# Patient Record
Sex: Female | Born: 1960 | Race: White | Hispanic: No | Marital: Married | Smoking: Former smoker
Health system: Southern US, Community
[De-identification: ages and names within clinical notes are randomized; demographics above are authoritative.]

## PROBLEM LIST (undated history)

## (undated) DIAGNOSIS — I10 Essential (primary) hypertension: Secondary | ICD-10-CM

## (undated) DIAGNOSIS — K5790 Diverticulosis of intestine, part unspecified, without perforation or abscess without bleeding: Secondary | ICD-10-CM

## (undated) DIAGNOSIS — K219 Gastro-esophageal reflux disease without esophagitis: Secondary | ICD-10-CM

## (undated) DIAGNOSIS — C439 Malignant melanoma of skin, unspecified: Secondary | ICD-10-CM

## (undated) DIAGNOSIS — Z9089 Acquired absence of other organs: Secondary | ICD-10-CM

## (undated) DIAGNOSIS — E785 Hyperlipidemia, unspecified: Secondary | ICD-10-CM

## (undated) DIAGNOSIS — E079 Disorder of thyroid, unspecified: Secondary | ICD-10-CM

## (undated) DIAGNOSIS — D649 Anemia, unspecified: Secondary | ICD-10-CM

## (undated) HISTORY — PX: KNEE ARTHROSCOPY W/ MENISCAL REPAIR: SHX1877

## (undated) HISTORY — DX: Essential (primary) hypertension: I10

## (undated) HISTORY — DX: Disorder of thyroid, unspecified: E07.9

## (undated) HISTORY — DX: Hemochromatosis, unspecified: E83.119

## (undated) HISTORY — DX: Anemia, unspecified: D64.9

## (undated) HISTORY — DX: Diverticulosis of intestine, part unspecified, without perforation or abscess without bleeding: K57.90

## (undated) HISTORY — DX: Acquired absence of other organs: Z90.89

## (undated) HISTORY — PX: TONSILLECTOMY AND ADENOIDECTOMY: SHX28

## (undated) HISTORY — DX: Gastro-esophageal reflux disease without esophagitis: K21.9

## (undated) HISTORY — DX: Malignant melanoma of skin, unspecified: C43.9

## (undated) HISTORY — DX: Hyperlipidemia, unspecified: E78.5

---

## 2002-04-01 ENCOUNTER — Encounter: Payer: Self-pay | Admitting: Gastroenterology

## 2002-04-01 ENCOUNTER — Ambulatory Visit (HOSPITAL_COMMUNITY): Admission: RE | Admit: 2002-04-01 | Discharge: 2002-04-01 | Payer: Self-pay | Admitting: Gastroenterology

## 2002-07-31 HISTORY — PX: BREAST BIOPSY: SHX20

## 2003-04-24 ENCOUNTER — Encounter (INDEPENDENT_AMBULATORY_CARE_PROVIDER_SITE_OTHER): Payer: Self-pay | Admitting: *Deleted

## 2003-04-24 ENCOUNTER — Ambulatory Visit (HOSPITAL_BASED_OUTPATIENT_CLINIC_OR_DEPARTMENT_OTHER): Admission: RE | Admit: 2003-04-24 | Discharge: 2003-04-24 | Payer: Self-pay | Admitting: Surgery

## 2003-04-24 ENCOUNTER — Ambulatory Visit (HOSPITAL_COMMUNITY): Admission: RE | Admit: 2003-04-24 | Discharge: 2003-04-24 | Payer: Self-pay | Admitting: Surgery

## 2004-08-12 ENCOUNTER — Encounter: Admission: RE | Admit: 2004-08-12 | Discharge: 2004-08-12 | Payer: Self-pay | Admitting: Family Medicine

## 2006-04-13 ENCOUNTER — Ambulatory Visit: Payer: Self-pay | Admitting: Hematology & Oncology

## 2006-04-23 LAB — CBC WITH DIFFERENTIAL/PLATELET
BASO%: 1.4 % (ref 0.0–2.0)
Eosinophils Absolute: 0.3 10*3/uL (ref 0.0–0.5)
LYMPH%: 28.7 % (ref 14.0–48.0)
MCHC: 33.6 g/dL (ref 32.0–36.0)
MCV: 79.7 fL — ABNORMAL LOW (ref 81.0–101.0)
MONO%: 8.6 % (ref 0.0–13.0)
NEUT%: 56.9 % (ref 39.6–76.8)
Platelets: 258 10*3/uL (ref 145–400)
RBC: 4.54 10*6/uL (ref 3.70–5.32)

## 2006-04-23 LAB — FERRITIN: Ferritin: 14 ng/mL (ref 10–291)

## 2006-04-23 LAB — IRON AND TIBC: %SAT: 13 % — ABNORMAL LOW (ref 20–55)

## 2006-07-17 ENCOUNTER — Ambulatory Visit: Payer: Self-pay | Admitting: Hematology & Oncology

## 2006-07-20 LAB — CBC WITH DIFFERENTIAL/PLATELET
Basophils Absolute: 0 10*3/uL (ref 0.0–0.1)
EOS%: 3.9 % (ref 0.0–7.0)
HCT: 36.5 % (ref 34.8–46.6)
HGB: 12.3 g/dL (ref 11.6–15.9)
MCH: 26 pg (ref 26.0–34.0)
MCV: 77.4 fL — ABNORMAL LOW (ref 81.0–101.0)
MONO%: 5.2 % (ref 0.0–13.0)
NEUT%: 63.6 % (ref 39.6–76.8)
Platelets: 216 10*3/uL (ref 145–400)

## 2006-10-23 ENCOUNTER — Ambulatory Visit: Payer: Self-pay | Admitting: Hematology & Oncology

## 2006-10-25 LAB — CBC WITH DIFFERENTIAL/PLATELET
BASO%: 0.4 % (ref 0.0–2.0)
Eosinophils Absolute: 0.5 10*3/uL (ref 0.0–0.5)
HCT: 36.1 % (ref 34.8–46.6)
LYMPH%: 30.3 % (ref 14.0–48.0)
MCHC: 34.1 g/dL (ref 32.0–36.0)
MONO#: 0.4 10*3/uL (ref 0.1–0.9)
NEUT#: 3.5 10*3/uL (ref 1.5–6.5)
NEUT%: 55.1 % (ref 39.6–76.8)
Platelets: 193 10*3/uL (ref 145–400)
RBC: 4.65 10*6/uL (ref 3.70–5.32)
WBC: 6.3 10*3/uL (ref 3.9–10.0)
lymph#: 1.9 10*3/uL (ref 0.9–3.3)

## 2006-10-25 LAB — FERRITIN: Ferritin: 12 ng/mL (ref 10–291)

## 2007-01-15 ENCOUNTER — Ambulatory Visit: Payer: Self-pay | Admitting: Hematology & Oncology

## 2007-01-18 LAB — CBC WITH DIFFERENTIAL/PLATELET
Basophils Absolute: 0 10*3/uL (ref 0.0–0.1)
Eosinophils Absolute: 0.5 10*3/uL (ref 0.0–0.5)
HCT: 34.3 % — ABNORMAL LOW (ref 34.8–46.6)
HGB: 11.7 g/dL (ref 11.6–15.9)
MCH: 26.3 pg (ref 26.0–34.0)
MONO#: 0.4 10*3/uL (ref 0.1–0.9)
NEUT#: 4.5 10*3/uL (ref 1.5–6.5)
NEUT%: 59 % (ref 39.6–76.8)
RDW: 15.3 % — ABNORMAL HIGH (ref 11.3–14.5)
lymph#: 2.2 10*3/uL (ref 0.9–3.3)

## 2007-04-23 ENCOUNTER — Ambulatory Visit: Payer: Self-pay | Admitting: Hematology & Oncology

## 2007-04-25 LAB — CBC WITH DIFFERENTIAL/PLATELET
BASO%: 0.5 % (ref 0.0–2.0)
HCT: 36.2 % (ref 34.8–46.6)
MCHC: 34.3 g/dL (ref 32.0–36.0)
MONO#: 0.3 10*3/uL (ref 0.1–0.9)
NEUT%: 59.4 % (ref 39.6–76.8)
RDW: 15.9 % — ABNORMAL HIGH (ref 11.3–14.5)
WBC: 7.9 10*3/uL (ref 3.9–10.0)
lymph#: 2.3 10*3/uL (ref 0.9–3.3)

## 2007-04-25 LAB — FERRITIN: Ferritin: 7 ng/mL — ABNORMAL LOW (ref 10–291)

## 2007-06-03 ENCOUNTER — Encounter: Payer: Self-pay | Admitting: Endocrinology

## 2007-08-20 ENCOUNTER — Ambulatory Visit: Payer: Self-pay | Admitting: Hematology & Oncology

## 2007-08-22 LAB — CBC WITH DIFFERENTIAL/PLATELET
Basophils Absolute: 0 10*3/uL (ref 0.0–0.1)
Eosinophils Absolute: 0.6 10*3/uL — ABNORMAL HIGH (ref 0.0–0.5)
HGB: 11.8 g/dL (ref 11.6–15.9)
MCV: 74.1 fL — ABNORMAL LOW (ref 81.0–101.0)
MONO#: 0.4 10*3/uL (ref 0.1–0.9)
NEUT#: 4.5 10*3/uL (ref 1.5–6.5)
RDW: 16.1 % — ABNORMAL HIGH (ref 11.3–14.5)
WBC: 8 10*3/uL (ref 3.9–10.0)
lymph#: 2.4 10*3/uL (ref 0.9–3.3)

## 2007-08-22 LAB — IRON AND TIBC
%SAT: 10 % — ABNORMAL LOW (ref 20–55)
Iron: 41 ug/dL — ABNORMAL LOW (ref 42–145)
TIBC: 406 ug/dL (ref 250–470)
UIBC: 365 ug/dL

## 2007-08-22 LAB — FERRITIN: Ferritin: 6 ng/mL — ABNORMAL LOW (ref 10–291)

## 2007-09-26 ENCOUNTER — Encounter: Payer: Self-pay | Admitting: Endocrinology

## 2007-10-03 ENCOUNTER — Encounter: Payer: Self-pay | Admitting: Endocrinology

## 2007-10-11 ENCOUNTER — Ambulatory Visit: Payer: Self-pay | Admitting: Endocrinology

## 2007-10-11 DIAGNOSIS — K219 Gastro-esophageal reflux disease without esophagitis: Secondary | ICD-10-CM | POA: Insufficient documentation

## 2007-10-11 DIAGNOSIS — I10 Essential (primary) hypertension: Secondary | ICD-10-CM | POA: Insufficient documentation

## 2007-10-11 DIAGNOSIS — J42 Unspecified chronic bronchitis: Secondary | ICD-10-CM | POA: Insufficient documentation

## 2007-10-11 DIAGNOSIS — Z9089 Acquired absence of other organs: Secondary | ICD-10-CM | POA: Insufficient documentation

## 2007-10-11 DIAGNOSIS — N390 Urinary tract infection, site not specified: Secondary | ICD-10-CM | POA: Insufficient documentation

## 2007-10-11 DIAGNOSIS — J309 Allergic rhinitis, unspecified: Secondary | ICD-10-CM | POA: Insufficient documentation

## 2007-10-11 DIAGNOSIS — D649 Anemia, unspecified: Secondary | ICD-10-CM | POA: Insufficient documentation

## 2007-10-11 DIAGNOSIS — E785 Hyperlipidemia, unspecified: Secondary | ICD-10-CM | POA: Insufficient documentation

## 2007-10-22 ENCOUNTER — Encounter: Admission: RE | Admit: 2007-10-22 | Discharge: 2007-10-22 | Payer: Self-pay | Admitting: Endocrinology

## 2007-10-25 ENCOUNTER — Telehealth (INDEPENDENT_AMBULATORY_CARE_PROVIDER_SITE_OTHER): Payer: Self-pay | Admitting: *Deleted

## 2007-10-28 ENCOUNTER — Telehealth (INDEPENDENT_AMBULATORY_CARE_PROVIDER_SITE_OTHER): Payer: Self-pay | Admitting: *Deleted

## 2007-10-30 ENCOUNTER — Ambulatory Visit: Payer: Self-pay | Admitting: Endocrinology

## 2007-11-06 ENCOUNTER — Encounter: Admission: RE | Admit: 2007-11-06 | Discharge: 2007-11-06 | Payer: Self-pay | Admitting: Endocrinology

## 2007-11-09 ENCOUNTER — Emergency Department (HOSPITAL_COMMUNITY): Admission: EM | Admit: 2007-11-09 | Discharge: 2007-11-09 | Payer: Self-pay | Admitting: Emergency Medicine

## 2007-11-19 ENCOUNTER — Telehealth (INDEPENDENT_AMBULATORY_CARE_PROVIDER_SITE_OTHER): Payer: Self-pay | Admitting: *Deleted

## 2007-12-19 ENCOUNTER — Ambulatory Visit: Payer: Self-pay | Admitting: Endocrinology

## 2007-12-19 LAB — CONVERTED CEMR LAB: TSH: 0.04 microintl units/mL — ABNORMAL LOW (ref 0.35–5.50)

## 2008-01-17 ENCOUNTER — Ambulatory Visit: Payer: Self-pay | Admitting: Endocrinology

## 2008-01-21 LAB — CONVERTED CEMR LAB
Free T4: 0.4 ng/dL — ABNORMAL LOW (ref 0.6–1.6)
TSH: 17.99 microintl units/mL — ABNORMAL HIGH (ref 0.35–5.50)

## 2008-01-22 ENCOUNTER — Encounter: Payer: Self-pay | Admitting: Endocrinology

## 2008-01-23 ENCOUNTER — Ambulatory Visit: Payer: Self-pay | Admitting: Hematology & Oncology

## 2008-01-27 LAB — CBC WITH DIFFERENTIAL/PLATELET
Eosinophils Absolute: 0.5 10*3/uL (ref 0.0–0.5)
HCT: 32.8 % — ABNORMAL LOW (ref 34.8–46.6)
LYMPH%: 27.9 % (ref 14.0–48.0)
MCHC: 33.1 g/dL (ref 32.0–36.0)
MONO#: 0.3 10*3/uL (ref 0.1–0.9)
NEUT#: 3.5 10*3/uL (ref 1.5–6.5)
NEUT%: 57.6 % (ref 39.6–76.8)
Platelets: 255 10*3/uL (ref 145–400)
WBC: 6.1 10*3/uL (ref 3.9–10.0)

## 2008-02-17 ENCOUNTER — Ambulatory Visit: Payer: Self-pay | Admitting: Endocrinology

## 2008-02-17 DIAGNOSIS — E039 Hypothyroidism, unspecified: Secondary | ICD-10-CM | POA: Insufficient documentation

## 2008-02-17 LAB — CONVERTED CEMR LAB: TSH: 2.43 microintl units/mL (ref 0.35–5.50)

## 2008-04-02 ENCOUNTER — Ambulatory Visit: Payer: Self-pay | Admitting: Endocrinology

## 2008-04-02 LAB — CONVERTED CEMR LAB: TSH: 0.38 microintl units/mL (ref 0.35–5.50)

## 2008-06-02 ENCOUNTER — Encounter: Payer: Self-pay | Admitting: Endocrinology

## 2008-07-03 ENCOUNTER — Ambulatory Visit: Payer: Self-pay | Admitting: Hematology & Oncology

## 2008-07-06 LAB — CBC WITH DIFFERENTIAL (CANCER CENTER ONLY)
BASO#: 0 10*3/uL (ref 0.0–0.2)
EOS%: 6.1 % (ref 0.0–7.0)
HCT: 32.3 % — ABNORMAL LOW (ref 34.8–46.6)
HGB: 10.6 g/dL — ABNORMAL LOW (ref 11.6–15.9)
LYMPH#: 2 10*3/uL (ref 0.9–3.3)
MCH: 23.6 pg — ABNORMAL LOW (ref 26.0–34.0)
MCHC: 32.9 g/dL (ref 32.0–36.0)
MCV: 72 fL — ABNORMAL LOW (ref 81–101)
NEUT%: 54.5 % (ref 39.6–80.0)

## 2008-07-06 LAB — FERRITIN: Ferritin: 4 ng/mL — ABNORMAL LOW (ref 10–291)

## 2008-07-27 ENCOUNTER — Encounter: Payer: Self-pay | Admitting: Endocrinology

## 2008-10-05 ENCOUNTER — Encounter: Payer: Self-pay | Admitting: Endocrinology

## 2008-11-09 ENCOUNTER — Ambulatory Visit (HOSPITAL_BASED_OUTPATIENT_CLINIC_OR_DEPARTMENT_OTHER): Admission: RE | Admit: 2008-11-09 | Discharge: 2008-11-09 | Payer: Self-pay | Admitting: Family Medicine

## 2008-11-14 ENCOUNTER — Ambulatory Visit: Payer: Self-pay | Admitting: Internal Medicine

## 2009-01-01 ENCOUNTER — Ambulatory Visit: Payer: Self-pay | Admitting: Hematology & Oncology

## 2009-01-04 LAB — RETICULOCYTES (CHCC)
ABS Retic: 42.5 10*3/uL (ref 19.0–186.0)
Retic Ct Pct: 1 % (ref 0.4–3.1)

## 2009-01-04 LAB — CBC WITH DIFFERENTIAL (CANCER CENTER ONLY)
BASO#: 0 10*3/uL (ref 0.0–0.2)
Eosinophils Absolute: 0.3 10*3/uL (ref 0.0–0.5)
HCT: 29.9 % — ABNORMAL LOW (ref 34.8–46.6)
HGB: 9.5 g/dL — ABNORMAL LOW (ref 11.6–15.9)
LYMPH#: 1.6 10*3/uL (ref 0.9–3.3)
MONO#: 0.2 10*3/uL (ref 0.1–0.9)
NEUT%: 56.7 % (ref 39.6–80.0)
WBC: 5.1 10*3/uL (ref 3.9–10.0)

## 2009-01-04 LAB — CHCC SATELLITE - SMEAR

## 2009-01-04 LAB — FERRITIN: Ferritin: 2 ng/mL — ABNORMAL LOW (ref 10–291)

## 2009-01-17 IMAGING — CR DG NECK SOFT TISSUE
2 series · 2 of 2 positions shown · non-contrast
Comparison: None.

CLINICAL DATA: Sore throat.

NECK SOFT TISSUES - 3+ VIEW

[w soft tissue neck]
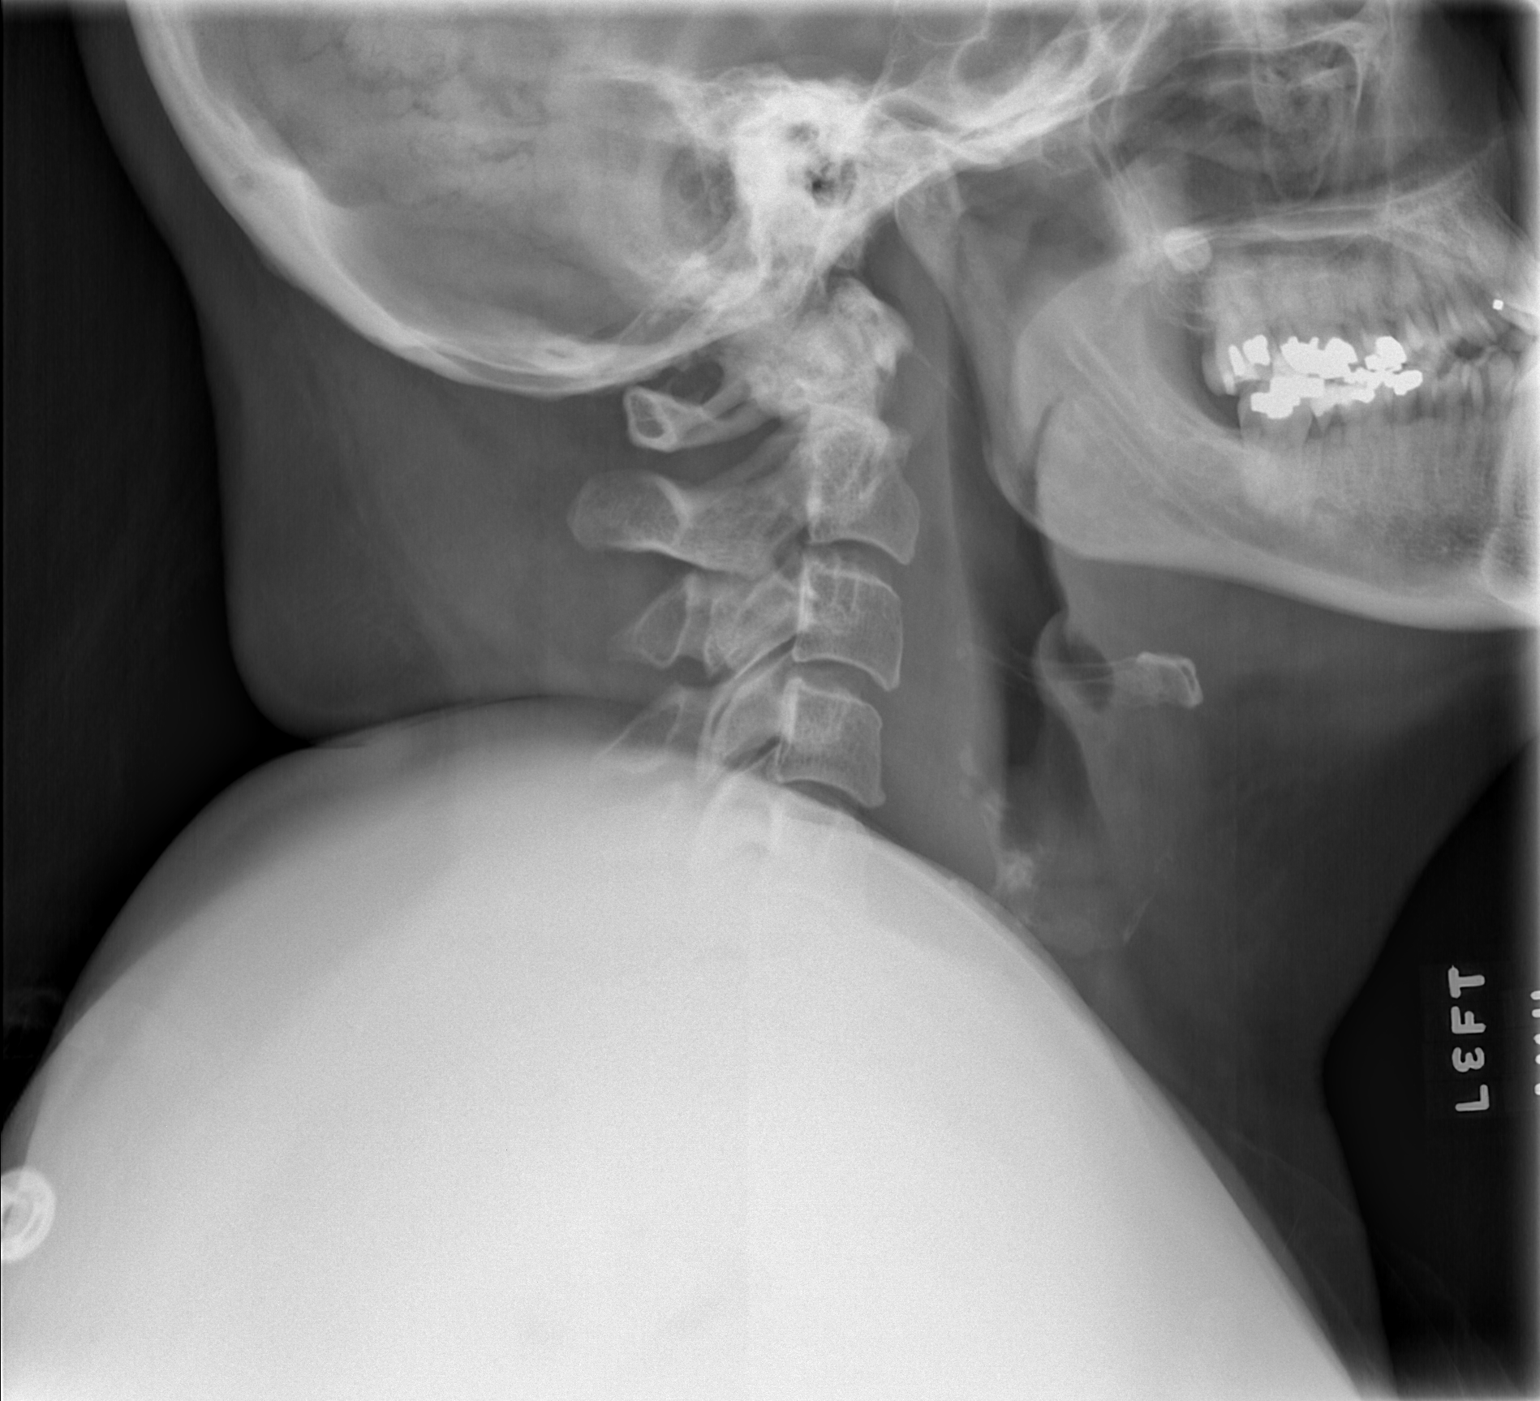

[w soft tissue neck ap]
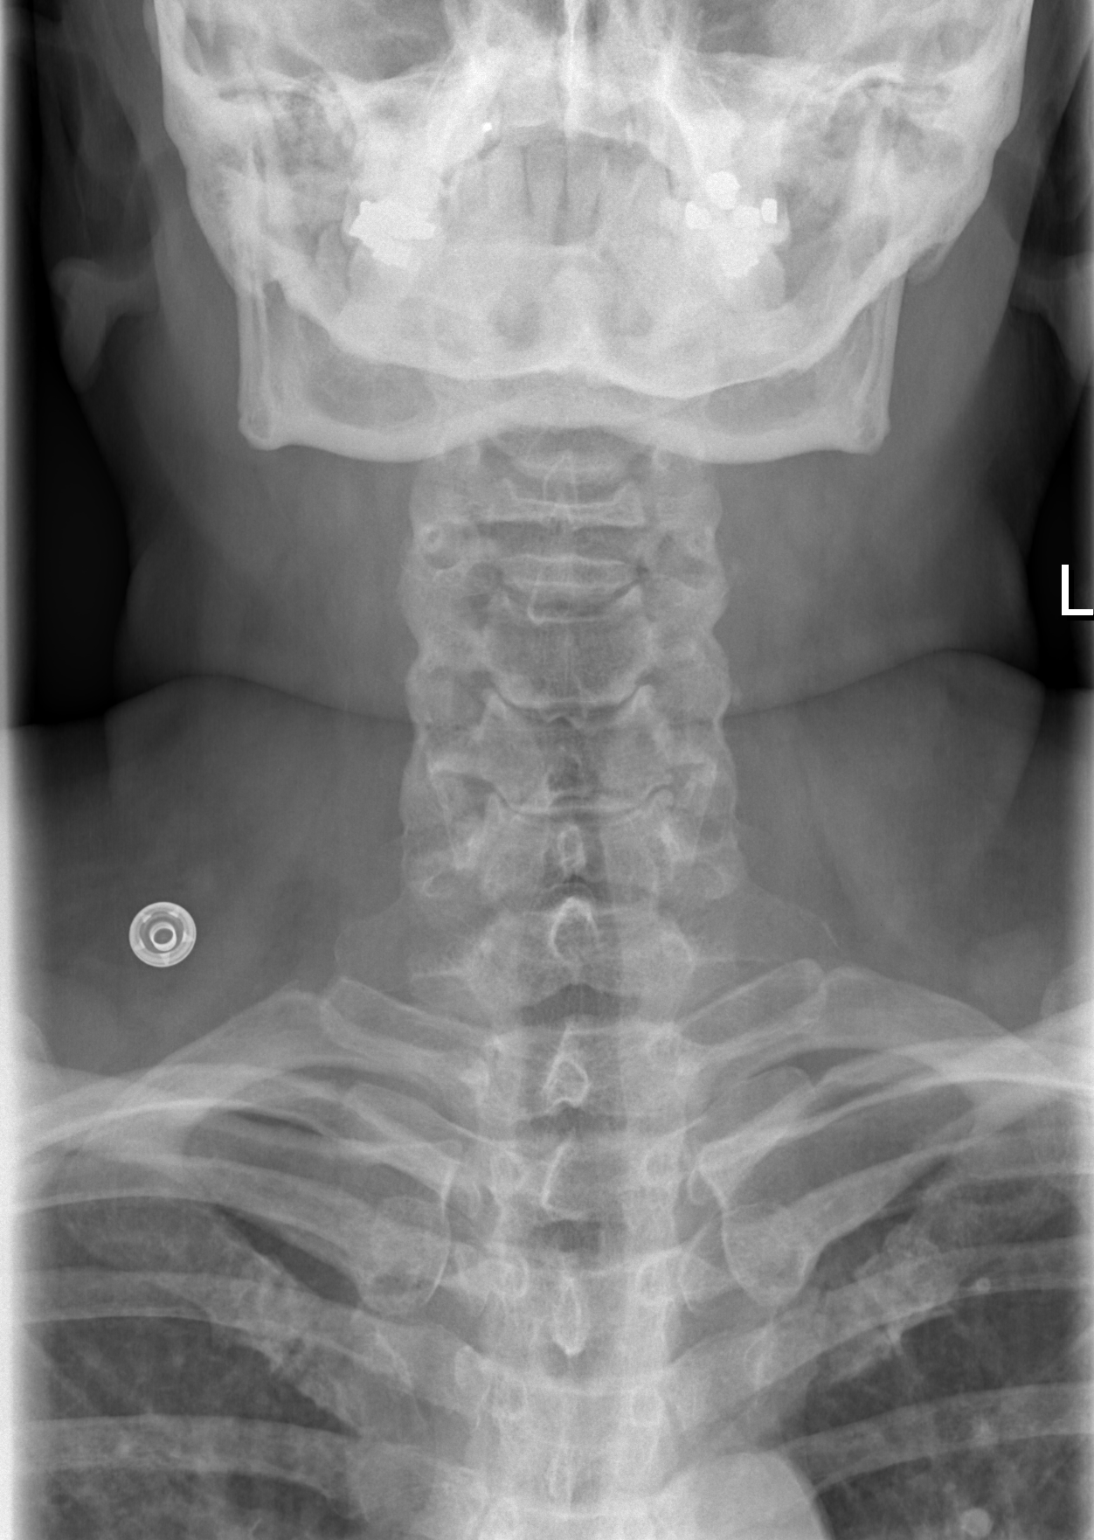

[2 of 2 positions shown; findings below may reference images not displayed]

FINDINGS: Two view exam of the neck using soft tissue technique
shows apparent prevertebral soft tissue swelling.  Although the
patient's large body habitus and shoulders obscure the proximal
trachea, and there does appear to be loss of the normal step off
usually seen at the level of the proximal esophagus.  The
epiglottis and aryepiglottic folds are unremarkable.
IMPRESSION: Apparent prevertebral soft tissue swelling at about the C5-6 level.
Correlate clinically consider CT scan with contrast to further
evaluate.

## 2009-03-04 ENCOUNTER — Ambulatory Visit: Payer: Self-pay | Admitting: Hematology & Oncology

## 2009-03-08 LAB — RETICULOCYTES (CHCC)
ABS Retic: 66.9 10*3/uL (ref 19.0–186.0)
RBC.: 4.78 MIL/uL (ref 3.87–5.11)
Retic Ct Pct: 1.4 % (ref 0.4–3.1)

## 2009-03-08 LAB — CBC WITH DIFFERENTIAL (CANCER CENTER ONLY)
BASO%: 0.4 % (ref 0.0–2.0)
Eosinophils Absolute: 0.3 10*3/uL (ref 0.0–0.5)
HCT: 34.5 % — ABNORMAL LOW (ref 34.8–46.6)
LYMPH#: 1.7 10*3/uL (ref 0.9–3.3)
MONO#: 0.3 10*3/uL (ref 0.1–0.9)
Platelets: 218 10*3/uL (ref 145–400)
RBC: 4.73 10*6/uL (ref 3.70–5.32)
RDW: 19.4 % — ABNORMAL HIGH (ref 10.5–14.6)
WBC: 7.3 10*3/uL (ref 3.9–10.0)

## 2009-03-08 LAB — CHCC SATELLITE - SMEAR

## 2009-07-06 ENCOUNTER — Ambulatory Visit: Payer: Self-pay | Admitting: Hematology & Oncology

## 2009-07-07 LAB — CBC WITH DIFFERENTIAL (CANCER CENTER ONLY)
BASO%: 0.7 % (ref 0.0–2.0)
EOS%: 4.2 % (ref 0.0–7.0)
LYMPH#: 2 10*3/uL (ref 0.9–3.3)
MCHC: 34.2 g/dL (ref 32.0–36.0)
MONO#: 0.3 10*3/uL (ref 0.1–0.9)
Platelets: 193 10*3/uL (ref 145–400)
RDW: 16.3 % — ABNORMAL HIGH (ref 10.5–14.6)
WBC: 7.2 10*3/uL (ref 3.9–10.0)

## 2009-07-07 LAB — FERRITIN: Ferritin: 7 ng/mL — ABNORMAL LOW (ref 10–291)

## 2010-01-03 ENCOUNTER — Ambulatory Visit: Payer: Self-pay | Admitting: Hematology & Oncology

## 2010-01-05 LAB — CBC WITH DIFFERENTIAL (CANCER CENTER ONLY)
BASO%: 0.7 % (ref 0.0–2.0)
EOS%: 6.5 % (ref 0.0–7.0)
LYMPH#: 2.2 10*3/uL (ref 0.9–3.3)
MCHC: 33.9 g/dL (ref 32.0–36.0)
MONO#: 0.3 10*3/uL (ref 0.1–0.9)
NEUT#: 4.4 10*3/uL (ref 1.5–6.5)
NEUT%: 59.5 % (ref 39.6–80.0)
Platelets: 222 10*3/uL (ref 145–400)
RDW: 10.7 % (ref 10.5–14.6)
WBC: 7.4 10*3/uL (ref 3.9–10.0)

## 2010-01-05 LAB — HEPATIC FUNCTION PANEL
Bilirubin, Direct: 0.1 mg/dL (ref 0.0–0.3)
Indirect Bilirubin: 0.4 mg/dL (ref 0.0–0.9)
Total Bilirubin: 0.5 mg/dL (ref 0.3–1.2)
Total Protein: 7.9 g/dL (ref 6.0–8.3)

## 2010-07-12 ENCOUNTER — Ambulatory Visit: Payer: Self-pay | Admitting: Hematology & Oncology

## 2010-07-13 LAB — CBC WITH DIFFERENTIAL (CANCER CENTER ONLY)
BASO%: 0.4 % (ref 0.0–2.0)
Eosinophils Absolute: 0.5 10*3/uL (ref 0.0–0.5)
HCT: 40.8 % (ref 34.8–46.6)
LYMPH#: 2 10*3/uL (ref 0.9–3.3)
MONO#: 0.4 10*3/uL (ref 0.1–0.9)
NEUT%: 61.6 % (ref 39.6–80.0)
RBC: 4.77 10*6/uL (ref 3.70–5.32)
RDW: 13.5 % (ref 10.5–14.6)
WBC: 7.6 10*3/uL (ref 3.9–10.0)

## 2010-07-13 LAB — CHCC SATELLITE - SMEAR

## 2010-07-13 LAB — IRON AND TIBC: TIBC: 467 ug/dL (ref 250–470)

## 2010-07-13 LAB — RETICULOCYTES (CHCC)
ABS Retic: 65.2 10*3/uL (ref 19.0–186.0)
Retic Ct Pct: 1.4 % (ref 0.4–3.1)

## 2010-07-13 LAB — FERRITIN: Ferritin: 8 ng/mL — ABNORMAL LOW (ref 10–291)

## 2010-11-29 ENCOUNTER — Encounter: Payer: Self-pay | Admitting: Family Medicine

## 2010-12-16 NOTE — Op Note (Signed)
   [name], Sarah Garrett                       ACCOUNT NO.:  [id number]   MEDICAL RECORD NO.:  [id number]                   PATIENT TYPE:  AMB   LOCATION:  DSC                                  FACILITY:  [facility]   PHYSICIAN:  Sandria Bales. Ezzard Standing, M.D.               DATE OF BIRTH:  [birth year]   DATE OF PROCEDURE:  04/24/2003  DATE OF DISCHARGE:                                 OPERATIVE REPORT   PREOPERATIVE DIAGNOSIS:  A 1-cm lesion, left breast at the 11 o'clock  position.   POSTOPERATIVE DIAGNOSIS:  A 1-cm lesion, left breast at the 11 o'clock  position.   PROCEDURE:  Excision of skin lesion, left breast.   SURGEON:  Sandria Bales. Ezzard Standing, M.D.   ANESTHESIA:  6 mL 1% Xylocaine.   COMPLICATIONS:  None.   INDICATIONS FOR PROCEDURE:  [name] is a 50 year old white female who  has a knot  at about the 11 to 12 o'clock position on the left breast. It  has enlarged and gotten smaller, enlarged and gotten smaller. It looks like  a cyst of the skin which has become recurrently inflamed.   DESCRIPTION OF PROCEDURE:  With the patient in the supine position, her left  breast was prepared with Betadine solution and sterilely draped. I marked  the lesion  before infiltrating it. I then excised  an area about 1 cm of  the left breast.   I then closed it with 4-0 nylon sutures. I put a sterile gauze 4 x 4 and  sterile dressing on it.   She tolerated the procedure well. Final pathology is pending at the time of  this dictation.                                               Sandria Bales. Ezzard Standing, M.D.    DHN/MEDQ  D:  04/24/2003  T:  04/26/2003  Job:  [id number]   cc:   Birdena Jubilee, M.D.  [facility]

## 2010-12-16 NOTE — Procedures (Signed)
Sarah Garrett, Sarah Garrett             ACCOUNT NO.:  [id number]   MEDICAL RECORD NO.:  [id number]          PATIENT TYPE:  OUT   LOCATION:  SLEEP CENTER                 FACILITY:  [facility]   PHYSICIAN:  Clinton D. Maple Hudson, MD, FCCP, FACPDATE OF BIRTH:  Sep 01, 1960   DATE OF STUDY:                            NOCTURNAL POLYSOMNOGRAM   REFERRING PHYSICIAN:  Ernestina Penna, M.D.   REFERRING PHYSICIAN:  Ernestina Penna, MD   INDICATION FOR STUDY:  Hypersomnia with sleep apnea.   EPWORTH SLEEPINESS SCORE:  13/24, BMI 34.5.  Weight 195 pounds, height  63 inches, neck 15 inches.   MEDICATIONS:  Home medications are charted and reviewed.   SLEEP ARCHITECTURE:  Total sleep time 389 minutes with sleep efficiency  92.6%.  Stage I was 2.8%, stage II 54.5%, stage III 28.5%, REM 14.1% of  total sleep time.  Sleep latency 13 minutes, REM latency 181.5 minutes.  Awake after sleep onset 18 minutes.  Arousal index 12.3.  Bedtime  medication included nitrofurantoin and Lovaza.   RESPIRATORY DATA:  Apnea/hypopnea index (AHI) 2.6 per hour.  A total of  17 events was scored including 2 central apnea and 15 hypopnea.  All  events were associated with supine sleep position.  REM AHI 15.3 per  hour.  There are insufficient respiratory events to permit CPAP  titration by split protocol on the study night.   OXYGEN DATA:  Mild-to-moderate snoring with oxygen desaturation to a  nadir of 87%.  Mean oxygen saturation through the study was 94.4% on  room air.   CARDIAC DATA:  Normal sinus rhythm.   MOVEMENT-PARASOMNIA:  No significant movement disturbance.  No bathroom  trips.   IMPRESSIONS-RECOMMENDATIONS:  1. Sleep architecture was marked by frequent sleep stage changes and      alpha intrusion with unremarkable arousal summary.  There may be      mild EEG restlessness, nonspecific.  2. Occasional respiratory events with sleep disturbance, within normal      limits, AHI 2.6 per hour (normal range 0-5 per  hour).  Mild-to-      moderate snoring with oxygen desaturation to a nadir of 87%.      Clinton D. Maple Hudson, MD, [facility], FACP  Diplomate, Biomedical engineer of Sleep Medicine  Electronically Signed     CDY/MEDQ  D:  11/14/2008 10:41:30  T:  11/14/2008 21:32:10  Job:  [id number]

## 2011-01-04 ENCOUNTER — Other Ambulatory Visit: Payer: Self-pay | Admitting: Hematology & Oncology

## 2011-01-04 ENCOUNTER — Encounter (HOSPITAL_BASED_OUTPATIENT_CLINIC_OR_DEPARTMENT_OTHER): Payer: BC Managed Care – PPO | Admitting: Hematology & Oncology

## 2011-01-04 LAB — IRON AND TIBC
%SAT: 28 % (ref 20–55)
TIBC: 429 ug/dL (ref 250–470)
UIBC: 311 ug/dL

## 2011-01-04 LAB — CBC WITH DIFFERENTIAL (CANCER CENTER ONLY)
BASO#: 0 10*3/uL (ref 0.0–0.2)
EOS%: 4.1 % (ref 0.0–7.0)
HCT: 38.8 % (ref 34.8–46.6)
HGB: 13.1 g/dL (ref 11.6–15.9)
LYMPH%: 34.3 % (ref 14.0–48.0)
MCH: 28 pg (ref 26.0–34.0)
MCHC: 33.8 g/dL (ref 32.0–36.0)
MONO%: 5.5 % (ref 0.0–13.0)
NEUT%: 55.6 % (ref 39.6–80.0)

## 2011-04-25 LAB — CBC
HCT: 33.7 — ABNORMAL LOW
MCV: 73.6 — ABNORMAL LOW
Platelets: 242
RDW: 17.3 — ABNORMAL HIGH

## 2011-04-25 LAB — POCT I-STAT, CHEM 8
Calcium, Ion: 1.1 — ABNORMAL LOW
Chloride: 107
Glucose, Bld: 92
HCT: 34 — ABNORMAL LOW

## 2011-07-07 ENCOUNTER — Other Ambulatory Visit: Payer: Self-pay | Admitting: Hematology & Oncology

## 2011-07-07 ENCOUNTER — Other Ambulatory Visit (HOSPITAL_BASED_OUTPATIENT_CLINIC_OR_DEPARTMENT_OTHER): Payer: BC Managed Care – PPO | Admitting: Lab

## 2011-07-07 ENCOUNTER — Encounter: Payer: Self-pay | Admitting: Hematology & Oncology

## 2011-07-07 ENCOUNTER — Ambulatory Visit (HOSPITAL_BASED_OUTPATIENT_CLINIC_OR_DEPARTMENT_OTHER): Payer: BC Managed Care – PPO | Admitting: Hematology & Oncology

## 2011-07-07 ENCOUNTER — Telehealth: Payer: Self-pay | Admitting: *Deleted

## 2011-07-07 DIAGNOSIS — L989 Disorder of the skin and subcutaneous tissue, unspecified: Secondary | ICD-10-CM

## 2011-07-07 HISTORY — DX: Hemochromatosis, unspecified: E83.119

## 2011-07-07 LAB — RETICULOCYTES (CHCC)
RBC.: 4.75 MIL/uL (ref 3.87–5.11)
Retic Ct Pct: 1.6 % (ref 0.4–2.3)
Retic Ct Pct: 1.6 % (ref 0.4–2.3)

## 2011-07-07 LAB — COMPREHENSIVE METABOLIC PANEL
Albumin: 4.3 g/dL (ref 3.5–5.2)
Albumin: 4.3 g/dL (ref 3.5–5.2)
Albumin: 4.3 g/dL (ref 3.5–5.2)
Alkaline Phosphatase: 58 U/L (ref 39–117)
Alkaline Phosphatase: 58 U/L (ref 39–117)
BUN: 12 mg/dL (ref 6–23)
BUN: 12 mg/dL (ref 6–23)
CO2: 26 mEq/L (ref 19–32)
Calcium: 9.2 mg/dL (ref 8.4–10.5)
Chloride: 101 mEq/L (ref 96–112)
Creatinine, Ser: 0.78 mg/dL (ref 0.50–1.10)
Creatinine, Ser: 0.78 mg/dL (ref 0.50–1.10)
Glucose, Bld: 168 mg/dL — ABNORMAL HIGH (ref 70–99)
Glucose, Bld: 168 mg/dL — ABNORMAL HIGH (ref 70–99)
Glucose, Bld: 168 mg/dL — ABNORMAL HIGH (ref 70–99)
Potassium: 2.9 mEq/L — ABNORMAL LOW (ref 3.5–5.3)
Potassium: 2.9 mEq/L — ABNORMAL LOW (ref 3.5–5.3)
Sodium: 139 mEq/L (ref 135–145)
Total Bilirubin: 0.4 mg/dL (ref 0.3–1.2)
Total Protein: 6.8 g/dL (ref 6.0–8.3)

## 2011-07-07 LAB — CBC WITH DIFFERENTIAL (CANCER CENTER ONLY)
BASO#: 0 10*3/uL (ref 0.0–0.2)
Eosinophils Absolute: 0.4 10*3/uL (ref 0.0–0.5)
LYMPH%: 31.4 % (ref 14.0–48.0)
MCH: 29.3 pg (ref 26.0–34.0)
MCV: 85 fL (ref 81–101)
MONO%: 5 % (ref 0.0–13.0)
NEUT%: 56.9 % (ref 39.6–80.0)
Platelets: 170 10*3/uL (ref 145–400)
RBC: 4.74 10*6/uL (ref 3.70–5.32)

## 2011-07-07 LAB — IRON AND TIBC
%SAT: 20 % (ref 20–55)
%SAT: 20 % (ref 20–55)
Iron: 96 ug/dL (ref 42–145)
Iron: 96 ug/dL (ref 42–145)
TIBC: 473 ug/dL — ABNORMAL HIGH (ref 250–470)
TIBC: 473 ug/dL — ABNORMAL HIGH (ref 250–470)
TIBC: 473 ug/dL — ABNORMAL HIGH (ref 250–470)
UIBC: 377 ug/dL (ref 125–400)
UIBC: 377 ug/dL (ref 125–400)
UIBC: 377 ug/dL (ref 125–400)

## 2011-07-07 LAB — CHCC SATELLITE - SMEAR

## 2011-07-07 LAB — AFP TUMOR MARKER
AFP-Tumor Marker: 1.5 ng/mL (ref 0.0–8.0)
AFP-Tumor Marker: 1.5 ng/mL (ref 0.0–8.0)

## 2011-07-07 NOTE — Telephone Encounter (Signed)
Mailed June schedule °

## 2011-07-07 NOTE — Progress Notes (Signed)
CC:   Sarah Garrett, M.D.  DIAGNOSIS:  Hemochromatosis, double heterozygote.  CURRENT THERAPY:  Phlebotomy to maintain a ferritin less than 100.  INTERIM HISTORY:  [name] comes in for followup.  She is doing okay. Her daughter is now off at [employer].  [name] has made the adjustment quite nicely.  [name] has had no complaints.  She has noted a dark spot on her right shoulder.  This is on the lateral part of the right shoulder.  She does have fair skin.  We are probably going to need to have this looked at by Dermatology.  I will call Dr. Dorita Sciara office.  When we last saw her in June, her ferritin was 19.  Since we have been seeing her, we have not had to phlebotomize her, which is nice.  She still has her monthly cycles, which is very helpful for her hemochromatosis.  PHYSICAL EXAMINATION:  General Appearance:  This is a well-developed, well-nourished, white female in no obvious distress.  Vital Signs: 98.1, pulse 70, respiratory rate 20, blood pressure 115/80.  Weight is 195.  Head and Neck Exam:  A normocephalic, atraumatic skull.  There are no ocular or oral lesions.  There are no palpable cervical or supraclavicular lymph nodes.  Lungs:  Clear bilaterally.  Cardiac Exam: Regular rate and rhythm with a normal S1 and S2.  There are no murmurs, rubs, or bruits.  Abdominal Exam:  Soft with good bowel sounds.  There is no palpable abdominal mass.  There is no fluid wave.  There is no palpable hepatosplenomegaly.  Back Exam:  No tenderness over the spine, ribs, or hips.  Extremities:  No clubbing, cyanosis, or edema.  Skin Exam:  A hyperpigmented lesion on the right shoulder.  It is slightly irregular.  It probably measures about 6-7 mm in diameter.  It does appear darker in the center.  LABORATORY STUDIES:  White cell count is 6.3, hemoglobin 14, hematocrit 40, and platelet count 170.  MCV is 85.  IMPRESSION:  [name] is a 50 year old,  white female with hemochromatosis.  Again, she is heterozygous for 2 different genetic mutations.  I do not see any problems with the hemochromatosis right now.  We will followup in 6 months to reassess her iron studies.  Again, I want to have this lesion on her right shoulder looked at by Dr. Terri Piedra.  We will make sure that she has an appointment with him.    ______________________________ Josph Macho, M.D. PRE/MEDQ  D:  07/07/2011  T:  07/07/2011  Job:  [id number]  ADDENDUM:  Ferritin is 20. K+ is 2.9.

## 2011-07-07 NOTE — Progress Notes (Signed)
This office note has been dictated.

## 2011-07-10 ENCOUNTER — Other Ambulatory Visit: Payer: Self-pay | Admitting: *Deleted

## 2011-07-10 MED ORDER — POTASSIUM CHLORIDE CRYS ER 20 MEQ PO TBCR: 40.0000 meq | EXTENDED_RELEASE_TABLET | Freq: Every day | ORAL | Status: DC

## 2011-07-10 NOTE — Telephone Encounter (Signed)
Called patient to let her know that her K levels were low per dr. Myna Hidalgo.  Wants patient to be on KDur x 7 days then recheck with family dr.  Jeanene Erb this to patient

## 2011-08-09 ENCOUNTER — Other Ambulatory Visit: Payer: Self-pay | Admitting: Dermatology

## 2011-08-22 ENCOUNTER — Other Ambulatory Visit: Payer: Self-pay

## 2011-09-07 ENCOUNTER — Ambulatory Visit (INDEPENDENT_AMBULATORY_CARE_PROVIDER_SITE_OTHER): Payer: BC Managed Care – PPO | Admitting: Surgery

## 2011-09-07 ENCOUNTER — Encounter (INDEPENDENT_AMBULATORY_CARE_PROVIDER_SITE_OTHER): Payer: Self-pay | Admitting: Surgery

## 2011-09-07 VITALS — BP 126/83 | HR 78 | Temp 97.4°F | Resp 18 | Ht 63.0 in | Wt 194.4 lb

## 2011-09-07 DIAGNOSIS — C4360 Malignant melanoma of unspecified upper limb, including shoulder: Secondary | ICD-10-CM | POA: Insufficient documentation

## 2011-09-07 NOTE — Progress Notes (Addendum)
Re:   Sarah Garrett DOB:   1961-02-19 MRN:   [id number]  ASSESSMENT AND PLAN: 1.  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  Needs wider excision. I do not think she needs a SLNBx.  Discussed the indication and complication of surgery with the patient.  The risks include, but are not limited to, bleeding, infection, nerve injury, and recurrence of the tumor.  Will schedule this at a time convenient with her.  2.  HTN. 3.  GERD. 4.  Hemochromatosis.  Followed by Dr. Ross Marcus.  Right now she is just being observed.  Chief Complaint  Patient presents with  . Melanoma    Right upper arm   REFERRING PHYSICIAN:   Dr. Alinda Sierras  HISTORY OF PRESENT ILLNESS: Sarah Garrett is a 51 y.o. (DOB: [birth year])  white female whose primary care physician is Rudi Heap, MD, MD and comes to me today for right arm melanoma. She has her husband with her.  She had noticed a mole on her right shoulder/upper arm for some time.  Some friends had said something about it. She has no hx of melanoma or other skin lesion.  Though she does know that her red hair/light skin sets her up for skin cancers.   Dr. Myna Hidalgo referred to Dr. Terri Piedra who did a biopsy of the lesion.  The biopsy on 08/09/2011 showed a 0.35 mm Breslow, Clark level 2 melanoma.  She come for discussion of further treatment.   Past Medical History  Diagnosis Date  . Hemochromatosis 07/07/2011  . Hyperlipidemia   . Hypertension   . Arthritis   . GERD (gastroesophageal reflux disease)   . Anemia   . Thyroid disease   . H/O adenoidectomy       Past Surgical History  Procedure Date  . Tonsillectomy and adenoidectomy   . Cystectomy       Current Outpatient Prescriptions  Medication Sig Dispense Refill  . albuterol (PROVENTIL HFA;VENTOLIN HFA) 108 (90 BASE) MCG/ACT inhaler Inhale 2 puffs into the lungs every 6 (six) hours as needed.        Marland Kitchen atorvastatin (LIPITOR) 40 MG tablet  Take 40 mg by mouth at bedtime.        . cetirizine (ZYRTEC) 10 MG tablet Take 10 mg by mouth daily.        . Fluticasone-Salmeterol (ADVAIR DISKUS) 100-50 MCG/DOSE AEPB Inhale 1 puff into the lungs. As needed         . furosemide (LASIX) 40 MG tablet Take 20 mg by mouth Daily.      Marland Kitchen HYDROcodone-acetaminophen (NORCO) 5-325 MG per tablet       . hyoscyamine (HYOMAX-SL) 0.125 MG SL tablet Place 0.125 mg under the tongue as needed. Place 1-2 tablet under the tongue until it dissolves as needed      . KLOR-CON 10 10 MEQ tablet       . levothyroxine (SYNTHROID, LEVOTHROID) 125 MCG tablet Take 125 mcg by mouth Daily.      Marland Kitchen LOVAZA 1 G capsule       . MICROGESTIN FE 1/20 1-20 MG-MCG tablet Take by mouth Daily.      . mometasone-formoterol (DULERA) 100-5 MCG/ACT AERO Inhale 2 puffs into the lungs 2 (two) times daily.        . montelukast (SINGULAIR) 10 MG tablet Take 10 mg by mouth at bedtime.        Marland Kitchen olmesartan-hydrochlorothiazide (  BENICAR HCT) 40-12.5 MG per tablet Take 1 tablet by mouth daily. Take half tablet by mouth       . potassium chloride SA (K-DUR,KLOR-CON) 20 MEQ tablet Take 2 tablets (40 mEq total) by mouth daily.  14 tablet  0      Allergies  Allergen Reactions  . Ace Inhibitors   . Actos (Pioglitazone Hydrochloride)   . Aspirin   . Crestor (Rosuvastatin Calcium)   . Sulfonamide Derivatives     REVIEW OF SYSTEMS: Skin:  See HPI. Infection:  No history of hepatitis or HIV.  No history of MRSA. Neurologic:  No history of stroke.  No history of seizure.  No history of headaches. Cardiac:  No history of hypertension. No history of heart disease.  No history of prior cardiac catheterization.  No history of seeing a cardiologist. Pulmonary:  Asthma, year around.  Endocrine:  No diabetes. History of radioactive iodine ablation of thyroid.  Now on replacement therapy.. Gastrointestinal:  GERD.  No history of liver disease.  No history of gall bladder disease.  No history of  pancreas disease.  No history of colon disease. Urologic:  No history of kidney stones.  No history of bladder infections. Musculoskeletal:  No history of joint or back disease. Hematologic:  Hemochromatosis, followed by Dr. Ross Marcus. Psycho-social:  The patient is oriented.   The patient has no obvious psychologic or social impairment to understanding our conversation and plan.  SOCIAL and FAMILY HISTORY: Married.  Husband with patient. 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  PHYSICAL EXAM: BP 126/83  Pulse 78  Temp(Src) 97.4 F (36.3 C) (Temporal)  Resp 18  Ht 5\' 3"  (1.6 m)  Wt 194 lb 6.4 oz (88.179 kg)  BMI 34.44 kg/m2  General: WN mildly obese WF with red hair and fair skin. She is alert and generally healthy appearing.  HEENT: Normal. Pupils equal. Good dentition. Neck: Supple. No mass.  No thyroid mass. Lymph Nodes:  No supraclavicular, cervical nodes or axillary adenopathy. Lungs: Clear to auscultation and symmetric breath sounds. Heart:  RRR. No murmur or rub.  Extremities:  1.2 cm scar at right upper arm/shoulder.  (Photo taken) Neurologic:  Grossly intact to motor and sensory function. Psychiatric: Has normal mood and affect. Behavior is normal.   DATA REVIEWED: Path report to patient.  Ovidio Kin, MD,  [facility] [address]  [address]   [place], [place]    [address] Phone:  [phone] FAX:  [phone]

## 2011-09-28 ENCOUNTER — Other Ambulatory Visit (INDEPENDENT_AMBULATORY_CARE_PROVIDER_SITE_OTHER): Payer: Self-pay | Admitting: Surgery

## 2011-09-28 DIAGNOSIS — C4359 Malignant melanoma of other part of trunk: Secondary | ICD-10-CM

## 2011-09-28 HISTORY — PX: MELANOMA EXCISION: SHX5266

## 2011-10-03 ENCOUNTER — Encounter (INDEPENDENT_AMBULATORY_CARE_PROVIDER_SITE_OTHER): Payer: Self-pay | Admitting: Surgery

## 2011-10-12 ENCOUNTER — Encounter (INDEPENDENT_AMBULATORY_CARE_PROVIDER_SITE_OTHER): Payer: Self-pay | Admitting: Surgery

## 2011-10-12 ENCOUNTER — Ambulatory Visit (INDEPENDENT_AMBULATORY_CARE_PROVIDER_SITE_OTHER): Payer: BC Managed Care – PPO | Admitting: Surgery

## 2011-10-12 VITALS — BP 130/88 | HR 68 | Temp 97.8°F | Resp 16 | Ht 63.0 in | Wt 197.0 lb

## 2011-10-12 DIAGNOSIS — C4360 Malignant melanoma of unspecified upper limb, including shoulder: Secondary | ICD-10-CM

## 2011-10-12 NOTE — Progress Notes (Signed)
   Re:   RACHANA MALESKY DOB:   1961-01-19 MRN:   [id number]  ASSESSMENT AND PLAN: 1.  Melanoma, right upper arm. (photo under media)  0.35 mm (T1b - it had one mitosis per HPF), Clark's level 2.  Wide excision on 09/28/2011 showed a few atypical melanocytes.  Margins were clear.  She will plan follow up skin screeing with Dr. Terri Piedra.  Return to our office on a PRN basis.  2.  HTN. 3.  GERD. 4.  Hemochromatosis.  Followed by Dr. Ross Marcus.  Right now she is just being observed. 5. She is having some allergy issues today.  REFERRING PHYSICIAN:   Dr. Alinda Sierras  HISTORY OF PRESENT ILLNESS: LANGSTON SUMMERFIELD is a 51 y.o. (DOB: 09-11-1960)  white female whose primary care physician is Rudi Heap, MD, MD and comes to me today for right arm melanoma.  She has her husband with her.  Her incision has healed well.  No complaint.  REVIEW OF SYSTEMS: Skin:  See HPI. Pulmonary:  Asthma, year around. Endocrine:  No diabetes. History of radioactive iodine ablation of thyroid.  Now on replacement therapy.. Gastrointestinal:  GERD.   Hematologic:  Hemochromatosis, followed by Dr. Ross Marcus.  SOCIAL and FAMILY HISTORY: Married.  Husband with patient. 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  PHYSICAL EXAM: BP 130/88  Pulse 68  Temp(Src) 97.8 F (36.6 C) (Temporal)  Resp 16  Ht 5\' 3"  (1.6 m)  Wt 197 lb (89.359 kg)  BMI 34.90 kg/m2  Extremities:  Scar right upper shoulder looks good.  Wound okay. Sutures out.  DATA REVIEWED: Path report to patient.  Ovidio Kin, MD,  [facility] [address]  [address]   [place], [place]    [address] Phone:  [phone] FAX:  [phone]

## 2011-11-14 ENCOUNTER — Encounter: Payer: Self-pay | Admitting: Gastroenterology

## 2011-12-13 ENCOUNTER — Ambulatory Visit (AMBULATORY_SURGERY_CENTER): Payer: BC Managed Care – PPO | Admitting: *Deleted

## 2011-12-13 VITALS — Ht 64.0 in | Wt 198.6 lb

## 2011-12-13 DIAGNOSIS — Z1211 Encounter for screening for malignant neoplasm of colon: Secondary | ICD-10-CM

## 2011-12-13 MED ORDER — PEG-KCL-NACL-NASULF-NA ASC-C 100 G PO SOLR: ORAL | Status: DC

## 2011-12-27 ENCOUNTER — Ambulatory Visit (AMBULATORY_SURGERY_CENTER): Payer: BC Managed Care – PPO | Admitting: Gastroenterology

## 2011-12-27 ENCOUNTER — Encounter: Payer: Self-pay | Admitting: Gastroenterology

## 2011-12-27 VITALS — BP 123/75 | HR 85 | Temp 95.8°F | Resp 20 | Ht 64.0 in | Wt 198.0 lb

## 2011-12-27 DIAGNOSIS — D1330 Benign neoplasm of unspecified part of small intestine: Secondary | ICD-10-CM

## 2011-12-27 DIAGNOSIS — D126 Benign neoplasm of colon, unspecified: Secondary | ICD-10-CM

## 2011-12-27 DIAGNOSIS — Z1211 Encounter for screening for malignant neoplasm of colon: Secondary | ICD-10-CM

## 2011-12-27 MED ORDER — SODIUM CHLORIDE 0.9 % IV SOLN: 500.0000 mL | INTRAVENOUS | Status: DC

## 2011-12-27 NOTE — Progress Notes (Signed)
Patient did not experience any of the following events: a burn prior to discharge; a fall within the facility; wrong site/side/patient/procedure/implant event; or a hospital transfer or hospital admission upon discharge from the facility. (G8907) Patient did not have preoperative order for IV antibiotic SSI prophylaxis. (G8918)  

## 2011-12-27 NOTE — Op Note (Signed)
[facility] [address] [place], [place]  [address]  COLONOSCOPY PROCEDURE REPORT  PATIENT:  Sarah Garrett, Sarah Garrett  MR#:  [id number] BIRTHDATE:  1961-06-21, 51 yrs. old  GENDER:  female ENDOSCOPIST:  Barbette Hair. Arlyce Dice, MD REF. BY:  Rudi Heap, M.D. PROCEDURE DATE:  12/27/2011 PROCEDURE:  Colonoscopy with snare polypectomy, Colonoscopy with biopsy ASA CLASS:  Class II INDICATIONS:  Routine Risk Screening MEDICATIONS:   MAC sedation, administered by CRNA propofol 250mg IV  DESCRIPTION OF PROCEDURE:   After the risks benefits and alternatives of the procedure were thoroughly explained, informed consent was obtained.  Digital rectal exam was performed and revealed no abnormalities.   The LB [id number] endoscope was introduced through the anus and advanced to the cecum, which was identified by both the appendix and ileocecal valve, without limitations.  The quality of the prep was good, using MoviPrep. The instrument was then slowly withdrawn as the colon was fully examined. <<PROCEDUREIMAGES>>  FINDINGS:  other finding. prominent ileocecal valve - ? adenomatous changes. Bxs taken (see image5).  A sessile polyp was found in the proximal transverse colon. It was 4 mm in size. Polyp was snared without cautery. Retrieval was successful (see image6). snare polyp  Moderate diverticulosis was found in the sigmoid colon (see image1).  Scattered diverticula were found. descending to transverse colon  This was otherwise a normal examination of the colon (see image3 and image8).   Retroflexed views in the rectum revealed no abnormalities.    The time to cecum =  1) 3.25 minutes. The scope was then withdrawn in  1) 10.0  minutes from the cecum and the procedure completed. COMPLICATIONS:  None ENDOSCOPIC IMPRESSION: 1) Other finding 2) 4 mm sessile polyp in the proximal transverse colon 3) Moderate diverticulosis in the sigmoid colon 4) Diverticula, scattered 5) Otherwise  normal examination RECOMMENDATIONS: 1) If the polyp(s) removed today are proven to be adenomatous (pre-cancerous) polyps, you will need a repeat colonoscopy in 5 years. Otherwise you should continue to follow colorectal cancer screening guidelines for "routine risk" patients with colonoscopy in 10 years. You will receive a letter within 1-2 weeks with the results of your biopsy as well as final recommendations. Please call my office if you have not received a letter after 3 weeks. REPEAT EXAM:   You will receive a letter from Dr. Arlyce Dice in 1-2 weeks, after reviewing the final pathology, with followup recommendations.  ______________________________ Barbette Hair Arlyce Dice, MD  CC:  n. eSIGNED:   Barbette Hair. Jesly Hartmann at 12/27/2011 10:35 AM  Deirdre Priest, [phone]

## 2011-12-27 NOTE — Patient Instructions (Signed)

## 2011-12-28 ENCOUNTER — Telehealth: Payer: Self-pay

## 2011-12-28 NOTE — Telephone Encounter (Signed)
  Follow up Call-  Call back number 12/27/2011  Post procedure Call Back phone  # [phone]  Permission to leave phone message Yes     Patient questions:  Do you have a fever, pain , or abdominal swelling? no Pain Score  0 *  Have you tolerated food without any problems? yes  Have you been able to return to your normal activities? yes  Do you have any questions about your discharge instructions: Diet   no Medications  no Follow up visit  no  Do you have questions or concerns about your Care? no  Actions: * If pain score is 4 or above: No action needed, pain <4.

## 2011-12-29 NOTE — Progress Notes (Signed)
Addended by: Maple Hudson on: 12/29/2011 07:29 AM   Modules accepted: Level of Service

## 2012-01-01 ENCOUNTER — Other Ambulatory Visit (HOSPITAL_BASED_OUTPATIENT_CLINIC_OR_DEPARTMENT_OTHER): Payer: BC Managed Care – PPO | Admitting: Lab

## 2012-01-01 ENCOUNTER — Ambulatory Visit: Payer: BC Managed Care – PPO | Admitting: Hematology & Oncology

## 2012-01-04 ENCOUNTER — Encounter: Payer: Self-pay | Admitting: Gastroenterology

## 2012-01-16 ENCOUNTER — Ambulatory Visit (HOSPITAL_BASED_OUTPATIENT_CLINIC_OR_DEPARTMENT_OTHER): Payer: BC Managed Care – PPO | Admitting: Hematology & Oncology

## 2012-01-16 ENCOUNTER — Other Ambulatory Visit: Payer: BC Managed Care – PPO | Admitting: Lab

## 2012-01-16 ENCOUNTER — Telehealth: Payer: Self-pay | Admitting: Hematology & Oncology

## 2012-01-16 VITALS — BP 119/74 | HR 84 | Temp 97.6°F | Ht 64.0 in | Wt 197.0 lb

## 2012-01-16 DIAGNOSIS — C4360 Malignant melanoma of unspecified upper limb, including shoulder: Secondary | ICD-10-CM

## 2012-01-16 LAB — CBC WITH DIFFERENTIAL (CANCER CENTER ONLY)
BASO%: 0.2 % (ref 0.0–2.0)
EOS%: 2.8 % (ref 0.0–7.0)
HCT: 40.6 % (ref 34.8–46.6)
LYMPH#: 2.2 10*3/uL (ref 0.9–3.3)
LYMPH%: 25.4 % (ref 14.0–48.0)
MCH: 31.8 pg (ref 26.0–34.0)
MCHC: 35.5 g/dL (ref 32.0–36.0)
MCV: 90 fL (ref 81–101)
MONO%: 4.1 % (ref 0.0–13.0)
NEUT%: 67.5 % (ref 39.6–80.0)
RDW: 14.1 % (ref 11.1–15.7)

## 2012-01-16 NOTE — Progress Notes (Signed)
This office note has been dictated.

## 2012-01-16 NOTE — Telephone Encounter (Signed)
Mailed December schedule °

## 2012-01-16 NOTE — Progress Notes (Signed)
CC:   Sarah Garrett. Sarah Garrett, M.D. Sarah Garrett, M.D. Sarah Garrett, M.D.  DIAGNOSES: 1. Hemochromatosis, double heterozygote mutation. 2. Stage IA (T1a N0 M0) melanoma of the right upper arm.  CURRENT THERAPY: 1. Phlebotomy to maintain ferritin less than 100. 2. The patient is status post wide local excision of the melanoma.  INTERIM HISTORY:  Sarah Garrett comes in for followup.  Shockingly enough, she was found have a melanoma on the right upper arm.  When we saw her back in December, she had an unusual nevus in the right upper arm.  We sent her to Sarah Garrett.  He went ahead and did a biopsy.  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  It had a Breslow depth of 0.35 mm.  There was no vascular invasion.  There was no ulceration.  She had some of regression.  She had 1 mitosis per high-powered field.  She is seen by Dr. Caryn Section.  He went ahead and did a wide local excision.  This was done on February 28th.  The pathology report ([id number]) showed no residual malignancy with all margins free.  She did not need a sentinel node biopsy.  From the standpoint of melanoma, she has a 95% chance of cure.  As far as her hemochromatosis goes, we have not had to phlebotomize her. Her last ferritin back in December was 20.  She feels okay.  She has had no fatigue or weakness.  She has had no nausea or vomiting.  She has had no headache.  Her husband has been having a lot of health issues.  They have been trying to keep up with the medical bills. She does have hypothyroidism.  She is on Synthroid.  She has had no issues with her monthly cycles.  PHYSICAL EXAMINATION:  This is a well-developed, well-nourished white female in no obvious distress.  Vital signs:  Temperature of 97.6, pulse 84, respiratory rate 18, blood pressure 119/74.  Weight is 197.  Head and neck:  Normocephalic, atraumatic skull.  There are no ocular or  oral lesions.  There are no palpable cervical or supraclavicular lymph nodes. Lungs:  Clear bilaterally.  Cardiac:  Regular rate and rhythm with a normal S1 and S2.  There are no murmurs, rubs or bruits.  Abdomen: Soft, good bowel sounds.  There is no palpable abdominal mass.  No palpable hepatosplenomegaly.  Extremities:  Shows the wide local excision on the anterior surface of the right upper arm/shoulder.  There is no lymphedema in the right arm.  She has good range motion of the joints.  Axillary exam shows no axillary adenopathy bilaterally.  Skin: No unusual hyperpigmented lesions.  LABORATORY STUDIES:  White cell count is 8.5, hemoglobin 14.4, hematocrit 40.6, platelet count 189.  MCV is 90.  IMPRESSION:  Sarah Garrett is a 51 year old white female with hemochromatosis.  This, again, is not an issue.  I would be surprised if she needed to be phlebotomized.  Her MCV is starting to go up, however, so we will watch her ferritin closely.  The melanoma was certainly a shock.  The lentigo maligna subtype is typically less aggressive than the other forms of melanoma.  Again, I believe her risk of recurrence is less than 5%.  She sees Sarah Garrett every 6 months.  I will plan to see Sarah Garrett back in December.  We will let her know about her iron results.    ______________________________ Josph Macho,  M.D. PRE/MEDQ  D:  01/16/2012  T:  01/16/2012  Job:  [id number]

## 2012-01-26 ENCOUNTER — Telehealth: Payer: Self-pay | Admitting: *Deleted

## 2012-01-26 NOTE — Telephone Encounter (Signed)
Message copied by Anselm Jungling on Fri Jan 26, 2012 11:32 AM ------      Message from: Arlan Organ R      Created: Thu Jan 18, 2012  6:49 PM       Call and tell her that her iron is great. Her ferritin is only 26. She does not need a phlebotomy. Cindee Lame

## 2012-01-26 NOTE — Telephone Encounter (Signed)
Called patient to let her know that her iron levels were good at 26 and does not need a phlebotomy

## 2012-07-17 ENCOUNTER — Other Ambulatory Visit (HOSPITAL_BASED_OUTPATIENT_CLINIC_OR_DEPARTMENT_OTHER): Payer: BC Managed Care – PPO | Admitting: Lab

## 2012-07-17 ENCOUNTER — Ambulatory Visit (HOSPITAL_BASED_OUTPATIENT_CLINIC_OR_DEPARTMENT_OTHER): Payer: BC Managed Care – PPO | Admitting: Medical

## 2012-07-17 DIAGNOSIS — C4360 Malignant melanoma of unspecified upper limb, including shoulder: Secondary | ICD-10-CM

## 2012-07-17 LAB — CBC WITH DIFFERENTIAL (CANCER CENTER ONLY)
Eosinophils Absolute: 0.6 10*3/uL — ABNORMAL HIGH (ref 0.0–0.5)
HCT: 42.2 % (ref 34.8–46.6)
LYMPH%: 28.1 % (ref 14.0–48.0)
MCH: 30.9 pg (ref 26.0–34.0)
MCV: 89 fL (ref 81–101)
MONO%: 5.9 % (ref 0.0–13.0)
NEUT%: 57.4 % (ref 39.6–80.0)
Platelets: 172 10*3/uL (ref 145–400)
RDW: 13.3 % (ref 11.1–15.7)

## 2012-07-17 LAB — COMPREHENSIVE METABOLIC PANEL
ALT: 38 U/L — ABNORMAL HIGH (ref 0–35)
BUN: 10 mg/dL (ref 6–23)
CO2: 29 mEq/L (ref 19–32)
Calcium: 10.1 mg/dL (ref 8.4–10.5)
Chloride: 101 mEq/L (ref 96–112)
Creatinine, Ser: 0.83 mg/dL (ref 0.50–1.10)
Total Bilirubin: 0.7 mg/dL (ref 0.3–1.2)

## 2012-07-17 LAB — IRON AND TIBC
%SAT: 28 % (ref 20–55)
Iron: 99 ug/dL (ref 42–145)
TIBC: 360 ug/dL (ref 250–470)
UIBC: 261 ug/dL (ref 125–400)

## 2012-07-17 LAB — FERRITIN: Ferritin: 49 ng/mL (ref 10–291)

## 2012-07-17 LAB — AFP TUMOR MARKER: AFP-Tumor Marker: 2.2 ng/mL (ref 0.0–8.0)

## 2012-07-17 NOTE — Progress Notes (Signed)
Diagnoses: #1.  Hemochromatosis, double heterozygote mutation. #2 stage IA (T1 A., N0, M0) melanoma of the right upper arm.  Current therapy: #1 phlebotomy to maintain ferritin less than 100. #2 the patient is status post wide local excision of the melanoma.  Interim history: Sarah Garrett presents today for an office followup visit.  Overall, she, reports, that she's doing quite well.  She states, that she has not had a menstrual cycle since December.  It will be interesting to see if she will set have to have phlebotomies now.  She is never had to have a phlebotomy.  Her ferritin level has always been well below 100.  She continues to followup with Dr. Terri Piedra regarding her history of melanoma.  She sees him every 6 months.  She's not reporting any excessive fatigue or weakness.  She has a good appetite.  She denies any nausea, vomiting, diarrhea, constipation.  She denies any fevers, chills, or night sweats.  She denies any cough, chest pain, shortness of breath.  She denies any obvious, or abnormal bleeding.  She denies any headaches, visual changes, or rashes.  She denies any lower leg swelling.  She does have hypothyroidism.  She is on Synthroid.  Overall, she, reports, that she's doing quite well.  Review of Systems: Constitutional:Negative for malaise/fatigue, fever, chills, weight loss, diaphoresis, activity change, appetite change, and unexpected weight change.  HEENT: Negative for double vision, blurred vision, visual loss, ear pain, tinnitus, congestion, rhinorrhea, epistaxis sore throat or sinus disease, oral pain/lesion, tongue soreness Respiratory: Negative for cough, chest tightness, shortness of breath, wheezing and stridor.  Cardiovascular: Negative for chest pain, palpitations, leg swelling, orthopnea, PND, DOE or claudication Gastrointestinal: Negative for nausea, vomiting, abdominal pain, diarrhea, constipation, blood in stool, melena, hematochezia, abdominal distention, anal  bleeding, rectal pain, anorexia and hematemesis.  Genitourinary: Negative for dysuria, frequency, hematuria,  Musculoskeletal: Negative for myalgias, back pain, joint swelling, arthralgias and gait problem.  Skin: Negative for rash, color change, pallor and wound.  Neurological:. Negative for dizziness/light-headedness, tremors, seizures, syncope, facial asymmetry, speech difficulty, weakness, numbness, headaches and paresthesias.  Hematological: Negative for adenopathy. Does not bruise/bleed easily.  Psychiatric/Behavioral:  Negative for depression, no loss of interest in normal activity or change in sleep pattern.   Physical Exam: This is a 51 year old, well-developed, well-nourished, white female, in no obvious distress Vitals: Temperature 96.6 degrees, pulse 86, respirations 20, blood pressure 131/80.  Weight 190 pounds HEENT reveals a normocephalic, atraumatic skull, no scleral icterus, no oral lesions  Neck is supple without any cervical or supraclavicular adenopathy.  Lungs are clear to auscultation bilaterally. There are no wheezes, rales or rhonci Cardiac is regular rate and rhythm with a normal S1 and S2. There are no murmurs, rubs, or bruits.  Abdomen is soft with good bowel sounds, there is no palpable mass. There is no palpable hepatosplenomegaly. There is no palpable fluid wave.  Musculoskeletal no tenderness of the spine, ribs, or hips.  Extremities there are no clubbing, cyanosis, or edema.  Skin no petechia, purpura or ecchymosis Neurologic is nonfocal.  Laboratory Data:  White count 7.2, hemoglobin 14.6, hematocrit 42.2, platelet 172,000  Current Outpatient Prescriptions on File Prior to Visit  Medication Sig Dispense Refill  . albuterol (PROVENTIL HFA;VENTOLIN HFA) 108 (90 BASE) MCG/ACT inhaler Inhale 2 puffs into the lungs every 6 (six) hours as needed.        Marland Kitchen atorvastatin (LIPITOR) 40 MG tablet Take 40 mg by mouth at bedtime.        Marland Kitchen  cetirizine (ZYRTEC) 10 MG tablet  Take 10 mg by mouth daily.        . Fluticasone-Salmeterol (ADVAIR DISKUS) 100-50 MCG/DOSE AEPB Inhale 1 puff into the lungs. As needed         . furosemide (LASIX) 40 MG tablet Take 20 mg by mouth as needed.       Marland Kitchen HYDROcodone-acetaminophen (NORCO) 5-325 MG per tablet every 8 (eight) hours as needed.       . hyoscyamine (HYOMAX-SL) 0.125 MG SL tablet Place 0.125 mg under the tongue as needed. Place 1-2 tablet under the tongue until it dissolves as needed      . KLOR-CON 10 10 MEQ tablet every morning.       Marland Kitchen levothyroxine (SYNTHROID, LEVOTHROID) 125 MCG tablet Take 125 mcg by mouth Daily.      Marland Kitchen LOVAZA 1 G capsule 1 g daily.       Marland Kitchen MICROGESTIN FE 1/20 1-20 MG-MCG tablet Take by mouth Daily.      . mometasone-formoterol (DULERA) 100-5 MCG/ACT AERO Inhale 2 puffs into the lungs 2 (two) times daily.        . montelukast (SINGULAIR) 10 MG tablet Take 10 mg by mouth at bedtime.        Marland Kitchen NEXIUM 40 MG capsule Take 40 mg by mouth as needed.       Marland Kitchen olmesartan-hydrochlorothiazide (BENICAR HCT) 40-12.5 MG per tablet Take 1 tablet by mouth daily. Take half tablet by mouth       . ZETIA 10 MG tablet Take 10 mg by mouth daily. Takes 1/2 tablet daily       Assessment/Plan: this is a pleasant, 51 year old, white female, with the following issues:  #1.  Hemochromatosis.  We like to maintain her ferritin below 100.    We have not yet had to phlebotomize her. She is asymptomatic.   We will see what her ferritin level is sent.  She has not had a menstrual cycle since September.  We will call her if she needs to come in for a phlebotomy.  #2.  History of stage IA melanoma of the right upper arm.  She continues to followup with her dermatologist every 6 months.  Her risk of recurrence is less than 5%.  #3.  We will follow back up with Sarah Garrett in 6 months, but before then should there be questions or concerns.

## 2012-07-18 ENCOUNTER — Telehealth: Payer: Self-pay | Admitting: *Deleted

## 2012-07-18 NOTE — Telephone Encounter (Signed)
Message copied by Anselm Jungling on Thu Jul 18, 2012 10:12 AM ------      Message from: Arlan Organ R      Created: Thu Jul 18, 2012  7:21 AM       Call - iron is still ok!!!  No need for phlebotomy!!  Cindee Lame

## 2012-07-18 NOTE — Telephone Encounter (Signed)
Called patient to let her know that her iron levels are ok per dr. Myna Hidalgo and no need for a phlebotomy

## 2012-10-24 ENCOUNTER — Ambulatory Visit (INDEPENDENT_AMBULATORY_CARE_PROVIDER_SITE_OTHER): Payer: BC Managed Care – PPO | Admitting: Physician Assistant

## 2012-10-24 VITALS — BP 122/81 | HR 119 | Temp 99.3°F | Ht 63.0 in | Wt 186.0 lb

## 2012-10-24 DIAGNOSIS — E1059 Type 1 diabetes mellitus with other circulatory complications: Secondary | ICD-10-CM

## 2012-10-24 DIAGNOSIS — R109 Unspecified abdominal pain: Secondary | ICD-10-CM

## 2012-10-24 DIAGNOSIS — K5732 Diverticulitis of large intestine without perforation or abscess without bleeding: Secondary | ICD-10-CM

## 2012-10-24 LAB — POCT URINALYSIS DIPSTICK
Leukocytes, UA: NEGATIVE
Nitrite, UA: NEGATIVE
Protein, UA: NEGATIVE
pH, UA: 5

## 2012-10-24 LAB — POCT UA - MICROALBUMIN: Microalbumin Ur, POC: 20 mg/dL

## 2012-10-24 LAB — POCT CBC
Granulocyte percent: 78.6 %G (ref 37–80)
Lymph, poc: 2 (ref 0.6–3.4)
POC Granulocyte: 9.2 — AB (ref 2–6.9)
Platelet Count, POC: 159 10*3/uL (ref 142–424)
RDW, POC: 14.3 %
WBC: 11.7 10*3/uL — AB (ref 4.6–10.2)

## 2012-10-24 LAB — POCT UA - MICROSCOPIC ONLY

## 2012-10-24 MED ORDER — METRONIDAZOLE 500 MG PO TABS: 500.0000 mg | ORAL_TABLET | Freq: Two times a day (BID) | ORAL | Status: DC

## 2012-10-24 MED ORDER — CLARITHROMYCIN 250 MG PO TABS: 250.0000 mg | ORAL_TABLET | Freq: Two times a day (BID) | ORAL | Status: DC

## 2012-10-24 NOTE — Progress Notes (Signed)
  Subjective:    Patient ID: Sarah Garrett, female    DOB: 07/18/1961, 52 y.o.   MRN: [id number]  HPI Abdominal sx for 3 days; no diarrhea, no constipation; pt. Reports that colonoscopy showed diverticula; pt. Still has appendix   Review of Systems  Constitutional: Positive for fever.  Gastrointestinal: Positive for abdominal pain. Negative for vomiting, diarrhea and constipation.  Genitourinary: Positive for pelvic pain. Negative for urgency and decreased urine volume.  All other systems reviewed and are negative.       Objective:   Physical Exam  Constitutional: She is oriented to person, place, and time. She appears well-developed and well-nourished. She appears distressed.  Facial grimace  HENT:  Head: Normocephalic and atraumatic.  Abdominal: She exhibits no mass. There is tenderness. There is guarding.  LLQ and RLQ tenderness WBCs 11.2 Negative Iliopsoas sign  Genitourinary:  U/A neg  Neurological: She is alert and oriented to person, place, and time.  Skin: She is diaphoretic.          Assessment & Plan:   Abdominal pain, unspecified site - Plan: POCT CBC, POCT UA - Microalbumin, POCT urinalysis dipstick, POCT UA - Microscopic Only  Diverticulitis of colon (without mention of hemorrhage) - Plan: metroNIDAZOLE (FLAGYL) 500 MG tablet, clarithromycin (BIAXIN) 250 MG tablet  Type I (juvenile type) diabetes mellitus with peripheral circulatory disorders, not stated as uncontrolled(250.71) - Plan: Microalbumin, urine patient advised to monitor symptoms and proceed to ER if sx worsen, we discussed the fact that she still has her appendix

## 2012-10-25 LAB — MICROALBUMIN, URINE: Microalb, Ur: 0.5 mg/dL (ref 0.00–1.89)

## 2012-11-10 ENCOUNTER — Other Ambulatory Visit: Payer: Self-pay | Admitting: Family Medicine

## 2012-12-12 ENCOUNTER — Other Ambulatory Visit: Payer: Self-pay | Admitting: Dermatology

## 2013-01-06 ENCOUNTER — Other Ambulatory Visit: Payer: Self-pay | Admitting: Dermatology

## 2013-01-10 ENCOUNTER — Other Ambulatory Visit: Payer: Self-pay | Admitting: Family Medicine

## 2013-01-15 ENCOUNTER — Other Ambulatory Visit (HOSPITAL_BASED_OUTPATIENT_CLINIC_OR_DEPARTMENT_OTHER): Payer: BC Managed Care – PPO | Admitting: Lab

## 2013-01-15 ENCOUNTER — Ambulatory Visit (HOSPITAL_BASED_OUTPATIENT_CLINIC_OR_DEPARTMENT_OTHER): Payer: BC Managed Care – PPO | Admitting: Hematology & Oncology

## 2013-01-15 DIAGNOSIS — C4360 Malignant melanoma of unspecified upper limb, including shoulder: Secondary | ICD-10-CM

## 2013-01-15 LAB — CBC WITH DIFFERENTIAL (CANCER CENTER ONLY)
BASO#: 0 10*3/uL (ref 0.0–0.2)
Eosinophils Absolute: 0.6 10*3/uL — ABNORMAL HIGH (ref 0.0–0.5)
HCT: 44.3 % (ref 34.8–46.6)
HGB: 15.5 g/dL (ref 11.6–15.9)
LYMPH%: 30.3 % (ref 14.0–48.0)
MCV: 90 fL (ref 81–101)
MONO#: 0.4 10*3/uL (ref 0.1–0.9)
Platelets: 179 10*3/uL (ref 145–400)
RBC: 4.91 10*6/uL (ref 3.70–5.32)
WBC: 7.5 10*3/uL (ref 3.9–10.0)

## 2013-01-15 LAB — IRON AND TIBC
Iron: 115 ug/dL (ref 42–145)
UIBC: 262 ug/dL (ref 125–400)

## 2013-01-15 LAB — COMPREHENSIVE METABOLIC PANEL
Albumin: 4.6 g/dL (ref 3.5–5.2)
CO2: 28 mEq/L (ref 19–32)
Glucose, Bld: 93 mg/dL (ref 70–99)
Sodium: 141 mEq/L (ref 135–145)
Total Bilirubin: 0.5 mg/dL (ref 0.3–1.2)
Total Protein: 7.7 g/dL (ref 6.0–8.3)

## 2013-01-15 LAB — FERRITIN: Ferritin: 52 ng/mL (ref 10–291)

## 2013-01-15 NOTE — Progress Notes (Signed)
This office note has been dictated.

## 2013-01-16 ENCOUNTER — Other Ambulatory Visit (INDEPENDENT_AMBULATORY_CARE_PROVIDER_SITE_OTHER): Payer: BC Managed Care – PPO

## 2013-01-16 DIAGNOSIS — I10 Essential (primary) hypertension: Secondary | ICD-10-CM

## 2013-01-16 DIAGNOSIS — E785 Hyperlipidemia, unspecified: Secondary | ICD-10-CM

## 2013-01-16 DIAGNOSIS — E039 Hypothyroidism, unspecified: Secondary | ICD-10-CM

## 2013-01-16 DIAGNOSIS — E559 Vitamin D deficiency, unspecified: Secondary | ICD-10-CM

## 2013-01-16 DIAGNOSIS — R5381 Other malaise: Secondary | ICD-10-CM

## 2013-01-16 LAB — POCT CBC
Granulocyte percent: 67.4 %G (ref 37–80)
Hemoglobin: 15.5 g/dL (ref 12.2–16.2)
MCHC: 35.7 g/dL — AB (ref 31.8–35.4)
MPV: 7.9 fL (ref 0–99.8)
POC Granulocyte: 5.3 (ref 2–6.9)
POC LYMPH PERCENT: 29.9 %L (ref 10–50)
RBC: 4.9 M/uL (ref 4.04–5.48)

## 2013-01-16 LAB — HEPATIC FUNCTION PANEL
ALT: 16 U/L (ref 0–35)
AST: 20 U/L (ref 0–37)
Albumin: 4.4 g/dL (ref 3.5–5.2)
Total Bilirubin: 0.6 mg/dL (ref 0.3–1.2)

## 2013-01-16 LAB — BASIC METABOLIC PANEL WITH GFR
CO2: 29 mEq/L (ref 19–32)
Calcium: 10.3 mg/dL (ref 8.4–10.5)
Potassium: 3.9 mEq/L (ref 3.5–5.3)
Sodium: 141 mEq/L (ref 135–145)

## 2013-01-16 LAB — THYROID PANEL WITH TSH
T3 Uptake: 35.4 % (ref 22.5–37.0)
T4, Total: 12.5 ug/dL (ref 5.0–12.5)

## 2013-01-16 NOTE — Progress Notes (Signed)
CC:   Sarah Garrett, M.D. Frederick A. Worthy Rancher, M.D.  DIAGNOSES: 1. 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 2. Stage IA (T1a N0 M0) melanoma of the right arm.  CURRENT THERAPY:  Phlebotomy to maintain ferritin less than 100.  INTERIM HISTORY:  Sarah Garrett comes in for followup.  She is doing okay. Unfortunately, she was found to have another dysplastic lesion on her skin.  This was in the right gluteal region.  She saw Dr. Terri Piedra.  He went ahead and did an excision.  The pathology report ([id number]) showed dysplastic nevus.  This was re-excised.  All margins were negative.  Otherwise, she is doing okay.  She has had no problems with nausea or vomiting.  There has been no cough.  There has been no bleeding.  She said she does not have any cycles.  She had her last cycle back in September 2013.  There have been no issues with the hemochromatosis.  Her last ferritin back in December was 49.  We have yet to phlebotomize since we started seeing her.  She has had no headache.  There has been no double vision or blurred vision.  Her last mammogram was back in the fall of 2013.  PHYSICAL EXAMINATION:  General:  This is a well-developed, well- nourished white female in no obvious distress.  Vital signs: Temperature of 97.9, pulse 64, respiratory rate 16, blood pressure 104/60.  Weight is 185.  Head and neck:  Normocephalic, atraumatic skull.  There are no ocular or oral lesions.  There are no palpable cervical or supraclavicular lymph nodes.  Lungs:  Clear bilaterally. Cardiac:  Regular rate and rhythm with a normal S1 and S2.  There are no murmurs, rubs, or bruits.  Abdomen:  Soft with good bowel sounds.  There is no palpable abdominal mass.  There is no fluid wave.  No palpable hepatosplenomegaly.  Back:  No tenderness over the spine, ribs, or hips. Extremities:  No clubbing, cyanosis, or edema.  Skin:  Does show numerous hyperpigmented lesions.  I do not  see anything that looks suspicious.  LABORATORY STUDIES:  White cell count is 7.5, hemoglobin 15.5, hematocrit 44.3, platelet count 179.  MCV is 90.  IMPRESSION:  Sarah Garrett is a very charming 52 year old white female with hemochromatosis.  Again, she is double heterozygous.  She has C282Y and H63D mutations.  I am somewhat more concerned about the melanoma that she has.  She just had a dysplastic nevus checked.  This was removed.  Dr. Terri Piedra is doing a great job with her skin exams and taking off anything that looks unusual.  We will plan to have her come back to see [place] in another 6 months.  I do not see a need for any lab work in between visits.    ______________________________ Josph Macho, M.D. PRE/MEDQ  D:  01/15/2013  T:  01/16/2013  Job:  [id number]

## 2013-01-16 NOTE — Patient Instructions (Signed)
Pt here today for labs only 

## 2013-01-17 LAB — NMR LIPOPROFILE WITH LIPIDS
HDL Size: 8.6 nm — ABNORMAL LOW (ref >=9.2)
LDL Particle Number: 1665 nmol/L — ABNORMAL HIGH (ref <1000)
Large VLDL-P: 13.1 nmol/L — ABNORMAL HIGH (ref <=2.7)
Small LDL Particle Number: 1059 nmol/L — ABNORMAL HIGH (ref <=527)
Triglycerides: 220 mg/dL — ABNORMAL HIGH (ref <150)
VLDL Size: 54.5 nm — ABNORMAL HIGH (ref <=46.6)

## 2013-01-22 ENCOUNTER — Telehealth: Payer: Self-pay | Admitting: *Deleted

## 2013-01-22 ENCOUNTER — Encounter: Payer: Self-pay | Admitting: Family Medicine

## 2013-01-22 ENCOUNTER — Ambulatory Visit (INDEPENDENT_AMBULATORY_CARE_PROVIDER_SITE_OTHER): Payer: BC Managed Care – PPO | Admitting: Family Medicine

## 2013-01-22 VITALS — BP 126/88 | HR 89 | Temp 97.7°F | Ht 64.5 in | Wt 186.0 lb

## 2013-01-22 DIAGNOSIS — J42 Unspecified chronic bronchitis: Secondary | ICD-10-CM

## 2013-01-22 DIAGNOSIS — E785 Hyperlipidemia, unspecified: Secondary | ICD-10-CM

## 2013-01-22 DIAGNOSIS — I10 Essential (primary) hypertension: Secondary | ICD-10-CM

## 2013-01-22 DIAGNOSIS — J309 Allergic rhinitis, unspecified: Secondary | ICD-10-CM

## 2013-01-22 MED ORDER — EZETIMIBE 10 MG PO TABS: 10.0000 mg | ORAL_TABLET | Freq: Every day | ORAL | Status: DC

## 2013-01-22 MED ORDER — ESOMEPRAZOLE MAGNESIUM 40 MG PO CPDR: 40.0000 mg | DELAYED_RELEASE_CAPSULE | ORAL | Status: DC | PRN

## 2013-01-22 MED ORDER — ALBUTEROL SULFATE HFA 108 (90 BASE) MCG/ACT IN AERS: 2.0000 | INHALATION_SPRAY | Freq: Four times a day (QID) | RESPIRATORY_TRACT | Status: DC | PRN

## 2013-01-22 MED ORDER — FLUTICASONE-SALMETEROL 100-50 MCG/DOSE IN AEPB: 1.0000 | INHALATION_SPRAY | Freq: Every day | RESPIRATORY_TRACT | Status: DC | PRN

## 2013-01-22 MED ORDER — OLMESARTAN MEDOXOMIL-HCTZ 40-12.5 MG PO TABS: 1.0000 | ORAL_TABLET | Freq: Every day | ORAL | Status: DC

## 2013-01-22 MED ORDER — ATORVASTATIN CALCIUM 40 MG PO TABS: 40.0000 mg | ORAL_TABLET | Freq: Every day | ORAL | Status: DC

## 2013-01-22 MED ORDER — HYOSCYAMINE SULFATE 0.125 MG SL SUBL: 0.1250 mg | SUBLINGUAL_TABLET | SUBLINGUAL | Status: DC | PRN

## 2013-01-22 MED ORDER — FUROSEMIDE 40 MG PO TABS: 20.0000 mg | ORAL_TABLET | Freq: Every day | ORAL | Status: DC

## 2013-01-22 MED ORDER — LEVOTHYROXINE SODIUM 125 MCG PO TABS: 125.0000 ug | ORAL_TABLET | Freq: Every day | ORAL | Status: DC

## 2013-01-22 MED ORDER — OMEGA-3-ACID ETHYL ESTERS 1 G PO CAPS: 1.0000 g | ORAL_CAPSULE | Freq: Every day | ORAL | Status: DC

## 2013-01-22 MED ORDER — MONTELUKAST SODIUM 10 MG PO TABS: 10.0000 mg | ORAL_TABLET | Freq: Every day | ORAL | Status: DC

## 2013-01-22 NOTE — Progress Notes (Signed)
  Subjective:    Patient ID: Sarah Garrett, female    DOB: 06-May-1961, 51 y.o.   MRN: [id number]  HPI Everything is going pretty well for the patient right now. She has been exercising more she's been taking her medicines correctly. Hematologist follows her for her hemochromatosis. He is please with that part of her medical care. Reports are in the record somewhere regarding her iron studies at the hematologist office. Review of Systems  Constitutional: Negative.   HENT: Negative.   Eyes: Negative.   Respiratory: Negative.   Cardiovascular: Negative.   Gastrointestinal: Negative.   Genitourinary: Negative.   Hematological: Negative.   Psychiatric/Behavioral: Negative.    Recent visit to the dermatologist result in another excision of a dysplastic nevus near the right hip area. Water excision of margins was clear of any serious issues.     Objective:   Physical Exam BP 126/88  Pulse 89  Temp(Src) 97.7 F (36.5 C) (Oral)  Ht 5' 4.5" (1.638 m)  Wt 186 lb (84.369 kg)  BMI 31.45 kg/m2  LMP 11/29/2011  The patient appeared well nourished and normally developed, alert and oriented to time and place. Speech, behavior and judgement appear normal. Vital signs as documented.  Head exam is unremarkable. No scleral icterus or pallor noted.  Neck is without jugular venous distension, thyromegally, or carotid bruits. Carotid upstrokes are brisk bilaterally. No cervical adenopathy. Lungs are clear anteriorly and posteriorly to auscultation except for a few inspiratory wheezes bilateral. Normal respiratory effort. Cardiac exam reveals regular rate and rhythm at 84 per minute. First and second heart sounds normal.  No murmurs, rubs or gallops.  Abdominal exam reveals normal bowl sounds, no masses, no organomegaly and no aortic enlargement. No inguinal adenopathy. She was slightly tender in the left lower quadrant. Extremities are nonedematous and both femoral and pedal pulses are normal. Skin  without pallor or jaundice.  Warm and dry, without rash. She does have a lot of nevi. Neurologic exam reveals normal deep tendon reflexes and normal sensation.           Assessment & Plan:  HYPERTENSION  Hemochromatosis - Plan: levothyroxine (SYNTHROID, LEVOTHROID) 125 MCG tablet, albuterol (PROVENTIL HFA;VENTOLIN HFA) 108 (90 BASE) MCG/ACT inhaler  BRONCHITIS, CHRONIC  HYPERLIPIDEMIA  ALLERGIC RHINITIS  Patient Instructions  Always be careful and don't put herself at risk of falling. Continue current medicines except decrease levothyroxin to one half on Saturday and Sunday. Because the total LDL particle number being elevated increase the zetia to one every day Try to get even more exercise

## 2013-01-22 NOTE — Progress Notes (Signed)
  Subjective:    Patient ID: Sarah Garrett, female    DOB: 04-08-1961, 53 y.o.   MRN: [id number]  HPI    Review of Systems  Constitutional: Negative.   HENT: Negative.   Eyes: Negative.   Respiratory: Negative.        Using Advair and Singulair daily.  Has not had to use albuterol inhaler in a couple of weeks.  Cardiovascular: Negative.   Gastrointestinal: Negative.   Endocrine: Negative.   Genitourinary: Negative.   Musculoskeletal: Positive for back pain (L5-S1 narrowing.  Treated at [facility]  Skin: Negative.   Allergic/Immunologic: Negative.   Neurological: Negative.   Hematological: Negative.   Psychiatric/Behavioral: Positive for sleep disturbance.       Objective:   Physical Exam        Assessment & Plan:

## 2013-01-22 NOTE — Telephone Encounter (Signed)
Pt called asking about her Ferritin results from last week. Gave her the results and her parameters for phlebotomy. She will need one if her Ferritin gets above 100. Pt verbalized understanding and stated "looks like I might have a while to go".

## 2013-01-22 NOTE — Patient Instructions (Addendum)
Always be careful and don't put herself at risk of falling. Continue current medicines except decrease levothyroxin to one half on Saturday and Sunday. Because the total LDL particle number being elevated increase the zetia to one every day Try to get even more exercise

## 2013-02-04 ENCOUNTER — Encounter: Payer: Self-pay | Admitting: Family Medicine

## 2013-02-04 ENCOUNTER — Ambulatory Visit (INDEPENDENT_AMBULATORY_CARE_PROVIDER_SITE_OTHER): Payer: BC Managed Care – PPO | Admitting: Family Medicine

## 2013-02-04 VITALS — BP 104/73 | HR 99 | Temp 98.2°F | Ht 63.75 in | Wt 185.0 lb

## 2013-02-04 DIAGNOSIS — J069 Acute upper respiratory infection, unspecified: Secondary | ICD-10-CM

## 2013-02-04 DIAGNOSIS — J029 Acute pharyngitis, unspecified: Secondary | ICD-10-CM

## 2013-02-04 DIAGNOSIS — R062 Wheezing: Secondary | ICD-10-CM

## 2013-02-04 LAB — POCT RAPID STREP A (OFFICE): Rapid Strep A Screen: NEGATIVE

## 2013-02-04 MED ORDER — METHYLPREDNISOLONE ACETATE 80 MG/ML IJ SUSP
80.0000 mg | Freq: Once | INTRAMUSCULAR | Status: AC
  Administered 2013-02-04: 80 mg via INTRAMUSCULAR

## 2013-02-04 MED ORDER — METHYLPREDNISOLONE ACETATE 40 MG/ML IJ SUSP: 80.0000 mg | Freq: Once | INTRAMUSCULAR | Status: DC

## 2013-02-04 MED ORDER — AMOXICILLIN 875 MG PO TABS: 875.0000 mg | ORAL_TABLET | Freq: Two times a day (BID) | ORAL | Status: DC

## 2013-02-04 NOTE — Progress Notes (Signed)
  Subjective:    Patient ID: Sarah Garrett, female    DOB: Jun 23, 1961, 52 y.o.   MRN: [id number]  HPI  URI Symptoms Onset: 3-4 days  Description: rhinorrhea, nasal congestion, cough, mild wheezing  Modifying factors:  Baseline allergies and asthma   Symptoms Nasal discharge: yes  Fever: no Sore throat: yes Cough: yes Wheezing: yes Ear pain: no GI symptoms: no Sick contacts: yes; daughter with similar sxs   Red Flags  Stiff neck: no Dyspnea: no Rash: no Swallowing difficulty: no  Sinusitis Risk Factors Headache/face pain: no Double sickening: no tooth pain: no  Allergy Risk Factors Sneezing: yes Itchy scratchy throat: no Seasonal symptoms: yes  Flu Risk Factors Headache: no muscle aches: no severe fatigue: no     Review of Systems  All other systems reviewed and are negative.       Objective:   Physical Exam  Constitutional: She appears well-developed and well-nourished.  HENT:  Head: Normocephalic and atraumatic.  Right Ear: External ear normal.  Left Ear: External ear normal.  +nasal erythema, rhinorrhea bilaterally, + post oropharyngeal erythema    Eyes: Conjunctivae are normal. Pupils are equal, round, and reactive to light.  Neck: Normal range of motion. Neck supple.  Cardiovascular: Normal rate, regular rhythm and normal heart sounds.   Pulmonary/Chest: Effort normal. She has wheezes.  Abdominal: Soft.  Musculoskeletal: Normal range of motion.  Lymphadenopathy:    She has no cervical adenopathy.  Neurological: She is alert.  Skin: Skin is warm.          Assessment & Plan:  Sore throat - Plan: POCT rapid strep A, amoxicillin (AMOXIL) 875 MG tablet  Wheezing - Plan: methylPREDNISolone acetate (DEPO-MEDROL) injection 80 mg  URI (upper respiratory infection) Likely viral URI with overlapping allergic process.  amox for soft tissue coverage given daughter's strep sxs.  Depomedrol for wheezing Discussed supportive care and  infectious/resp red flags.  Follow up as needed.

## 2013-03-05 ENCOUNTER — Other Ambulatory Visit (INDEPENDENT_AMBULATORY_CARE_PROVIDER_SITE_OTHER): Payer: BC Managed Care – PPO

## 2013-03-05 DIAGNOSIS — E039 Hypothyroidism, unspecified: Secondary | ICD-10-CM

## 2013-03-05 NOTE — Progress Notes (Signed)
Pt came in for labs only 

## 2013-03-06 LAB — THYROID PANEL WITH TSH: TSH: 0.177 u[IU]/mL — ABNORMAL LOW (ref 0.450–4.500)

## 2013-03-13 ENCOUNTER — Telehealth: Payer: Self-pay | Admitting: *Deleted

## 2013-03-13 NOTE — Telephone Encounter (Signed)
Message copied by Bearl Mulberry on Thu Mar 13, 2013 10:29 AM ------      Message from: Ernestina Penna      Created: Thu Mar 06, 2013  7:34 AM       The TSH remains decreased but improved over what it was one month ago      And we will need to reduce the thyroid medicine and some more and recheck the levels again in a couple of months      Confirm current dosage and frequency      Then let me note and we will discuss how to reduce the medicine further++++++++ ------

## 2013-03-13 NOTE — Telephone Encounter (Signed)
Pt is currently taking Levothyroxine daily except 1/2 on Sat and Sun Please advise

## 2013-03-14 ENCOUNTER — Telehealth: Payer: Self-pay | Admitting: Family Medicine

## 2013-03-17 ENCOUNTER — Telehealth: Payer: Self-pay | Admitting: *Deleted

## 2013-03-17 NOTE — Telephone Encounter (Signed)
Pt is currently taking Levothyroxine daily except 1/2 tablet on Saturday and Sunday Please advise

## 2013-03-17 NOTE — Telephone Encounter (Signed)
Decrease current Synthroid to a half a one on Monday Wednesday and Friday alcohol and all other days and recheck thyroid in 2-3 month

## 2013-03-17 NOTE — Telephone Encounter (Signed)
Pt notified to take 1/2 tablet on M W FRI and recheck labs in 6 weeks

## 2013-03-24 ENCOUNTER — Encounter: Payer: Self-pay | Admitting: Family Medicine

## 2013-03-24 ENCOUNTER — Telehealth: Payer: Self-pay | Admitting: *Deleted

## 2013-03-24 ENCOUNTER — Ambulatory Visit (INDEPENDENT_AMBULATORY_CARE_PROVIDER_SITE_OTHER): Payer: BC Managed Care – PPO | Admitting: Family Medicine

## 2013-03-24 VITALS — BP 150/90 | HR 80 | Temp 97.4°F | Wt 189.0 lb

## 2013-03-24 DIAGNOSIS — J309 Allergic rhinitis, unspecified: Secondary | ICD-10-CM

## 2013-03-24 DIAGNOSIS — L259 Unspecified contact dermatitis, unspecified cause: Secondary | ICD-10-CM | POA: Insufficient documentation

## 2013-03-24 DIAGNOSIS — J45909 Unspecified asthma, uncomplicated: Secondary | ICD-10-CM

## 2013-03-24 DIAGNOSIS — I10 Essential (primary) hypertension: Secondary | ICD-10-CM

## 2013-03-24 MED ORDER — LOSARTAN POTASSIUM-HCTZ 100-12.5 MG PO TABS: 1.0000 | ORAL_TABLET | Freq: Every day | ORAL | Status: DC

## 2013-03-24 MED ORDER — TRIAMCINOLONE ACETONIDE 0.05 % EX OINT: 1.0000 "application " | TOPICAL_OINTMENT | Freq: Two times a day (BID) | CUTANEOUS | Status: DC

## 2013-03-24 NOTE — Telephone Encounter (Signed)
Spoke with pt regarding BP med change Notified pt that the change was due to insurance denying Benicar

## 2013-03-24 NOTE — Progress Notes (Signed)
Patient ID: Sarah Garrett, female   DOB: 1960-11-27, 52 y.o.   MRN: [id number] SUBJECTIVE: CC: Chief Complaint  Patient presents with  . Acute Visit    ?shingles  neck states wore silver  necklace last week and she noticed this on back of neck c/o itching used otc ointment     HPI: Had a cheap necklace around her neck and it itched and burned and a rash erupted along the crease at the back of the neck. Patient is here for follow up of hypertension: denies Headache;deniesChest Pain;denies weakness;denies Shortness of Breath or Orthopnea;denies Visual changes;denies palpitations;denies cough;denies pedal edema;denies symptoms of TIA or stroke; admits to Compliance with medications. Problems with medications.: approval for Benicar.   Past Medical History  Diagnosis Date  . Hemochromatosis 07/07/2011  . Hyperlipidemia   . Hypertension   . GERD (gastroesophageal reflux disease)   . Anemia   . Thyroid disease   . H/O adenoidectomy   . Melanoma     right shoulder   Past Surgical History  Procedure Laterality Date  . Tonsillectomy and adenoidectomy    . Breast biopsy  2004    left; benign  . Melanoma excision  09/28/11    right shoulder   History   Social History  . Marital Status: Married    Spouse Name: N/A    Number of Children: N/A  . Years of Education: N/A   Occupational History  . Not on file.   Social History Main Topics  . Smoking status: Never Smoker   . Smokeless tobacco: Never Used  . Alcohol Use: No  . Drug Use: No  . Sexual Activity: Not on file   Other Topics Concern  . Not on file   Social History Narrative  . No narrative on file   Family History  Problem Relation Age of Onset  . Colon cancer Neg Hx   . Stomach cancer Neg Hx    Current Outpatient Prescriptions on File Prior to Visit  Medication Sig Dispense Refill  . albuterol (PROVENTIL HFA;VENTOLIN HFA) 108 (90 BASE) MCG/ACT inhaler Inhale 2 puffs into the lungs every 6 (six) hours as  needed.  18 g  4  . atorvastatin (LIPITOR) 40 MG tablet Take 1 tablet (40 mg total) by mouth at bedtime.  30 tablet  4  . cetirizine (ZYRTEC) 10 MG tablet Take 10 mg by mouth daily.        Marland Kitchen esomeprazole (NEXIUM) 40 MG capsule Take 1 capsule (40 mg total) by mouth as needed.  30 capsule  4  . ezetimibe (ZETIA) 10 MG tablet Take 1 tablet (10 mg total) by mouth daily. Takes 1/2 tablet daily  30 tablet  4  . Fluticasone-Salmeterol (ADVAIR DISKUS) 100-50 MCG/DOSE AEPB Inhale 1 puff into the lungs daily as needed.  60 each  4  . furosemide (LASIX) 40 MG tablet Take 0.5 tablets (20 mg total) by mouth daily.  30 tablet  4  . hyoscyamine (HYOMAX-SL) 0.125 MG SL tablet Place 1 tablet (0.125 mg total) under the tongue as needed. Place 1-2 tablet under the tongue until it dissolves as needed  30 tablet  2  . levothyroxine (SYNTHROID, LEVOTHROID) 125 MCG tablet Take 1 tablet (125 mcg total) by mouth daily before breakfast.  30 tablet  4  . montelukast (SINGULAIR) 10 MG tablet Take 1 tablet (10 mg total) by mouth at bedtime.  30 tablet  4  . omega-3 acid ethyl esters (LOVAZA) 1 G capsule Take 1  capsule (1 g total) by mouth daily.  30 capsule  4   Current Facility-Administered Medications on File Prior to Visit  Medication Dose Route Frequency Provider Last Rate Last Dose  . methylPREDNISolone acetate (DEPO-MEDROL) injection 80 mg  80 mg Intramuscular Once Doree Albee, MD       Allergies  Allergen Reactions  . Ace Inhibitors     Per WG:NFAOZHY  . Actos [Pioglitazone Hydrochloride]     Per pt: unknown  . Aspirin Swelling    Swelling of mouth and throat  . Ciprofloxacin   . Crestor [Rosuvastatin Calcium]     Per pt: unknown  . Sulfonamide Derivatives     Per pt: unknown    There is no immunization history on file for this patient. Prior to Admission medications   Medication Sig Start Date End Date Taking? Authorizing Provider  albuterol (PROVENTIL HFA;VENTOLIN HFA) 108 (90 BASE) MCG/ACT inhaler  Inhale 2 puffs into the lungs every 6 (six) hours as needed. 01/22/13   Ernestina Penna, MD  atorvastatin (LIPITOR) 40 MG tablet Take 1 tablet (40 mg total) by mouth at bedtime. 01/22/13   Ernestina Penna, MD  cetirizine (ZYRTEC) 10 MG tablet Take 10 mg by mouth daily.      Historical Provider, MD  esomeprazole (NEXIUM) 40 MG capsule Take 1 capsule (40 mg total) by mouth as needed. 01/22/13   Ernestina Penna, MD  ezetimibe (ZETIA) 10 MG tablet Take 1 tablet (10 mg total) by mouth daily. Takes 1/2 tablet daily 01/22/13   Ernestina Penna, MD  Fluticasone-Salmeterol (ADVAIR DISKUS) 100-50 MCG/DOSE AEPB Inhale 1 puff into the lungs daily as needed. 01/22/13   Ernestina Penna, MD  furosemide (LASIX) 40 MG tablet Take 0.5 tablets (20 mg total) by mouth daily. 01/22/13   Ernestina Penna, MD  hyoscyamine (HYOMAX-SL) 0.125 MG SL tablet Place 1 tablet (0.125 mg total) under the tongue as needed. Place 1-2 tablet under the tongue until it dissolves as needed 01/22/13   Ernestina Penna, MD  levothyroxine Erline Levine, LEVOTHROID) 125 MCG tablet Take 1 tablet (125 mcg total) by mouth daily before breakfast. 01/22/13   Ernestina Penna, MD  losartan-hydrochlorothiazide (HYZAAR) 100-12.5 MG per tablet Take 1 tablet by mouth daily. 03/24/13   Ileana Ladd, MD  montelukast (SINGULAIR) 10 MG tablet Take 1 tablet (10 mg total) by mouth at bedtime. 01/22/13   Ernestina Penna, MD  omega-3 acid ethyl esters (LOVAZA) 1 G capsule Take 1 capsule (1 g total) by mouth daily. 01/22/13   Ernestina Penna, MD  TRIAMCINOLONE ACETONIDE, TOP, 0.05 % OINT Apply 1 application topically 2 (two) times daily. To rash twice  A day. For 10 days 03/24/13   Ileana Ladd, MD     ROS: As above in the HPI. All other systems are stable or negative.  OBJECTIVE: APPEARANCE:  Patient in no acute distress.The patient appeared well nourished and normally developed. Acyanotic. Waist: VITAL SIGNS:BP 150/90  Pulse 80  Temp(Src) 97.4 F (36.3 C) (Oral)  Wt 189 lb  (85.73 kg)  BMI 32.71 kg/m2  LMP 11/29/2011 WF obese  SKIN: warm and  Dry with a reddish brown eczematous eruption along the crease of the back of the neck.  HEAD and Neck: without JVD, Head and scalp: normal Eyes:No scleral icterus. Fundi normal, eye movements normal. Ears: Auricle normal, canal normal, Tympanic membranes normal, insufflation normal. Nose: normal Throat: normal Neck & thyroid: normal  CHEST & LUNGS: Chest  wall: normal Lungs: Clear  CVS: Reveals the PMI to be normally located. Regular rhythm, First and Second Heart sounds are normal,  absence of murmurs, rubs or gallops. Peripheral vasculature: Radial pulses: normal Dorsal pedis pulses: normal Posterior pulses: normal  ASSESSMENT: Contact dermatitis  ALLERGIC RHINITIS  HYPERTENSION  Asthma   PLAN:  Meds ordered this encounter  Medications  . TRIAMCINOLONE ACETONIDE, TOP, 0.05 % OINT    Sig: Apply 1 application topically 2 (two) times daily. To rash twice  A day. For 10 days    Dispense:  30 g    Refill:  0  . losartan-hydrochlorothiazide (HYZAAR) 100-12.5 MG per tablet    Sig: Take 1 tablet by mouth daily.    Dispense:  30 tablet    Refill:  5   Avoidance of nickle containing jewelry.  Return in about 2 weeks (around 04/07/2013) for for change in BP meds with Dr Christell Constant.  Shailee Foots P. [place] Charon, M.D.

## 2013-04-02 ENCOUNTER — Telehealth: Payer: Self-pay | Admitting: *Deleted

## 2013-04-02 NOTE — Telephone Encounter (Signed)
If blood pressures are doing well continue the medication that was started by Dr.Wong, with refills

## 2013-04-02 NOTE — Telephone Encounter (Signed)
[employer] denied benicar 40/12.5 needs prior authorization, prior authorization was denied saying pt must try generic or medication they authorize.   Sarah Garrett had to see a Dr for unrelated issue and was seen by Dr [place] Charon  At which time she mentioned this to him and he switched her to losartan and told her to see Dr. Christell Constant for B/P follow up in 2 wks.

## 2013-04-15 ENCOUNTER — Ambulatory Visit: Payer: BC Managed Care – PPO | Admitting: Family Medicine

## 2013-04-16 ENCOUNTER — Ambulatory Visit (INDEPENDENT_AMBULATORY_CARE_PROVIDER_SITE_OTHER): Payer: BC Managed Care – PPO | Admitting: Family Medicine

## 2013-04-16 ENCOUNTER — Encounter: Payer: Self-pay | Admitting: Family Medicine

## 2013-04-16 VITALS — BP 120/80 | HR 92 | Temp 97.5°F | Ht 63.75 in | Wt 189.0 lb

## 2013-04-16 DIAGNOSIS — I10 Essential (primary) hypertension: Secondary | ICD-10-CM

## 2013-04-16 NOTE — Progress Notes (Signed)
Pt to continue to check bp's for 2 weeks and return to clinic for nurse bp check  Continue current medication Try to recheck blood pressures at home Recheck blood pressure by nurse here in a couple of weeks

## 2013-04-16 NOTE — Patient Instructions (Signed)
Recheck blood pressure in 2 weeks by nurse

## 2013-04-24 NOTE — Telephone Encounter (Signed)
Seen on 9/17 by Dr Christell Constant B/P good to continue on med.

## 2013-06-24 ENCOUNTER — Telehealth: Payer: Self-pay | Admitting: Family Medicine

## 2013-06-25 ENCOUNTER — Ambulatory Visit (INDEPENDENT_AMBULATORY_CARE_PROVIDER_SITE_OTHER): Payer: BC Managed Care – PPO

## 2013-06-25 ENCOUNTER — Ambulatory Visit (INDEPENDENT_AMBULATORY_CARE_PROVIDER_SITE_OTHER): Payer: BC Managed Care – PPO | Admitting: Family Medicine

## 2013-06-25 ENCOUNTER — Encounter: Payer: Self-pay | Admitting: Family Medicine

## 2013-06-25 VITALS — BP 130/86 | HR 95 | Temp 99.5°F | Ht 63.75 in | Wt 193.0 lb

## 2013-06-25 DIAGNOSIS — I10 Essential (primary) hypertension: Secondary | ICD-10-CM

## 2013-06-25 DIAGNOSIS — E559 Vitamin D deficiency, unspecified: Secondary | ICD-10-CM

## 2013-06-25 DIAGNOSIS — E785 Hyperlipidemia, unspecified: Secondary | ICD-10-CM

## 2013-06-25 DIAGNOSIS — J45909 Unspecified asthma, uncomplicated: Secondary | ICD-10-CM

## 2013-06-25 DIAGNOSIS — E890 Postprocedural hypothyroidism: Secondary | ICD-10-CM

## 2013-06-25 DIAGNOSIS — Z23 Encounter for immunization: Secondary | ICD-10-CM

## 2013-06-25 MED ORDER — ALBUTEROL SULFATE HFA 108 (90 BASE) MCG/ACT IN AERS: 2.0000 | INHALATION_SPRAY | Freq: Four times a day (QID) | RESPIRATORY_TRACT | Status: DC | PRN

## 2013-06-25 NOTE — Patient Instructions (Addendum)
Continue current medications. Continue good therapeutic lifestyle changes which include good diet and exercise. Fall precautions discussed with patient. Schedule your flu vaccine if you haven't had it yet If you are over 54 years old - you may need Prevnar 13 or the adult Pneumonia vaccine. Please return to clinic and get your lab work done. We will call you with the results once those results are available. Will send a copy to Dr. Lamar Blinks We will try to get a copy of the office visit to [facility] sports medicine and orthopedics about 2 years ago when he had x-rays of your back You will receive a flu shot today. We will have you return to clinic in a couple weeks and get the Prevnar. You must check with your insurance to see if it will pay for the Prevnar shot Since you had this heavy period greater than one year after your previous period.You should call the gynecologist and make sure that they do not need to see you before January

## 2013-06-25 NOTE — Progress Notes (Signed)
Subjective:    Patient ID: Sarah Garrett, female    DOB: [birth year], 52 y.o.   MRN: [id number]  HPI Pt here for follow up and management of chronic medical problems. Patient has had some aching in both legs and this seems to be going on for 2-3 months. Her breathing is stable. She sees a hematologist about every 6 months. She is due several things on her health maintenance she is due her mammogram, her Pap smear, her chest x-ray, a flu shot, and the Prevnar and DTaP. He apparently has a history of degenerative disc disease at L5 and S1 and has been seen by the orthopedist about this.       Patient Active Problem List   Diagnosis Date Noted  . Contact dermatitis 03/24/2013  . Asthma 03/24/2013  . Melanoma of upper arm, right. T1a., History of 09/07/2011  . Hemochromatosis 07/07/2011  . 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 HYPERLIPIDEMIA 10/11/2007  . ANEMIA-NOS 10/11/2007  . HYPERTENSION 10/11/2007  . ALLERGIC RHINITIS 10/11/2007  . BRONCHITIS, CHRONIC 10/11/2007  . GERD 10/11/2007  . ADENOIDECTOMY, HX OF 10/11/2007   Outpatient Encounter Prescriptions as of 06/25/2013  Medication Sig  . albuterol (PROVENTIL HFA;VENTOLIN HFA) 108 (90 BASE) MCG/ACT inhaler Inhale 2 puffs into the lungs every 6 (six) hours as needed.  Marland Kitchen atorvastatin (LIPITOR) 40 MG tablet Take 1 tablet (40 mg total) by mouth at bedtime.  . cetirizine (ZYRTEC) 10 MG tablet Take 10 mg by mouth daily.    Marland Kitchen esomeprazole (NEXIUM) 40 MG capsule Take 1 capsule (40 mg total) by mouth as needed.  . ezetimibe (ZETIA) 10 MG tablet Take 1 tablet (10 mg total) by mouth daily. Takes 1/2 tablet daily  . Fluticasone-Salmeterol (ADVAIR DISKUS) 100-50 MCG/DOSE AEPB Inhale 1 puff into the lungs daily as needed.  . furosemide (LASIX) 40 MG tablet Take 0.5 tablets (20 mg total) by mouth daily.  . hyoscyamine (HYOMAX-SL) 0.125 MG SL tablet Place 1 tablet (0.125 mg total) under the tongue as needed. Place 1-2 tablet under  the tongue until it dissolves as needed  . levothyroxine (SYNTHROID, LEVOTHROID) 125 MCG tablet Take 1 tablet (125 mcg total) by mouth daily before breakfast.  . losartan-hydrochlorothiazide (HYZAAR) 100-12.5 MG per tablet Take 1 tablet by mouth daily. As directed  . montelukast (SINGULAIR) 10 MG tablet Take 1 tablet (10 mg total) by mouth at bedtime.  Marland Kitchen omega-3 acid ethyl esters (LOVAZA) 1 G capsule Take 1 capsule (1 g total) by mouth daily.  . [DISCONTINUED] losartan-hydrochlorothiazide (HYZAAR) 100-12.5 MG per tablet Take 1 tablet by mouth daily.  . [DISCONTINUED] TRIAMCINOLONE ACETONIDE, TOP, 0.05 % OINT Apply 1 application topically 2 (two) times daily. To rash twice  A day. For 10 days  . [DISCONTINUED] methylPREDNISolone acetate (DEPO-MEDROL) injection 80 mg     Review of Systems  Constitutional: Negative.   HENT: Negative.   Eyes: Negative.   Respiratory: Negative.   Cardiovascular: Negative.   Gastrointestinal: Negative.   Endocrine: Negative.   Genitourinary: Positive for vaginal bleeding (havent had but 1 in the last year).  Musculoskeletal: Positive for back pain and myalgias (legs hurt bad- worse at night).  Skin: Negative.   Allergic/Immunologic: Negative.   Neurological: Negative.   Hematological: Negative.   Psychiatric/Behavioral: Negative.        Objective:   Physical Exam  Nursing note and vitals reviewed. Constitutional: She is oriented to person, place, and time. She appears well-developed and well-nourished. No distress.  HENT:  Head:  Normocephalic and atraumatic.  Right Ear: External ear normal.  Left Ear: External ear normal.  Nose: Nose normal.  Mouth/Throat: Oropharynx is clear and moist. No oropharyngeal exudate.  Eyes: Conjunctivae and EOM are normal. Pupils are equal, round, and reactive to light. Right eye exhibits no discharge. Left eye exhibits no discharge. No scleral icterus.  Neck: Normal range of motion. Neck supple. No JVD present. No  thyromegaly present.  No carotid bruits  Cardiovascular: Normal rate, regular rhythm and intact distal pulses.  Exam reveals no friction rub.   No murmur heard. At 72 per min  Pulmonary/Chest: Effort normal and breath sounds normal. No respiratory distress. She has no wheezes. She has no rales. She exhibits no tenderness.  Abdominal: Soft. Bowel sounds are normal. She exhibits no distension and no mass. There is no tenderness. There is no rebound and no guarding.  Musculoskeletal: Normal range of motion. She exhibits no edema and no tenderness.  Lymphadenopathy:    She has no cervical adenopathy.  Neurological: She is alert and oriented to person, place, and time. She has normal reflexes. No cranial nerve deficit.  Skin: Skin is warm and dry.  Psychiatric: She has a normal mood and affect. Her behavior is normal. Judgment and thought content normal.   BP 130/86  Pulse 95  Temp(Src) 99.5 F (37.5 C) (Oral)  Ht 5' 3.75" (1.619 m)  Wt 193 lb (87.544 kg)  BMI 33.40 kg/m2  LMP 11/29/2011  [facility] reading (PRIMARY) by  Dr.Moore--chest x-ray --within normal limits                                       Assessment & Plan:   1. HYPERTENSION   2. HYPERLIPIDEMIA   3. Hemochromatosis   4. Asthma   5. Other postablative hypothyroidism   6. Vitamin D deficiency   7. Need for Tdap vaccination   8. Need for prophylactic vaccination and inoculation against influenza    Orders Placed This Encounter  Procedures  . DG Chest 2 View    Standing Status: Future     Number of Occurrences: 1     Standing Expiration Date: 08/25/2014    Order Specific Question:  Reason for Exam (SYMPTOM  OR DIAGNOSIS REQUIRED)    Answer:  htn    Order Specific Question:  Is the patient pregnant?    Answer:  No    Order Specific Question:  Preferred imaging location?    Answer:  Internal  . Hepatic function panel    Standing Status: Future     Number of Occurrences:      Standing Expiration Date: 06/25/2014  .  BMP8+EGFR    Standing Status: Future     Number of Occurrences:      Standing Expiration Date: 06/25/2014  . NMR, lipoprofile    Standing Status: Future     Number of Occurrences:      Standing Expiration Date: 06/25/2014  . Vit D  25 hydroxy (rtn osteoporosis monitoring)    Standing Status: Future     Number of Occurrences:      Standing Expiration Date: 06/25/2014  . Thyroid Panel With TSH    Standing Status: Future     Number of Occurrences:      Standing Expiration Date: 06/25/2014  . POCT CBC    Standing Status: Future     Number of Occurrences:  Standing Expiration Date: 07/25/2013   Meds ordered this encounter  Medications  . losartan-hydrochlorothiazide (HYZAAR) 100-12.5 MG per tablet    Sig: Take 1 tablet by mouth daily. As directed   Patient Instructions  Continue current medications. Continue good therapeutic lifestyle changes which include good diet and exercise. Fall precautions discussed with patient. Schedule your flu vaccine if you haven't had it yet If you are over 57 years old - you may need Prevnar 13 or the adult Pneumonia vaccine. Please return to clinic and get your lab work done. We will call you with the results once those results are available. Will send a copy to Dr. Lamar Blinks We will try to get a copy of the office visit to [facility] sports medicine and orthopedics about 2 years ago when he had x-rays of your back You will receive a flu shot today. We will have you return to clinic in a couple weeks and get the Prevnar. You must check with your insurance to see if it will pay for the Prevnar shot Since you had this heavy period greater than one year after your previous period.You should call the gynecologist and make sure that they do not need to see you before January   Nyra Capes MD

## 2013-07-01 NOTE — Addendum Note (Signed)
Addended by: Magdalene River on: 07/01/2013 05:06 PM   Modules accepted: Orders

## 2013-07-02 ENCOUNTER — Other Ambulatory Visit (INDEPENDENT_AMBULATORY_CARE_PROVIDER_SITE_OTHER): Payer: BC Managed Care – PPO

## 2013-07-02 DIAGNOSIS — E559 Vitamin D deficiency, unspecified: Secondary | ICD-10-CM

## 2013-07-02 DIAGNOSIS — Z23 Encounter for immunization: Secondary | ICD-10-CM

## 2013-07-02 DIAGNOSIS — E785 Hyperlipidemia, unspecified: Secondary | ICD-10-CM

## 2013-07-02 DIAGNOSIS — I10 Essential (primary) hypertension: Secondary | ICD-10-CM

## 2013-07-02 DIAGNOSIS — J45909 Unspecified asthma, uncomplicated: Secondary | ICD-10-CM

## 2013-07-02 DIAGNOSIS — E890 Postprocedural hypothyroidism: Secondary | ICD-10-CM

## 2013-07-02 LAB — POCT CBC
Granulocyte percent: 66.8 %G (ref 37–80)
Hemoglobin: 15.3 g/dL (ref 12.2–16.2)
Lymph, poc: 2.2 (ref 0.6–3.4)
MCHC: 33.7 g/dL (ref 31.8–35.4)
MPV: 7.7 fL (ref 0–99.8)
POC Granulocyte: 4.9 (ref 2–6.9)
POC LYMPH PERCENT: 30.1 %L (ref 10–50)
Platelet Count, POC: 182 10*3/uL (ref 142–424)
RDW, POC: 12.7 %
WBC: 7.3 10*3/uL (ref 4.6–10.2)

## 2013-07-02 NOTE — Progress Notes (Signed)
Patient came in for labs only.

## 2013-07-04 LAB — THYROID PANEL WITH TSH
Free Thyroxine Index: 2.3 (ref 1.2–4.9)
T3 Uptake Ratio: 27 % (ref 24–39)
T4, Total: 8.5 ug/dL (ref 4.5–12.0)

## 2013-07-04 LAB — NMR, LIPOPROFILE
Cholesterol: 225 mg/dL — ABNORMAL HIGH (ref <200)
HDL Cholesterol by NMR: 45 mg/dL (ref >=40)
LDL Particle Number: 2277 nmol/L — ABNORMAL HIGH (ref <1000)
LDLC SERPL CALC-MCNC: 144 mg/dL — ABNORMAL HIGH (ref <100)
LP-IR Score: 80 — ABNORMAL HIGH (ref <=45)
Small LDL Particle Number: 1250 nmol/L — ABNORMAL HIGH (ref <=527)
Triglycerides by NMR: 178 mg/dL — ABNORMAL HIGH (ref <150)

## 2013-07-04 LAB — BMP8+EGFR
BUN: 12 mg/dL (ref 6–24)
CO2: 28 mmol/L (ref 18–29)
Creatinine, Ser: 0.79 mg/dL (ref 0.57–1.00)
GFR calc Af Amer: 98 mL/min/{1.73_m2} (ref >59)
GFR calc non Af Amer: 85 mL/min/{1.73_m2} (ref >59)
Glucose: 97 mg/dL (ref 65–99)

## 2013-07-04 LAB — HEPATIC FUNCTION PANEL
AST: 18 IU/L (ref 0–40)
Albumin: 4.5 g/dL (ref 3.5–5.5)
Bilirubin, Direct: 0.14 mg/dL (ref 0.00–0.40)
Total Bilirubin: 0.5 mg/dL (ref 0.0–1.2)
Total Protein: 7 g/dL (ref 6.0–8.5)

## 2013-07-09 ENCOUNTER — Ambulatory Visit (INDEPENDENT_AMBULATORY_CARE_PROVIDER_SITE_OTHER): Payer: BC Managed Care – PPO | Admitting: *Deleted

## 2013-07-09 DIAGNOSIS — Z23 Encounter for immunization: Secondary | ICD-10-CM

## 2013-07-16 ENCOUNTER — Ambulatory Visit (HOSPITAL_BASED_OUTPATIENT_CLINIC_OR_DEPARTMENT_OTHER): Payer: BC Managed Care – PPO | Admitting: Hematology & Oncology

## 2013-07-16 ENCOUNTER — Other Ambulatory Visit (HOSPITAL_BASED_OUTPATIENT_CLINIC_OR_DEPARTMENT_OTHER): Payer: BC Managed Care – PPO | Admitting: Lab

## 2013-07-16 LAB — CBC WITH DIFFERENTIAL (CANCER CENTER ONLY)
EOS%: 10.2 % — ABNORMAL HIGH (ref 0.0–7.0)
Eosinophils Absolute: 0.8 10*3/uL — ABNORMAL HIGH (ref 0.0–0.5)
HCT: 45.3 % (ref 34.8–46.6)
LYMPH%: 31.1 % (ref 14.0–48.0)
MCV: 91 fL (ref 81–101)
MONO#: 0.4 10*3/uL (ref 0.1–0.9)
NEUT#: 3.9 10*3/uL (ref 1.5–6.5)
NEUT%: 52.7 % (ref 39.6–80.0)
RBC: 4.97 10*6/uL (ref 3.70–5.32)
WBC: 7.4 10*3/uL (ref 3.9–10.0)

## 2013-07-16 LAB — IRON AND TIBC CHCC
%SAT: 34 % (ref 21–57)
Iron: 119 ug/dL (ref 41–142)
UIBC: 232 ug/dL (ref 120–384)

## 2013-07-16 NOTE — Progress Notes (Signed)
This office note has been dictated.

## 2013-07-18 ENCOUNTER — Telehealth: Payer: Self-pay | Admitting: *Deleted

## 2013-07-18 NOTE — Telephone Encounter (Signed)
Message copied by Anselm Jungling on Fri Jul 18, 2013  5:20 PM ------      Message from: Arlan Organ R      Created: Wed Jul 16, 2013  1:37 PM       Call - ferritin is 54!!  No need for phlebotomy!!  Merry Christmas!!  Cindee Lame ------

## 2013-07-18 NOTE — Progress Notes (Signed)
CC:   Sarah Garrett, M.D. Frederick A. Worthy Rancher, M.D.  DIAGNOSES: 1. 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 2. History of stage IA melanoma of the right __________.  CURRENT THERAPY:  Phlebotomy to maintain ferritin less than 100.  INTERIM HISTORY:  Sarah Garrett comes in for followup.  We see her every 6 months.  She has been doing fairly well.  She is still working.  She has not been able to cover all that much.  She is having some problems with her lower legs.  She does have some back issues.  She sees Dr. Ranell Patrick for this.  When we last saw her, her ferritin was only 52 with an iron saturation of 31%.  She has had no cough or shortness of breath.  She has allergies right now.  There has been no change in bowel or bladder habits.  She did have a monthly cycle.  This was like a first one in over a year. This was a regular one.  She has not had one since.  She has had no fever, sweats, or chills.  There has been no change in medications.  She is on thyroid medicine for hypothyroidism.  PHYSICAL EXAMINATION:  General:  This is a well-developed, well- nourished white female, in no obvious distress.  Vital Signs: Temperature of 98.1, pulse 82, respiratory rate 14, blood pressure 133/69, weight is 192 pounds.  Head and Neck:  Normocephalic, atraumatic skull.  There are no ocular or oral lesions.  There are no palpable cervical or supraclavicular lymph nodes.  Lungs:  Clear bilaterally. Cardiac:  Regular rate and rhythm with a normal S1, S2.  There are no murmurs, rubs, or bruits.  Abdomen:  Soft.  She has good bowel sounds. There is no fluid wave.  There is no palpable abdominal mass.  There is no palpable hepatosplenomegaly.  Back:  No tenderness over the spine, ribs, or hips.  Extremities:  No clubbing, cyanosis, or edema. Neurologic:  No focal neurological deficits.  Skin:  Fair skin.  She has hyperpigmented lesions, but none that looked  suspicious.  LABORATORY STUDIES:  White cell count is 7.4, hemoglobin 15.4, hematocrit 45.3, platelet count 181.  MCV is 91.  IMPRESSION:  Sarah Garrett is a very nice 52 year old white female with history of hemochromatosis.  Again, she is a double heterozygote.  We have not had to phlebotomize her for, I think, over 2 years or more.  We will continue to see her back every 6 months.  I do not see any need for additional lab work or x-ray studies.    ______________________________ Josph Macho, M.D. PRE/MEDQ  D:  07/16/2013  T:  07/17/2013  Job:  [id number]

## 2013-07-21 ENCOUNTER — Other Ambulatory Visit: Payer: Self-pay | Admitting: Family Medicine

## 2013-07-26 ENCOUNTER — Other Ambulatory Visit: Payer: Self-pay | Admitting: Family Medicine

## 2013-08-13 ENCOUNTER — Other Ambulatory Visit: Payer: Self-pay | Admitting: Obstetrics and Gynecology

## 2013-08-13 DIAGNOSIS — R928 Other abnormal and inconclusive findings on diagnostic imaging of breast: Secondary | ICD-10-CM

## 2013-08-21 ENCOUNTER — Telehealth: Payer: Self-pay | Admitting: *Deleted

## 2013-08-21 ENCOUNTER — Other Ambulatory Visit: Payer: Self-pay | Admitting: Obstetrics and Gynecology

## 2013-08-21 ENCOUNTER — Ambulatory Visit
Admission: RE | Admit: 2013-08-21 | Discharge: 2013-08-21 | Disposition: A | Discharge status: Home / Self Care | Payer: BC Managed Care – PPO | Source: Ambulatory Visit | Attending: Obstetrics and Gynecology | Admitting: Obstetrics and Gynecology

## 2013-08-21 DIAGNOSIS — R928 Other abnormal and inconclusive findings on diagnostic imaging of breast: Secondary | ICD-10-CM

## 2013-08-21 NOTE — Telephone Encounter (Signed)
Patient should come in and be seen. She needs a CBC. Diverticulitis is a very serious problem and needs to be taken care of properly.

## 2013-08-21 NOTE — Telephone Encounter (Signed)
Pt states that her stomach feels like diverticulitis. This felt this way last year arouns this time- and the antibiotics needed were called. Can we call in to [facility]. She wants to get on something before it gets too bad.

## 2013-08-21 NOTE — Telephone Encounter (Signed)
appt made with [facility] tomorrow

## 2013-08-22 ENCOUNTER — Ambulatory Visit (HOSPITAL_COMMUNITY)
Admission: RE | Admit: 2013-08-22 | Discharge: 2013-08-22 | Disposition: A | Discharge status: Home / Self Care | Payer: BC Managed Care – PPO | Source: Ambulatory Visit | Attending: Family Medicine | Admitting: Family Medicine

## 2013-08-22 ENCOUNTER — Ambulatory Visit (INDEPENDENT_AMBULATORY_CARE_PROVIDER_SITE_OTHER): Payer: BC Managed Care – PPO | Admitting: Family Medicine

## 2013-08-22 ENCOUNTER — Encounter: Payer: Self-pay | Admitting: Family Medicine

## 2013-08-22 VITALS — BP 117/83 | HR 67 | Temp 97.9°F | Ht 63.75 in | Wt 190.0 lb

## 2013-08-22 DIAGNOSIS — R109 Unspecified abdominal pain: Secondary | ICD-10-CM

## 2013-08-22 DIAGNOSIS — R933 Abnormal findings on diagnostic imaging of other parts of digestive tract: Secondary | ICD-10-CM | POA: Insufficient documentation

## 2013-08-22 DIAGNOSIS — K7689 Other specified diseases of liver: Secondary | ICD-10-CM | POA: Insufficient documentation

## 2013-08-22 DIAGNOSIS — Z8719 Personal history of other diseases of the digestive system: Secondary | ICD-10-CM | POA: Insufficient documentation

## 2013-08-22 DIAGNOSIS — K5732 Diverticulitis of large intestine without perforation or abscess without bleeding: Secondary | ICD-10-CM

## 2013-08-22 LAB — POCT UA - MICROSCOPIC ONLY
BACTERIA, U MICROSCOPIC: NEGATIVE
CASTS, UR, LPF, POC: NEGATIVE
CRYSTALS, UR, HPF, POC: NEGATIVE
Mucus, UA: NEGATIVE
RBC, urine, microscopic: NEGATIVE
YEAST UA: NEGATIVE

## 2013-08-22 LAB — POCT CBC
GRANULOCYTE PERCENT: 64.2 % (ref 37–80)
HCT, POC: 46.2 % (ref 37.7–47.9)
HEMOGLOBIN: 15.2 g/dL (ref 12.2–16.2)
LYMPH, POC: 2.3 (ref 0.6–3.4)
MCH, POC: 29.9 pg (ref 27–31.2)
MCHC: 32.9 g/dL (ref 31.8–35.4)
MCV: 90.8 fL (ref 80–97)
MPV: 7.8 fL (ref 0–99.8)
PLATELET COUNT, POC: 190 10*3/uL (ref 142–424)
POC Granulocyte: 5 (ref 2–6.9)
POC LYMPH PERCENT: 29.5 %L (ref 10–50)
RBC: 5.1 M/uL (ref 4.04–5.48)
RDW, POC: 13 %
WBC: 7.8 10*3/uL (ref 4.6–10.2)

## 2013-08-22 LAB — POCT URINALYSIS DIPSTICK
Bilirubin, UA: NEGATIVE
Glucose, UA: NEGATIVE
Ketones, UA: NEGATIVE
NITRITE UA: NEGATIVE
PH UA: 7.5
PROTEIN UA: NEGATIVE
RBC UA: NEGATIVE
SPEC GRAV UA: 1.01
Urobilinogen, UA: NEGATIVE

## 2013-08-22 MED ORDER — IOHEXOL 300 MG/ML  SOLN
100.0000 mL | Freq: Once | INTRAMUSCULAR | Status: AC | PRN
  Administered 2013-08-22: 100 mL via INTRAVENOUS

## 2013-08-22 MED ORDER — CIPROFLOXACIN HCL 500 MG PO TABS: 500.0000 mg | ORAL_TABLET | Freq: Two times a day (BID) | ORAL | Status: DC

## 2013-08-22 MED ORDER — METRONIDAZOLE 500 MG PO TABS: 500.0000 mg | ORAL_TABLET | Freq: Three times a day (TID) | ORAL | Status: DC

## 2013-08-22 NOTE — Progress Notes (Signed)
   Subjective:    Patient ID: Sarah Garrett, female    DOB: 1960-09-09, 53 y.o.   MRN: [id number]  HPI ABDOMINAL PAIN Location: lower abdomen  Onset: 2 days  Description: progressive lower abdominal pain. Painful defecation. No dysuria  Modifying factors: prior hx/o diveticulitis. Sxs consistent with prior flares. Did eat popcorn this week.     Symptoms Nausea/Vomiting: no Diarrhea: no Constipation: no Melena/BRBPR: no Hematemesis: no Anorexia: no Fever/Chills: minimal  Jaundice: no Dysuria: no Back pain: no Rash: no Weight loss: no Vaginal bleeding: no STD exposure: no LMP: no Alcohol use: no NSAID use: no  PMH Past Surgeries: no      Review of Systems  All other systems reviewed and are negative.       Objective:   Physical Exam  Constitutional: She is oriented to person, place, and time. She appears well-developed and well-nourished.  HENT:  Head: Normocephalic and atraumatic.  Eyes: Conjunctivae are normal. Pupils are equal, round, and reactive to light.  Neck: Normal range of motion. Neck supple.  Cardiovascular: Normal rate and regular rhythm.   Pulmonary/Chest: Effort normal and breath sounds normal.  Abdominal: Soft.  + lower abdominal TTP > 10 + guarding.    Neurological: She is alert and oriented to person, place, and time.  Skin: Skin is warm.    Lab Results  Component Value Date   WBC 7.8 08/22/2013   HGB 15.2 08/22/2013   HCT 46.2 08/22/2013   MCV 90.8 08/22/2013   PLT 181 07/16/2013         Assessment & Plan:  Abdominal pressure - Plan: POCT UA - Microscopic Only, POCT urinalysis dipstick, POCT CBC, CT Abdomen Pelvis W Contrast  Diverticulitis of colon (without mention of hemorrhage) - Plan: ciprofloxacin (CIPRO) 500 MG tablet, metroNIDAZOLE (FLAGYL) 500 MG tablet, CT Abdomen Pelvis W Contrast  Fairly significant amount of pain on exam today No leukocytosis.  Discussed imaging, pt would like to proceed.  Will start pt on  cipro and flagyl for coverage ( ? cipro allergy in chart-pt denies any hx/o cipro allergy in the past).  Clear liquid diet. Avoid nuts and seeds including popcorn.  Discussed infectious and GI red flags at length.  Follow up as needed.

## 2013-08-25 ENCOUNTER — Ambulatory Visit
Admission: RE | Admit: 2013-08-25 | Discharge: 2013-08-25 | Disposition: A | Discharge status: Home / Self Care | Payer: BC Managed Care – PPO | Source: Ambulatory Visit | Attending: Obstetrics and Gynecology | Admitting: Obstetrics and Gynecology

## 2013-08-25 DIAGNOSIS — R928 Other abnormal and inconclusive findings on diagnostic imaging of breast: Secondary | ICD-10-CM

## 2013-08-27 ENCOUNTER — Ambulatory Visit: Payer: BC Managed Care – PPO | Admitting: Family Medicine

## 2013-09-26 ENCOUNTER — Other Ambulatory Visit: Payer: Self-pay | Admitting: Family Medicine

## 2013-10-16 ENCOUNTER — Other Ambulatory Visit (INDEPENDENT_AMBULATORY_CARE_PROVIDER_SITE_OTHER): Payer: BC Managed Care – PPO

## 2013-10-16 DIAGNOSIS — E039 Hypothyroidism, unspecified: Secondary | ICD-10-CM

## 2013-10-16 DIAGNOSIS — R5383 Other fatigue: Secondary | ICD-10-CM

## 2013-10-16 DIAGNOSIS — E559 Vitamin D deficiency, unspecified: Secondary | ICD-10-CM

## 2013-10-16 DIAGNOSIS — R5381 Other malaise: Secondary | ICD-10-CM

## 2013-10-16 DIAGNOSIS — E785 Hyperlipidemia, unspecified: Secondary | ICD-10-CM

## 2013-10-16 LAB — POCT CBC
Granulocyte percent: 65.7 %G (ref 37–80)
HCT, POC: 46.8 % (ref 37.7–47.9)
Hemoglobin: 15.2 g/dL (ref 12.2–16.2)
LYMPH, POC: 2.3 (ref 0.6–3.4)
MCH: 29.2 pg (ref 27–31.2)
MCHC: 32.4 g/dL (ref 31.8–35.4)
MCV: 90.2 fL (ref 80–97)
MPV: 8.2 fL (ref 0–99.8)
POC Granulocyte: 4.8 (ref 2–6.9)
POC LYMPH PERCENT: 31.5 %L (ref 10–50)
Platelet Count, POC: 180 10*3/uL (ref 142–424)
RBC: 5.2 M/uL (ref 4.04–5.48)
RDW, POC: 13.4 %
WBC: 7.3 10*3/uL (ref 4.6–10.2)

## 2013-10-16 NOTE — Progress Notes (Signed)
Labs only

## 2013-10-18 LAB — NMR, LIPOPROFILE
Cholesterol: 227 mg/dL — ABNORMAL HIGH (ref <200)
HDL CHOLESTEROL BY NMR: 47 mg/dL (ref >=40)
HDL PARTICLE NUMBER: 30.1 umol/L — AB (ref >=30.5)
LDL Particle Number: 2107 nmol/L — ABNORMAL HIGH (ref <1000)
LDL Size: 20.5 nm — ABNORMAL LOW (ref >20.5)
LDLC SERPL CALC-MCNC: 136 mg/dL — ABNORMAL HIGH (ref <100)
LP-IR Score: 82 — ABNORMAL HIGH (ref <=45)
SMALL LDL PARTICLE NUMBER: 1257 nmol/L — AB (ref <=527)
TRIGLYCERIDES BY NMR: 219 mg/dL — AB (ref <150)

## 2013-10-18 LAB — BMP8+EGFR
BUN/Creatinine Ratio: 12 (ref 9–23)
BUN: 11 mg/dL (ref 6–24)
CO2: 26 mmol/L (ref 18–29)
CREATININE: 0.92 mg/dL (ref 0.57–1.00)
Calcium: 10.6 mg/dL — ABNORMAL HIGH (ref 8.7–10.2)
Chloride: 98 mmol/L (ref 97–108)
GFR calc Af Amer: 82 mL/min/{1.73_m2} (ref >59)
GFR, EST NON AFRICAN AMERICAN: 71 mL/min/{1.73_m2} (ref >59)
Glucose: 100 mg/dL — ABNORMAL HIGH (ref 65–99)
Potassium: 4.5 mmol/L (ref 3.5–5.2)
SODIUM: 145 mmol/L — AB (ref 134–144)

## 2013-10-18 LAB — HEPATIC FUNCTION PANEL
ALT: 17 IU/L (ref 0–32)
AST: 26 IU/L (ref 0–40)
Albumin: 4.9 g/dL (ref 3.5–5.5)
Alkaline Phosphatase: 92 IU/L (ref 39–117)
BILIRUBIN DIRECT: 0.16 mg/dL (ref 0.00–0.40)
BILIRUBIN TOTAL: 0.7 mg/dL (ref 0.0–1.2)
Total Protein: 7.6 g/dL (ref 6.0–8.5)

## 2013-10-18 LAB — THYROID PANEL WITH TSH
Free Thyroxine Index: 2.7 (ref 1.2–4.9)
T3 Uptake Ratio: 25 % (ref 24–39)
T4, Total: 10.6 ug/dL (ref 4.5–12.0)
TSH: 3.32 u[IU]/mL (ref 0.450–4.500)

## 2013-10-18 LAB — VITAMIN D 25 HYDROXY (VIT D DEFICIENCY, FRACTURES): Vit D, 25-Hydroxy: 32.6 ng/mL (ref 30.0–100.0)

## 2013-10-20 ENCOUNTER — Telehealth: Payer: Self-pay | Admitting: Family Medicine

## 2013-10-20 NOTE — Telephone Encounter (Signed)
Message copied by Waverly Ferrari on Mon Oct 20, 2013 12:00 PM ------      Message from: Chipper Herb      Created: Sun Oct 19, 2013 11:07 AM       The CBC has a normal white blood cell count. The hemoglobin is good at 15.2. Platelet count is adequate.      The blood sugar is slightly elevated at 100. The sodium is slightly elevated at 145 but this is okay. Serum calcium was also slightly elevated and this has been elevated in the past. Please repeat the BMP in a couple of weeks nonfasting++++++ due to elevated calcium      All liver Function tests are within normal limits      All thyroid function tests are within normal limit      The total LDL particle number is elevated at 08/31/2005 this is slightly decreased from 3 months ago but still is very high. The LDL C. is elevated at 136. Triglycerides are elevated at 219. The HDL particle number, which is the good cholesterol is low.------------------ please confirm with the patient that she is taking atorvastatin 40 and ezetimibe 10 and fish oil++++++++???????????? also confirm  that she is watching her diet and exercising regularly??????      The vitamin D level is at the low and of the normal range, have her increase vitamin D3 by 1000 daily ------

## 2013-10-23 ENCOUNTER — Encounter: Payer: Self-pay | Admitting: Family Medicine

## 2013-10-23 ENCOUNTER — Ambulatory Visit (INDEPENDENT_AMBULATORY_CARE_PROVIDER_SITE_OTHER): Payer: BC Managed Care – PPO | Admitting: Family Medicine

## 2013-10-23 VITALS — BP 130/74 | HR 91 | Temp 98.9°F | Ht 63.75 in | Wt 191.0 lb

## 2013-10-23 DIAGNOSIS — I10 Essential (primary) hypertension: Secondary | ICD-10-CM

## 2013-10-23 DIAGNOSIS — K219 Gastro-esophageal reflux disease without esophagitis: Secondary | ICD-10-CM

## 2013-10-23 DIAGNOSIS — E785 Hyperlipidemia, unspecified: Secondary | ICD-10-CM

## 2013-10-23 DIAGNOSIS — J45909 Unspecified asthma, uncomplicated: Secondary | ICD-10-CM

## 2013-10-23 MED ORDER — ROSUVASTATIN CALCIUM 20 MG PO TABS: 20.0000 mg | ORAL_TABLET | Freq: Every day | ORAL | Status: DC

## 2013-10-23 NOTE — Patient Instructions (Addendum)
Continue current medications. Continue good therapeutic lifestyle changes which include good diet and exercise. Fall precautions discussed with patient. If an FOBT was given today- please return it to our front desk. If you are over 55 years old - you may need Prevnar 49 or the adult Pneumonia vaccine.  Drink plenty of fluids   Avoid seeds peanuts etc. that can affect her diverticulitis Be scheduled for a DEXA scan in June Return the FOBT

## 2013-10-23 NOTE — Progress Notes (Signed)
Subjective:    Patient ID: Sarah Garrett, female    DOB: [birth year], 53 y.o.   MRN: [id number]  HPI Pt here for follow up and management of chronic medical problems. The patient does complain of some funny sensations  on her tongue.         Patient Active Problem List   Diagnosis Date Noted  . Contact dermatitis 03/24/2013  . Asthma 03/24/2013  . Melanoma of upper arm, right. T1a., History of 09/07/2011  . Hemochromatosis 07/07/2011  . 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 HYPERLIPIDEMIA 10/11/2007  . ANEMIA-NOS 10/11/2007  . HYPERTENSION 10/11/2007  . ALLERGIC RHINITIS 10/11/2007  . BRONCHITIS, CHRONIC 10/11/2007  . GERD 10/11/2007  . ADENOIDECTOMY, HX OF 10/11/2007   Outpatient Encounter Prescriptions as of 10/23/2013  Medication Sig  . albuterol (PROVENTIL HFA;VENTOLIN HFA) 108 (90 BASE) MCG/ACT inhaler Inhale 2 puffs into the lungs every 6 (six) hours as needed.  Marland Kitchen atorvastatin (LIPITOR) 40 MG tablet Take 1 tablet (40 mg total) by mouth at bedtime.  Marland Kitchen esomeprazole (NEXIUM) 40 MG capsule Take 1 capsule (40 mg total) by mouth as needed.  . ezetimibe (ZETIA) 10 MG tablet Take 1 tablet (10 mg total) by mouth daily. Takes 1/2 tablet daily  . Fluticasone-Salmeterol (ADVAIR DISKUS) 100-50 MCG/DOSE AEPB Inhale 1 puff into the lungs daily as needed.  . furosemide (LASIX) 40 MG tablet Take 0.5 tablets (20 mg total) by mouth daily.  . hyoscyamine (HYOMAX-SL) 0.125 MG SL tablet Place 1 tablet (0.125 mg total) under the tongue as needed. Place 1-2 tablet under the tongue until it dissolves as needed  . levothyroxine (SYNTHROID, LEVOTHROID) 125 MCG tablet Take 125 mcg by mouth daily before breakfast. TAKES 1/2 TAB TWO DAYS A WEEK.  Marland Kitchen losartan-hydrochlorothiazide (HYZAAR) 100-12.5 MG per tablet Take 1 tablet by mouth daily. As directed  . montelukast (SINGULAIR) 10 MG tablet TAKE 1 TABLET (10 MG TOTAL) BY MOUTH AT BEDTIME.  Marland Kitchen omega-3 acid ethyl esters (LOVAZA) 1 G capsule  Take 1 capsule (1 g total) by mouth daily.  . [DISCONTINUED] furosemide (LASIX) 40 MG tablet TAKE 1 TABLET EVERY DAY AS NEEDED  . cetirizine (ZYRTEC) 10 MG tablet Take 10 mg by mouth daily.    . [DISCONTINUED] ciprofloxacin (CIPRO) 500 MG tablet Take 1 tablet (500 mg total) by mouth 2 (two) times daily.  . [DISCONTINUED] levothyroxine (SYNTHROID, LEVOTHROID) 125 MCG tablet TAKE 1 TABLET (125 MCG TOTAL) BY MOUTH DAILY BEFORE BREAKFAST.  . [DISCONTINUED] metroNIDAZOLE (FLAGYL) 500 MG tablet Take 1 tablet (500 mg total) by mouth 3 (three) times daily.    Review of Systems  Constitutional: Negative.   HENT: Negative.        Tongue "feels funny"  Eyes: Negative.   Respiratory: Negative.   Cardiovascular: Negative.   Gastrointestinal: Negative.   Endocrine: Negative.   Genitourinary: Negative.   Musculoskeletal: Negative.   Skin: Negative.   Allergic/Immunologic: Negative.   Neurological: Negative.   Hematological: Negative.   Psychiatric/Behavioral: Negative.        Objective:   Physical Exam  Nursing note and vitals reviewed. Constitutional: She is oriented to person, place, and time. She appears well-developed and well-nourished. No distress.  HENT:  Head: Normocephalic and atraumatic.  Right Ear: External ear normal.  Left Ear: External ear normal.  Nose: Nose normal.  Mouth/Throat: Oropharynx is clear and moist.  Eyes: Conjunctivae and EOM are normal. Pupils are equal, round, and reactive to light. Right eye exhibits no discharge. Left eye exhibits  no discharge. No scleral icterus.  Neck: Normal range of motion. Neck supple. No JVD present. No thyromegaly present.  Cardiovascular: Normal rate, regular rhythm, normal heart sounds and intact distal pulses.  Exam reveals no gallop and no friction rub.   No murmur heard. At 72 per minute  Pulmonary/Chest: Effort normal and breath sounds normal. No respiratory distress. She has no wheezes. She has no rales. She exhibits no  tenderness.  No wheezing or asthmatic breath sounds  Abdominal: Soft. Bowel sounds are normal. She exhibits no mass. There is tenderness. There is no rebound and no guarding.  Slight suprapubic tenderness  Musculoskeletal: Normal range of motion. She exhibits no edema and no tenderness.  Lymphadenopathy:    She has no cervical adenopathy.  Neurological: She is alert and oriented to person, place, and time. She has normal reflexes. No cranial nerve deficit.  Skin: Skin is warm and dry.  Dry skin  Psychiatric: She has a normal mood and affect. Her behavior is normal. Judgment and thought content normal.   BP 130/74  Pulse 91  Temp(Src) 98.9 F (37.2 C) (Oral)  Ht 5' 3.75" (1.619 m)  Wt 191 lb (86.637 kg)  BMI 33.05 kg/m2  LMP 11/29/2011        Assessment & Plan:  1. Asthma -Stable  2. HYPERTENSION  3. HYPERLIPIDEMIA -Will change cholesterol treatment to Crestor 10 mg, because of poor control with atorvastatin. -She will have liver function test in 4 weeks  4. GERD  Patient Instructions  Continue current medications. Continue good therapeutic lifestyle changes which include good diet and exercise. Fall precautions discussed with patient. If an FOBT was given today- please return it to our front desk. If you are over 31 years old - you may need Prevnar 15 or the adult Pneumonia vaccine.  Drink plenty of fluids   Avoid seeds peanuts etc. that can affect her diverticulitis Be scheduled for a DEXA scan in June Return the FOBT   Arrie Senate MD

## 2013-10-27 ENCOUNTER — Other Ambulatory Visit: Payer: Self-pay | Admitting: Family Medicine

## 2013-12-23 ENCOUNTER — Telehealth: Payer: Self-pay | Admitting: Family Medicine

## 2013-12-23 MED ORDER — ROSUVASTATIN CALCIUM 20 MG PO TABS: 20.0000 mg | ORAL_TABLET | Freq: Every day | ORAL | Status: DC

## 2013-12-23 NOTE — Telephone Encounter (Signed)
crestor renewed

## 2013-12-26 ENCOUNTER — Other Ambulatory Visit: Payer: Self-pay | Admitting: Family Medicine

## 2013-12-29 ENCOUNTER — Telehealth: Payer: Self-pay | Admitting: Family Medicine

## 2013-12-29 ENCOUNTER — Other Ambulatory Visit: Payer: Self-pay | Admitting: Family Medicine

## 2013-12-29 MED ORDER — LEVOTHYROXINE SODIUM 125 MCG PO TABS: 125.0000 ug | ORAL_TABLET | Freq: Every day | ORAL | Status: DC

## 2013-12-29 MED ORDER — MONTELUKAST SODIUM 10 MG PO TABS: 10.0000 mg | ORAL_TABLET | Freq: Every day | ORAL | Status: DC

## 2013-12-29 NOTE — Telephone Encounter (Signed)
Wanted Singulair sent into pharmacy and thyroid sent in both meds to [facility]

## 2014-01-14 ENCOUNTER — Other Ambulatory Visit (HOSPITAL_BASED_OUTPATIENT_CLINIC_OR_DEPARTMENT_OTHER): Payer: BC Managed Care – PPO | Admitting: Lab

## 2014-01-14 ENCOUNTER — Ambulatory Visit (HOSPITAL_BASED_OUTPATIENT_CLINIC_OR_DEPARTMENT_OTHER): Payer: BC Managed Care – PPO | Admitting: Hematology & Oncology

## 2014-01-14 ENCOUNTER — Encounter: Payer: Self-pay | Admitting: Hematology & Oncology

## 2014-01-14 LAB — CBC WITH DIFFERENTIAL (CANCER CENTER ONLY)
BASO#: 0 10*3/uL (ref 0.0–0.2)
BASO%: 0.4 % (ref 0.0–2.0)
EOS ABS: 0.5 10*3/uL (ref 0.0–0.5)
EOS%: 6.7 % (ref 0.0–7.0)
HCT: 45.4 % (ref 34.8–46.6)
HGB: 16.4 g/dL — ABNORMAL HIGH (ref 11.6–15.9)
LYMPH#: 2.4 10*3/uL (ref 0.9–3.3)
LYMPH%: 31.2 % (ref 14.0–48.0)
MCH: 31.9 pg (ref 26.0–34.0)
MCHC: 36.1 g/dL — ABNORMAL HIGH (ref 32.0–36.0)
MCV: 88 fL (ref 81–101)
MONO#: 0.5 10*3/uL (ref 0.1–0.9)
MONO%: 5.8 % (ref 0.0–13.0)
NEUT#: 4.4 10*3/uL (ref 1.5–6.5)
NEUT%: 55.9 % (ref 39.6–80.0)
Platelets: 171 10*3/uL (ref 145–400)
RBC: 5.14 10*6/uL (ref 3.70–5.32)
RDW: 13 % (ref 11.1–15.7)
WBC: 7.8 10*3/uL (ref 3.9–10.0)

## 2014-01-14 LAB — COMPREHENSIVE METABOLIC PANEL
ALT: 18 U/L (ref 0–35)
AST: 24 U/L (ref 0–37)
Albumin: 4.8 g/dL (ref 3.5–5.2)
Alkaline Phosphatase: 90 U/L (ref 39–117)
BILIRUBIN TOTAL: 0.7 mg/dL (ref 0.2–1.2)
BUN: 11 mg/dL (ref 6–23)
CO2: 29 meq/L (ref 19–32)
Calcium: 10.1 mg/dL (ref 8.4–10.5)
Chloride: 97 mEq/L (ref 96–112)
Creatinine, Ser: 0.83 mg/dL (ref 0.50–1.10)
Glucose, Bld: 96 mg/dL (ref 70–99)
Potassium: 3.2 mEq/L — ABNORMAL LOW (ref 3.5–5.3)
Sodium: 140 mEq/L (ref 135–145)
Total Protein: 8 g/dL (ref 6.0–8.3)

## 2014-01-14 LAB — FERRITIN CHCC: FERRITIN: 94 ng/mL (ref 9–269)

## 2014-01-14 LAB — IRON AND TIBC CHCC
%SAT: 34 % (ref 21–57)
Iron: 124 ug/dL (ref 41–142)
TIBC: 366 ug/dL (ref 236–444)
UIBC: 242 ug/dL (ref 120–384)

## 2014-01-14 NOTE — Progress Notes (Signed)
Hematology and Oncology Follow Up Visit  SOPHRONIA VARNEY [id number] 19-Apr-1961 53 y.o. 01/14/2014   Principle Diagnosis:  1. 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 2. 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  Current Therapy:    Observation     Interim History:  Ms.  Keller is back for followup. We see her every 6 months. She's less sore, she's been doing well. She's had no problems.  We last saw her, her ferritin was 154 with an iron saturation of 34%.  She has been having her mammograms. She did not have any mostly cycles. She is 53 years old but looks a whole life number.  She's had no rashes. She's had no suspicious skin lesions. She does see her dermatologist.    Medications: Current outpatient prescriptions:ADVAIR DISKUS 100-50 MCG/DOSE AEPB, INHALE 1 PUFF INTO THE LUNGS DAILY AS NEEDED., Disp: 60 each, Rfl: 4;  albuterol (PROVENTIL HFA;VENTOLIN HFA) 108 (90 BASE) MCG/ACT inhaler, Inhale 2 puffs into the lungs every 6 (six) hours as needed., Disp: 18 g, Rfl: 4;  cetirizine (ZYRTEC) 10 MG tablet, Take 10 mg by mouth daily.  , Disp: , Rfl:  esomeprazole (NEXIUM) 40 MG capsule, Take 1 capsule (40 mg total) by mouth as needed., Disp: 30 capsule, Rfl: 4;  furosemide (LASIX) 40 MG tablet, Take 0.5 tablets (20 mg total) by mouth daily., Disp: 30 tablet, Rfl: 4;  levothyroxine (SYNTHROID, LEVOTHROID) 125 MCG tablet, Take 1 tablet (125 mcg total) by mouth daily before breakfast. TAKES 1/2 TAB TWO DAYS A WEEK., Disp: 30 tablet, Rfl: 1 losartan-hydrochlorothiazide (HYZAAR) 100-12.5 MG per tablet, Take 1 tablet by mouth daily. As directed, Disp: , Rfl: ;  montelukast (SINGULAIR) 10 MG tablet, Take 1 tablet (10 mg total) by mouth at bedtime., Disp: 30 tablet, Rfl: 2;  rosuvastatin (CRESTOR) 20 MG tablet, Take 1 tablet (20 mg total) by mouth daily., Disp: 30 tablet, Rfl: 5 hyoscyamine (HYOMAX-SL) 0.125 MG SL tablet, Place 1 tablet (0.125 mg total) under the tongue as  needed. Place 1-2 tablet under the tongue until it dissolves as needed, Disp: 30 tablet, Rfl: 2;  omega-3 acid ethyl esters (LOVAZA) 1 G capsule, Take 1 g by mouth daily. ON HOLD  01-14-14, Disp: , Rfl:   Allergies:  Allergies  Allergen Reactions  . Ace Inhibitors     Per AS:[id number]  . Actos [Pioglitazone Hydrochloride]     Per pt: unknown  . Aspirin Swelling    Swelling of mouth and throat  . Ciprofloxacin   . Crestor [Rosuvastatin Calcium]     Per pt: unknown  . Sulfonamide Derivatives     Per pt: unknown    Past Medical History, Surgical history, Social history, and Family History were reviewed and updated.  Review of Systems: As above  Physical Exam:  height is 5\' 3"  (1.6 m) and weight is 186 lb (84.369 kg). Her oral temperature is 98.1 F (36.7 C). Her blood pressure is 118/74 and her pulse is 86. Her respiration is 14.   Well-developed and well-nourished white female. Head and neck exam shows no ocular or oral lesions. She has no palpable cervical or supraclavicular lymph nodes. Lungs are clear. Cardiac exam regular rate and rhythm with no murmurs rubs or bruits. Abdomen soft. She has good bowel sounds. There is no fluid wave. There is no palpable liver or spleen tip. Back exam no tenderness over the spine ribs or hips. Extremities shows no clubbing cyanosis or edema. Skin exam shows no  suspicious skin lesions. Neurological exam is nonfocal.  Lab Results  Component Value Date   WBC 7.8 01/14/2014   HGB 16.4* 01/14/2014   HCT 45.4 01/14/2014   MCV 88 01/14/2014   PLT 171 01/14/2014     Chemistry      Component Value Date/Time   NA 145* 10/16/2013 0814   NA 141 01/16/2013 0835   K 4.5 10/16/2013 0814   CL 98 10/16/2013 0814   CO2 26 10/16/2013 0814   BUN 11 10/16/2013 0814   BUN 12 01/16/2013 0835   CREATININE 0.92 10/16/2013 0814   CREATININE 0.83 01/16/2013 0835      Component Value Date/Time   CALCIUM 10.6* 10/16/2013 0814   ALKPHOS 92 10/16/2013 0814   AST 26 10/16/2013  0814   ALT 17 10/16/2013 0814   BILITOT 0.7 10/16/2013 0814         Impression and Plan: Ms. Vanasten is 60-year-old white female with hemachromatosis. She has a double mutation. However, she really hadn't had no problems with her iron being high.  We will continue to follow her every 6 months.  We had to make sure that her skin continues to be followed. I don't see anything suspicious today.  We will get her back in 6 months.   [name], MD 6/17/201510:33 AM

## 2014-01-15 ENCOUNTER — Telehealth: Payer: Self-pay | Admitting: Hematology & Oncology

## 2014-01-15 NOTE — Telephone Encounter (Signed)
Pt aware of 6-26 phlebotomy

## 2014-01-22 ENCOUNTER — Telehealth: Payer: Self-pay | Admitting: Hematology & Oncology

## 2014-01-22 NOTE — Telephone Encounter (Signed)
PT MOVED 6-26 TO 6-29

## 2014-01-26 ENCOUNTER — Ambulatory Visit (HOSPITAL_BASED_OUTPATIENT_CLINIC_OR_DEPARTMENT_OTHER): Payer: BC Managed Care – PPO

## 2014-01-26 NOTE — Patient Instructions (Addendum)
Therapeutic Phlebotomy Care After Refer to this sheet in the next few weeks. These instructions provide you with information on caring for yourself after your procedure. Your caregiver may also give you more specific instructions. Your treatment has been planned according to current medical practices, but problems sometimes occur. Call your caregiver if you have any problems or questions after your procedure. HOME CARE INSTRUCTIONS Most people can go back to their normal activities right away. Before you leave, be sure to ask if there is anything you should or should not do. In general, it would be wise to:  Keep the bandage dry. You can remove the bandage after about 5 hours.  Eat well-balanced meals for the next 24 hours.  Drink enough fluids to keep your urine clear or pale yellow.  Avoid drinking alcohol minimally until after eating.  Avoid smoking for at least 30 minutes after the procedure.  Avoid strenous physical activity or heavy lifting or pulling for about 5 hours after the procedure.  Athletes should avoid strenous exercise for 12 hours or more.  Change positions slowly for the remainder of the day to prevent lightheadedness or fainting.  If you feel lightheaded, lie down until the feeling subsides.  If you have bleeding from the needle insertion site, elevate your arm and press firmly on the site until the bleeding stops.  If bruising or bleeding appears under the skin, apply ice to the area for 15 to 20 minutes, 3 to 4 times per day. Put the ice in a plastic bag and place a towel between the bag of ice and your skin. Do this while you are awake for the first 24 hours. The ice packs can be stopped before 24 hours if the swelling goes away. If swelling persists after 24 hours, a warm, moist washcloth can be applied to the area for 15 to 20 minutes, 3 to 4 times per day. The warm, moist treatments can be stopped when the swelling goes away.  It is important to continue further  therapeutic phlebotomy as directed by your caregiver. SEEK MEDICAL CARE IF:  There is bleeding or fluid leaking from the needle insertion site.  The needle insertion site becomes swollen, red, or sore.  You feel lightheaded, dizzy or nauseated, and the feeling does not go away.  You notice new bruising at the needle insertion site.  You feel more weak or tired than normal.  You develop a fever. SEEK IMMEDIATE MEDICAL CARE IF:   There is increased bleeding, pain, or swelling from the needle insertion site.  You have severe nausea or vomiting.  You have chest pain.  You have trouble breathing. MAKE SURE YOU:  Understand these instructions.  Will watch your condition.  Will get help right away if you are not doing well or get worse. Document Released: 12/19/2010 Document Revised: 10/09/2011 Document Reviewed: 12/19/2010 Exit[employer] Patient Information 2015 [employer]. This information is not intended to replace advice given to you by your health care provider. Make sure you discuss any questions you have with your health care provider.  

## 2014-02-25 ENCOUNTER — Other Ambulatory Visit (INDEPENDENT_AMBULATORY_CARE_PROVIDER_SITE_OTHER): Payer: BC Managed Care – PPO

## 2014-02-25 DIAGNOSIS — E785 Hyperlipidemia, unspecified: Secondary | ICD-10-CM

## 2014-02-25 DIAGNOSIS — E559 Vitamin D deficiency, unspecified: Secondary | ICD-10-CM

## 2014-02-25 DIAGNOSIS — I10 Essential (primary) hypertension: Secondary | ICD-10-CM

## 2014-02-26 ENCOUNTER — Encounter: Payer: Self-pay | Admitting: *Deleted

## 2014-02-26 LAB — BMP8+EGFR
BUN / CREAT RATIO: 16 (ref 9–23)
BUN: 12 mg/dL (ref 6–24)
CO2: 25 mmol/L (ref 18–29)
Calcium: 9.8 mg/dL (ref 8.7–10.2)
Chloride: 101 mmol/L (ref 97–108)
Creatinine, Ser: 0.73 mg/dL (ref 0.57–1.00)
GFR, EST AFRICAN AMERICAN: 107 mL/min/{1.73_m2} (ref >59)
GFR, EST NON AFRICAN AMERICAN: 93 mL/min/{1.73_m2} (ref >59)
GLUCOSE: 92 mg/dL (ref 65–99)
Potassium: 3.6 mmol/L (ref 3.5–5.2)
Sodium: 142 mmol/L (ref 134–144)

## 2014-02-26 LAB — HEPATIC FUNCTION PANEL
ALBUMIN: 4.2 g/dL (ref 3.5–5.5)
ALK PHOS: 76 IU/L (ref 39–117)
ALT: 20 IU/L (ref 0–32)
AST: 23 IU/L (ref 0–40)
BILIRUBIN DIRECT: 0.14 mg/dL (ref 0.00–0.40)
TOTAL PROTEIN: 6.7 g/dL (ref 6.0–8.5)
Total Bilirubin: 0.5 mg/dL (ref 0.0–1.2)

## 2014-02-26 LAB — VITAMIN D 25 HYDROXY (VIT D DEFICIENCY, FRACTURES): Vit D, 25-Hydroxy: 33.8 ng/mL (ref 30.0–100.0)

## 2014-02-26 LAB — NMR, LIPOPROFILE
Cholesterol: 131 mg/dL (ref 100–199)
HDL Cholesterol by NMR: 43 mg/dL (ref >39)
HDL PARTICLE NUMBER: 36.9 umol/L (ref >=30.5)
LDL Particle Number: 760 nmol/L (ref <1000)
LDL SIZE: 20.3 nm (ref >20.5)
LDLC SERPL CALC-MCNC: 58 mg/dL (ref 0–99)
LP-IR SCORE: 80 — AB (ref <=45)
Small LDL Particle Number: 401 nmol/L (ref <=527)
Triglycerides by NMR: 148 mg/dL (ref 0–149)

## 2014-03-04 ENCOUNTER — Encounter: Payer: Self-pay | Admitting: Family Medicine

## 2014-03-04 ENCOUNTER — Ambulatory Visit (INDEPENDENT_AMBULATORY_CARE_PROVIDER_SITE_OTHER): Payer: BC Managed Care – PPO | Admitting: Family Medicine

## 2014-03-04 VITALS — BP 118/71 | HR 85 | Temp 98.9°F | Ht 63.0 in | Wt 187.0 lb

## 2014-03-04 DIAGNOSIS — E785 Hyperlipidemia, unspecified: Secondary | ICD-10-CM

## 2014-03-04 DIAGNOSIS — J45909 Unspecified asthma, uncomplicated: Secondary | ICD-10-CM

## 2014-03-04 DIAGNOSIS — I10 Essential (primary) hypertension: Secondary | ICD-10-CM

## 2014-03-04 DIAGNOSIS — K219 Gastro-esophageal reflux disease without esophagitis: Secondary | ICD-10-CM

## 2014-03-04 MED ORDER — LOSARTAN POTASSIUM-HCTZ 100-12.5 MG PO TABS: 1.0000 | ORAL_TABLET | Freq: Every day | ORAL | Status: DC

## 2014-03-04 MED ORDER — MONTELUKAST SODIUM 10 MG PO TABS: 10.0000 mg | ORAL_TABLET | Freq: Every day | ORAL | Status: DC

## 2014-03-04 MED ORDER — FLUTICASONE-SALMETEROL 100-50 MCG/DOSE IN AEPB: INHALATION_SPRAY | RESPIRATORY_TRACT | Status: DC

## 2014-03-04 MED ORDER — ALBUTEROL SULFATE HFA 108 (90 BASE) MCG/ACT IN AERS: 2.0000 | INHALATION_SPRAY | Freq: Four times a day (QID) | RESPIRATORY_TRACT | Status: DC | PRN

## 2014-03-04 MED ORDER — ROSUVASTATIN CALCIUM 20 MG PO TABS: 20.0000 mg | ORAL_TABLET | Freq: Every day | ORAL | Status: DC

## 2014-03-04 MED ORDER — FUROSEMIDE 40 MG PO TABS: 20.0000 mg | ORAL_TABLET | Freq: Every day | ORAL | Status: DC

## 2014-03-04 MED ORDER — ESOMEPRAZOLE MAGNESIUM 40 MG PO CPDR: 40.0000 mg | DELAYED_RELEASE_CAPSULE | ORAL | Status: DC | PRN

## 2014-03-04 MED ORDER — LEVOTHYROXINE SODIUM 125 MCG PO TABS: 125.0000 ug | ORAL_TABLET | Freq: Every day | ORAL | Status: DC

## 2014-03-04 NOTE — Patient Instructions (Signed)
Continue current medications. Continue good therapeutic lifestyle changes which include good diet and exercise. Fall precautions discussed with patient. If an FOBT was given today- please return it to our front desk. If you are over 55 years old - you may need Prevnar 13 or the adult Pneumonia vaccine.                        

## 2014-03-04 NOTE — Progress Notes (Signed)
Subjective:    Patient ID: Sarah Garrett, female    DOB: 1960-11-30, 53 y.o.   MRN: [id number]  HPI Pt here for follow up and management of chronic medical problems. The patient is doing well. She hasn't FOBT at home that she needs to return. She will be scheduled for a DEXA scan but would prefer to wait for this. All medications will be refilled. Her most recent lab work will be reviewed today with her and it is the best it has been in a long time.  The patient is followed by the hematologist for her hemochromatosis and the dermatologist for her melanoma on a regular basis. She recently had a phlebotomy done at the hematology office.         Patient Active Problem List   Diagnosis Date Noted  . Contact dermatitis 03/24/2013  . Asthma 03/24/2013  . Melanoma of upper arm, right. T1a., History of 09/07/2011  . Hemochromatosis 07/07/2011  . 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 HYPERLIPIDEMIA 10/11/2007  . ANEMIA-NOS 10/11/2007  . HYPERTENSION 10/11/2007  . ALLERGIC RHINITIS 10/11/2007  . BRONCHITIS, CHRONIC 10/11/2007  . GERD 10/11/2007  . ADENOIDECTOMY, HX OF 10/11/2007   Outpatient Encounter Prescriptions as of 03/04/2014  Medication Sig  . ADVAIR DISKUS 100-50 MCG/DOSE AEPB INHALE 1 PUFF INTO THE LUNGS DAILY AS NEEDED.  Marland Kitchen albuterol (PROVENTIL HFA;VENTOLIN HFA) 108 (90 BASE) MCG/ACT inhaler Inhale 2 puffs into the lungs every 6 (six) hours as needed.  . cetirizine (ZYRTEC) 10 MG tablet Take 10 mg by mouth daily.    Marland Kitchen esomeprazole (NEXIUM) 40 MG capsule Take 1 capsule (40 mg total) by mouth as needed.  . furosemide (LASIX) 40 MG tablet Take 0.5 tablets (20 mg total) by mouth daily.  . hyoscyamine (HYOMAX-SL) 0.125 MG SL tablet Place 1 tablet (0.125 mg total) under the tongue as needed. Place 1-2 tablet under the tongue until it dissolves as needed  . levothyroxine (SYNTHROID, LEVOTHROID) 125 MCG tablet Take 1 tablet (125 mcg total) by mouth daily before breakfast.  TAKES 1/2 TAB TWO DAYS A WEEK.  Marland Kitchen losartan-hydrochlorothiazide (HYZAAR) 100-12.5 MG per tablet Take 1 tablet by mouth daily. As directed  . montelukast (SINGULAIR) 10 MG tablet Take 1 tablet (10 mg total) by mouth at bedtime.  . rosuvastatin (CRESTOR) 20 MG tablet Take 1 tablet (20 mg total) by mouth daily.  . [DISCONTINUED] omega-3 acid ethyl esters (LOVAZA) 1 G capsule Take 1 g by mouth daily. ON HOLD  01-14-14    Review of Systems  Constitutional: Negative.   HENT: Negative.   Eyes: Negative.   Respiratory: Negative.   Cardiovascular: Negative.   Gastrointestinal: Negative.   Endocrine: Negative.   Genitourinary: Negative.   Musculoskeletal: Negative.   Skin: Negative.   Allergic/Immunologic: Negative.   Neurological: Negative.   Hematological: Negative.   Psychiatric/Behavioral: Negative.        Objective:   Physical Exam  Nursing note and vitals reviewed. Constitutional: She is oriented to person, place, and time. She appears well-developed and well-nourished. No distress.  The patient is pleasant and cooperative and alert.  HENT:  Head: Normocephalic and atraumatic.  Right Ear: External ear normal.  Left Ear: External ear normal.  Nose: Nose normal.  Mouth/Throat: Oropharynx is clear and moist.  Eyes: Conjunctivae and EOM are normal. Pupils are equal, round, and reactive to light. Right eye exhibits no discharge. Left eye exhibits no discharge. No scleral icterus.  Neck: Normal range of motion. Neck supple. No JVD  present. No thyromegaly present.  Cardiovascular: Normal rate, regular rhythm, normal heart sounds and intact distal pulses.   No murmur heard. At 72 per minute  Pulmonary/Chest: Effort normal and breath sounds normal. No respiratory distress. She has no wheezes. She has no rales. She exhibits no tenderness.  There are no wheezes and the patient has a dry cough but not a persistent cough  Abdominal: Soft. Bowel sounds are normal. She exhibits no mass. There  is no tenderness. There is no rebound and no guarding.  Musculoskeletal: Normal range of motion. She exhibits no edema and no tenderness.  Lymphadenopathy:    She has no cervical adenopathy.  Neurological: She is alert and oriented to person, place, and time. She has normal reflexes. No cranial nerve deficit.  Skin: Skin is warm and dry. No rash noted.  Psychiatric: She has a normal mood and affect. Her behavior is normal. Judgment and thought content normal.   BP 118/71  Pulse 85  Temp(Src) 98.9 F (37.2 C) (Oral)  Ht 5\' 3"  (1.6 m)  Wt 187 lb (84.823 kg)  BMI 33.13 kg/m2  LMP 11/29/2011        Assessment & Plan:  1. Gastroesophageal reflux disease, esophagitis presence not specified  2. HYPERLIPIDEMIA -Continue current treatment  3. HYPERTENSION -Continue current treatment  4. Asthma, unspecified asthma severity, uncomplicated  5. Hemochromatosis - albuterol (PROVENTIL HFA;VENTOLIN HFA) 108 (90 BASE) MCG/ACT inhaler; Inhale 2 puffs into the lungs every 6 (six) hours as needed.  Dispense: 18 g; Refill: 4  Meds ordered this encounter  Medications  . esomeprazole (NEXIUM) 40 MG capsule    Sig: Take 1 capsule (40 mg total) by mouth as needed.    Dispense:  30 capsule    Refill:  4  . furosemide (LASIX) 40 MG tablet    Sig: Take 0.5 tablets (20 mg total) by mouth daily.    Dispense:  30 tablet    Refill:  3  . levothyroxine (SYNTHROID, LEVOTHROID) 125 MCG tablet    Sig: Take 1 tablet (125 mcg total) by mouth daily before breakfast. TAKES 1/2 TAB TWO DAYS A WEEK.    Dispense:  30 tablet    Refill:  3  . losartan-hydrochlorothiazide (HYZAAR) 100-12.5 MG per tablet    Sig: Take 1 tablet by mouth daily. As directed    Dispense:  30 tablet    Refill:  3  . rosuvastatin (CRESTOR) 20 MG tablet    Sig: Take 1 tablet (20 mg total) by mouth daily.    Dispense:  30 tablet    Refill:  3  . Fluticasone-Salmeterol (ADVAIR DISKUS) 100-50 MCG/DOSE AEPB    Sig: Inhale 1 puff BID      Dispense:  60 each    Refill:  3  . albuterol (PROVENTIL HFA;VENTOLIN HFA) 108 (90 BASE) MCG/ACT inhaler    Sig: Inhale 2 puffs into the lungs every 6 (six) hours as needed.    Dispense:  18 g    Refill:  4  . montelukast (SINGULAIR) 10 MG tablet    Sig: Take 1 tablet (10 mg total) by mouth at bedtime.    Dispense:  30 tablet    Refill:  3   Patient Instructions  Continue current medications. Continue good therapeutic lifestyle changes which include good diet and exercise. Fall precautions discussed with patient. If an FOBT was given today- please return it to our front desk. If you are over 64 years old - you may need Prevnar 40  or the adult Pneumonia vaccine.     Arrie Senate MD

## 2014-03-12 ENCOUNTER — Other Ambulatory Visit: Payer: Self-pay | Admitting: Family Medicine

## 2014-04-28 ENCOUNTER — Encounter: Payer: Self-pay | Admitting: Nurse Practitioner

## 2014-04-28 NOTE — Progress Notes (Signed)
Sarah Garrett presented on 01/26/2014 for phlebotomy per MD orders. Phlebotomy procedure started at 1306 and ended at 1313. 500 grams removed by Kandice Moos, RN with no complications.  Patient observed for 30 minutes after procedure without any incident. Patient tolerated procedure well. IV needle removed intact.

## 2014-04-29 ENCOUNTER — Encounter: Payer: Self-pay | Admitting: Gastroenterology

## 2014-06-15 ENCOUNTER — Other Ambulatory Visit: Payer: Self-pay | Admitting: Dermatology

## 2014-06-23 ENCOUNTER — Other Ambulatory Visit (INDEPENDENT_AMBULATORY_CARE_PROVIDER_SITE_OTHER): Payer: BC Managed Care – PPO

## 2014-06-23 DIAGNOSIS — M10471 Other secondary gout, right ankle and foot: Secondary | ICD-10-CM

## 2014-06-24 LAB — URIC ACID: URIC ACID: 6.6 mg/dL (ref 2.5–7.1)

## 2014-07-15 ENCOUNTER — Other Ambulatory Visit (HOSPITAL_BASED_OUTPATIENT_CLINIC_OR_DEPARTMENT_OTHER): Payer: BC Managed Care – PPO | Admitting: Lab

## 2014-07-15 ENCOUNTER — Ambulatory Visit (HOSPITAL_BASED_OUTPATIENT_CLINIC_OR_DEPARTMENT_OTHER): Payer: BC Managed Care – PPO

## 2014-07-15 ENCOUNTER — Encounter: Payer: Self-pay | Admitting: Hematology & Oncology

## 2014-07-15 ENCOUNTER — Ambulatory Visit (HOSPITAL_BASED_OUTPATIENT_CLINIC_OR_DEPARTMENT_OTHER): Payer: BC Managed Care – PPO | Admitting: Hematology & Oncology

## 2014-07-15 VITALS — BP 134/78 | HR 83 | Temp 97.9°F | Resp 14 | Ht 63.0 in | Wt 199.0 lb

## 2014-07-15 DIAGNOSIS — C4361 Malignant melanoma of right upper limb, including shoulder: Secondary | ICD-10-CM

## 2014-07-15 DIAGNOSIS — J45909 Unspecified asthma, uncomplicated: Secondary | ICD-10-CM

## 2014-07-15 DIAGNOSIS — E038 Other specified hypothyroidism: Secondary | ICD-10-CM

## 2014-07-15 DIAGNOSIS — Z23 Encounter for immunization: Secondary | ICD-10-CM

## 2014-07-15 LAB — CMP (CANCER CENTER ONLY)
ALBUMIN: 3.8 g/dL (ref 3.3–5.5)
ALT(SGPT): 30 U/L (ref 10–47)
AST: 30 U/L (ref 11–38)
Alkaline Phosphatase: 81 U/L (ref 26–84)
BILIRUBIN TOTAL: 0.7 mg/dL (ref 0.20–1.60)
BUN: 12 mg/dL (ref 7–22)
CO2: 30 mEq/L (ref 18–33)
Calcium: 9 mg/dL (ref 8.0–10.3)
Chloride: 101 mEq/L (ref 98–108)
Creat: 0.8 mg/dl (ref 0.6–1.2)
GLUCOSE: 96 mg/dL (ref 73–118)
POTASSIUM: 3.8 meq/L (ref 3.3–4.7)
Sodium: 144 mEq/L (ref 128–145)
Total Protein: 7.8 g/dL (ref 6.4–8.1)

## 2014-07-15 LAB — IRON AND TIBC CHCC
%SAT: 23 % (ref 21–57)
Iron: 83 ug/dL (ref 41–142)
TIBC: 363 ug/dL (ref 236–444)
UIBC: 279 ug/dL (ref 120–384)

## 2014-07-15 LAB — CBC WITH DIFFERENTIAL (CANCER CENTER ONLY)
BASO#: 0 10*3/uL (ref 0.0–0.2)
BASO%: 0.4 % (ref 0.0–2.0)
EOS ABS: 0.7 10*3/uL — AB (ref 0.0–0.5)
EOS%: 9.5 % — ABNORMAL HIGH (ref 0.0–7.0)
HEMATOCRIT: 44.3 % (ref 34.8–46.6)
HGB: 15.5 g/dL (ref 11.6–15.9)
LYMPH#: 2.3 10*3/uL (ref 0.9–3.3)
LYMPH%: 31.7 % (ref 14.0–48.0)
MCH: 30.9 pg (ref 26.0–34.0)
MCHC: 35 g/dL (ref 32.0–36.0)
MCV: 88 fL (ref 81–101)
MONO#: 0.3 10*3/uL (ref 0.1–0.9)
MONO%: 4.7 % (ref 0.0–13.0)
NEUT#: 3.9 10*3/uL (ref 1.5–6.5)
NEUT%: 53.7 % (ref 39.6–80.0)
Platelets: 142 10*3/uL — ABNORMAL LOW (ref 145–400)
RBC: 5.02 10*6/uL (ref 3.70–5.32)
RDW: 14.2 % (ref 11.1–15.7)
WBC: 7.2 10*3/uL (ref 3.9–10.0)

## 2014-07-15 LAB — FERRITIN CHCC: Ferritin: 56 ng/ml (ref 9–269)

## 2014-07-15 LAB — CHCC SATELLITE - SMEAR

## 2014-07-15 MED ORDER — INFLUENZA VAC SPLIT QUAD 0.5 ML IM SUSY
0.5000 mL | PREFILLED_SYRINGE | Freq: Once | INTRAMUSCULAR | Status: AC
  Administered 2014-07-15: 0.5 mL via INTRAMUSCULAR
  Filled 2014-07-15: qty 0.5

## 2014-07-15 NOTE — Progress Notes (Signed)
Hematology and Oncology Follow Up Visit  Sarah Garrett [id number] September 16, 1960 53 y.o. 07/15/2014   Principle Diagnosis:  1. 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 2. 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  Current Therapy:    Observation     Interim History:  [name] is back for followup. We see her every 6 months. Last saw her back in June. We had to phlebotomized. Her ferritin was 94.  She had a good summer. She's working without any problems.  She's gained weight. She's gained 12 ounces last saw her. She thinks this might be her thyroid. She sees her family doctor in January.  She has been having her mammograms. She did not have any monthly cycles. She is 53 years old but looks a whole lot younger  She's had no rashes. She's had no suspicious skin lesions. She does see her dermatologist.    Medications: Current outpatient prescriptions: albuterol (PROVENTIL HFA;VENTOLIN HFA) 108 (90 BASE) MCG/ACT inhaler, Inhale 2 puffs into the lungs every 6 (six) hours as needed., Disp: 18 g, Rfl: 4;  cetirizine (ZYRTEC) 10 MG tablet, Take 10 mg by mouth daily.  , Disp: , Rfl: ;  esomeprazole (NEXIUM) 40 MG capsule, Take 1 capsule (40 mg total) by mouth as needed., Disp: 30 capsule, Rfl: 4 Fluticasone-Salmeterol (ADVAIR DISKUS) 100-50 MCG/DOSE AEPB, Inhale 1 puff BID, Disp: 60 each, Rfl: 3;  furosemide (LASIX) 40 MG tablet, Take 0.5 tablets (20 mg total) by mouth daily., Disp: 30 tablet, Rfl: 3;  hyoscyamine (HYOMAX-SL) 0.125 MG SL tablet, Place 1 tablet (0.125 mg total) under the tongue as needed. Place 1-2 tablet under the tongue until it dissolves as needed, Disp: 30 tablet, Rfl: 2 levothyroxine (SYNTHROID, LEVOTHROID) 125 MCG tablet, Take 1 tablet (125 mcg total) by mouth daily before breakfast. TAKES 1/2 TAB TWO DAYS A WEEK., Disp: 30 tablet, Rfl: 3;  losartan-hydrochlorothiazide (HYZAAR) 100-12.5 MG per tablet, Take 1 tablet by mouth daily. As directed, Disp:  30 tablet, Rfl: 3;  meloxicam (MOBIC) 15 MG tablet, Take 15 mg by mouth every morning., Disp: , Rfl:  montelukast (SINGULAIR) 10 MG tablet, Take 1 tablet (10 mg total) by mouth at bedtime., Disp: 30 tablet, Rfl: 3;  rosuvastatin (CRESTOR) 20 MG tablet, Take 1 tablet (20 mg total) by mouth daily., Disp: 30 tablet, Rfl: 3 Current facility-administered medications: Influenza vac split quadrivalent PF (FLUARIX) injection 0.5 mL, 0.5 mL, Intramuscular, Once, Sarah Napoleon, MD  Allergies:  Allergies  Allergen Reactions  . Ace Inhibitors     Per MW:NUUVOZD  . Actos [Pioglitazone Hydrochloride]     Per pt: unknown  . Aspirin Swelling    Swelling of mouth and throat  . Ciprofloxacin   . Crestor [Rosuvastatin Calcium]     Per pt: unknown  . Pneumococcal Vaccines     Red rash  . Sulfonamide Derivatives     Per pt: unknown    Past Medical History, Surgical history, Social history, and Family History were reviewed and updated.  Review of Systems: As above  Physical Exam:  height is 5\' 3"  (1.6 m) and weight is 199 lb (90.266 kg). Her oral temperature is 97.9 F (36.6 C). Her blood pressure is 134/78 and her pulse is 83. Her respiration is 14.   Well-developed and well-nourished white female. Head and neck exam shows no ocular or oral lesions. She has no palpable cervical or supraclavicular lymph nodes. Lungs are clear. Cardiac exam regular rate and rhythm with  no murmurs rubs or bruits. Abdomen soft. She has good bowel sounds. There is no fluid wave. There is no palpable liver or spleen tip. Back exam no tenderness over the spine ribs or hips. Extremities shows no clubbing cyanosis or edema. Skin exam shows no  suspicious skin lesions. Neurological exam is nonfocal.  Lab Results  Component Value Date   WBC 7.2 07/15/2014   HGB 15.5 07/15/2014   HCT 44.3 07/15/2014   MCV 88 07/15/2014   PLT 142* 07/15/2014     Chemistry      Component Value Date/Time   NA 144 07/15/2014 0833   NA 142  02/25/2014 0806   NA 140 01/14/2014 0902   K 3.8 07/15/2014 0833   K 3.6 02/25/2014 0806   CL 101 07/15/2014 0833   CL 101 02/25/2014 0806   CO2 30 07/15/2014 0833   CO2 25 02/25/2014 0806   BUN 12 07/15/2014 0833   BUN 12 02/25/2014 0806   BUN 11 01/14/2014 0902   CREATININE 0.8 07/15/2014 0833   CREATININE 0.73 02/25/2014 0806      Component Value Date/Time   CALCIUM 9.0 07/15/2014 0833   CALCIUM 9.8 02/25/2014 0806   ALKPHOS 81 07/15/2014 0833   ALKPHOS 76 02/25/2014 0806   AST 30 07/15/2014 0833   AST 23 02/25/2014 0806   ALT 30 07/15/2014 0833   ALT 20 02/25/2014 0806   BILITOT 0.70 07/15/2014 0833   BILITOT 0.5 02/25/2014 0806         Impression and Plan: Sarah Garrett is 53 year old white female with hemachromatosis. She has a double mutation. However, she really hadn't had no problems with her iron being high. She was last phlebotomized back in June.  Her problem now is her weight. She thinks it might be her thyroid. She does see her family doctor for this.  We will continue to follow her every 6 months.  We had to make sure that her skin continues to be followed. I don't see anything suspicious today.  We will get her back in 6 months.   Sarah Napoleon, MD 12/16/20159:26 AM

## 2014-07-16 ENCOUNTER — Telehealth: Payer: Self-pay | Admitting: *Deleted

## 2014-07-16 NOTE — Telephone Encounter (Addendum)
Patient aware. Happy with result  ----- Message from [name], MD sent at 07/15/2014  3:31 PM EST ----- Call and let her know that the ferritin is actually only 56. She does not need to be phlebotomized. Laurey Arrow

## 2014-07-29 ENCOUNTER — Other Ambulatory Visit: Payer: Self-pay | Admitting: Family Medicine

## 2014-07-29 MED ORDER — LEVOTHYROXINE SODIUM 125 MCG PO TABS: 125.0000 ug | ORAL_TABLET | Freq: Every day | ORAL | Status: DC

## 2014-07-29 NOTE — Telephone Encounter (Signed)
DONE

## 2014-07-30 ENCOUNTER — Other Ambulatory Visit: Payer: Self-pay | Admitting: Family Medicine

## 2014-08-06 ENCOUNTER — Ambulatory Visit (INDEPENDENT_AMBULATORY_CARE_PROVIDER_SITE_OTHER): Payer: BLUE CROSS/BLUE SHIELD

## 2014-08-06 ENCOUNTER — Ambulatory Visit (INDEPENDENT_AMBULATORY_CARE_PROVIDER_SITE_OTHER): Payer: BLUE CROSS/BLUE SHIELD | Admitting: Family Medicine

## 2014-08-06 ENCOUNTER — Encounter: Payer: Self-pay | Admitting: Family Medicine

## 2014-08-06 VITALS — BP 127/81 | HR 87 | Temp 97.6°F | Ht 63.0 in | Wt 199.0 lb

## 2014-08-06 DIAGNOSIS — L03012 Cellulitis of left finger: Secondary | ICD-10-CM

## 2014-08-06 DIAGNOSIS — E785 Hyperlipidemia, unspecified: Secondary | ICD-10-CM

## 2014-08-06 MED ORDER — DOXYCYCLINE HYCLATE 100 MG PO TABS: 100.0000 mg | ORAL_TABLET | Freq: Two times a day (BID) | ORAL | Status: DC

## 2014-08-06 NOTE — Patient Instructions (Addendum)
Continue to soak finger in warm salty water 3 or 4 times daily Use peroxide after soaking Try using Bactroban ointment sparingly over the skin and the area of redness after soaking and peroxide. Take antibiotic as directed Return to clinic in one week, but if better 2 weeks for recheck If there is any drainage please come by and let us culture the drainage While taking antibiotic hold the Crestor

## 2014-08-06 NOTE — Progress Notes (Signed)
Subjective:    Patient ID: Sarah Garrett, female    DOB: May 02, 1961, 54 y.o.   MRN: [id number]  HPI Patient here today for infection of left mille finger. The patient does not recall any injury or trauma that would've started this. It is been going on for 2 weeks. She has not been able to come to the doctor because her husband broke his ankle. The patient has a history of hemachromatosis. She also has a history of hyperlipidemia and is taking Crestor for this. The infection on her finger has not had any drainage. She has been using warm salt water soaks and peroxide and Polysporin ointment. She uses her hands during her work every day         Patient Active Problem List   Diagnosis Date Noted  . Contact dermatitis 03/24/2013  . Asthma 03/24/2013  . Melanoma of upper arm, right. T1a., History of 09/07/2011  . Hemochromatosis 07/07/2011  . 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 HYPERLIPIDEMIA 10/11/2007  . ANEMIA-NOS 10/11/2007  . HYPERTENSION 10/11/2007  . ALLERGIC RHINITIS 10/11/2007  . BRONCHITIS, CHRONIC 10/11/2007  . GERD 10/11/2007  . ADENOIDECTOMY, HX OF 10/11/2007   Outpatient Encounter Prescriptions as of 08/06/2014  Medication Sig  . albuterol (PROVENTIL HFA;VENTOLIN HFA) 108 (90 BASE) MCG/ACT inhaler Inhale 2 puffs into the lungs every 6 (six) hours as needed.  . cetirizine (ZYRTEC) 10 MG tablet Take 10 mg by mouth daily.    Marland Kitchen esomeprazole (NEXIUM) 40 MG capsule Take 1 capsule (40 mg total) by mouth as needed.  . Fluticasone-Salmeterol (ADVAIR DISKUS) 100-50 MCG/DOSE AEPB Inhale 1 puff BID  . furosemide (LASIX) 40 MG tablet Take 0.5 tablets (20 mg total) by mouth daily.  . hyoscyamine (HYOMAX-SL) 0.125 MG SL tablet Place 1 tablet (0.125 mg total) under the tongue as needed. Place 1-2 tablet under the tongue until it dissolves as needed  . levothyroxine (SYNTHROID, LEVOTHROID) 125 MCG tablet Take 1 tablet (125 mcg total) by mouth daily before breakfast. TAKES  1/2 TAB TWO DAYS A WEEK.  Marland Kitchen losartan-hydrochlorothiazide (HYZAAR) 100-12.5 MG per tablet Take 1 tablet by mouth daily. As directed  . meloxicam (MOBIC) 15 MG tablet Take 15 mg by mouth every morning.  . montelukast (SINGULAIR) 10 MG tablet TAKE 1 TABLET (10 MG TOTAL) BY MOUTH AT BEDTIME.  . rosuvastatin (CRESTOR) 20 MG tablet Take 1 tablet (20 mg total) by mouth daily.    Review of Systems  Constitutional: Negative.   HENT: Negative.   Eyes: Negative.   Respiratory: Negative.   Cardiovascular: Negative.   Gastrointestinal: Negative.   Endocrine: Negative.   Genitourinary: Negative.   Musculoskeletal: Negative.   Skin: Negative.        Left middle finger infection - mostly around nail  Allergic/Immunologic: Negative.   Neurological: Negative.   Hematological: Negative.   Psychiatric/Behavioral: Negative.        Objective:   Physical Exam  Constitutional: She is oriented to person, place, and time. She appears well-developed and well-nourished. No distress.  HENT:  Head: Normocephalic.  Eyes: Conjunctivae and EOM are normal. Pupils are equal, round, and reactive to light. Right eye exhibits no discharge. Left eye exhibits no discharge. No scleral icterus.  Neck: Normal range of motion.  Musculoskeletal: Normal range of motion. She exhibits no edema.  Neurological: She is alert and oriented to person, place, and time.  Skin: Skin is warm and dry. Rash noted. There is erythema. No pallor.  The distal left third middle  finger of the left hand has erythema and swelling and tenderness without drainage or discharge. This involves the whole distal fingertip and slightly proximal to the nail. Upon applying pressure there was tenderness. The actual nail bed was negative it is more involved with the skin around the nail.  Psychiatric: She has a normal mood and affect. Her behavior is normal. Judgment and thought content normal.  Nursing note and vitals reviewed.  BP 127/81 mmHg  Pulse 87   Temp(Src) 97.6 F (36.4 C) (Oral)  Ht 5\' 3"  (1.6 m)  Wt 199 lb (90.266 kg)  BMI 35.26 kg/m2  LMP 11/29/2011  [facility] reading (PRIMARY) by  Dr. Festus Barren finger tip left hand- no active disease                                      Assessment & Plan:  1. Hemochromatosis  2. Cellulitis of finger of left hand - doxycycline (VIBRA-TABS) 100 MG tablet; Take 1 tablet (100 mg total) by mouth 2 (two) times daily.  Dispense: 28 tablet; Refill: 0 - DG Finger Middle Left; Future -Continue to soak finger in warm salty water 3-4 times daily for 20 minutes -Continue cleaning with peroxide after soaking -Use protection of fingertip during the day to not infected with any additional bacteria -Apply Bactroban ointment after soaking and peroxide sparingly -Return to clinic in 1 week for recheck  3. Hyperlipidemia -Hold Crestor while taking antibiotic  Patient Instructions  Continue to soak finger in warm salty water 3 or 4 times daily Use peroxide after soaking Try using Bactroban ointment sparingly over the skin and the area of redness after soaking and peroxide. Take antibiotic as directed Return to clinic in one week, but if better 2 weeks for recheck If there is any drainage please come by and let us culture the drainage While taking antibiotic hold the Crestor   Arrie Senate MD

## 2014-08-13 ENCOUNTER — Ambulatory Visit: Payer: BLUE CROSS/BLUE SHIELD | Admitting: Family Medicine

## 2014-08-20 ENCOUNTER — Ambulatory Visit (INDEPENDENT_AMBULATORY_CARE_PROVIDER_SITE_OTHER): Payer: BLUE CROSS/BLUE SHIELD | Admitting: Family Medicine

## 2014-08-20 ENCOUNTER — Encounter: Payer: Self-pay | Admitting: Family Medicine

## 2014-08-20 VITALS — BP 111/75 | HR 94 | Temp 98.9°F | Ht 63.0 in | Wt 198.0 lb

## 2014-08-20 DIAGNOSIS — L03012 Cellulitis of left finger: Secondary | ICD-10-CM

## 2014-08-20 MED ORDER — DOXYCYCLINE HYCLATE 100 MG PO TABS: 100.0000 mg | ORAL_TABLET | Freq: Every day | ORAL | Status: DC

## 2014-08-20 NOTE — Patient Instructions (Signed)
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

## 2014-08-20 NOTE — Progress Notes (Signed)
Subjective:    Patient ID: Sarah Garrett, female    DOB: April 01, 1961, 54 y.o.   MRN: [id number]  HPI Patient here today for 2 week follow up on left middle finger cellulitis. The finger is much improved and she has almost completed taking all of her antibiotic.         Patient Active Problem List   Diagnosis Date Noted  . Contact dermatitis 03/24/2013  . Asthma 03/24/2013  . Melanoma of upper arm, right. T1a., History of 09/07/2011  . Hemochromatosis 07/07/2011  . 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 HYPERLIPIDEMIA 10/11/2007  . ANEMIA-NOS 10/11/2007  . HYPERTENSION 10/11/2007  . ALLERGIC RHINITIS 10/11/2007  . BRONCHITIS, CHRONIC 10/11/2007  . GERD 10/11/2007  . ADENOIDECTOMY, HX OF 10/11/2007   Outpatient Encounter Prescriptions as of 08/20/2014  Medication Sig  . albuterol (PROVENTIL HFA;VENTOLIN HFA) 108 (90 BASE) MCG/ACT inhaler Inhale 2 puffs into the lungs every 6 (six) hours as needed.  . cetirizine (ZYRTEC) 10 MG tablet Take 10 mg by mouth daily.    Marland Kitchen doxycycline (VIBRA-TABS) 100 MG tablet Take 1 tablet (100 mg total) by mouth 2 (two) times daily.  Marland Kitchen esomeprazole (NEXIUM) 40 MG capsule Take 1 capsule (40 mg total) by mouth as needed.  . Fluticasone-Salmeterol (ADVAIR DISKUS) 100-50 MCG/DOSE AEPB Inhale 1 puff BID  . furosemide (LASIX) 40 MG tablet Take 0.5 tablets (20 mg total) by mouth daily.  . hyoscyamine (HYOMAX-SL) 0.125 MG SL tablet Place 1 tablet (0.125 mg total) under the tongue as needed. Place 1-2 tablet under the tongue until it dissolves as needed  . levothyroxine (SYNTHROID, LEVOTHROID) 125 MCG tablet Take 1 tablet (125 mcg total) by mouth daily before breakfast. TAKES 1/2 TAB TWO DAYS A WEEK.  Marland Kitchen losartan-hydrochlorothiazide (HYZAAR) 100-12.5 MG per tablet Take 1 tablet by mouth daily. As directed  . montelukast (SINGULAIR) 10 MG tablet TAKE 1 TABLET (10 MG TOTAL) BY MOUTH AT BEDTIME.  . rosuvastatin (CRESTOR) 20 MG tablet Take 1 tablet  (20 mg total) by mouth daily.  . [DISCONTINUED] meloxicam (MOBIC) 15 MG tablet Take 15 mg by mouth every morning.    Review of Systems  Constitutional: Negative.   HENT: Negative.   Eyes: Negative.   Respiratory: Negative.   Cardiovascular: Negative.   Gastrointestinal: Negative.   Endocrine: Negative.   Genitourinary: Negative.   Musculoskeletal: Negative.   Skin: Negative.        Recent cellulitis - left middle finger  Allergic/Immunologic: Negative.   Neurological: Negative.   Hematological: Negative.   Psychiatric/Behavioral: Negative.        Objective:   Physical Exam  Constitutional: She appears well-developed and well-nourished. No distress.  Musculoskeletal: Normal range of motion.  Neurological: She is alert.  Skin: Skin is warm and dry. No rash noted. No erythema.  The finger is much improved only slight redness around the nail bed. There is no drainage and infection is apparently resolving.  Psychiatric: She has a normal mood and affect. Her behavior is normal. Thought content normal.   BP 111/75 mmHg  Pulse 94  Temp(Src) 98.9 F (37.2 C) (Oral)  Ht 5\' 3"  (1.6 m)  Wt 198 lb (89.812 kg)  BMI 35.08 kg/m2  LMP 11/29/2011        Assessment & Plan:  1. Cellulitis of finger of left hand--much improved - doxycycline (VIBRA-TABS) 100 MG tablet; Take 1 tablet (100 mg total) by mouth daily.  Dispense: 14 tablet; Refill: 0  Patient Instructions  Sarah Garrett current  antibiotic twice a day and continue to take an antibiotic once a day for 2 more weeks You may resume taking your cholesterol medication Keep the finger protected for a couple of more weeks especially when handling paperwork and in the public.   Arrie Senate MD

## 2014-08-27 ENCOUNTER — Telehealth: Payer: Self-pay | Admitting: Family Medicine

## 2014-08-27 DIAGNOSIS — E559 Vitamin D deficiency, unspecified: Secondary | ICD-10-CM

## 2014-08-27 DIAGNOSIS — D649 Anemia, unspecified: Secondary | ICD-10-CM

## 2014-08-27 DIAGNOSIS — I10 Essential (primary) hypertension: Secondary | ICD-10-CM

## 2014-08-27 DIAGNOSIS — E785 Hyperlipidemia, unspecified: Secondary | ICD-10-CM

## 2014-08-28 NOTE — Telephone Encounter (Signed)
Orders are placed for labs to do prior to [facility] appt

## 2014-08-29 ENCOUNTER — Other Ambulatory Visit (INDEPENDENT_AMBULATORY_CARE_PROVIDER_SITE_OTHER): Payer: BLUE CROSS/BLUE SHIELD

## 2014-08-29 DIAGNOSIS — I10 Essential (primary) hypertension: Secondary | ICD-10-CM

## 2014-08-29 DIAGNOSIS — D649 Anemia, unspecified: Secondary | ICD-10-CM

## 2014-08-29 DIAGNOSIS — E785 Hyperlipidemia, unspecified: Secondary | ICD-10-CM

## 2014-08-29 DIAGNOSIS — E559 Vitamin D deficiency, unspecified: Secondary | ICD-10-CM

## 2014-08-29 LAB — POCT CBC
Granulocyte percent: 61.3 %G (ref 37–80)
HEMATOCRIT: 42.8 % (ref 37.7–47.9)
HEMOGLOBIN: 13.5 g/dL (ref 12.2–16.2)
LYMPH, POC: 2.8 (ref 0.6–3.4)
MCH: 28.5 pg (ref 27–31.2)
MCHC: 31.7 g/dL — AB (ref 31.8–35.4)
MCV: 90 fL (ref 80–97)
MPV: 7.4 fL (ref 0–99.8)
POC Granulocyte: 4.7 (ref 2–6.9)
POC LYMPH %: 36.7 % (ref 10–50)
Platelet Count, POC: 193 10*3/uL (ref 142–424)
RBC: 4.8 M/uL (ref 4.04–5.48)
RDW, POC: 14 %
WBC: 7.6 10*3/uL (ref 4.6–10.2)

## 2014-08-29 NOTE — Progress Notes (Signed)
Lab only 

## 2014-08-31 LAB — BMP8+EGFR
BUN/Creatinine Ratio: 23 (ref 9–23)
BUN: 19 mg/dL (ref 6–24)
CO2: 26 mmol/L (ref 18–29)
CREATININE: 0.84 mg/dL (ref 0.57–1.00)
Calcium: 9.3 mg/dL (ref 8.7–10.2)
Chloride: 104 mmol/L (ref 97–108)
GFR calc non Af Amer: 78 mL/min/{1.73_m2} (ref >59)
GFR, EST AFRICAN AMERICAN: 90 mL/min/{1.73_m2} (ref >59)
Glucose: 88 mg/dL (ref 65–99)
POTASSIUM: 3.7 mmol/L (ref 3.5–5.2)
SODIUM: 145 mmol/L — AB (ref 134–144)

## 2014-08-31 LAB — NMR, LIPOPROFILE
Cholesterol: 152 mg/dL (ref 100–199)
HDL Cholesterol by NMR: 45 mg/dL (ref >39)
HDL Particle Number: 35.3 umol/L (ref >=30.5)
LDL Particle Number: 1006 nmol/L — ABNORMAL HIGH (ref <1000)
LDL SIZE: 20.1 nm (ref >20.5)
LDL-C: 76 mg/dL (ref 0–99)
LP-IR Score: 90 — ABNORMAL HIGH (ref <=45)
Small LDL Particle Number: 591 nmol/L — ABNORMAL HIGH (ref <=527)
Triglycerides by NMR: 153 mg/dL — ABNORMAL HIGH (ref 0–149)

## 2014-08-31 LAB — VITAMIN D 25 HYDROXY (VIT D DEFICIENCY, FRACTURES): Vit D, 25-Hydroxy: 31.3 ng/mL (ref 30.0–100.0)

## 2014-08-31 LAB — HEPATIC FUNCTION PANEL
ALK PHOS: 81 IU/L (ref 39–117)
ALT: 23 IU/L (ref 0–32)
AST: 20 IU/L (ref 0–40)
Albumin: 4 g/dL (ref 3.5–5.5)
BILIRUBIN DIRECT: 0.15 mg/dL (ref 0.00–0.40)
BILIRUBIN TOTAL: 0.5 mg/dL (ref 0.0–1.2)
Total Protein: 6.5 g/dL (ref 6.0–8.5)

## 2014-09-03 ENCOUNTER — Encounter: Payer: Self-pay | Admitting: Family Medicine

## 2014-09-03 ENCOUNTER — Ambulatory Visit (INDEPENDENT_AMBULATORY_CARE_PROVIDER_SITE_OTHER): Payer: BLUE CROSS/BLUE SHIELD | Admitting: Family Medicine

## 2014-09-03 VITALS — BP 126/71 | HR 97 | Temp 97.3°F | Ht 63.0 in | Wt 201.0 lb

## 2014-09-03 DIAGNOSIS — M79672 Pain in left foot: Secondary | ICD-10-CM

## 2014-09-03 DIAGNOSIS — E785 Hyperlipidemia, unspecified: Secondary | ICD-10-CM

## 2014-09-03 DIAGNOSIS — E039 Hypothyroidism, unspecified: Secondary | ICD-10-CM

## 2014-09-03 DIAGNOSIS — J45909 Unspecified asthma, uncomplicated: Secondary | ICD-10-CM

## 2014-09-03 DIAGNOSIS — L03011 Cellulitis of right finger: Secondary | ICD-10-CM

## 2014-09-03 DIAGNOSIS — R710 Precipitous drop in hematocrit: Secondary | ICD-10-CM

## 2014-09-03 DIAGNOSIS — K219 Gastro-esophageal reflux disease without esophagitis: Secondary | ICD-10-CM

## 2014-09-03 DIAGNOSIS — D649 Anemia, unspecified: Secondary | ICD-10-CM

## 2014-09-03 DIAGNOSIS — I10 Essential (primary) hypertension: Secondary | ICD-10-CM

## 2014-09-03 MED ORDER — FLUTICASONE-SALMETEROL 100-50 MCG/DOSE IN AEPB: INHALATION_SPRAY | RESPIRATORY_TRACT | Status: DC

## 2014-09-03 MED ORDER — ALBUTEROL SULFATE HFA 108 (90 BASE) MCG/ACT IN AERS: 2.0000 | INHALATION_SPRAY | Freq: Four times a day (QID) | RESPIRATORY_TRACT | Status: DC | PRN

## 2014-09-03 MED ORDER — LOSARTAN POTASSIUM-HCTZ 100-12.5 MG PO TABS: 1.0000 | ORAL_TABLET | Freq: Every day | ORAL | Status: DC

## 2014-09-03 MED ORDER — ESOMEPRAZOLE MAGNESIUM 40 MG PO CPDR: 40.0000 mg | DELAYED_RELEASE_CAPSULE | ORAL | Status: DC | PRN

## 2014-09-03 MED ORDER — MONTELUKAST SODIUM 10 MG PO TABS: ORAL_TABLET | ORAL | Status: DC

## 2014-09-03 MED ORDER — LEVOTHYROXINE SODIUM 125 MCG PO TABS: 125.0000 ug | ORAL_TABLET | Freq: Every day | ORAL | Status: DC

## 2014-09-03 MED ORDER — HYOSCYAMINE SULFATE 0.125 MG SL SUBL: 0.1250 mg | SUBLINGUAL_TABLET | SUBLINGUAL | Status: DC | PRN

## 2014-09-03 MED ORDER — FUROSEMIDE 40 MG PO TABS: 20.0000 mg | ORAL_TABLET | Freq: Every day | ORAL | Status: DC

## 2014-09-03 NOTE — Patient Instructions (Addendum)
Continue current medications. Continue good therapeutic lifestyle changes which include good diet and exercise. Fall precautions discussed with patient. If an FOBT was given today- please return it to our front desk. If you are over 56 years old - you may need Prevnar 57 or the adult Pneumonia vaccine.  Flu Shots are still available at our office. If you still haven't had one please call to set up a nurse visit to get one.   After your visit with [place] today you will receive a survey in the mail or online from [employer] regarding your care with [place]. Please take a moment to fill this out. Your feedback is very important to [place] as you can help [place] better understand your patient needs as well as improve your experience and satisfaction. WE CARE ABOUT YOU!!!   BRING fobt BACK asap COME TO LAB FOR CBC IN 4 WEEKS

## 2014-09-03 NOTE — Progress Notes (Signed)
Subjective:    Patient ID: Sarah Garrett, female    DOB: 06-Feb-1961, 54 y.o.   MRN: [id number]  HPI  54 year old female comes in today to discuss chronic medical conditions which include asthma, hypertension, hyperlipidemia, GERD, and hypothyroidism. She continues to complain of redness in left middle finger. She is being treated for cellulitis and is taking doxycycline. She is taking medications regularly. The patient has had good blood pressure readings outside the office. She saw the hematologist in mid December and got a good report from him regarding her hemachromatosis. She is still taking antibiotic for the finger cellulitis which continues to improve. She has been seeing the podiatrist and he started her on some meloxicam 15 mg recently. She has not had any trouble with her stomach as far she can tell with this. The lab work today was reviewed and it was significant that her hemoglobin was down almost 2 points from what it was checked a month ago. The remainder of her lab work was reviewed and there were no other findings other than a slight rise in her cholesterol and she is indicating she will do better with her diet. Health maintenance wise she is due to get a Pap smear and a mammogram and she agrees that she will arrange this herself. Also the patient has a history of asthma and this is been stable recently.     Patient Active Problem List   Diagnosis Date Noted  . Contact dermatitis 03/24/2013  . Asthma 03/24/2013  . Melanoma of upper arm, right. T1a., History of 09/07/2011  . Hemochromatosis 07/07/2011  . Hypothyroidism 02/17/2008  . Hyperlipidemia 10/11/2007  . ANEMIA-NOS 10/11/2007  . Essential hypertension 10/11/2007  . ALLERGIC RHINITIS 10/11/2007  . BRONCHITIS, CHRONIC 10/11/2007  . GERD 10/11/2007  . ADENOIDECTOMY, HX OF 10/11/2007   Outpatient Encounter Prescriptions as of 09/03/2014  Medication Sig  . albuterol (PROVENTIL HFA;VENTOLIN HFA) 108 (90 BASE) MCG/ACT  inhaler Inhale 2 puffs into the lungs every 6 (six) hours as needed.  . cetirizine (ZYRTEC) 10 MG tablet Take 10 mg by mouth daily.    Marland Kitchen doxycycline (VIBRA-TABS) 100 MG tablet Take 1 tablet (100 mg total) by mouth daily.  Marland Kitchen esomeprazole (NEXIUM) 40 MG capsule Take 1 capsule (40 mg total) by mouth as needed.  . Fluticasone-Salmeterol (ADVAIR DISKUS) 100-50 MCG/DOSE AEPB Inhale 1 puff BID  . furosemide (LASIX) 40 MG tablet Take 0.5 tablets (20 mg total) by mouth daily.  . hyoscyamine (HYOMAX-SL) 0.125 MG SL tablet Place 1 tablet (0.125 mg total) under the tongue as needed. Place 1-2 tablet under the tongue until it dissolves as needed  . levothyroxine (SYNTHROID, LEVOTHROID) 125 MCG tablet Take 1 tablet (125 mcg total) by mouth daily before breakfast. TAKES 1/2 TAB TWO DAYS A WEEK.  Marland Kitchen losartan-hydrochlorothiazide (HYZAAR) 100-12.5 MG per tablet Take 1 tablet by mouth daily. As directed  . meloxicam (MOBIC) 15 MG tablet   . montelukast (SINGULAIR) 10 MG tablet TAKE 1 TABLET (10 MG TOTAL) BY MOUTH AT BEDTIME.  . rosuvastatin (CRESTOR) 20 MG tablet Take 1 tablet (20 mg total) by mouth daily.     Review of Systems  Constitutional: Negative.   HENT: Negative.   Eyes: Negative.   Respiratory: Negative.   Cardiovascular: Negative.   Gastrointestinal: Negative.   Endocrine: Negative.   Genitourinary: Negative.   Musculoskeletal: Negative.   Skin: Negative.        Continues to have redness in tip of finger. Taking Doxycycline.  Allergic/Immunologic: Negative.   Neurological: Negative.   Hematological: Negative.   Psychiatric/Behavioral: Negative.   All other systems reviewed and are negative.      Objective:   Physical Exam  Constitutional: She is oriented to person, place, and time. She appears well-developed and well-nourished. No distress.  HENT:  Head: Normocephalic and atraumatic.  Right Ear: External ear normal.  Left Ear: External ear normal.  Nose: Nose normal.  Mouth/Throat:  Oropharynx is clear and moist.  Eyes: Conjunctivae and EOM are normal. Pupils are equal, round, and reactive to light. Right eye exhibits no discharge. Left eye exhibits no discharge. No scleral icterus.  Neck: Normal range of motion. Neck supple. No thyromegaly present.  No carotid bruits or anterior cervical adenopathy  Cardiovascular: Normal rate, regular rhythm, normal heart sounds and intact distal pulses.  Exam reveals no friction rub.   No murmur heard. The heart has a regular rate and rhythm at 72/m  Pulmonary/Chest: Effort normal and breath sounds normal. No respiratory distress. She has no wheezes. She has no rales. She exhibits no tenderness.  Clear anteriorly and posteriorly  Abdominal: Soft. Bowel sounds are normal. She exhibits no mass. There is no tenderness. There is no rebound and no guarding.  No masses or abdominal tenderness  Musculoskeletal: Normal range of motion. She exhibits no edema or tenderness.  Minimal pain and left posterior heel  Lymphadenopathy:    She has no cervical adenopathy.  Neurological: She is alert and oriented to person, place, and time. No cranial nerve deficit.  Skin: Skin is warm and dry. No rash noted.  Psychiatric: She has a normal mood and affect. Her behavior is normal. Judgment and thought content normal.  Nursing note and vitals reviewed.   BP 126/71 mmHg  Pulse 97  Temp(Src) 97.3 F (36.3 C) (Oral)  Ht 5\' 3"  (1.6 m)  Wt 201 lb (91.173 kg)  BMI 35.61 kg/m2  LMP 11/29/2011       Assessment & Plan:  1. Essential hypertension -Continue current treatment  2. Hyperlipidemia -Although cholesterol numbers are slightly increased the patient will continue with her current Crestor treatment  3. Gastroesophageal reflux disease, esophagitis presence not specified -The patient has not had any symptoms with this but she is taking meloxicam and her hemoglobin has decreased since it was checked a month ago. -Continue Nexium  4. Asthma,  unspecified asthma severity, uncomplicated -Her breathing has been stable and she will continue with her current inhalers  5. Hypothyroidism, unspecified hypothyroidism type  6. Hemochromatosis -Continue follow-up with hematologist - albuterol (PROVENTIL HFA;VENTOLIN HFA) 108 (90 BASE) MCG/ACT inhaler; Inhale 2 puffs into the lungs every 6 (six) hours as needed.  Dispense: 18 g; Refill: 4  7. Decreased hemoglobin -Discontinue meloxicam and return FOBT and repeat CBC in 4 weeks  8. Heel pain, left -Take Tylenol for pain and discontinue meloxicam  9. Finger cellulitis -This continues to improve but the redness is not completely gone. The patient will continue taking the antibiotic and she has an appointment with the dermatologist soon and she will have him to look at the finger also.  Meds ordered this encounter  Medications  . meloxicam (MOBIC) 15 MG tablet    Sig:     Refill:  2  . levothyroxine (SYNTHROID, LEVOTHROID) 125 MCG tablet    Sig: Take 1 tablet (125 mcg total) by mouth daily before breakfast. TAKES 1/2 TAB TWO DAYS A WEEK.    Dispense:  30 tablet    Refill:  3  . losartan-hydrochlorothiazide (HYZAAR) 100-12.5 MG per tablet    Sig: Take 1 tablet by mouth daily. As directed    Dispense:  30 tablet    Refill:  3  . montelukast (SINGULAIR) 10 MG tablet    Sig: TAKE 1 TABLET (10 MG TOTAL) BY MOUTH AT BEDTIME.    Dispense:  30 tablet    Refill:  4  . hyoscyamine (HYOMAX-SL) 0.125 MG SL tablet    Sig: Place 1 tablet (0.125 mg total) under the tongue as needed. Place 1-2 tablet under the tongue until it dissolves as needed    Dispense:  30 tablet    Refill:  2  . furosemide (LASIX) 40 MG tablet    Sig: Take 0.5 tablets (20 mg total) by mouth daily.    Dispense:  30 tablet    Refill:  3  . Fluticasone-Salmeterol (ADVAIR DISKUS) 100-50 MCG/DOSE AEPB    Sig: Inhale 1 puff BID    Dispense:  60 each    Refill:  3  . esomeprazole (NEXIUM) 40 MG capsule    Sig: Take 1  capsule (40 mg total) by mouth as needed.    Dispense:  30 capsule    Refill:  4  . albuterol (PROVENTIL HFA;VENTOLIN HFA) 108 (90 BASE) MCG/ACT inhaler    Sig: Inhale 2 puffs into the lungs every 6 (six) hours as needed.    Dispense:  18 g    Refill:  4   Patient Instructions  Continue current medications. Continue good therapeutic lifestyle changes which include good diet and exercise. Fall precautions discussed with patient. If an FOBT was given today- please return it to our front desk. If you are over 61 years old - you may need Prevnar 71 or the adult Pneumonia vaccine.  Flu Shots are still available at our office. If you still haven't had one please call to set up a nurse visit to get one.   After your visit with [place] today you will receive a survey in the mail or online from [employer] regarding your care with [place]. Please take a moment to fill this out. Your feedback is very important to [place] as you can help [place] better understand your patient needs as well as improve your experience and satisfaction. WE CARE ABOUT YOU!!!   BRING fobt BACK asap COME TO LAB FOR CBC IN 4 WEEKS    Arrie Senate MD

## 2014-09-05 LAB — SPECIMEN STATUS REPORT

## 2014-09-05 LAB — THYROID PANEL WITH TSH
FREE THYROXINE INDEX: 2.6 (ref 1.2–4.9)
T3 UPTAKE RATIO: 29 % (ref 24–39)
T4 TOTAL: 8.8 ug/dL (ref 4.5–12.0)
TSH: 1.66 u[IU]/mL (ref 0.450–4.500)

## 2014-09-05 LAB — URIC ACID: URIC ACID: 7.6 mg/dL — AB (ref 2.5–7.1)

## 2014-11-16 ENCOUNTER — Ambulatory Visit (INDEPENDENT_AMBULATORY_CARE_PROVIDER_SITE_OTHER): Payer: BLUE CROSS/BLUE SHIELD | Admitting: Family Medicine

## 2014-11-16 ENCOUNTER — Ambulatory Visit (INDEPENDENT_AMBULATORY_CARE_PROVIDER_SITE_OTHER): Payer: BLUE CROSS/BLUE SHIELD

## 2014-11-16 ENCOUNTER — Encounter: Payer: Self-pay | Admitting: Family Medicine

## 2014-11-16 VITALS — BP 116/82 | HR 96 | Temp 98.5°F | Ht 63.0 in | Wt 200.0 lb

## 2014-11-16 DIAGNOSIS — R05 Cough: Secondary | ICD-10-CM

## 2014-11-16 DIAGNOSIS — J4541 Moderate persistent asthma with (acute) exacerbation: Secondary | ICD-10-CM

## 2014-11-16 DIAGNOSIS — R059 Cough, unspecified: Secondary | ICD-10-CM

## 2014-11-16 MED ORDER — HYDROCODONE-HOMATROPINE 5-1.5 MG/5ML PO SYRP: 5.0000 mL | ORAL_SOLUTION | Freq: Four times a day (QID) | ORAL | Status: DC | PRN

## 2014-11-16 MED ORDER — TRAMADOL HCL 50 MG PO TABS: 50.0000 mg | ORAL_TABLET | Freq: Four times a day (QID) | ORAL | Status: DC | PRN

## 2014-11-16 MED ORDER — AMOXICILLIN-POT CLAVULANATE ER 1000-62.5 MG PO TB12: 2.0000 | ORAL_TABLET | Freq: Two times a day (BID) | ORAL | Status: DC

## 2014-11-16 MED ORDER — BETAMETHASONE SOD PHOS & ACET 6 (3-3) MG/ML IJ SUSP
6.0000 mg | Freq: Once | INTRAMUSCULAR | Status: AC
  Administered 2014-11-16: 6 mg via INTRAMUSCULAR

## 2014-11-16 MED ORDER — BETAMETHASONE SOD PHOS & ACET 6 (3-3) MG/ML IJ SUSP: 6.0000 mg | Freq: Once | INTRAMUSCULAR | Status: DC

## 2014-11-16 NOTE — Progress Notes (Signed)
Subjective:  Patient ID: Sarah Garrett, female    DOB: 03-06-1961  Age: 54 y.o. MRN: [id number]  CC: Cough and Back Pain   HPI Sarah Garrett presents for cough with dyspnea. Used inhaler, zyrteca nd robitussin. Twisted her back in the shower this morning. However Sarah Garrett's had some lower abdominal pain in the past as well. Today the patient states that the pain is worse than usual. It does not radiate. It is a 4-5/10 in severity. Midline L5-S1 area. Sarah Garrett has apparently a pinched nerve in that area by past evaluations by her specialists.   Sarah Garrett has a history of asthma for which Sarah Garrett uses Advair daily. Additionally Sarah Garrett is using her inhaler and/or her nebulizer for albuterol several times daily. Onset was about a week ago. Symptoms seem to be getting worse. There's been no fever but dyspnea is moderate.  History Sarah Garrett has a past medical history of Hemochromatosis (07/07/2011); Hyperlipidemia; Hypertension; GERD (gastroesophageal reflux disease); Anemia; Thyroid disease; H/O adenoidectomy; Melanoma; and Diverticulosis.   Sarah Garrett has past surgical history that includes Tonsillectomy and adenoidectomy; Breast biopsy (2004); and Melanoma excision (09/28/11).   Her family history is negative for Colon cancer and Stomach cancer.Sarah Garrett reports that Sarah Garrett quit smoking about 36 years ago. Her smoking use included Cigarettes. Sarah Garrett started smoking about 40 years ago. Sarah Garrett has a .75 pack-year smoking history. Sarah Garrett has never used smokeless tobacco. Sarah Garrett reports that Sarah Garrett does not drink alcohol or use illicit drugs.  Current Outpatient Prescriptions on File Prior to Visit  Medication Sig Dispense Refill  . albuterol (PROVENTIL HFA;VENTOLIN HFA) 108 (90 BASE) MCG/ACT inhaler Inhale 2 puffs into the lungs every 6 (six) hours as needed. 18 g 4  . cetirizine (ZYRTEC) 10 MG tablet Take 10 mg by mouth daily.      Marland Kitchen doxycycline (VIBRA-TABS) 100 MG tablet Take 1 tablet (100 mg total) by mouth daily. 14 tablet 0  . esomeprazole  (NEXIUM) 40 MG capsule Take 1 capsule (40 mg total) by mouth as needed. 30 capsule 4  . Fluticasone-Salmeterol (ADVAIR DISKUS) 100-50 MCG/DOSE AEPB Inhale 1 puff BID 60 each 3  . furosemide (LASIX) 40 MG tablet Take 0.5 tablets (20 mg total) by mouth daily. 30 tablet 3  . hyoscyamine (HYOMAX-SL) 0.125 MG SL tablet Place 1 tablet (0.125 mg total) under the tongue as needed. Place 1-2 tablet under the tongue until it dissolves as needed 30 tablet 2  . levothyroxine (SYNTHROID, LEVOTHROID) 125 MCG tablet Take 1 tablet (125 mcg total) by mouth daily before breakfast. TAKES 1/2 TAB TWO DAYS A WEEK. 30 tablet 3  . losartan-hydrochlorothiazide (HYZAAR) 100-12.5 MG per tablet Take 1 tablet by mouth daily. As directed 30 tablet 3  . montelukast (SINGULAIR) 10 MG tablet TAKE 1 TABLET (10 MG TOTAL) BY MOUTH AT BEDTIME. 30 tablet 4  . rosuvastatin (CRESTOR) 20 MG tablet Take 1 tablet (20 mg total) by mouth daily. 30 tablet 3  . meloxicam (MOBIC) 15 MG tablet   2   No current facility-administered medications on file prior to visit.    ROS Review of Systems  Constitutional: Negative for fever, chills, activity change and appetite change.  HENT: Positive for congestion, postnasal drip, rhinorrhea and sinus pressure. Negative for ear discharge, ear pain, hearing loss, nosebleeds, sneezing and trouble swallowing.   Respiratory: Negative for chest tightness and shortness of breath.   Cardiovascular: Negative for chest pain and palpitations.  Skin: Negative for rash.    Objective:  BP 116/82 mmHg  Pulse 96  Temp(Src) 98.5 F (36.9 C)  Ht 5\' 3"  (1.6 m)  Wt 200 lb (90.719 kg)  BMI 35.44 kg/m2  SpO2 96%  LMP 11/29/2011  BP Readings from Last 3 Encounters:  11/16/14 116/82  09/03/14 126/71  08/20/14 111/75    Wt Readings from Last 3 Encounters:  11/16/14 200 lb (90.719 kg)  09/03/14 201 lb (91.173 kg)  08/20/14 198 lb (89.812 kg)     Physical Exam  Constitutional: Sarah Garrett is oriented to person,  place, and time. Sarah Garrett appears well-developed and well-nourished. No distress.  HENT:  Head: Normocephalic and atraumatic.  Right Ear: Tympanic membrane and external ear normal. No decreased hearing is noted.  Left Ear: Tympanic membrane and external ear normal. No decreased hearing is noted.  Nose: Mucosal edema present. Right sinus exhibits no frontal sinus tenderness. Left sinus exhibits no frontal sinus tenderness.  Mouth/Throat: Oropharynx is clear and moist. No oropharyngeal exudate or posterior oropharyngeal erythema.  Eyes: Conjunctivae and EOM are normal. Pupils are equal, round, and reactive to light.  Neck: Normal range of motion. Neck supple. No Brudzinski's sign noted. No thyromegaly present.  Cardiovascular: Normal rate, regular rhythm and normal heart sounds.   No murmur heard. Pulmonary/Chest: Effort normal and breath sounds normal. No respiratory distress. Sarah Garrett has no wheezes. Sarah Garrett has no rales.  Abdominal: Soft. Bowel sounds are normal. Sarah Garrett exhibits no distension. There is no tenderness.  Lymphadenopathy:       Head (right side): No preauricular adenopathy present.       Head (left side): No preauricular adenopathy present.    Sarah Garrett has no cervical adenopathy.       Right cervical: No superficial cervical adenopathy present.      Left cervical: No superficial cervical adenopathy present.  Neurological: Sarah Garrett is alert and oriented to person, place, and time. Sarah Garrett has normal reflexes.  Skin: Skin is warm and dry.  Psychiatric: Sarah Garrett has a normal mood and affect. Her behavior is normal. Judgment and thought content normal.  Nursing note and vitals reviewed.   No results found for: HGBA1C  Lab Results  Component Value Date   WBC 7.6 08/29/2014   HGB 13.5 08/29/2014   HCT 42.8 08/29/2014   PLT 142* 07/15/2014   GLUCOSE 88 08/29/2014   CHOL 152 08/29/2014   TRIG 153* 08/29/2014   HDL 45 08/29/2014   LDLCALC 58 02/25/2014   ALT 23 08/29/2014   AST 20 08/29/2014   NA 145*  08/29/2014   K 3.7 08/29/2014   CL 104 08/29/2014   CREATININE 0.84 08/29/2014   BUN 19 08/29/2014   CO2 26 08/29/2014   TSH 1.660 08/29/2014   MICROALBUR 0.50 10/24/2012    Mm Lt Breast Bx W Loc Dev 1st Lesion Image Bx Spec Stereo Guide  08/26/2013   ADDENDUM REPORT: 08/26/2013 12:33  ADDENDUM: Pathology revealed fibrocystic changes with usual ductal hyperplasia and coarse and microscopic calcifications in the left breast. This was found to be concordant by Dr. Ulyess Blossom. Pathology was relayed to the patient by telephone. The patient reported doing well after the biopsy with minimal tenderness at the site. Post biopsy instructions were reviewed and her questions were answered. Sarah Garrett was encouraged to call [facility] for any additional concerns. Sarah Garrett was asked to return to [facility] for annual mammography.  Pathology results were reported by Susa Raring RN, BSN on August 26, 2013.   Electronically Signed   By: Ellie Lunch.D.  On: 08/26/2013 12:33   08/26/2013   CLINICAL DATA:  Microcalcifications in the 9 o'clock position of the left breast, 5 cm from the left nipple.  EXAM: LEFT STEREOTACTIC CORE NEEDLE BIOPSY  COMPARISON:  Previous exams.  FINDINGS: The patient and I discussed the procedure of stereotactic-guided biopsy including benefits and alternatives. We discussed the high likelihood of a successful procedure. We discussed the risks of the procedure including infection, bleeding, tissue injury, clip migration, and inadequate sampling. Informed written consent was given. The usual time out protocol was performed immediately prior to the procedure.  Using sterile technique, 2% Lidocaine and 2% lidocaine with epinephrine as local anesthetic, and stereotactic guidance, a 9 gauge vacuum assisted device was used to perform core needle biopsy of calcifications in the 9 o'clock position of the left breast using a mediolateral approach. Specimen  radiograph was performed showing calcifications in several core specimens. Specimens with calcifications are identified for pathology.  At the conclusion of the procedure, a dumbbell-shaped tissue marker clip was deployed into the biopsy cavity. Follow-up 2-view mammogram confirmed clip placement and removal of some of the calcifications.  IMPRESSION: Stereotactic-guided biopsy of calcifications in the 9 o'clock position of the left breast. No apparent complications.  Electronically Signed: By: Ulyess Blossom M.D. On: 08/25/2013 09:06    Assessment & Plan:   Anira was seen today for cough and back pain.  Diagnoses and all orders for this visit:  Cough Orders: -     DG Chest 2 View; Future -     betamethasone acetate-betamethasone sodium phosphate (CELESTONE) injection 6 mg; Inject 1 mL (6 mg total) into the muscle once. -     betamethasone acetate-betamethasone sodium phosphate (CELESTONE) injection 6 mg; Inject 1 mL (6 mg total) into the muscle once.  Asthmatic bronchitis with exacerbation, moderate persistent  Other orders -     HYDROcodone-homatropine (HYCODAN) 5-1.5 MG/5ML syrup; Take 5 mLs by mouth every 6 (six) hours as needed for cough. -     traMADol (ULTRAM) 50 MG tablet; Take 1 tablet (50 mg total) by mouth 4 (four) times daily as needed for moderate pain. -     amoxicillin-clavulanate (AUGMENTIN XR) 1000-62.5 MG per tablet; Take 2 tablets by mouth 2 (two) times daily.  I am having Ms. Alpert start on HYDROcodone-homatropine, traMADol, and amoxicillin-clavulanate. I am also having her maintain her cetirizine, rosuvastatin, doxycycline, meloxicam, levothyroxine, losartan-hydrochlorothiazide, montelukast, hyoscyamine, furosemide, Fluticasone-Salmeterol, esomeprazole, and albuterol. We administered betamethasone acetate-betamethasone sodium phosphate. We will continue to administer betamethasone acetate-betamethasone sodium phosphate.  Meds ordered this encounter  Medications  .  betamethasone acetate-betamethasone sodium phosphate (CELESTONE) injection 6 mg    Sig:   . betamethasone acetate-betamethasone sodium phosphate (CELESTONE) injection 6 mg    Sig:   . HYDROcodone-homatropine (HYCODAN) 5-1.5 MG/5ML syrup    Sig: Take 5 mLs by mouth every 6 (six) hours as needed for cough.    Dispense:  120 mL    Refill:  0  . traMADol (ULTRAM) 50 MG tablet    Sig: Take 1 tablet (50 mg total) by mouth 4 (four) times daily as needed for moderate pain.    Dispense:  60 tablet    Refill:  02  . amoxicillin-clavulanate (AUGMENTIN XR) 1000-62.5 MG per tablet    Sig: Take 2 tablets by mouth 2 (two) times daily.    Dispense:  40 tablet    Refill:  0     Follow-up: Return if symptoms worsen or fail to improve.  Claretta Fraise,  M.D.  

## 2014-11-16 NOTE — Progress Notes (Addendum)
Subjective:    Patient ID: Sarah Garrett, female    DOB: [birth year], 54 y.o.   MRN: [id number]  HPI Chief Complaint  Patient presents with  . Cough    Productive cough x 1 week. Has taken Robitussin and Zyrtec and has used inhaler and nebulizer a few times over the past week.   . Back Pain    Patient twisted in the shower this morning and felt a pop. Now has increased low back pain.      Patient Active Problem List   Diagnosis Date Noted  . Contact dermatitis 03/24/2013  . Asthma 03/24/2013  . Melanoma of upper arm, right. T1a., History of 09/07/2011  . Hemochromatosis 07/07/2011  . Hypothyroidism 02/17/2008  . Hyperlipidemia 10/11/2007  . ANEMIA-NOS 10/11/2007  . Essential hypertension 10/11/2007  . ALLERGIC RHINITIS 10/11/2007  . BRONCHITIS, CHRONIC 10/11/2007  . GERD 10/11/2007  . ADENOIDECTOMY, HX OF 10/11/2007   Outpatient Encounter Prescriptions as of 11/16/2014  Medication Sig  . albuterol (PROVENTIL HFA;VENTOLIN HFA) 108 (90 BASE) MCG/ACT inhaler Inhale 2 puffs into the lungs every 6 (six) hours as needed.  . cetirizine (ZYRTEC) 10 MG tablet Take 10 mg by mouth daily.    Marland Kitchen doxycycline (VIBRA-TABS) 100 MG tablet Take 1 tablet (100 mg total) by mouth daily.  Marland Kitchen esomeprazole (NEXIUM) 40 MG capsule Take 1 capsule (40 mg total) by mouth as needed.  . Fluticasone-Salmeterol (ADVAIR DISKUS) 100-50 MCG/DOSE AEPB Inhale 1 puff BID  . furosemide (LASIX) 40 MG tablet Take 0.5 tablets (20 mg total) by mouth daily.  . hyoscyamine (HYOMAX-SL) 0.125 MG SL tablet Place 1 tablet (0.125 mg total) under the tongue as needed. Place 1-2 tablet under the tongue until it dissolves as needed  . levothyroxine (SYNTHROID, LEVOTHROID) 125 MCG tablet Take 1 tablet (125 mcg total) by mouth daily before breakfast. TAKES 1/2 TAB TWO DAYS A WEEK.  Marland Kitchen losartan-hydrochlorothiazide (HYZAAR) 100-12.5 MG per tablet Take 1 tablet by mouth daily. As directed  . montelukast (SINGULAIR) 10 MG tablet TAKE 1  TABLET (10 MG TOTAL) BY MOUTH AT BEDTIME.  . rosuvastatin (CRESTOR) 20 MG tablet Take 1 tablet (20 mg total) by mouth daily.  . meloxicam (MOBIC) 15 MG tablet       Review of Systems  Constitutional: Negative for fever, chills, diaphoresis, appetite change, fatigue and unexpected weight change.  HENT: Negative for congestion, ear pain, hearing loss, postnasal drip, rhinorrhea, sneezing, sore throat and trouble swallowing.   Eyes: Negative for pain.  Respiratory: Negative for cough, chest tightness and shortness of breath.   Cardiovascular: Negative for chest pain and palpitations.  Gastrointestinal: Negative for nausea, vomiting, abdominal pain, diarrhea and constipation.  Genitourinary: Negative for dysuria, frequency and menstrual problem.  Musculoskeletal: Negative for joint swelling and arthralgias.  Skin: Negative for rash.  Neurological: Negative for dizziness, weakness, numbness and headaches.  Psychiatric/Behavioral: Negative for dysphoric mood and agitation.       Objective:   Physical Exam  Constitutional: She is oriented to person, place, and time. She appears well-developed and well-nourished. No distress.  HENT:  Head: Normocephalic and atraumatic.  Right Ear: External ear normal.  Left Ear: External ear normal.  Nose: Nose normal.  Mouth/Throat: Oropharynx is clear and moist.  Eyes: Conjunctivae and EOM are normal. Pupils are equal, round, and reactive to light.  Neck: Normal range of motion. Neck supple. No thyromegaly present.  Cardiovascular: Normal rate, regular rhythm and normal heart sounds.   No murmur heard. Pulmonary/Chest: Effort  normal and breath sounds normal. No respiratory distress. She has no wheezes. She has no rales.  Abdominal: Soft. Bowel sounds are normal. She exhibits no distension. There is no tenderness.  Lymphadenopathy:    She has no cervical adenopathy.  Neurological: She is alert and oriented to person, place, and time. She has normal  reflexes.  Skin: Skin is warm and dry.  Psychiatric: She has a normal mood and affect. Her behavior is normal. Judgment and thought content normal.    BP 116/82 mmHg  Pulse 96  Temp(Src) 98.5 F (36.9 C)  Ht 5\' 3"  (1.6 m)  Wt 200 lb (90.719 kg)  BMI 35.44 kg/m2  SpO2 96%  LMP 11/29/2011       Assessment & Plan:   1. Cough   2. Asthmatic bronchitis with exacerbation, moderate persistent     Meds ordered this encounter  Medications  . betamethasone acetate-betamethasone sodium phosphate (CELESTONE) injection 6 mg    Sig:   . betamethasone acetate-betamethasone sodium phosphate (CELESTONE) injection 6 mg    Sig:   . HYDROcodone-homatropine (HYCODAN) 5-1.5 MG/5ML syrup    Sig: Take 5 mLs by mouth every 6 (six) hours as needed for cough.    Dispense:  120 mL    Refill:  0  . traMADol (ULTRAM) 50 MG tablet    Sig: Take 1 tablet (50 mg total) by mouth 4 (four) times daily as needed for moderate pain.    Dispense:  60 tablet    Refill:  02  . amoxicillin-clavulanate (AUGMENTIN XR) 1000-62.5 MG per tablet    Sig: Take 2 tablets by mouth 2 (two) times daily.    Dispense:  40 tablet    Refill:  0    Orders Placed This Encounter  Procedures  . DG Chest 2 View    Standing Status: Future     Number of Occurrences: 1     Standing Expiration Date: 01/16/2016    Order Specific Question:  Reason for Exam (SYMPTOM  OR DIAGNOSIS REQUIRED)    Answer:  productive cough    Order Specific Question:  Is the patient pregnant?    Answer:  No    Order Specific Question:  Preferred imaging location?    Answer:  Internal    Labs pending Health Maintenance reviewed Diet and exercise encouraged Continue all meds as discussed Follow up in as needed    Claretta Fraise, MD

## 2014-11-17 ENCOUNTER — Telehealth: Payer: Self-pay | Admitting: Family Medicine

## 2014-11-17 MED ORDER — CEFUROXIME AXETIL 500 MG PO TABS: 500.0000 mg | ORAL_TABLET | Freq: Two times a day (BID) | ORAL | Status: DC

## 2014-11-17 NOTE — Telephone Encounter (Signed)
Patient was seen yesterday and was started on augmentin and she threw up all night after taking this medication. She started the medication at 5:30pm and then started throwing up at [facility] until late in the night. Patient wants to know what you recommend.

## 2014-11-17 NOTE — Telephone Encounter (Signed)
Patient aware.

## 2014-11-17 NOTE — Telephone Encounter (Signed)
Discontinue the medication. I'll send in a substitute that hopefully will be better tolerated.

## 2014-11-18 ENCOUNTER — Telehealth: Payer: Self-pay | Admitting: Family Medicine

## 2014-11-19 MED ORDER — ONDANSETRON 8 MG PO TBDP: 8.0000 mg | ORAL_TABLET | Freq: Four times a day (QID) | ORAL | Status: DC | PRN

## 2014-11-19 NOTE — Telephone Encounter (Signed)
Ondansetron ordered. Please notify patient.

## 2014-11-19 NOTE — Telephone Encounter (Signed)
Please advise 

## 2014-11-20 NOTE — Telephone Encounter (Signed)
lmovm that Rx was sent to pharmacy

## 2015-01-13 ENCOUNTER — Encounter: Payer: Self-pay | Admitting: Hematology & Oncology

## 2015-01-13 ENCOUNTER — Other Ambulatory Visit (HOSPITAL_BASED_OUTPATIENT_CLINIC_OR_DEPARTMENT_OTHER): Payer: BLUE CROSS/BLUE SHIELD

## 2015-01-13 ENCOUNTER — Ambulatory Visit (HOSPITAL_BASED_OUTPATIENT_CLINIC_OR_DEPARTMENT_OTHER): Payer: BLUE CROSS/BLUE SHIELD | Admitting: Hematology & Oncology

## 2015-01-13 DIAGNOSIS — Z85820 Personal history of malignant melanoma of skin: Secondary | ICD-10-CM

## 2015-01-13 DIAGNOSIS — C4361 Malignant melanoma of right upper limb, including shoulder: Secondary | ICD-10-CM

## 2015-01-13 DIAGNOSIS — E038 Other specified hypothyroidism: Secondary | ICD-10-CM

## 2015-01-13 LAB — CBC WITH DIFFERENTIAL (CANCER CENTER ONLY)
BASO#: 0 10*3/uL (ref 0.0–0.2)
BASO%: 0.5 % (ref 0.0–2.0)
EOS ABS: 0.5 10*3/uL (ref 0.0–0.5)
EOS%: 6.1 % (ref 0.0–7.0)
HCT: 45.2 % (ref 34.8–46.6)
HEMOGLOBIN: 16.2 g/dL — AB (ref 11.6–15.9)
LYMPH#: 2.3 10*3/uL (ref 0.9–3.3)
LYMPH%: 27.5 % (ref 14.0–48.0)
MCH: 31.9 pg (ref 26.0–34.0)
MCHC: 35.8 g/dL (ref 32.0–36.0)
MCV: 89 fL (ref 81–101)
MONO#: 0.5 10*3/uL (ref 0.1–0.9)
MONO%: 5.8 % (ref 0.0–13.0)
NEUT#: 4.9 10*3/uL (ref 1.5–6.5)
NEUT%: 60.1 % (ref 39.6–80.0)
Platelets: 199 10*3/uL (ref 145–400)
RBC: 5.08 10*6/uL (ref 3.70–5.32)
RDW: 13.5 % (ref 11.1–15.7)
WBC: 8.2 10*3/uL (ref 3.9–10.0)

## 2015-01-13 LAB — TSH CHCC: TSH: 1.337 m(IU)/L (ref 0.308–3.960)

## 2015-01-13 LAB — COMPREHENSIVE METABOLIC PANEL
ALT: 18 U/L (ref 0–35)
AST: 19 U/L (ref 0–37)
Albumin: 4.7 g/dL (ref 3.5–5.2)
Alkaline Phosphatase: 96 U/L (ref 39–117)
BUN: 9 mg/dL (ref 6–23)
CALCIUM: 10.2 mg/dL (ref 8.4–10.5)
CHLORIDE: 100 meq/L (ref 96–112)
CO2: 29 meq/L (ref 19–32)
Creatinine, Ser: 0.8 mg/dL (ref 0.50–1.10)
Glucose, Bld: 101 mg/dL — ABNORMAL HIGH (ref 70–99)
POTASSIUM: 3.9 meq/L (ref 3.5–5.3)
SODIUM: 142 meq/L (ref 135–145)
TOTAL PROTEIN: 7.9 g/dL (ref 6.0–8.3)
Total Bilirubin: 0.6 mg/dL (ref 0.2–1.2)

## 2015-01-13 LAB — IRON AND TIBC CHCC
%SAT: 25 % (ref 21–57)
Iron: 88 ug/dL (ref 41–142)
TIBC: 349 ug/dL (ref 236–444)
UIBC: 260 ug/dL (ref 120–384)

## 2015-01-13 LAB — FERRITIN CHCC: Ferritin: 84 ng/ml (ref 9–269)

## 2015-01-13 NOTE — Progress Notes (Signed)
Hematology and Oncology Follow Up Visit  Sarah Garrett [id number] May 12, 1961 54 y.o. 01/13/2015   Principle Diagnosis:  1. 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 2. 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  Current Therapy:    Observation     Interim History:  [name] is back for followup. We see her every 6 months. She is doing okay. Unfortunately, her daughter who is in college, was found to have  IIH. This was from a medication she was given by her dermatologist. She needed to have a spinal tap and this got a lot of fluid off her. This is helped quite a bit. Overall, she will not need any additional spinal taps.  Sarah Garrett discovered back from [place]. She is up visiting a sister. She had a good time.  She is doing well. She's working. She's having no problems with her skin. She has had the melanoma of the right arm. She's not noted any additional skin lesions. She does see her dermatologist on a regular basis.  She's had no change in bowel or bladder habits. She's had no change in weight.     Medications:  Current outpatient prescriptions:  .  albuterol (PROVENTIL HFA;VENTOLIN HFA) 108 (90 BASE) MCG/ACT inhaler, Inhale 2 puffs into the lungs every 6 (six) hours as needed., Disp: 18 g, Rfl: 4 .  cefUROXime (CEFTIN) 500 MG tablet, Take 1 tablet (500 mg total) by mouth 2 (two) times daily with a meal., Disp: 20 tablet, Rfl: 0 .  cetirizine (ZYRTEC) 10 MG tablet, Take 10 mg by mouth daily.  , Disp: , Rfl:  .  esomeprazole (NEXIUM) 40 MG capsule, Take 1 capsule (40 mg total) by mouth as needed., Disp: 30 capsule, Rfl: 4 .  Fluticasone-Salmeterol (ADVAIR DISKUS) 100-50 MCG/DOSE AEPB, Inhale 1 puff BID, Disp: 60 each, Rfl: 3 .  furosemide (LASIX) 40 MG tablet, Take 0.5 tablets (20 mg total) by mouth daily., Disp: 30 tablet, Rfl: 3 .  hyoscyamine (HYOMAX-SL) 0.125 MG SL tablet, Place 1 tablet (0.125 mg total) under the tongue as needed. Place  1-2 tablet under the tongue until it dissolves as needed, Disp: 30 tablet, Rfl: 2 .  levothyroxine (SYNTHROID, LEVOTHROID) 125 MCG tablet, Take 1 tablet (125 mcg total) by mouth daily before breakfast. TAKES 1/2 TAB TWO DAYS A WEEK., Disp: 30 tablet, Rfl: 3 .  losartan-hydrochlorothiazide (HYZAAR) 100-12.5 MG per tablet, Take 1 tablet by mouth daily. As directed, Disp: 30 tablet, Rfl: 3 .  montelukast (SINGULAIR) 10 MG tablet, TAKE 1 TABLET (10 MG TOTAL) BY MOUTH AT BEDTIME., Disp: 30 tablet, Rfl: 4 .  ondansetron (ZOFRAN-ODT) 8 MG disintegrating tablet, Take 1 tablet (8 mg total) by mouth every 6 (six) hours as needed for nausea or vomiting., Disp: 20 tablet, Rfl: 1 .  rosuvastatin (CRESTOR) 20 MG tablet, Take 1 tablet (20 mg total) by mouth daily., Disp: 30 tablet, Rfl: 3  Current facility-administered medications:  .  betamethasone acetate-betamethasone sodium phosphate (CELESTONE) injection 6 mg, 6 mg, Intramuscular, Once, Claretta Fraise, MD  Allergies:  Allergies  Allergen Reactions  . Ace Inhibitors     Per [facility]:EN[facility]PO[facility]  . Actos [Pioglitazone Hydrochloride]     Per pt: unknown  . Aspirin Swelling    Swelling of mouth and throat  . Ciprofloxacin   . Crestor [Rosuvastatin Calcium]     Per pt: unknown  . Pneumococcal Vaccines     Red rash  . Sulfonamide Derivatives  Per pt: unknown    Past Medical History, Surgical history, Social history, and Family History were reviewed and updated.  Review of Systems: As above  Physical Exam:  height is 5\' 3"  (1.6 m) and weight is 194 lb (87.998 kg). Her oral temperature is 97.8 F (36.6 C). Her blood pressure is 133/84 and her pulse is 91. Her respiration is 14.   Well-developed and well-nourished white female. Head and neck exam shows no ocular or oral lesions. She has no palpable cervical or supraclavicular lymph nodes. Lungs are clear. Cardiac exam regular rate and rhythm with no murmurs rubs or bruits. Abdomen soft. She has good bowel  sounds. There is no fluid wave. There is no palpable liver or spleen tip. Back exam no tenderness over the spine ribs or hips. Extremities shows no clubbing cyanosis or edema. Skin exam shows no  suspicious skin lesions. Neurological exam is nonfocal.  Lab Results  Component Value Date   WBC 8.2 01/13/2015   HGB 16.2* 01/13/2015   HCT 45.2 01/13/2015   MCV 89 01/13/2015   PLT 199 01/13/2015     Chemistry      Component Value Date/Time   NA 145* 08/29/2014 0920   NA 144 07/15/2014 0833   NA 140 01/14/2014 0902   K 3.7 08/29/2014 0920   K 3.8 07/15/2014 0833   CL 104 08/29/2014 0920   CL 101 07/15/2014 0833   CO2 26 08/29/2014 0920   CO2 30 07/15/2014 0833   BUN 19 08/29/2014 0920   BUN 12 07/15/2014 0833   BUN 11 01/14/2014 0902   CREATININE 0.84 08/29/2014 0920   CREATININE 0.8 07/15/2014 0833      Component Value Date/Time   CALCIUM 9.3 08/29/2014 0920   CALCIUM 9.0 07/15/2014 0833   ALKPHOS 81 08/29/2014 0920   ALKPHOS 81 07/15/2014 0833   AST 20 08/29/2014 0920   AST 30 07/15/2014 0833   ALT 23 08/29/2014 0920   ALT 30 07/15/2014 0833   BILITOT 0.5 08/29/2014 0920   BILITOT 0.70 07/15/2014 0833         Impression and Plan: [name] is 54 year old white female with hemachromatosis. She has a double mutation. However, she really hadn't had no problems with her iron being high. She was last phlebotomized back in June.  I am just glad that her daughter is doing well. I know this really was a struggle for [employer]. She is going back and forth to [place].  We will get her back in 6 months. I would not expect that her iron studies to be a right now.   [name], MD 6/15/20169:29 AM

## 2015-02-05 ENCOUNTER — Other Ambulatory Visit (INDEPENDENT_AMBULATORY_CARE_PROVIDER_SITE_OTHER): Payer: BLUE CROSS/BLUE SHIELD

## 2015-02-05 DIAGNOSIS — I10 Essential (primary) hypertension: Secondary | ICD-10-CM

## 2015-02-05 DIAGNOSIS — E039 Hypothyroidism, unspecified: Secondary | ICD-10-CM

## 2015-02-05 DIAGNOSIS — D649 Anemia, unspecified: Secondary | ICD-10-CM | POA: Diagnosis not present

## 2015-02-05 DIAGNOSIS — E785 Hyperlipidemia, unspecified: Secondary | ICD-10-CM | POA: Diagnosis not present

## 2015-02-05 DIAGNOSIS — K219 Gastro-esophageal reflux disease without esophagitis: Secondary | ICD-10-CM

## 2015-02-05 DIAGNOSIS — E559 Vitamin D deficiency, unspecified: Secondary | ICD-10-CM

## 2015-02-05 LAB — POCT CBC
GRANULOCYTE PERCENT: 64.5 % (ref 37–80)
HCT, POC: 46.7 % (ref 37.7–47.9)
Hemoglobin: 15.3 g/dL (ref 12.2–16.2)
LYMPH, POC: 2.6 (ref 0.6–3.4)
MCH: 29.2 pg (ref 27–31.2)
MCHC: 32.8 g/dL (ref 31.8–35.4)
MCV: 89 fL (ref 80–97)
MPV: 8.3 fL (ref 0–99.8)
PLATELET COUNT, POC: 172 10*3/uL (ref 142–424)
POC GRANULOCYTE: 5 (ref 2–6.9)
POC LYMPH PERCENT: 33 %L (ref 10–50)
RBC: 5.24 M/uL (ref 4.04–5.48)
RDW, POC: 13.5 %
WBC: 7.8 10*3/uL (ref 4.6–10.2)

## 2015-02-05 NOTE — Progress Notes (Signed)
Lab only 

## 2015-02-06 LAB — NMR, LIPOPROFILE
Cholesterol: 136 mg/dL (ref 100–199)
HDL Cholesterol by NMR: 43 mg/dL (ref >39)
HDL Particle Number: 37.1 umol/L (ref >=30.5)
LDL PARTICLE NUMBER: 950 nmol/L (ref <1000)
LDL SIZE: 20.3 nm (ref >20.5)
LDL-C: 68 mg/dL (ref 0–99)
LP-IR Score: 86 — ABNORMAL HIGH (ref <=45)
Small LDL Particle Number: 534 nmol/L — ABNORMAL HIGH (ref <=527)
TRIGLYCERIDES BY NMR: 124 mg/dL (ref 0–149)

## 2015-02-06 LAB — BMP8+EGFR
BUN/Creatinine Ratio: 14 (ref 9–23)
BUN: 12 mg/dL (ref 6–24)
CHLORIDE: 97 mmol/L (ref 97–108)
CO2: 26 mmol/L (ref 18–29)
Calcium: 9.6 mg/dL (ref 8.7–10.2)
Creatinine, Ser: 0.85 mg/dL (ref 0.57–1.00)
GFR calc non Af Amer: 77 mL/min/{1.73_m2} (ref >59)
GFR, EST AFRICAN AMERICAN: 89 mL/min/{1.73_m2} (ref >59)
GLUCOSE: 91 mg/dL (ref 65–99)
POTASSIUM: 3.7 mmol/L (ref 3.5–5.2)
Sodium: 143 mmol/L (ref 134–144)

## 2015-02-06 LAB — THYROID PANEL WITH TSH
Free Thyroxine Index: 3.5 (ref 1.2–4.9)
T3 UPTAKE RATIO: 28 % (ref 24–39)
T4 TOTAL: 12.5 ug/dL — AB (ref 4.5–12.0)
TSH: 0.274 u[IU]/mL — ABNORMAL LOW (ref 0.450–4.500)

## 2015-02-06 LAB — HEPATIC FUNCTION PANEL
ALBUMIN: 4.7 g/dL (ref 3.5–5.5)
ALT: 22 IU/L (ref 0–32)
AST: 24 IU/L (ref 0–40)
Alkaline Phosphatase: 87 IU/L (ref 39–117)
BILIRUBIN TOTAL: 0.6 mg/dL (ref 0.0–1.2)
Bilirubin, Direct: 0.18 mg/dL (ref 0.00–0.40)
Total Protein: 7.2 g/dL (ref 6.0–8.5)

## 2015-02-06 LAB — VITAMIN D 25 HYDROXY (VIT D DEFICIENCY, FRACTURES): VIT D 25 HYDROXY: 33.9 ng/mL (ref 30.0–100.0)

## 2015-02-08 ENCOUNTER — Ambulatory Visit: Payer: BLUE CROSS/BLUE SHIELD | Admitting: Family Medicine

## 2015-02-09 MED ORDER — LEVOTHYROXINE SODIUM 112 MCG PO TABS: 112.0000 ug | ORAL_TABLET | Freq: Every day | ORAL | Status: DC

## 2015-02-09 NOTE — Addendum Note (Signed)
Addended by: Shelbie Ammons on: 02/09/2015 09:05 AM   Modules accepted: Orders

## 2015-02-12 ENCOUNTER — Encounter: Payer: Self-pay | Admitting: Family Medicine

## 2015-02-12 ENCOUNTER — Ambulatory Visit (INDEPENDENT_AMBULATORY_CARE_PROVIDER_SITE_OTHER): Payer: BLUE CROSS/BLUE SHIELD | Admitting: Family Medicine

## 2015-02-12 ENCOUNTER — Encounter (INDEPENDENT_AMBULATORY_CARE_PROVIDER_SITE_OTHER): Payer: Self-pay

## 2015-02-12 VITALS — BP 120/88 | HR 95 | Temp 97.2°F | Ht 63.0 in | Wt 198.4 lb

## 2015-02-12 DIAGNOSIS — I10 Essential (primary) hypertension: Secondary | ICD-10-CM

## 2015-02-12 DIAGNOSIS — E785 Hyperlipidemia, unspecified: Secondary | ICD-10-CM

## 2015-02-12 DIAGNOSIS — J4541 Moderate persistent asthma with (acute) exacerbation: Secondary | ICD-10-CM

## 2015-02-12 DIAGNOSIS — K219 Gastro-esophageal reflux disease without esophagitis: Secondary | ICD-10-CM

## 2015-02-12 DIAGNOSIS — E039 Hypothyroidism, unspecified: Secondary | ICD-10-CM | POA: Diagnosis not present

## 2015-02-12 DIAGNOSIS — E559 Vitamin D deficiency, unspecified: Secondary | ICD-10-CM | POA: Diagnosis not present

## 2015-02-12 MED ORDER — LEVOTHYROXINE SODIUM 125 MCG PO TABS: ORAL_TABLET | ORAL | Status: DC

## 2015-02-12 NOTE — Progress Notes (Signed)
Subjective:    Patient ID: Sarah Garrett, female    DOB: 10-26-1960, 54 y.o.   MRN: [id number]  HPI Patient is here today for a follow up of her chronic medical problems. These problems include clued hemachromatosis, hyperlipidemia, hypertension, and hypothyroidism. She also has a history of melanoma of the upper right arm. She has no other complaints or concerns.. She has recently had lab work done and this will be reviewed with her during the visit today. The blood sugar is good at 91 and the creatinine and electrolytes were good. All liver function tests were within normal limits. Her cholesterol numbers with advanced lipid testing were excellent and she should continue with the Crestor. The vitamin D level was at the low end of the normal range and she needs to make sure that she takes her vitamin D3 regularly. The TSH is decreased. We will need to adjust her thyroid medication because of this. CBC was normal and the hemoglobin was good .    Review of Systems  Constitutional: Negative.   HENT: Negative.   Eyes: Negative.   Respiratory: Negative.   Cardiovascular: Negative.   Gastrointestinal: Negative.   Endocrine: Negative.   Genitourinary: Negative.   Musculoskeletal: Negative.   Skin: Negative.   Allergic/Immunologic: Negative.   Neurological: Negative.   Hematological: Negative.   Psychiatric/Behavioral: Negative.           Patient Active Problem List   Diagnosis Date Noted  . Contact dermatitis 03/24/2013  . Asthma 03/24/2013  . Melanoma of upper arm, right. T1a., History of 09/07/2011  . Hemochromatosis 07/07/2011  . Hypothyroidism 02/17/2008  . Hyperlipidemia 10/11/2007  . ANEMIA-NOS 10/11/2007  . Essential hypertension 10/11/2007  . ALLERGIC RHINITIS 10/11/2007  . BRONCHITIS, CHRONIC 10/11/2007  . GERD 10/11/2007  . ADENOIDECTOMY, HX OF 10/11/2007   Outpatient Encounter Prescriptions as of 02/12/2015  Medication Sig  . albuterol (PROVENTIL HFA;VENTOLIN  HFA) 108 (90 BASE) MCG/ACT inhaler Inhale 2 puffs into the lungs every 6 (six) hours as needed.  . cetirizine (ZYRTEC) 10 MG tablet Take 10 mg by mouth daily.    Marland Kitchen esomeprazole (NEXIUM) 40 MG capsule Take 1 capsule (40 mg total) by mouth as needed.  . Fluticasone-Salmeterol (ADVAIR DISKUS) 100-50 MCG/DOSE AEPB Inhale 1 puff BID  . furosemide (LASIX) 40 MG tablet Take 0.5 tablets (20 mg total) by mouth daily.  . hyoscyamine (HYOMAX-SL) 0.125 MG SL tablet Place 1 tablet (0.125 mg total) under the tongue as needed. Place 1-2 tablet under the tongue until it dissolves as needed  . levothyroxine (SYNTHROID, LEVOTHROID) 112 MCG tablet Take 1 tablet (112 mcg total) by mouth daily before breakfast.  . losartan-hydrochlorothiazide (HYZAAR) 100-12.5 MG per tablet Take 1 tablet by mouth daily. As directed  . montelukast (SINGULAIR) 10 MG tablet TAKE 1 TABLET (10 MG TOTAL) BY MOUTH AT BEDTIME.  . rosuvastatin (CRESTOR) 20 MG tablet Take 1 tablet (20 mg total) by mouth daily.  . [DISCONTINUED] cefUROXime (CEFTIN) 500 MG tablet Take 1 tablet (500 mg total) by mouth 2 (two) times daily with a meal. (Patient not taking: Reported on 02/12/2015)  . [DISCONTINUED] ondansetron (ZOFRAN-ODT) 8 MG disintegrating tablet Take 1 tablet (8 mg total) by mouth every 6 (six) hours as needed for nausea or vomiting. (Patient not taking: Reported on 02/12/2015)  . [DISCONTINUED] betamethasone acetate-betamethasone sodium phosphate (CELESTONE) injection 6 mg    No facility-administered encounter medications on file as of 02/12/2015.      Objective:   Physical Exam  Constitutional: She is oriented to person, place, and time. She appears well-developed and well-nourished. No distress.  HENT:  Head: Normocephalic and atraumatic.  Right Ear: External ear normal.  Left Ear: External ear normal.  Nose: Nose normal.  Mouth/Throat: Oropharynx is clear and moist.  Eyes: Conjunctivae and EOM are normal. Pupils are equal, round, and  reactive to light. Right eye exhibits no discharge. Left eye exhibits no discharge. No scleral icterus.  Neck: Normal range of motion. Neck supple. No thyromegaly present.  Cardiovascular: Normal rate, regular rhythm, normal heart sounds and intact distal pulses.  Exam reveals no gallop and no friction rub.   No murmur heard. At 72/m  Pulmonary/Chest: Effort normal and breath sounds normal. No respiratory distress. She has no wheezes. She has no rales. She exhibits no tenderness.  The lungs are clear anteriorly and posteriorly without any wheezing or congestion  Abdominal: Soft. Bowel sounds are normal. She exhibits no mass. There is no tenderness. There is no rebound and no guarding.  There was slight lower abdominal tenderness  Musculoskeletal: Normal range of motion. She exhibits no edema or tenderness.  Lymphadenopathy:    She has no cervical adenopathy.  Neurological: She is alert and oriented to person, place, and time. She has normal reflexes. No cranial nerve deficit.  Skin: Skin is warm and dry. No rash noted.  Psychiatric: She has a normal mood and affect. Her behavior is normal. Judgment and thought content normal.  Nursing note and vitals reviewed.  BP 120/88 mmHg  Pulse 95  Temp(Src) 97.2 F (36.2 C) (Oral)  Ht 5\' 3"  (1.6 m)  Wt 198 lb 6.4 oz (89.994 kg)  BMI 35.15 kg/m2  LMP 11/29/2011        Assessment & Plan:  1. Asthmatic bronchitis with exacerbation, moderate persistent -This remains stable and has been this way for a good while we not seeing any of the exacerbations that we used to see in the past. She did have an episode of bronchitis about a month ago but has recovered from this.  2. Essential hypertension -The blood pressure is good today and she should continue with her current treatment regimen.  3. Hyperlipidemia -Cholesterol numbers were good and at goal and she should continue with current treatment  4. Gastroesophageal reflux disease, esophagitis  presence not specified -She has some concerns about the Nexium and may consider trying Zantac 150 twice daily and leaving off the Nexium.  5. Hypothyroidism, unspecified hypothyroidism type -Her recent TSH was low and she will reduce her thyroid medication further and take half of a 125 on Monday Wednesday and Friday and a hole and all the other days and we will recheck her thyroid profile in 6-8 weeks.  6. Hemochromatosis -She will continue to follow-up with the hematologist regarding this problem  7. Vitamin D deficiency -Is going to resume taking her vitamin D and take it more regularly  Meds ordered this encounter  Medications  . levothyroxine (SYNTHROID) 125 MCG tablet    Sig: Take a 1/2 tablet on MWF and 1 tablet all other days    Dispense:  30 tablet    Refill:  3   Patient Instructions  Continue current medications. Continue good therapeutic lifestyle changes which include good diet and exercise. Fall precautions discussed with patient. If an FOBT was given today- please return it to our front desk. If you are over 37 years old - you may need Prevnar 45 or the adult Pneumonia vaccine.  Flu Shots will  be available at our office starting mid- September. Please call and schedule a FLU CLINIC APPOINTMENT.   Reduce the levothyroxin 125-1/2 on Monday Wednesday and Friday and 1 whole pill all other days and recheck thyroid profile in 6 weeks to 2 months Continue current treatment as noted above Continue aggressive therapeutic lifestyle changes as noted above Follow-up with hematologist as planned Return the FOBT If you consider stopping the Nexium you can try Zantac 150 twice daily before breakfast and supper Please check with your insurance regarding the Prevnar vaccine Also check on the shingles shot at the same time Make sure that you stay up-to-date and current on your pelvic exams and mammograms   Arrie Senate MD

## 2015-02-12 NOTE — Patient Instructions (Addendum)
Continue current medications. Continue good therapeutic lifestyle changes which include good diet and exercise. Fall precautions discussed with patient. If an FOBT was given today- please return it to our front desk. If you are over 56 years old - you may need Prevnar 41 or the adult Pneumonia vaccine.  Flu Shots will be available at our office starting mid- September. Please call and schedule a FLU CLINIC APPOINTMENT.   Reduce the levothyroxin 125-1/2 on Monday Wednesday and Friday and 1 whole pill all other days and recheck thyroid profile in 6 weeks to 2 months Continue current treatment as noted above Continue aggressive therapeutic lifestyle changes as noted above Follow-up with hematologist as planned Return the FOBT If you consider stopping the Nexium you can try Zantac 150 twice daily before breakfast and supper Please check with your insurance regarding the Prevnar vaccine Also check on the shingles shot at the same time Make sure that you stay up-to-date and current on your pelvic exams and mammograms

## 2015-02-17 ENCOUNTER — Other Ambulatory Visit: Payer: Self-pay | Admitting: Obstetrics and Gynecology

## 2015-02-17 DIAGNOSIS — N6002 Solitary cyst of left breast: Secondary | ICD-10-CM

## 2015-02-19 ENCOUNTER — Ambulatory Visit
Admission: RE | Admit: 2015-02-19 | Discharge: 2015-02-19 | Disposition: A | Discharge status: Home / Self Care | Payer: BLUE CROSS/BLUE SHIELD | Source: Ambulatory Visit | Attending: Obstetrics and Gynecology | Admitting: Obstetrics and Gynecology

## 2015-02-19 ENCOUNTER — Telehealth: Payer: Self-pay | Admitting: *Deleted

## 2015-02-19 DIAGNOSIS — N6002 Solitary cyst of left breast: Secondary | ICD-10-CM

## 2015-02-19 NOTE — Telephone Encounter (Addendum)
Patient aware of results and messages relayed.  ----- Message from [name], MD sent at 02/18/2015  5:57 PM EDT ----- Call - the iron level is 84.  She does not need a phlebotomy!!  I totally apologioze for taking so long to get back to her!!!  Tell her that i am praying for her dgtr!!!!  pete

## 2015-03-04 ENCOUNTER — Other Ambulatory Visit: Payer: Self-pay | Admitting: Obstetrics and Gynecology

## 2015-03-05 LAB — CYTOLOGY - PAP

## 2015-03-10 ENCOUNTER — Telehealth: Payer: Self-pay | Admitting: Family Medicine

## 2015-03-10 MED ORDER — AMOXICILLIN-POT CLAVULANATE 875-125 MG PO TABS: 1.0000 | ORAL_TABLET | Freq: Two times a day (BID) | ORAL | Status: DC

## 2015-03-10 NOTE — Telephone Encounter (Signed)
Patient is allergic to cipro

## 2015-03-10 NOTE — Telephone Encounter (Signed)
If the patient is not allergic to any of these components that she can take metronidazole 500 mg 3 times a day for 10 days and Cipro 500 twice a day for 10 days but she should come in and be rechecked when this is completed and if she gets worse she should call us sooner and she may need to be put in the hospital

## 2015-03-10 NOTE — Telephone Encounter (Signed)
Patient is also allergic to sulfa also

## 2015-03-10 NOTE — Telephone Encounter (Signed)
Patient states that her diverticulitis has flared up and has been very painful. She really doesn't want to have to come in and wants to know if we can call her something in

## 2015-03-10 NOTE — Telephone Encounter (Signed)
If the patient cannot take Cipro Septra DS can be substituted for this 1 twice daily for 10 days with food

## 2015-03-10 NOTE — Telephone Encounter (Signed)
Augmentin 875 twice daily for 10 days.

## 2015-03-10 NOTE — Telephone Encounter (Signed)
Per dr. Laurance Flatten Please have patient use augmentin 100 b1d x 10days.

## 2015-03-16 ENCOUNTER — Ambulatory Visit: Payer: BLUE CROSS/BLUE SHIELD | Admitting: Family Medicine

## 2015-03-20 ENCOUNTER — Other Ambulatory Visit: Payer: Self-pay | Admitting: Family Medicine

## 2015-04-05 ENCOUNTER — Other Ambulatory Visit: Payer: Self-pay | Admitting: Family Medicine

## 2015-05-29 ENCOUNTER — Other Ambulatory Visit: Payer: Self-pay | Admitting: Family Medicine

## 2015-06-15 ENCOUNTER — Ambulatory Visit: Payer: BLUE CROSS/BLUE SHIELD | Admitting: Family Medicine

## 2015-06-17 ENCOUNTER — Telehealth: Payer: Self-pay | Admitting: Family Medicine

## 2015-06-17 MED ORDER — FLUTICASONE-SALMETEROL 100-50 MCG/DOSE IN AEPB: INHALATION_SPRAY | RESPIRATORY_TRACT | Status: DC

## 2015-06-17 NOTE — Telephone Encounter (Signed)
Stp and she thought she needed to schedule an appt for labs, advised she could just come in whenever we are open and pt requested a refill on her advair. Advair sent to pharmacy.

## 2015-06-21 ENCOUNTER — Other Ambulatory Visit: Payer: BLUE CROSS/BLUE SHIELD

## 2015-06-28 ENCOUNTER — Ambulatory Visit (INDEPENDENT_AMBULATORY_CARE_PROVIDER_SITE_OTHER): Payer: BLUE CROSS/BLUE SHIELD | Admitting: Family Medicine

## 2015-06-28 ENCOUNTER — Encounter: Payer: Self-pay | Admitting: Family Medicine

## 2015-06-28 VITALS — BP 120/78 | HR 83 | Temp 97.4°F | Ht 63.0 in | Wt 202.0 lb

## 2015-06-28 DIAGNOSIS — I10 Essential (primary) hypertension: Secondary | ICD-10-CM

## 2015-06-28 DIAGNOSIS — C4361 Malignant melanoma of right upper limb, including shoulder: Secondary | ICD-10-CM

## 2015-06-28 DIAGNOSIS — J4541 Moderate persistent asthma with (acute) exacerbation: Secondary | ICD-10-CM | POA: Diagnosis not present

## 2015-06-28 DIAGNOSIS — E559 Vitamin D deficiency, unspecified: Secondary | ICD-10-CM | POA: Diagnosis not present

## 2015-06-28 DIAGNOSIS — E785 Hyperlipidemia, unspecified: Secondary | ICD-10-CM

## 2015-06-28 DIAGNOSIS — K219 Gastro-esophageal reflux disease without esophagitis: Secondary | ICD-10-CM | POA: Diagnosis not present

## 2015-06-28 DIAGNOSIS — E039 Hypothyroidism, unspecified: Secondary | ICD-10-CM | POA: Diagnosis not present

## 2015-06-28 DIAGNOSIS — J4520 Mild intermittent asthma, uncomplicated: Secondary | ICD-10-CM | POA: Diagnosis not present

## 2015-06-28 NOTE — Patient Instructions (Addendum)
Continue current medications. Continue good therapeutic lifestyle changes which include good diet and exercise. Fall precautions discussed with patient. If an FOBT was given today- please return it to our front desk. If you are over 56 years old - you may need Prevnar 56 or the adult Pneumonia vaccine.  **Flu shots are available--- please call and schedule a FLU-CLINIC appointment**  After your visit with [place] today you will receive a survey in the mail or online from [employer] regarding your care with [place]. Please take a moment to fill this out. Your feedback is very important to [place] as you can help [place] better understand your patient needs as well as improve your experience and satisfaction. WE CARE ABOUT YOU!!!   Get flu shot from Hematology  Use the stronger Advair for 2 weeks Return for labs fasting. Stay well hydrated Use Flonase regularly Use cool mist humidifier in

## 2015-06-28 NOTE — Progress Notes (Signed)
Subjective:    Patient ID: Sarah Garrett, female    DOB: Aug 04, 1960, 54 y.o.   MRN: [id number]  HPI Pt here for follow up and management of chronic medical problems which includes hypertension, hyperlipidemia, and hypothyroid. She is taking medications regularly. This patient has a significant history of asthma and allergy. She is having some problems now with her seasonal allergies. She also has hemachromatosis and is followed by the hematologist. She also has a remote history of melanoma in the right shoulder. She still sees a dermatologist every 6 months for this. She denies any chest pain. She is not having any problems with her swallowing other than some occasional heartburn and she takes Nexium for this. She denies any blood in the stool or black tarry bowel movements and is passing her water without problems. She did open Christmas decorations this weekend and as a result of that seems to have more trouble with her allergies including nasal congestion and slight tightness with her breathing.     Patient Active Problem List   Diagnosis Date Noted  . Contact dermatitis 03/24/2013  . Asthma 03/24/2013  . Melanoma of upper arm, right. T1a., History of 09/07/2011  . Hemochromatosis 07/07/2011  . Hypothyroidism 02/17/2008  . Hyperlipidemia 10/11/2007  . ANEMIA-NOS 10/11/2007  . Essential hypertension 10/11/2007  . ALLERGIC RHINITIS 10/11/2007  . BRONCHITIS, CHRONIC 10/11/2007  . GERD 10/11/2007  . ADENOIDECTOMY, HX OF 10/11/2007   Outpatient Encounter Prescriptions as of 06/28/2015  Medication Sig  . albuterol (PROVENTIL HFA;VENTOLIN HFA) 108 (90 BASE) MCG/ACT inhaler Inhale 2 puffs into the lungs every 6 (six) hours as needed.  . cetirizine (ZYRTEC) 10 MG tablet Take 10 mg by mouth daily.    . CRESTOR 20 MG tablet TAKE 1 TABLET (20 MG TOTAL) BY MOUTH DAILY.  Marland Kitchen esomeprazole (NEXIUM) 40 MG capsule Take 1 capsule (40 mg total) by mouth as needed.  . Fluticasone-Salmeterol (ADVAIR  DISKUS) 100-50 MCG/DOSE AEPB Inhale 1 puff BID  . furosemide (LASIX) 40 MG tablet Take 0.5 tablets (20 mg total) by mouth daily.  . hyoscyamine (HYOMAX-SL) 0.125 MG SL tablet Place 1 tablet (0.125 mg total) under the tongue as needed. Place 1-2 tablet under the tongue until it dissolves as needed  . levothyroxine (SYNTHROID) 125 MCG tablet Take a 1/2 tablet on MWF and 1 tablet all other days  . losartan-hydrochlorothiazide (HYZAAR) 100-12.5 MG tablet TAKE 1 TABLET BY MOUTH DAILY. AS DIRECTED  . montelukast (SINGULAIR) 10 MG tablet TAKE 1 TABLET (10 MG TOTAL) BY MOUTH AT BEDTIME.  . [DISCONTINUED] amoxicillin-clavulanate (AUGMENTIN) 875-125 MG per tablet Take 1 tablet by mouth 2 (two) times daily.  . [DISCONTINUED] losartan-hydrochlorothiazide (HYZAAR) 100-12.5 MG per tablet TAKE 1 TABLET BY MOUTH DAILY. AS DIRECTED   No facility-administered encounter medications on file as of 06/28/2015.      Review of Systems  Constitutional: Negative.   HENT: Negative.        Seasonal allergies  Eyes: Negative.   Respiratory: Negative.   Cardiovascular: Negative.   Gastrointestinal: Negative.   Endocrine: Negative.   Genitourinary: Negative.   Musculoskeletal: Negative.   Skin: Negative.   Allergic/Immunologic: Negative.   Neurological: Negative.   Hematological: Negative.   Psychiatric/Behavioral: Negative.        Objective:   Physical Exam  Constitutional: She is oriented to person, place, and time. She appears well-developed and well-nourished. No distress.  HENT:  Head: Normocephalic and atraumatic.  Right Ear: External ear normal.  Left Ear: External  ear normal.  Mouth/Throat: Oropharynx is clear and moist.  Nasal congestion and turbinate swelling bilaterally  Eyes: Conjunctivae and EOM are normal. Pupils are equal, round, and reactive to light. Right eye exhibits no discharge. Left eye exhibits no discharge. No scleral icterus.  Eyes are slightly puffy and swollen  Neck: Normal  range of motion. Neck supple. No thyromegaly present.  Cardiovascular: Normal rate, regular rhythm and normal heart sounds.  Exam reveals no gallop and no friction rub.   No murmur heard. Heart is regular without murmurs  Pulmonary/Chest: Effort normal and breath sounds normal. No respiratory distress. She has no wheezes. She has no rales. She exhibits no tenderness.  The lungs are clear but the patient still has a slightly tight cough  Abdominal: Soft. Bowel sounds are normal. She exhibits no mass. There is no tenderness. There is no rebound and no guarding.  Musculoskeletal: Normal range of motion. She exhibits no edema.  Lymphadenopathy:    She has no cervical adenopathy.  Neurological: She is alert and oriented to person, place, and time. She has normal reflexes. No cranial nerve deficit.  Skin: Skin is warm and dry. No rash noted.  Psychiatric: She has a normal mood and affect. Her behavior is normal. Judgment and thought content normal.  Nursing note and vitals reviewed.  BP 120/78 mmHg  Pulse 83  Temp(Src) 97.4 F (36.3 C) (Oral)  Ht 5' 3"  (1.6 m)  Wt 202 lb (91.627 kg)  BMI 35.79 kg/m2  LMP 11/29/2011        Assessment & Plan:  1. Essential hypertension -The blood pressure is good today the patient will continue with current treatment - BMP8+EGFR; Future - CBC with Differential/Platelet; Future - Hepatic function panel; Future  2. Hyperlipidemia -She will continue with current Crestor treatment pending results of lab work - CBC with Differential/Platelet; Future - NMR, lipoprofile; Future  3. Gastroesophageal reflux disease, esophagitis presence not specified -She will try switching gradually off the Nexium and onto Zantac twice a day over the next couple months. - CBC with Differential/Platelet; Future  4. Hypothyroidism, unspecified hypothyroidism type -Continue current thyroid treatment until the next blood work is done - CBC with Differential/Platelet;  Future  5. Vitamin D deficiency -Continue vitamin D replacement pending results of lab work - CBC with Differential/Platelet; Future - VITAMIN D 25 Hydroxy (Vit-D Deficiency, Fractures); Future  6. Hemochromatosis -Continue to follow-up with hematology  7. Asthma, mild intermittent, uncomplicated -Increase Advair for a couple weeks and then go back to regular strength  8. Melanoma of upper arm, right ([place]) -Follow-up with dermatology every 6 months  9. Asthmatic bronchitis with exacerbation, moderate persistent -Continue with stronger Advair for the next couple weeks and go back to routine Advair use  All of the patient's meds will be refilled  Patient Instructions  Continue current medications. Continue good therapeutic lifestyle changes which include good diet and exercise. Fall precautions discussed with patient. If an FOBT was given today- please return it to our front desk. If you are over 47 years old - you may need Prevnar 21 or the adult Pneumonia vaccine.  **Flu shots are available--- please call and schedule a FLU-CLINIC appointment**  After your visit with [place] today you will receive a survey in the mail or online from [employer] regarding your care with [place]. Please take a moment to fill this out. Your feedback is very important to [place] as you can help [place] better understand your patient needs as well as improve  your experience and satisfaction. WE CARE ABOUT YOU!!!   Get flu shot from Hematology  Use the stronger Advair for 2 weeks Return for labs fasting. Stay well hydrated Use Flonase regularly Use cool mist humidifier in       Arrie Senate MD

## 2015-06-29 ENCOUNTER — Ambulatory Visit: Payer: BLUE CROSS/BLUE SHIELD | Admitting: Family Medicine

## 2015-06-29 ENCOUNTER — Other Ambulatory Visit: Payer: Self-pay | Admitting: Family Medicine

## 2015-06-29 NOTE — Telephone Encounter (Signed)
TSH was abnormal in 01/2015 and should have been rechecked in 6 weeks?

## 2015-07-02 ENCOUNTER — Other Ambulatory Visit (HOSPITAL_BASED_OUTPATIENT_CLINIC_OR_DEPARTMENT_OTHER): Payer: BLUE CROSS/BLUE SHIELD

## 2015-07-02 DIAGNOSIS — E785 Hyperlipidemia, unspecified: Secondary | ICD-10-CM

## 2015-07-02 DIAGNOSIS — E559 Vitamin D deficiency, unspecified: Secondary | ICD-10-CM

## 2015-07-02 DIAGNOSIS — C4361 Malignant melanoma of right upper limb, including shoulder: Secondary | ICD-10-CM

## 2015-07-02 DIAGNOSIS — I10 Essential (primary) hypertension: Secondary | ICD-10-CM

## 2015-07-02 DIAGNOSIS — E039 Hypothyroidism, unspecified: Secondary | ICD-10-CM

## 2015-07-02 DIAGNOSIS — K219 Gastro-esophageal reflux disease without esophagitis: Secondary | ICD-10-CM

## 2015-07-03 LAB — CBC WITH DIFFERENTIAL/PLATELET
BASOS: 1 %
Basophils Absolute: 0 10*3/uL (ref 0.0–0.2)
EOS (ABSOLUTE): 0.6 10*3/uL — AB (ref 0.0–0.4)
Eos: 8 %
Hematocrit: 42.8 % (ref 34.0–46.6)
Hemoglobin: 14.6 g/dL (ref 11.1–15.9)
IMMATURE GRANS (ABS): 0 10*3/uL (ref 0.0–0.1)
IMMATURE GRANULOCYTES: 0 %
LYMPHS: 35 %
Lymphocytes Absolute: 2.6 10*3/uL (ref 0.7–3.1)
MCH: 31.3 pg (ref 26.6–33.0)
MCHC: 34.1 g/dL (ref 31.5–35.7)
MCV: 92 fL (ref 79–97)
Monocytes Absolute: 0.3 10*3/uL (ref 0.1–0.9)
Monocytes: 5 %
NEUTROS PCT: 51 %
Neutrophils Absolute: 3.9 10*3/uL (ref 1.4–7.0)
PLATELETS: 174 10*3/uL (ref 150–379)
RBC: 4.67 x10E6/uL (ref 3.77–5.28)
RDW: 14.1 % (ref 12.3–15.4)
WBC: 7.5 10*3/uL (ref 3.4–10.8)

## 2015-07-03 LAB — BMP8+EGFR
BUN/Creatinine Ratio: 14 (ref 9–23)
BUN: 11 mg/dL (ref 6–24)
CALCIUM: 9.5 mg/dL (ref 8.7–10.2)
CO2: 26 mmol/L (ref 18–29)
CREATININE: 0.81 mg/dL (ref 0.57–1.00)
Chloride: 103 mmol/L (ref 97–106)
GFR calc Af Amer: 94 mL/min/{1.73_m2} (ref >59)
GFR, EST NON AFRICAN AMERICAN: 81 mL/min/{1.73_m2} (ref >59)
Glucose: 86 mg/dL (ref 65–99)
POTASSIUM: 3.9 mmol/L (ref 3.5–5.2)
Sodium: 145 mmol/L — ABNORMAL HIGH (ref 136–144)

## 2015-07-03 LAB — HEPATIC FUNCTION PANEL
ALT: 18 IU/L (ref 0–32)
AST: 24 IU/L (ref 0–40)
Albumin: 4.2 g/dL (ref 3.5–5.5)
Alkaline Phosphatase: 82 IU/L (ref 39–117)
BILIRUBIN TOTAL: 0.5 mg/dL (ref 0.0–1.2)
BILIRUBIN, DIRECT: 0.14 mg/dL (ref 0.00–0.40)
TOTAL PROTEIN: 6.7 g/dL (ref 6.0–8.5)

## 2015-07-03 LAB — LIPID PANEL
CHOL/HDL RATIO: 3.6 ratio (ref 0.0–4.4)
Cholesterol, Total: 137 mg/dL (ref 100–199)
HDL: 38 mg/dL — AB (ref >39)
LDL CALC: 63 mg/dL (ref 0–99)
Triglycerides: 178 mg/dL — ABNORMAL HIGH (ref 0–149)
VLDL CHOLESTEROL CAL: 36 mg/dL (ref 5–40)

## 2015-07-03 LAB — VITAMIN D 25 HYDROXY (VIT D DEFICIENCY, FRACTURES): Vit D, 25-Hydroxy: 26.3 ng/mL — ABNORMAL LOW (ref 30.0–100.0)

## 2015-07-08 LAB — SPECIMEN STATUS REPORT

## 2015-07-08 LAB — THYROID PANEL WITH TSH
Free Thyroxine Index: 2.4 (ref 1.2–4.9)
T3 Uptake Ratio: 27 % (ref 24–39)
T4, Total: 8.9 ug/dL (ref 4.5–12.0)
TSH: 3.95 u[IU]/mL (ref 0.450–4.500)

## 2015-07-09 NOTE — Progress Notes (Signed)
Patient aware.

## 2015-07-14 ENCOUNTER — Other Ambulatory Visit: Payer: Self-pay | Admitting: *Deleted

## 2015-07-14 DIAGNOSIS — C4361 Malignant melanoma of right upper limb, including shoulder: Secondary | ICD-10-CM

## 2015-07-15 ENCOUNTER — Encounter: Payer: Self-pay | Admitting: Hematology & Oncology

## 2015-07-15 ENCOUNTER — Other Ambulatory Visit (HOSPITAL_BASED_OUTPATIENT_CLINIC_OR_DEPARTMENT_OTHER): Payer: BLUE CROSS/BLUE SHIELD

## 2015-07-15 ENCOUNTER — Telehealth: Payer: Self-pay | Admitting: *Deleted

## 2015-07-15 ENCOUNTER — Ambulatory Visit (HOSPITAL_BASED_OUTPATIENT_CLINIC_OR_DEPARTMENT_OTHER): Payer: BLUE CROSS/BLUE SHIELD | Admitting: Hematology & Oncology

## 2015-07-15 VITALS — BP 128/72 | HR 78 | Temp 97.7°F | Resp 16 | Ht 63.0 in | Wt 204.0 lb

## 2015-07-15 DIAGNOSIS — Z23 Encounter for immunization: Secondary | ICD-10-CM

## 2015-07-15 DIAGNOSIS — C4361 Malignant melanoma of right upper limb, including shoulder: Secondary | ICD-10-CM

## 2015-07-15 DIAGNOSIS — C4362 Malignant melanoma of left upper limb, including shoulder: Secondary | ICD-10-CM

## 2015-07-15 LAB — CBC WITH DIFFERENTIAL (CANCER CENTER ONLY)
BASO#: 0 10*3/uL (ref 0.0–0.2)
BASO%: 0.6 % (ref 0.0–2.0)
EOS%: 8 % — AB (ref 0.0–7.0)
Eosinophils Absolute: 0.6 10*3/uL — ABNORMAL HIGH (ref 0.0–0.5)
HCT: 44.1 % (ref 34.8–46.6)
HEMOGLOBIN: 14.8 g/dL (ref 11.6–15.9)
LYMPH#: 2.3 10*3/uL (ref 0.9–3.3)
LYMPH%: 32.4 % (ref 14.0–48.0)
MCH: 30.8 pg (ref 26.0–34.0)
MCHC: 33.6 g/dL (ref 32.0–36.0)
MCV: 92 fL (ref 81–101)
MONO#: 0.4 10*3/uL (ref 0.1–0.9)
MONO%: 5.6 % (ref 0.0–13.0)
NEUT%: 53.4 % (ref 39.6–80.0)
NEUTROS ABS: 3.8 10*3/uL (ref 1.5–6.5)
PLATELETS: 163 10*3/uL (ref 145–400)
RBC: 4.81 10*6/uL (ref 3.70–5.32)
RDW: 13.4 % (ref 11.1–15.7)
WBC: 7.2 10*3/uL (ref 3.9–10.0)

## 2015-07-15 LAB — COMPREHENSIVE METABOLIC PANEL
ALK PHOS: 82 U/L (ref 40–150)
ALT: 23 U/L (ref 0–55)
ANION GAP: 13 meq/L — AB (ref 3–11)
AST: 25 U/L (ref 5–34)
Albumin: 4.1 g/dL (ref 3.5–5.0)
BUN: 12.6 mg/dL (ref 7.0–26.0)
CO2: 26 mEq/L (ref 22–29)
CREATININE: 0.9 mg/dL (ref 0.6–1.1)
Calcium: 9.8 mg/dL (ref 8.4–10.4)
Chloride: 103 mEq/L (ref 98–109)
EGFR: 75 mL/min/{1.73_m2} — AB (ref >90)
Glucose: 94 mg/dl (ref 70–140)
POTASSIUM: 3.4 meq/L — AB (ref 3.5–5.1)
SODIUM: 142 meq/L (ref 136–145)
Total Bilirubin: 0.51 mg/dL (ref 0.20–1.20)
Total Protein: 7.7 g/dL (ref 6.4–8.3)

## 2015-07-15 LAB — FERRITIN: Ferritin: 54 ng/ml (ref 9–269)

## 2015-07-15 LAB — IRON AND TIBC
%SAT: 28 % (ref 21–57)
IRON: 94 ug/dL (ref 41–142)
TIBC: 331 ug/dL (ref 236–444)
UIBC: 237 ug/dL (ref 120–384)

## 2015-07-15 MED ORDER — INFLUENZA VAC SPLIT QUAD 0.5 ML IM SUSY
0.5000 mL | PREFILLED_SYRINGE | Freq: Once | INTRAMUSCULAR | Status: DC
  Filled 2015-07-15: qty 0.5

## 2015-07-15 NOTE — Progress Notes (Signed)
Hematology and Oncology Follow Up Visit  Sarah Garrett [id number] November 12, 1960 54 y.o. 07/15/2015   Principle Diagnosis:  1. 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 2. 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  Current Therapy:    Observation     Interim History:  [name] is back for followup. Her daughter is doing much better. Her daughters found to have IIH. A lesion required a shunt to be placed. This helped out tremendously. She now is back in school.   Sarah Garrett had a good summer. She went up to [place]. She was with her sister. The had a really nice time together.   She is still working. She is quite busy. This is a busy time of year for her.   We last saw her in June, her ferritin was 84 with a iron saturation of 25%.   Her weight is up a little bit. She thinks this is probably caused her thyroid medication has been readjusted. She is thinking about seeing an endocrinologist who might be able to get her thyroid levels better controlled.   She did have a mammogram this year. This was done back in July. Everything was fine.   Overall, her performance status is ECOG 0.   Medications:  Current outpatient prescriptions:  .  albuterol (PROVENTIL HFA;VENTOLIN HFA) 108 (90 BASE) MCG/ACT inhaler, Inhale 2 puffs into the lungs every 6 (six) hours as needed., Disp: 18 g, Rfl: 4 .  cetirizine (ZYRTEC) 10 MG tablet, Take 10 mg by mouth daily.  , Disp: , Rfl:  .  CRESTOR 20 MG tablet, TAKE 1 TABLET (20 MG TOTAL) BY MOUTH DAILY., Disp: 30 tablet, Rfl: 4 .  esomeprazole (NEXIUM) 40 MG capsule, Take 1 capsule (40 mg total) by mouth as needed., Disp: 30 capsule, Rfl: 4 .  Fluticasone-Salmeterol (ADVAIR DISKUS) 100-50 MCG/DOSE AEPB, Inhale 1 puff BID, Disp: 60 each, Rfl: 3 .  furosemide (LASIX) 40 MG tablet, Take 0.5 tablets (20 mg total) by mouth daily., Disp: 30 tablet, Rfl: 3 .  hyoscyamine (HYOMAX-SL) 0.125 MG SL tablet, Place 1 tablet (0.125 mg  total) under the tongue as needed. Place 1-2 tablet under the tongue until it dissolves as needed, Disp: 30 tablet, Rfl: 2 .  levothyroxine (SYNTHROID, LEVOTHROID) 125 MCG tablet, TAKE A 1/2 TABLET ON MON, WED, FRI AND 1 TABLET ALL OTHER DAYS, Disp: 30 tablet, Rfl: 7 .  losartan-hydrochlorothiazide (HYZAAR) 100-12.5 MG tablet, TAKE 1 TABLET BY MOUTH DAILY. AS DIRECTED, Disp: 30 tablet, Rfl: 2 .  montelukast (SINGULAIR) 10 MG tablet, TAKE 1 TABLET (10 MG TOTAL) BY MOUTH AT BEDTIME., Disp: 30 tablet, Rfl: 4  Allergies:  Allergies  Allergen Reactions  . Ace Inhibitors     Per [id number]  . Actos [Pioglitazone Hydrochloride]     Per pt: unknown  . Aspirin Swelling    Swelling of mouth and throat  . Ciprofloxacin   . Crestor [Rosuvastatin Calcium]     Per pt: unknown  . Pneumococcal Vaccines     Red rash  . Sulfonamide Derivatives     Per pt: unknown    Past Medical History, Surgical history, Social history, and Family History were reviewed and updated.  Review of Systems: As above  Physical Exam:  height is 5\' 3"  (1.6 m) and weight is 204 lb (92.534 kg). Her oral temperature is 97.7 F (36.5 C). Her blood pressure is 128/72 and her pulse is 78. Her respiration is 16.  Well-developed and well-nourished white female. Head and neck exam shows no ocular or oral lesions. She has no palpable cervical or supraclavicular lymph nodes. Lungs are clear. Cardiac exam regular rate and rhythm with no murmurs rubs or bruits. Abdomen soft. She has good bowel sounds. There is no fluid wave. There is no palpable liver or spleen tip. Back exam shows no tenderness over the spine ribs or hips. Extremities shows no clubbing cyanosis or edema. Skin exam shows no  suspicious skin lesions. Neurological exam is nonfocal.  Lab Results  Component Value Date   WBC 7.2 07/15/2015   HGB 14.8 07/15/2015   HCT 44.1 07/15/2015   MCV 92 07/15/2015   PLT 163 07/15/2015     Chemistry      Component Value  Date/Time   NA 145* 07/02/2015 0921   NA 142 01/13/2015 0831   NA 144 07/15/2014 0833   K 3.9 07/02/2015 0921   K 3.8 07/15/2014 0833   CL 103 07/02/2015 0921   CL 101 07/15/2014 0833   CO2 26 07/02/2015 0921   CO2 30 07/15/2014 0833   BUN 11 07/02/2015 0921   BUN 9 01/13/2015 0831   BUN 12 07/15/2014 0833   CREATININE 0.81 07/02/2015 0921   CREATININE 0.8 07/15/2014 0833      Component Value Date/Time   CALCIUM 9.5 07/02/2015 0921   CALCIUM 9.0 07/15/2014 0833   ALKPHOS 82 07/02/2015 0921   ALKPHOS 81 07/15/2014 0833   AST 24 07/02/2015 0921   AST 30 07/15/2014 0833   ALT 18 07/02/2015 0921   ALT 30 07/15/2014 0833   BILITOT 0.5 07/02/2015 0921   BILITOT 0.6 01/13/2015 0831   BILITOT 0.70 07/15/2014 0833         Impression and Plan: Sarah Garrett is 54 year old white female with hemachromatosis. She has a double mutation. However, she really hadn't had no problems with her iron being high. She was last phlebotomized back in June 2015.  I will see what her iron studies show. Back in June of this issue, ferritin was up to 84. She may need to be phlebotomized.  We will plan to get her back in another 6 months.  Sarah Napoleon, MD 12/15/20169:36 AM

## 2015-07-15 NOTE — Telephone Encounter (Addendum)
Patient aware of results  ----- Message from [name], MD sent at 07/15/2015 11:40 AM EST ----- Call - ferritin is only 54!!  This is great!!  No phlebotomy!!!  Sarah Garrett

## 2015-07-20 ENCOUNTER — Other Ambulatory Visit: Payer: Self-pay | Admitting: Family Medicine

## 2015-09-03 ENCOUNTER — Other Ambulatory Visit: Payer: Self-pay | Admitting: Family Medicine

## 2015-10-04 ENCOUNTER — Other Ambulatory Visit: Payer: Self-pay | Admitting: Family Medicine

## 2015-10-15 ENCOUNTER — Other Ambulatory Visit: Payer: Self-pay | Admitting: Family Medicine

## 2015-10-19 ENCOUNTER — Ambulatory Visit: Payer: BLUE CROSS/BLUE SHIELD

## 2015-10-19 ENCOUNTER — Encounter: Payer: Self-pay | Admitting: Nurse Practitioner

## 2015-10-19 ENCOUNTER — Ambulatory Visit (INDEPENDENT_AMBULATORY_CARE_PROVIDER_SITE_OTHER): Payer: BLUE CROSS/BLUE SHIELD | Admitting: Nurse Practitioner

## 2015-10-19 VITALS — BP 140/99 | HR 84 | Temp 97.1°F | Ht 63.0 in | Wt 203.0 lb

## 2015-10-19 DIAGNOSIS — M546 Pain in thoracic spine: Secondary | ICD-10-CM | POA: Diagnosis not present

## 2015-10-19 DIAGNOSIS — N3001 Acute cystitis with hematuria: Secondary | ICD-10-CM

## 2015-10-19 DIAGNOSIS — R39198 Other difficulties with micturition: Secondary | ICD-10-CM | POA: Diagnosis not present

## 2015-10-19 LAB — URINALYSIS, COMPLETE
BILIRUBIN UA: NEGATIVE
Glucose, UA: NEGATIVE
KETONES UA: NEGATIVE
Nitrite, UA: NEGATIVE
PH UA: 5.5 (ref 5.0–7.5)
SPEC GRAV UA: 1.025 (ref 1.005–1.030)
UUROB: 0.2 mg/dL (ref 0.2–1.0)

## 2015-10-19 LAB — MICROSCOPIC EXAMINATION

## 2015-10-19 MED ORDER — DOXYCYCLINE HYCLATE 100 MG PO TABS: 100.0000 mg | ORAL_TABLET | Freq: Two times a day (BID) | ORAL | Status: DC

## 2015-10-19 NOTE — Patient Instructions (Signed)

## 2015-10-19 NOTE — Addendum Note (Signed)
Addended by: Chevis Pretty on: 10/19/2015 04:20 PM   Modules accepted: Orders

## 2015-10-19 NOTE — Progress Notes (Signed)
   Subjective:    Patient ID: LO PAAP, female    DOB: 22-Jun-1961, 55 y.o.   MRN: [id number]  HPI Patient in today c/o back pain that started over a week ago- it is higher then where her back usually hurts- pain radiates around to front on both sides. Rates pain 5/10. Pain is constant in back but sometimes does not radiate. Movement does not effect back pain.Pain can decrease down to 3/10. She has symptoms of urinary urgency and frequency with scant amounts at times. Some nocturia which is not normal for her. Slight pelvic pressure.    Review of Systems  Constitutional: Negative for fever and chills.  Respiratory: Negative.   Cardiovascular: Negative.   Gastrointestinal: Negative.   Genitourinary: Positive for urgency, frequency and pelvic pain (more like pressure). Negative for dysuria.  Musculoskeletal: Positive for back pain.  Neurological: Negative.   Hematological: Negative.   Psychiatric/Behavioral: Negative.   All other systems reviewed and are negative.      Objective:   Physical Exam  Constitutional: She is oriented to person, place, and time. She appears well-developed and well-nourished.  Cardiovascular: Normal rate, regular rhythm and normal heart sounds.   Pulmonary/Chest: Effort normal and breath sounds normal.  Abdominal: Soft. Bowel sounds are normal. There is tenderness (left pelvic pain on palpation).  Neurological: She is alert and oriented to person, place, and time.  Skin: Skin is warm.  Psychiatric: She has a normal mood and affect. Her behavior is normal. Judgment and thought content normal.   BP 140/99 mmHg  Pulse 84  Temp(Src) 97.1 F (36.2 C) (Oral)  Ht 5\' 3"  (1.6 m)  Wt 203 lb (92.08 kg)  BMI 35.97 kg/m2  LMP 11/29/2011  Urine (+) leukocytes     Assessment & Plan:   1. Difficulty in urination   2. Acute cystitis with hematuria   3. Bilateral thoracic back pain    Take medication as prescribe Cotton underwear Take shower not  bath Cranberry juice, yogurt Force fluids AZO over the counter X2 days Culture pending RTO prn  Orders Placed This Encounter  Procedures  . Urine culture    Order Specific Question:  Source    Answer:  urine  . Urinalysis, Complete   Meds ordered this encounter  Medications  . doxycycline (VIBRA-TABS) 100 MG tablet    Sig: Take 1 tablet (100 mg total) by mouth 2 (two) times daily. 1 po bid    Dispense:  20 tablet    Refill:  0    Order Specific Question:  Supervising Provider    Answer:  Chipper Herb [[id number]]   Mary-Margaret Hassell Done, FNP

## 2015-10-29 ENCOUNTER — Other Ambulatory Visit: Payer: BLUE CROSS/BLUE SHIELD

## 2015-10-29 DIAGNOSIS — E039 Hypothyroidism, unspecified: Secondary | ICD-10-CM

## 2015-10-29 DIAGNOSIS — I10 Essential (primary) hypertension: Secondary | ICD-10-CM

## 2015-10-29 DIAGNOSIS — E559 Vitamin D deficiency, unspecified: Secondary | ICD-10-CM

## 2015-10-29 DIAGNOSIS — E785 Hyperlipidemia, unspecified: Secondary | ICD-10-CM

## 2015-10-30 LAB — CBC WITH DIFFERENTIAL/PLATELET
BASOS: 0 %
Basophils Absolute: 0 10*3/uL (ref 0.0–0.2)
EOS (ABSOLUTE): 0.5 10*3/uL — ABNORMAL HIGH (ref 0.0–0.4)
EOS: 7 %
HEMATOCRIT: 43.7 % (ref 34.0–46.6)
Hemoglobin: 14.3 g/dL (ref 11.1–15.9)
IMMATURE GRANS (ABS): 0 10*3/uL (ref 0.0–0.1)
IMMATURE GRANULOCYTES: 0 %
Lymphocytes Absolute: 2.7 10*3/uL (ref 0.7–3.1)
Lymphs: 35 %
MCH: 30.5 pg (ref 26.6–33.0)
MCHC: 32.7 g/dL (ref 31.5–35.7)
MCV: 93 fL (ref 79–97)
MONOS ABS: 0.3 10*3/uL (ref 0.1–0.9)
Monocytes: 5 %
NEUTROS PCT: 53 %
Neutrophils Absolute: 4 10*3/uL (ref 1.4–7.0)
Platelets: 173 10*3/uL (ref 150–379)
RBC: 4.69 x10E6/uL (ref 3.77–5.28)
RDW: 14.3 % (ref 12.3–15.4)
WBC: 7.6 10*3/uL (ref 3.4–10.8)

## 2015-10-30 LAB — THYROID PANEL WITH TSH
FREE THYROXINE INDEX: 2.5 (ref 1.2–4.9)
T3 UPTAKE RATIO: 26 % (ref 24–39)
T4, Total: 9.6 ug/dL (ref 4.5–12.0)
TSH: 3.3 u[IU]/mL (ref 0.450–4.500)

## 2015-10-30 LAB — BMP8+EGFR
BUN/Creatinine Ratio: 11 (ref 9–23)
BUN: 9 mg/dL (ref 6–24)
CALCIUM: 9.7 mg/dL (ref 8.7–10.2)
CO2: 26 mmol/L (ref 18–29)
Chloride: 102 mmol/L (ref 96–106)
Creatinine, Ser: 0.84 mg/dL (ref 0.57–1.00)
GFR, EST AFRICAN AMERICAN: 90 mL/min/{1.73_m2} (ref >59)
GFR, EST NON AFRICAN AMERICAN: 78 mL/min/{1.73_m2} (ref >59)
Glucose: 89 mg/dL (ref 65–99)
Potassium: 3.7 mmol/L (ref 3.5–5.2)
Sodium: 147 mmol/L — ABNORMAL HIGH (ref 134–144)

## 2015-10-30 LAB — LIPID PANEL
CHOL/HDL RATIO: 3.4 ratio (ref 0.0–4.4)
Cholesterol, Total: 130 mg/dL (ref 100–199)
HDL: 38 mg/dL — AB (ref >39)
LDL Calculated: 57 mg/dL (ref 0–99)
Triglycerides: 174 mg/dL — ABNORMAL HIGH (ref 0–149)
VLDL CHOLESTEROL CAL: 35 mg/dL (ref 5–40)

## 2015-10-30 LAB — HEPATIC FUNCTION PANEL
ALT: 16 IU/L (ref 0–32)
AST: 21 IU/L (ref 0–40)
Albumin: 4.3 g/dL (ref 3.5–5.5)
Alkaline Phosphatase: 77 IU/L (ref 39–117)
Bilirubin Total: 0.3 mg/dL (ref 0.0–1.2)
Bilirubin, Direct: 0.1 mg/dL (ref 0.00–0.40)
TOTAL PROTEIN: 7 g/dL (ref 6.0–8.5)

## 2015-10-30 LAB — VITAMIN D 25 HYDROXY (VIT D DEFICIENCY, FRACTURES): VIT D 25 HYDROXY: 24.5 ng/mL — AB (ref 30.0–100.0)

## 2015-11-03 ENCOUNTER — Ambulatory Visit: Payer: BLUE CROSS/BLUE SHIELD

## 2015-11-03 ENCOUNTER — Ambulatory Visit (INDEPENDENT_AMBULATORY_CARE_PROVIDER_SITE_OTHER): Payer: BLUE CROSS/BLUE SHIELD | Admitting: Family Medicine

## 2015-11-03 ENCOUNTER — Encounter: Payer: Self-pay | Admitting: Family Medicine

## 2015-11-03 ENCOUNTER — Encounter (INDEPENDENT_AMBULATORY_CARE_PROVIDER_SITE_OTHER): Payer: Self-pay

## 2015-11-03 VITALS — BP 119/76 | HR 88 | Temp 97.8°F | Ht 63.0 in | Wt 203.0 lb

## 2015-11-03 DIAGNOSIS — I10 Essential (primary) hypertension: Secondary | ICD-10-CM | POA: Diagnosis not present

## 2015-11-03 DIAGNOSIS — C4361 Malignant melanoma of right upper limb, including shoulder: Secondary | ICD-10-CM | POA: Diagnosis not present

## 2015-11-03 DIAGNOSIS — Z1211 Encounter for screening for malignant neoplasm of colon: Secondary | ICD-10-CM

## 2015-11-03 DIAGNOSIS — E039 Hypothyroidism, unspecified: Secondary | ICD-10-CM

## 2015-11-03 DIAGNOSIS — J4520 Mild intermittent asthma, uncomplicated: Secondary | ICD-10-CM

## 2015-11-03 DIAGNOSIS — E785 Hyperlipidemia, unspecified: Secondary | ICD-10-CM

## 2015-11-03 DIAGNOSIS — K219 Gastro-esophageal reflux disease without esophagitis: Secondary | ICD-10-CM | POA: Diagnosis not present

## 2015-11-03 DIAGNOSIS — Z780 Asymptomatic menopausal state: Secondary | ICD-10-CM | POA: Diagnosis not present

## 2015-11-03 DIAGNOSIS — E559 Vitamin D deficiency, unspecified: Secondary | ICD-10-CM

## 2015-11-03 DIAGNOSIS — Z13820 Encounter for screening for osteoporosis: Secondary | ICD-10-CM

## 2015-11-03 DIAGNOSIS — J42 Unspecified chronic bronchitis: Secondary | ICD-10-CM | POA: Diagnosis not present

## 2015-11-03 MED ORDER — MONTELUKAST SODIUM 10 MG PO TABS: ORAL_TABLET | ORAL | Status: DC

## 2015-11-03 MED ORDER — LOSARTAN POTASSIUM-HCTZ 100-12.5 MG PO TABS: ORAL_TABLET | ORAL | Status: DC

## 2015-11-03 MED ORDER — ESOMEPRAZOLE MAGNESIUM 40 MG PO CPDR: 40.0000 mg | DELAYED_RELEASE_CAPSULE | ORAL | Status: DC | PRN

## 2015-11-03 MED ORDER — FLUTICASONE-SALMETEROL 100-50 MCG/DOSE IN AEPB: INHALATION_SPRAY | RESPIRATORY_TRACT | Status: DC

## 2015-11-03 MED ORDER — ROSUVASTATIN CALCIUM 20 MG PO TABS: ORAL_TABLET | ORAL | Status: DC

## 2015-11-03 MED ORDER — FUROSEMIDE 40 MG PO TABS: ORAL_TABLET | ORAL | Status: DC

## 2015-11-03 MED ORDER — LEVOTHYROXINE SODIUM 125 MCG PO TABS: ORAL_TABLET | ORAL | Status: DC

## 2015-11-03 MED ORDER — ALBUTEROL SULFATE HFA 108 (90 BASE) MCG/ACT IN AERS: 2.0000 | INHALATION_SPRAY | Freq: Four times a day (QID) | RESPIRATORY_TRACT | Status: DC | PRN

## 2015-11-03 NOTE — Addendum Note (Signed)
Addended by: Zannie Cove on: 11/03/2015 05:32 PM   Modules accepted: Orders

## 2015-11-03 NOTE — Progress Notes (Signed)
Subjective:    Patient ID: Sarah Garrett, female    DOB: [birth year], 55 y.o.   MRN: [id number]  HPI Pt here for follow up and management of chronic medical problems which includes hypothyroid, hyperlipidemia, and asthma. She is taking medications regularly.The patient is doing well today overall. She has no specific complaints. She is requesting refills on all of her medicines. She has had her lab work and we will review that with her today she is also going to be getting a DEXA scan. She will be given an FOBT to return. With her lab work the CBC was normal with a good hemoglobin at 14.3. The blood sugar was good at 89 and the creatinine and electrolytes including potassium are good except the sodium was slightly elevated and that was consistent with readings from the past. Cholesterol numbers with traditional lipid testing had a total LDL C that was excellent at 57. The triglycerides were slightly elevated at 174. All liver function tests were normal. The vitamin D level was low and she will be started on 50,000 units of vitamin D once weekly. All thyroid function tests were within normal limits. The patient denies any chest pain shortness of breath trouble swallowing heartburn indigestion nausea vomiting diarrhea or blood in the stool. She's had a recent urinary tract infection. She has had some minimal heartburn and does not take her proton pump inhibitor on a regular basis. She has had a recent urinary tract infection and this is improved.     Patient Active Problem List   Diagnosis Date Noted  . Contact dermatitis 03/24/2013  . Asthma 03/24/2013  . Melanoma of upper arm, right. T1a., History of 09/07/2011  . Hemochromatosis 07/07/2011  . Hypothyroidism 02/17/2008  . Hyperlipidemia 10/11/2007  . ANEMIA-NOS 10/11/2007  . Essential hypertension 10/11/2007  . ALLERGIC RHINITIS 10/11/2007  . BRONCHITIS, CHRONIC 10/11/2007  . GERD 10/11/2007  . ADENOIDECTOMY, HX OF 10/11/2007    Outpatient Encounter Prescriptions as of 11/03/2015  Medication Sig  . albuterol (PROVENTIL HFA;VENTOLIN HFA) 108 (90 BASE) MCG/ACT inhaler Inhale 2 puffs into the lungs every 6 (six) hours as needed.  . cetirizine (ZYRTEC) 10 MG tablet Take 10 mg by mouth daily.    . CRESTOR 20 MG tablet TAKE 1 TABLET (20 MG TOTAL) BY MOUTH DAILY.  Marland Kitchen esomeprazole (NEXIUM) 40 MG capsule Take 1 capsule (40 mg total) by mouth as needed.  . Fluticasone-Salmeterol (ADVAIR DISKUS) 100-50 MCG/DOSE AEPB Inhale 1 puff BID  . furosemide (LASIX) 40 MG tablet TAKE 1 TABLET EVERY DAY AS NEEDED  . hyoscyamine (HYOMAX-SL) 0.125 MG SL tablet Place 1 tablet (0.125 mg total) under the tongue as needed. Place 1-2 tablet under the tongue until it dissolves as needed  . levothyroxine (SYNTHROID, LEVOTHROID) 125 MCG tablet TAKE A 1/2 TABLET ON MON, WED, FRI AND 1 TABLET ALL OTHER DAYS  . losartan-hydrochlorothiazide (HYZAAR) 100-12.5 MG tablet TAKE 1 TABLET BY MOUTH DAILY. AS DIRECTED  . montelukast (SINGULAIR) 10 MG tablet TAKE 1 TABLET (10 MG TOTAL) BY MOUTH AT BEDTIME.  . [DISCONTINUED] doxycycline (VIBRA-TABS) 100 MG tablet Take 1 tablet (100 mg total) by mouth 2 (two) times daily. 1 po bid   No facility-administered encounter medications on file as of 11/03/2015.      Review of Systems  Constitutional: Negative.   HENT: Negative.   Eyes: Negative.   Respiratory: Negative.   Cardiovascular: Negative.   Gastrointestinal: Negative.   Endocrine: Negative.   Genitourinary: Negative.   Musculoskeletal: Negative.  Skin: Negative.   Allergic/Immunologic: Negative.   Neurological: Negative.   Hematological: Negative.   Psychiatric/Behavioral: Negative.        Objective:   Physical Exam  Constitutional: She is oriented to person, place, and time. She appears well-developed and well-nourished. No distress.  HENT:  Head: Normocephalic and atraumatic.  Right Ear: External ear normal.  Left Ear: External ear normal.   Nose: Nose normal.  Mouth/Throat: Oropharynx is clear and moist.  Eyes: Conjunctivae and EOM are normal. Pupils are equal, round, and reactive to light. Right eye exhibits no discharge. Left eye exhibits no discharge. No scleral icterus.  Neck: Normal range of motion. Neck supple. No JVD present. No thyromegaly present.  Cardiovascular: Normal rate, regular rhythm, normal heart sounds and intact distal pulses.   No murmur heard. Pulmonary/Chest: Effort normal and breath sounds normal. No respiratory distress. She has no wheezes. She has no rales. She exhibits no tenderness.  Lungs were clear anteriorly and posteriorly  Abdominal: Soft. Bowel sounds are normal. She exhibits no mass. There is no tenderness. There is no rebound and no guarding.  No liver or spleen enlargement and no epigastric tenderness and normal bowel sounds  Musculoskeletal: Normal range of motion. She exhibits no edema or tenderness.  Lymphadenopathy:    She has no cervical adenopathy.  Neurological: She is alert and oriented to person, place, and time. She has normal reflexes. No cranial nerve deficit.  Skin: Skin is warm and dry. No rash noted.  Psychiatric: She has a normal mood and affect. Her behavior is normal. Judgment and thought content normal.  Nursing note and vitals reviewed.   BP 119/76 mmHg  Pulse 88  Temp(Src) 97.8 F (36.6 C) (Oral)  Ht 5\' 3"  (1.6 m)  Wt 203 lb (92.08 kg)  BMI 35.97 kg/m2  LMP 11/29/2011   The patient is getting her DEXA scan today.----- she has normal bone density.    Assessment & Plan:  1. Essential hypertension -Blood pressure is good today and she will continue to take her current medication.  2. Hyperlipidemia -Cholesterol numbers were improved except for a low HDL and a slightly elevated triglyceride. The LDL C was excellent. She will continue with current treatment and more aggressive therapeutic lifestyle changes  3. Gastroesophageal reflux disease, esophagitis  presence not specified -She is having minimal reflux and only takes her Nexium when necessary. She will try ranitidine over-the-counter instead to see if this works better  4. Hypothyroidism, unspecified hypothyroidism type -All thyroid tests were good and she will continue with current treatment  5. Vitamin D deficiency -DEXA scan was normal and she will continue with vitamin D and calcium - DG Bone Density; Future  6. Hemochromatosis -Continue to follow-up with hematology every 6 months, Dr. Marin Olp  7. Asthma, mild intermittent, uncomplicated -Continue with current inhalants and respiratory protection especially during the spring pollen season  8. Screening for osteoporosis -Continue with vitamin D replacement and calcium. She must take her vitamin D on a regular basis once a week and stay on this until we recheck in the next visit - DG Bone Density; Future  9. Postmenopausal - DG Bone Density; Future  10. Special screening for malignant neoplasms, colon - Fecal occult blood, imunochemical; Future  11. Melanoma of upper arm, right ([place]) -Continue to follow-up with dermatology  12. BRONCHITIS, CHRONIC -The patient's breathing is stable today and she will continue with current breathing treatments and meds.  No orders of the defined types were placed in this  encounter.   Patient Instructions  Continue current medications. Continue good therapeutic lifestyle changes which include good diet and exercise. Fall precautions discussed with patient. If an FOBT was given today- please return it to our front desk. If you are over 18 years old - you may need Prevnar 80 or the adult Pneumonia vaccine.  **Flu shots are available--- please call and schedule a FLU-CLINIC appointment**  After your visit with [place] today you will receive a survey in the mail or online from [employer] regarding your care with [place]. Please take a moment to fill this out. Your feedback is very important to [place] as  you can help [place] better understand your patient needs as well as improve your experience and satisfaction. WE CARE ABOUT YOU!!!   Continue current medications Continue to follow-up with hematology Continue to work with diet, cholesterol and triglycerides Take the vitamin D that is prescribed once weekly and did not stop taking until we recheck her next vitamin D level     Arrie Senate MD

## 2015-11-03 NOTE — Patient Instructions (Addendum)
Continue current medications. Continue good therapeutic lifestyle changes which include good diet and exercise. Fall precautions discussed with patient. If an FOBT was given today- please return it to our front desk. If you are over 57 years old - you may need Prevnar 21 or the adult Pneumonia vaccine.  **Flu shots are available--- please call and schedule a FLU-CLINIC appointment**  After your visit with [place] today you will receive a survey in the mail or online from [employer] regarding your care with [place]. Please take a moment to fill this out. Your feedback is very important to [place] as you can help [place] better understand your patient needs as well as improve your experience and satisfaction. WE CARE ABOUT YOU!!!   Continue current medications Continue to follow-up with hematology Continue to work with diet, cholesterol and triglycerides Take the vitamin D that is prescribed once weekly and did not stop taking until we recheck her next vitamin D level

## 2015-12-14 DIAGNOSIS — L820 Inflamed seborrheic keratosis: Secondary | ICD-10-CM | POA: Diagnosis not present

## 2015-12-14 DIAGNOSIS — D1801 Hemangioma of skin and subcutaneous tissue: Secondary | ICD-10-CM | POA: Diagnosis not present

## 2015-12-14 DIAGNOSIS — D235 Other benign neoplasm of skin of trunk: Secondary | ICD-10-CM | POA: Diagnosis not present

## 2015-12-14 DIAGNOSIS — L814 Other melanin hyperpigmentation: Secondary | ICD-10-CM | POA: Diagnosis not present

## 2016-01-03 ENCOUNTER — Encounter: Payer: Self-pay | Admitting: *Deleted

## 2016-01-10 ENCOUNTER — Other Ambulatory Visit: Payer: Self-pay | Admitting: Family Medicine

## 2016-01-13 ENCOUNTER — Encounter: Payer: Self-pay | Admitting: Hematology & Oncology

## 2016-01-13 ENCOUNTER — Ambulatory Visit (HOSPITAL_BASED_OUTPATIENT_CLINIC_OR_DEPARTMENT_OTHER): Payer: BLUE CROSS/BLUE SHIELD | Admitting: Hematology & Oncology

## 2016-01-13 ENCOUNTER — Other Ambulatory Visit (HOSPITAL_BASED_OUTPATIENT_CLINIC_OR_DEPARTMENT_OTHER): Payer: BLUE CROSS/BLUE SHIELD

## 2016-01-13 DIAGNOSIS — C4362 Malignant melanoma of left upper limb, including shoulder: Secondary | ICD-10-CM

## 2016-01-13 DIAGNOSIS — Z23 Encounter for immunization: Secondary | ICD-10-CM

## 2016-01-13 DIAGNOSIS — C4361 Malignant melanoma of right upper limb, including shoulder: Secondary | ICD-10-CM

## 2016-01-13 DIAGNOSIS — Z85820 Personal history of malignant melanoma of skin: Secondary | ICD-10-CM | POA: Diagnosis not present

## 2016-01-13 LAB — IRON AND TIBC
%SAT: 27 % (ref 21–57)
Iron: 88 ug/dL (ref 41–142)
TIBC: 328 ug/dL (ref 236–444)
UIBC: 239 ug/dL (ref 120–384)

## 2016-01-13 LAB — CBC WITH DIFFERENTIAL (CANCER CENTER ONLY)
BASO#: 0 10*3/uL (ref 0.0–0.2)
BASO%: 0.4 % (ref 0.0–2.0)
EOS ABS: 0.6 10*3/uL — AB (ref 0.0–0.5)
EOS%: 6.8 % (ref 0.0–7.0)
HEMATOCRIT: 43.6 % (ref 34.8–46.6)
HEMOGLOBIN: 15.5 g/dL (ref 11.6–15.9)
LYMPH#: 2.8 10*3/uL (ref 0.9–3.3)
LYMPH%: 34.2 % (ref 14.0–48.0)
MCH: 31.8 pg (ref 26.0–34.0)
MCHC: 35.6 g/dL (ref 32.0–36.0)
MCV: 89 fL (ref 81–101)
MONO#: 0.4 10*3/uL (ref 0.1–0.9)
MONO%: 5.1 % (ref 0.0–13.0)
NEUT%: 53.5 % (ref 39.6–80.0)
NEUTROS ABS: 4.3 10*3/uL (ref 1.5–6.5)
Platelets: 172 10*3/uL (ref 145–400)
RBC: 4.88 10*6/uL (ref 3.70–5.32)
RDW: 13.5 % (ref 11.1–15.7)
WBC: 8 10*3/uL (ref 3.9–10.0)

## 2016-01-13 LAB — COMPREHENSIVE METABOLIC PANEL
ALBUMIN: 4.2 g/dL (ref 3.5–5.0)
ALK PHOS: 79 U/L (ref 40–150)
ALT: 23 U/L (ref 0–55)
AST: 23 U/L (ref 5–34)
Anion Gap: 10 mEq/L (ref 3–11)
BILIRUBIN TOTAL: 0.73 mg/dL (ref 0.20–1.20)
BUN: 10.9 mg/dL (ref 7.0–26.0)
CALCIUM: 10.2 mg/dL (ref 8.4–10.4)
CO2: 31 mEq/L — ABNORMAL HIGH (ref 22–29)
Chloride: 101 mEq/L (ref 98–109)
Creatinine: 0.9 mg/dL (ref 0.6–1.1)
EGFR: 75 mL/min/{1.73_m2} — AB (ref >90)
GLUCOSE: 92 mg/dL (ref 70–140)
Potassium: 3.2 mEq/L — ABNORMAL LOW (ref 3.5–5.1)
SODIUM: 141 meq/L (ref 136–145)
TOTAL PROTEIN: 7.9 g/dL (ref 6.4–8.3)

## 2016-01-13 LAB — FERRITIN: Ferritin: 84 ng/ml (ref 9–269)

## 2016-01-13 NOTE — Progress Notes (Signed)
Hematology and Oncology Follow Up Visit  Sarah Garrett [id number] 05/28/1961 54 y.o. 01/13/2016   Principle Diagnosis:  1. 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 2. 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  Current Therapy:    Observation     Interim History:  [name] is back for followup. Her daughter is doing much better. She has she will be graduating college in December. His only she will probably get a job down around [place].  Her daughters IIH cc redoing quite well.   We saw [name] back in December, her ferritin was 54 with an iron saturation 28%.   Her 30th anniversary is coming up. Hopefully she and her husband will be able to have a little vacation.   She's worried about her thyroid levels. She wants go to an endocrinologist. Her husband sees an endocrinologist and hopefully she will be able to get in with her.   She has had no further skin lesions removed.  Overall, her performance status is ECOG 0.   Medications:  Current outpatient prescriptions:  .  albuterol (PROVENTIL HFA;VENTOLIN HFA) 108 (90 Base) MCG/ACT inhaler, Inhale 2 puffs into the lungs every 6 (six) hours as needed., Disp: 18 g, Rfl: 6 .  cetirizine (ZYRTEC) 10 MG tablet, Take 10 mg by mouth daily.  , Disp: , Rfl:  .  esomeprazole (NEXIUM) 40 MG capsule, Take 1 capsule (40 mg total) by mouth as needed., Disp: 30 capsule, Rfl: 6 .  Fluticasone-Salmeterol (ADVAIR DISKUS) 100-50 MCG/DOSE AEPB, Inhale 1 puff BID, Disp: 60 each, Rfl: 6 .  furosemide (LASIX) 40 MG tablet, TAKE 1 TABLET EVERY DAY AS NEEDED, Disp: 30 tablet, Rfl: 6 .  furosemide (LASIX) 40 MG tablet, TAKE 1 TABLET EVERY DAY AS NEEDED, Disp: 30 tablet, Rfl: 5 .  hyoscyamine (HYOMAX-SL) 0.125 MG SL tablet, Place 1 tablet (0.125 mg total) under the tongue as needed. Place 1-2 tablet under the tongue until it dissolves as needed, Disp: 30 tablet, Rfl: 2 .  levothyroxine (SYNTHROID, LEVOTHROID) 125 MCG  tablet, TAKE A 1/2 TABLET ON MON, WED, FRI AND 1 TABLET ALL OTHER DAYS, Disp: 30 tablet, Rfl: 6 .  losartan-hydrochlorothiazide (HYZAAR) 100-12.5 MG tablet, TAKE 1 TABLET BY MOUTH DAILY. AS DIRECTED, Disp: 30 tablet, Rfl: 6 .  montelukast (SINGULAIR) 10 MG tablet, TAKE 1 TABLET (10 MG TOTAL) BY MOUTH AT BEDTIME., Disp: 30 tablet, Rfl: 6 .  rosuvastatin (CRESTOR) 20 MG tablet, TAKE 1 TABLET (20 MG TOTAL) BY MOUTH DAILY., Disp: 30 tablet, Rfl: 6  Allergies:  Allergies  Allergen Reactions  . Ace Inhibitors     Per [id number]  . Actos [Pioglitazone Hydrochloride]     Per pt: unknown  . Aspirin Swelling    Swelling of mouth and throat  . Ciprofloxacin   . Crestor [Rosuvastatin Calcium]     Per pt: unknown  . Pneumococcal Vaccines     Red rash  . Sulfonamide Derivatives     Per pt: unknown    Past Medical History, Surgical history, Social history, and Family History were reviewed and updated.  Review of Systems: As above  Physical Exam:  height is 5\' 3"  (1.6 m) and weight is 206 lb (93.441 kg). Her oral temperature is 97.8 F (36.6 C). Her blood pressure is 106/80 and her pulse is 79. Her respiration is 16.   Well-developed and well-nourished white female. Head and neck exam shows no ocular or oral lesions. She has no palpable cervical  or supraclavicular lymph nodes. Lungs are clear. Cardiac exam regular rate and rhythm with no murmurs rubs or bruits. Abdomen soft. She has good bowel sounds. There is no fluid wave. There is no palpable liver or spleen tip. Back exam shows no tenderness over the spine ribs or hips. Extremities shows no clubbing cyanosis or edema. Skin exam shows no  suspicious skin lesions. Neurological exam is nonfocal.  Lab Results  Component Value Date   WBC 8.0 01/13/2016   HGB 15.5 01/13/2016   HCT 43.6 01/13/2016   MCV 89 01/13/2016   PLT 172 01/13/2016     Chemistry      Component Value Date/Time   NA 147* 10/29/2015 0800   NA 142 07/15/2015 0841   NA  142 01/13/2015 0831   NA 144 07/15/2014 0833   K 3.7 10/29/2015 0800   K 3.4* 07/15/2015 0841   K 3.8 07/15/2014 0833   CL 102 10/29/2015 0800   CL 101 07/15/2014 0833   CO2 26 10/29/2015 0800   CO2 26 07/15/2015 0841   CO2 30 07/15/2014 0833   BUN 9 10/29/2015 0800   BUN 12.6 07/15/2015 0841   BUN 9 01/13/2015 0831   BUN 12 07/15/2014 0833   CREATININE 0.84 10/29/2015 0800   CREATININE 0.9 07/15/2015 0841   CREATININE 0.8 07/15/2014 0833      Component Value Date/Time   CALCIUM 9.7 10/29/2015 0800   CALCIUM 9.8 07/15/2015 0841   CALCIUM 9.0 07/15/2014 0833   ALKPHOS 77 10/29/2015 0800   ALKPHOS 82 07/15/2015 0841   ALKPHOS 81 07/15/2014 0833   AST 21 10/29/2015 0800   AST 25 07/15/2015 0841   AST 30 07/15/2014 0833   ALT 16 10/29/2015 0800   ALT 23 07/15/2015 0841   ALT 30 07/15/2014 0833   BILITOT 0.3 10/29/2015 0800   BILITOT 0.51 07/15/2015 0841   BILITOT 0.6 01/13/2015 0831   BILITOT 0.70 07/15/2014 0833         Impression and Plan: Sarah Garrett is 20. She's got a respiratory and family history is as was-year-old white female with hemachromatosis. She has a double mutation. However, she really hadn't had no problems with her iron being high. She was last phlebotomized back in June 2015.  I will see what her iron studies show.   We will plan to get her back in another 6 months.  [name], MD 6/15/201711:24 AM

## 2016-01-14 ENCOUNTER — Telehealth: Payer: Self-pay | Admitting: *Deleted

## 2016-01-14 NOTE — Telephone Encounter (Addendum)
Patient aware of results.   ----- Message from [name], MD sent at 01/13/2016  5:14 PM EDT ----- Call - iron level is still ok!!!  NO need for phlebotomy!!!  pete

## 2016-03-22 ENCOUNTER — Ambulatory Visit: Payer: BLUE CROSS/BLUE SHIELD | Admitting: Family Medicine

## 2016-03-27 ENCOUNTER — Other Ambulatory Visit: Payer: BLUE CROSS/BLUE SHIELD

## 2016-03-27 DIAGNOSIS — E039 Hypothyroidism, unspecified: Secondary | ICD-10-CM

## 2016-03-27 DIAGNOSIS — E559 Vitamin D deficiency, unspecified: Secondary | ICD-10-CM

## 2016-03-27 DIAGNOSIS — I10 Essential (primary) hypertension: Secondary | ICD-10-CM | POA: Diagnosis not present

## 2016-03-27 DIAGNOSIS — K219 Gastro-esophageal reflux disease without esophagitis: Secondary | ICD-10-CM | POA: Diagnosis not present

## 2016-03-27 DIAGNOSIS — E785 Hyperlipidemia, unspecified: Secondary | ICD-10-CM

## 2016-03-28 ENCOUNTER — Ambulatory Visit (INDEPENDENT_AMBULATORY_CARE_PROVIDER_SITE_OTHER): Payer: BLUE CROSS/BLUE SHIELD | Admitting: Family Medicine

## 2016-03-28 ENCOUNTER — Encounter: Payer: Self-pay | Admitting: Family Medicine

## 2016-03-28 VITALS — BP 113/75 | HR 86 | Temp 97.6°F | Ht 63.0 in | Wt 204.0 lb

## 2016-03-28 DIAGNOSIS — E559 Vitamin D deficiency, unspecified: Secondary | ICD-10-CM | POA: Diagnosis not present

## 2016-03-28 DIAGNOSIS — I10 Essential (primary) hypertension: Secondary | ICD-10-CM

## 2016-03-28 DIAGNOSIS — K219 Gastro-esophageal reflux disease without esophagitis: Secondary | ICD-10-CM | POA: Diagnosis not present

## 2016-03-28 DIAGNOSIS — E039 Hypothyroidism, unspecified: Secondary | ICD-10-CM

## 2016-03-28 DIAGNOSIS — M5441 Lumbago with sciatica, right side: Secondary | ICD-10-CM | POA: Diagnosis not present

## 2016-03-28 DIAGNOSIS — C4361 Malignant melanoma of right upper limb, including shoulder: Secondary | ICD-10-CM

## 2016-03-28 DIAGNOSIS — E785 Hyperlipidemia, unspecified: Secondary | ICD-10-CM | POA: Diagnosis not present

## 2016-03-28 DIAGNOSIS — J9801 Acute bronchospasm: Secondary | ICD-10-CM | POA: Diagnosis not present

## 2016-03-28 DIAGNOSIS — J4520 Mild intermittent asthma, uncomplicated: Secondary | ICD-10-CM | POA: Diagnosis not present

## 2016-03-28 DIAGNOSIS — R05 Cough: Secondary | ICD-10-CM

## 2016-03-28 DIAGNOSIS — R059 Cough, unspecified: Secondary | ICD-10-CM

## 2016-03-28 LAB — BMP8+EGFR
BUN/Creatinine Ratio: 13 (ref 9–23)
BUN: 10 mg/dL (ref 6–24)
CALCIUM: 9.4 mg/dL (ref 8.7–10.2)
CO2: 25 mmol/L (ref 18–29)
Chloride: 103 mmol/L (ref 96–106)
Creatinine, Ser: 0.78 mg/dL (ref 0.57–1.00)
GFR calc Af Amer: 98 mL/min/{1.73_m2} (ref >59)
GFR calc non Af Amer: 85 mL/min/{1.73_m2} (ref >59)
GLUCOSE: 93 mg/dL (ref 65–99)
Potassium: 3.6 mmol/L (ref 3.5–5.2)
SODIUM: 145 mmol/L — AB (ref 134–144)

## 2016-03-28 LAB — NMR, LIPOPROFILE
CHOLESTEROL: 136 mg/dL (ref 100–199)
HDL CHOLESTEROL BY NMR: 41 mg/dL (ref >39)
HDL Particle Number: 32.7 umol/L (ref >=30.5)
LDL PARTICLE NUMBER: 941 nmol/L (ref <1000)
LDL Size: 20.2 nm (ref >20.5)
LDL-C: 57 mg/dL (ref 0–99)
LP-IR Score: 90 — ABNORMAL HIGH (ref <=45)
Small LDL Particle Number: 534 nmol/L — ABNORMAL HIGH (ref <=527)
TRIGLYCERIDES BY NMR: 189 mg/dL — AB (ref 0–149)

## 2016-03-28 LAB — CBC WITH DIFFERENTIAL/PLATELET
BASOS: 1 %
Basophils Absolute: 0 10*3/uL (ref 0.0–0.2)
EOS (ABSOLUTE): 0.5 10*3/uL — ABNORMAL HIGH (ref 0.0–0.4)
EOS: 7 %
HEMATOCRIT: 42.9 % (ref 34.0–46.6)
HEMOGLOBIN: 14.5 g/dL (ref 11.1–15.9)
IMMATURE GRANS (ABS): 0 10*3/uL (ref 0.0–0.1)
IMMATURE GRANULOCYTES: 0 %
LYMPHS: 35 %
Lymphocytes Absolute: 2.4 10*3/uL (ref 0.7–3.1)
MCH: 31 pg (ref 26.6–33.0)
MCHC: 33.8 g/dL (ref 31.5–35.7)
MCV: 92 fL (ref 79–97)
MONOCYTES: 5 %
MONOS ABS: 0.4 10*3/uL (ref 0.1–0.9)
NEUTROS PCT: 52 %
Neutrophils Absolute: 3.6 10*3/uL (ref 1.4–7.0)
Platelets: 166 10*3/uL (ref 150–379)
RBC: 4.68 x10E6/uL (ref 3.77–5.28)
RDW: 14.7 % (ref 12.3–15.4)
WBC: 6.9 10*3/uL (ref 3.4–10.8)

## 2016-03-28 LAB — HEPATIC FUNCTION PANEL
ALK PHOS: 84 IU/L (ref 39–117)
ALT: 24 IU/L (ref 0–32)
AST: 25 IU/L (ref 0–40)
Albumin: 4.3 g/dL (ref 3.5–5.5)
BILIRUBIN TOTAL: 0.4 mg/dL (ref 0.0–1.2)
BILIRUBIN, DIRECT: 0.11 mg/dL (ref 0.00–0.40)
Total Protein: 6.7 g/dL (ref 6.0–8.5)

## 2016-03-28 LAB — THYROID PANEL WITH TSH
Free Thyroxine Index: 2 (ref 1.2–4.9)
T3 UPTAKE RATIO: 25 % (ref 24–39)
T4, Total: 8.1 ug/dL (ref 4.5–12.0)
TSH: 1.88 u[IU]/mL (ref 0.450–4.500)

## 2016-03-28 LAB — VITAMIN D 25 HYDROXY (VIT D DEFICIENCY, FRACTURES): Vit D, 25-Hydroxy: 27.5 ng/mL — ABNORMAL LOW (ref 30.0–100.0)

## 2016-03-28 MED ORDER — PREDNISONE 10 MG PO TABS: ORAL_TABLET | ORAL | 0 refills | Status: DC

## 2016-03-28 MED ORDER — METHYLPREDNISOLONE ACETATE 80 MG/ML IJ SUSP
60.0000 mg | Freq: Once | INTRAMUSCULAR | Status: AC
  Administered 2016-03-28: 60 mg via INTRAMUSCULAR

## 2016-03-28 MED ORDER — FLUTICASONE FUROATE-VILANTEROL 200-25 MCG/INH IN AEPB: 1.0000 | INHALATION_SPRAY | Freq: Every day | RESPIRATORY_TRACT | 3 refills | Status: DC

## 2016-03-28 NOTE — Progress Notes (Signed)
Subjective:    Patient ID: Sarah Garrett, female    DOB: 1961/05/13, 55 y.o.   MRN: [id number]  HPI Pt here for follow up and management of chronic medical problems which includes hypothyroid, hypertension and hyperlipidemia. She is taking medications regularly.The patient today does complain of a dry cough and chest tightness for about 1 month. She's had lab work done recently and this would be reviewed with her during the visit today. All of the lab numbers were good including her cholesterol numbers her thyroid tests for CBC with a hemoglobin at 14.5 and blood sugar with a good creatinine. The vitamin D level however was low. Liver function tests were normal. She continues to follow-up with the hematologist for her hemachromatosis. She also follows up with the dermatologist because of the melanoma of her upper arm on the right side. The patient has been coughing as mentioned above for about 1 month. She feels like it may be getting better but it just will not go away. She is due to get her pelvic exam and mammogram soon and we'll make sure that we get a copy of that report. She had a follow-up visit for her melanoma and everything was clear and she is hoping that as of next Monday she will not have to go but once  yearly. The patient denies any chest pain or shortness of breath other than the cough that she has had. She denies any trouble with her GI tract including nausea vomiting heartburn indigestion blood in the stool or black tarry bowel movements. She is passing her water without problems. Also due to get her eye exam and stays up-to-date on her eye exams. She promises to take her vitamin D3 more regularly. She is taking 2000 units daily.      Patient Active Problem List   Diagnosis Date Noted  . Contact dermatitis 03/24/2013  . Asthma 03/24/2013  . Melanoma of upper arm, right. T1a., History of 09/07/2011  . Hemochromatosis 07/07/2011  . Hypothyroidism 02/17/2008  . Hyperlipidemia  10/11/2007  . ANEMIA-NOS 10/11/2007  . Essential hypertension 10/11/2007  . ALLERGIC RHINITIS 10/11/2007  . BRONCHITIS, CHRONIC 10/11/2007  . GERD 10/11/2007  . ADENOIDECTOMY, HX OF 10/11/2007   Outpatient Encounter Prescriptions as of 03/28/2016  Medication Sig  . albuterol (PROVENTIL HFA;VENTOLIN HFA) 108 (90 Base) MCG/ACT inhaler Inhale 2 puffs into the lungs every 6 (six) hours as needed.  . cetirizine (ZYRTEC) 10 MG tablet Take 10 mg by mouth daily.    Marland Kitchen esomeprazole (NEXIUM) 40 MG capsule Take 1 capsule (40 mg total) by mouth as needed.  . Fluticasone-Salmeterol (ADVAIR DISKUS) 100-50 MCG/DOSE AEPB Inhale 1 puff BID  . furosemide (LASIX) 40 MG tablet TAKE 1 TABLET EVERY DAY AS NEEDED  . hyoscyamine (HYOMAX-SL) 0.125 MG SL tablet Place 1 tablet (0.125 mg total) under the tongue as needed. Place 1-2 tablet under the tongue until it dissolves as needed  . levothyroxine (SYNTHROID, LEVOTHROID) 125 MCG tablet TAKE A 1/2 TABLET ON MON, WED, FRI AND 1 TABLET ALL OTHER DAYS (Patient taking differently: 125 mcg everyday except Monday)  . losartan-hydrochlorothiazide (HYZAAR) 100-12.5 MG tablet TAKE 1 TABLET BY MOUTH DAILY. AS DIRECTED  . montelukast (SINGULAIR) 10 MG tablet TAKE 1 TABLET (10 MG TOTAL) BY MOUTH AT BEDTIME.  . rosuvastatin (CRESTOR) 20 MG tablet TAKE 1 TABLET (20 MG TOTAL) BY MOUTH DAILY.  . [DISCONTINUED] furosemide (LASIX) 40 MG tablet TAKE 1 TABLET EVERY DAY AS NEEDED   No facility-administered encounter  medications on file as of 03/28/2016.      Review of Systems  Constitutional: Negative.   HENT: Negative.   Eyes: Negative.   Respiratory: Positive for cough (x 1 month - dry) and chest tightness (with cough).   Cardiovascular: Negative.   Gastrointestinal: Negative.   Endocrine: Negative.   Genitourinary: Negative.   Musculoskeletal: Negative.   Skin: Negative.   Allergic/Immunologic: Negative.   Neurological: Negative.   Hematological: Negative.     Psychiatric/Behavioral: Negative.        Objective:   Physical Exam  Constitutional: She is oriented to person, place, and time. She appears well-developed and well-nourished. No distress.  Pleasant and alert with frequent dry cough  HENT:  Head: Normocephalic and atraumatic.  Right Ear: External ear normal.  Left Ear: External ear normal.  Nose: Nose normal.  Mouth/Throat: Oropharynx is clear and moist.  Eyes: Conjunctivae and EOM are normal. Pupils are equal, round, and reactive to light. Right eye exhibits no discharge. Left eye exhibits no discharge. No scleral icterus.  Neck: Normal range of motion. Neck supple. No thyromegaly present.  Neck without bruits thyromegaly or adenopathy  Cardiovascular: Normal rate, regular rhythm, normal heart sounds and intact distal pulses.   No murmur heard. Pulmonary/Chest: Effort normal. No respiratory distress. She has no wheezes. She has no rales.  Dry cough with slightly decreased air movement  Abdominal: Soft. Bowel sounds are normal. She exhibits no mass. There is no tenderness. There is no rebound and no guarding.  Musculoskeletal: Normal range of motion. She exhibits no edema.  Lymphadenopathy:    She has no cervical adenopathy.  Neurological: She is alert and oriented to person, place, and time. She has normal reflexes. No cranial nerve deficit.  Skin: Skin is warm and dry. No rash noted.  Psychiatric: She has a normal mood and affect. Her behavior is normal. Judgment and thought content normal.  Nursing note and vitals reviewed.   BP (!) 134/96 (BP Location: Left Arm)   Pulse 86   Temp 97.6 F (36.4 C) (Oral)   Ht 5\' 3"  (1.6 m)   Wt 204 lb (92.5 kg)   LMP 11/29/2011   BMI 36.14 kg/m        Assessment & Plan:  1. Essential hypertension -The blood pressure is good today and she will continue with her current treatment  2. Hyperlipidemia -All cholesterol numbers with advanced lipid testing were excellent except for  triglycerides and she will place more emphasis on diet and exercise.  3. Hypothyroidism, unspecified hypothyroidism type -She will continue with current thyroid treatment  4. Gastroesophageal reflux disease, esophagitis presence not specified -Her GERD symptoms were diminished today and she will continue with her current treatment  5. Vitamin D deficiency -She promises to take her vitamin D3 2000 units more regularly  6. Melanoma of upper arm, right ([place]) -Continue to follow-up with dermatology  7. Cough -Prednisone 10 Taper 3 days -Use Advair regularly twice daily - methylPREDNISolone acetate (DEPO-MEDROL) injection 60 mg; Inject 0.75 mLs (60 mg total) into the muscle once.  8. Bronchospasm -Take Mucinex maximum strength 1 twice daily with a large glass of water  9. Asthma, mild intermittent, uncomplicated -Continue with Advair and as needed albuterol  10. Right-sided low back pain with right-sided sciatica -Prednisone taper and follow up with orthopedic surgeon if no better  Meds ordered this encounter  Medications  . DISCONTD: predniSONE (DELTASONE) 10 MG tablet    Sig: Take 1 tab QID x 2 days,  then 1 tab TID x 2 days, then 1 tab BID x 2 days, then 1 tab QD x 2 days.    Dispense:  20 tablet    Refill:  0  . methylPREDNISolone acetate (DEPO-MEDROL) injection 60 mg  . fluticasone furoate-vilanterol (BREO ELLIPTA) 200-25 MCG/INH AEPB    Sig: Inhale 1 puff into the lungs daily.    Dispense:  28 each    Refill:  3  . predniSONE (DELTASONE) 10 MG tablet    Sig: Take 1 tab QID x 3 days, then 1 tab TID x 3 days, then 1 tab BID x 3 days, then 1 tab QD x 3 days.    Dispense:  30 tablet    Refill:  0   Patient Instructions  Continue current medications. Continue good therapeutic lifestyle changes which include good diet and exercise. Fall precautions discussed with patient. If an FOBT was given today- please return it to our front desk. If you are over 45 years old - you may  need Prevnar 44 or the adult Pneumonia vaccine.  After your visit with [place] today you will receive a survey in the mail or online from [employer] regarding your care with [place]. Please take a moment to fill this out. Your feedback is very important to [place] as you can help [place] better understand your patient needs as well as improve your experience and satisfaction. WE CARE ABOUT YOU!!!   The patient should take her prednisone as directed She should continue to use her Advair regularly and rinse mouth after using She should call us in 2 weeks regarding her progress with her cough and breathing and tightness She should also let us know about her right-sided back pain radiating down her right leg She may need an appointment with [facility] orthopedics, Dr. Nelva Bush   Arrie Senate MD

## 2016-03-28 NOTE — Patient Instructions (Addendum)
Continue current medications. Continue good therapeutic lifestyle changes which include good diet and exercise. Fall precautions discussed with patient. If an FOBT was given today- please return it to our front desk. If you are over 57 years old - you may need Prevnar 42 or the adult Pneumonia vaccine.  After your visit with [place] today you will receive a survey in the mail or online from [employer] regarding your care with [place]. Please take a moment to fill this out. Your feedback is very important to [place] as you can help [place] better understand your patient needs as well as improve your experience and satisfaction. WE CARE ABOUT YOU!!!   The patient should take her prednisone as directed She should continue to use her Advair regularly and rinse mouth after using She should call us in 2 weeks regarding her progress with her cough and breathing and tightness She should also let us know about her right-sided back pain radiating down her right leg She may need an appointment with [facility] orthopedics, Dr. Nelva Bush

## 2016-03-31 ENCOUNTER — Telehealth: Payer: Self-pay | Admitting: Family Medicine

## 2016-03-31 NOTE — Telephone Encounter (Signed)
Reviewed with pt

## 2016-04-14 ENCOUNTER — Telehealth: Payer: Self-pay | Admitting: Family Medicine

## 2016-04-18 NOTE — Telephone Encounter (Signed)
Pt states she is feeling some better - the BREO may be causing some slight dizziness - but she is almost finished with it.

## 2016-05-16 DIAGNOSIS — Z23 Encounter for immunization: Secondary | ICD-10-CM | POA: Diagnosis not present

## 2016-06-13 DIAGNOSIS — D1801 Hemangioma of skin and subcutaneous tissue: Secondary | ICD-10-CM | POA: Diagnosis not present

## 2016-06-13 DIAGNOSIS — Z85820 Personal history of malignant melanoma of skin: Secondary | ICD-10-CM | POA: Diagnosis not present

## 2016-06-13 DIAGNOSIS — D235 Other benign neoplasm of skin of trunk: Secondary | ICD-10-CM | POA: Diagnosis not present

## 2016-06-13 DIAGNOSIS — L821 Other seborrheic keratosis: Secondary | ICD-10-CM | POA: Diagnosis not present

## 2016-06-16 ENCOUNTER — Encounter: Payer: Self-pay | Admitting: Family

## 2016-06-16 ENCOUNTER — Ambulatory Visit (INDEPENDENT_AMBULATORY_CARE_PROVIDER_SITE_OTHER): Payer: BLUE CROSS/BLUE SHIELD | Admitting: Family

## 2016-06-16 VITALS — BP 141/96 | HR 110 | Temp 100.3°F | Ht 63.0 in | Wt 208.4 lb

## 2016-06-16 DIAGNOSIS — J209 Acute bronchitis, unspecified: Secondary | ICD-10-CM

## 2016-06-16 MED ORDER — DOXYCYCLINE HYCLATE 100 MG PO TABS: 100.0000 mg | ORAL_TABLET | Freq: Two times a day (BID) | ORAL | 0 refills | Status: DC

## 2016-06-16 MED ORDER — PREDNISONE 10 MG (21) PO TBPK: ORAL_TABLET | ORAL | 0 refills | Status: DC

## 2016-06-16 NOTE — Progress Notes (Signed)
   Subjective:    Patient ID: Sarah Garrett, female    DOB: 04/30/1961, 55 y.o.   MRN: [id number]  Cough  This is a new problem. The current episode started 1 to 4 weeks ago. The problem has been waxing and waning. The problem occurs every few minutes. The cough is non-productive. Associated symptoms include chills, a fever, headaches, myalgias, nasal congestion, postnasal drip, rhinorrhea, a sore throat, shortness of breath and wheezing. Pertinent negatives include no ear congestion or ear pain. The symptoms are aggravated by lying down. She has tried rest and OTC cough suppressant for the symptoms. The treatment provided mild relief. Her past medical history is significant for asthma.      Review of Systems  Constitutional: Positive for chills and fever.  HENT: Positive for postnasal drip, rhinorrhea and sore throat. Negative for ear pain.   Respiratory: Positive for cough, shortness of breath and wheezing.   Musculoskeletal: Positive for myalgias.  Neurological: Positive for headaches.  All other systems reviewed and are negative.      Objective:   Physical Exam  Constitutional: She is oriented to person, place, and time. She appears well-developed and well-nourished. No distress.  HENT:  Head: Normocephalic and atraumatic.  Right Ear: External ear normal.  Left Ear: External ear normal.  Nose: Mucosal edema and rhinorrhea present.  Mouth/Throat: Posterior oropharyngeal erythema present.  Eyes: Pupils are equal, round, and reactive to light.  Neck: Normal range of motion. Neck supple. No thyromegaly present.  Cardiovascular: Normal rate, regular rhythm, normal heart sounds and intact distal pulses.   No murmur heard. Pulmonary/Chest: Effort normal and breath sounds normal. No respiratory distress. She has no wheezes.  Constant non-productive cough   Abdominal: Soft. Bowel sounds are normal. She exhibits no distension. There is no tenderness.  Musculoskeletal: Normal range  of motion. She exhibits no edema or tenderness.  Neurological: She is alert and oriented to person, place, and time.  Skin: Skin is warm and dry.  Psychiatric: She has a normal mood and affect. Her behavior is normal. Judgment and thought content normal.  Vitals reviewed.     BP (!) 141/96   Pulse (!) 110   Temp 100.3 F (37.9 C) (Oral)   Ht 5\' 3"  (1.6 m)   Wt 208 lb 6.4 oz (94.5 kg)   LMP 11/29/2011   BMI 36.92 kg/m      Assessment & Plan:  1. Acute bronchitis, unspecified organism -- Take meds as prescribed - Use a cool mist humidifier  -Use saline nose sprays frequently -Saline irrigations of the nose can be very helpful if done frequently.  * 4X daily for 1 week*  * Use of a nettie pot can be helpful with this. Follow directions with this* -Force fluids -For any cough or congestion  Use plain Mucinex- regular strength or max strength is fine   * Children- consult with Pharmacist for dosing -For fever or aces or pains- take tylenol or ibuprofen appropriate for age and weight.  * for fevers greater than 101 orally you may alternate ibuprofen and tylenol every  3 hours. -Throat lozenges if help -New toothbrush in 3 days - doxycycline (VIBRA-TABS) 100 MG tablet; Take 1 tablet (100 mg total) by mouth 2 (two) times daily.  Dispense: 20 tablet; Refill: 0 - predniSONE (STERAPRED UNI-PAK 21 TAB) 10 MG (21) TBPK tablet; Use as directed  Dispense: 21 tablet; Refill: 0  Evelina Dun, FNP

## 2016-06-16 NOTE — Patient Instructions (Signed)

## 2016-07-05 ENCOUNTER — Other Ambulatory Visit: Payer: Self-pay | Admitting: Family Medicine

## 2016-07-19 ENCOUNTER — Ambulatory Visit (HOSPITAL_BASED_OUTPATIENT_CLINIC_OR_DEPARTMENT_OTHER): Payer: BLUE CROSS/BLUE SHIELD | Admitting: Hematology & Oncology

## 2016-07-19 ENCOUNTER — Other Ambulatory Visit (HOSPITAL_BASED_OUTPATIENT_CLINIC_OR_DEPARTMENT_OTHER): Payer: BLUE CROSS/BLUE SHIELD

## 2016-07-19 DIAGNOSIS — C4361 Malignant melanoma of right upper limb, including shoulder: Secondary | ICD-10-CM

## 2016-07-19 DIAGNOSIS — Z85820 Personal history of malignant melanoma of skin: Secondary | ICD-10-CM | POA: Diagnosis not present

## 2016-07-19 LAB — IRON AND TIBC
%SAT: 27 % (ref 21–57)
Iron: 100 ug/dL (ref 41–142)
TIBC: 373 ug/dL (ref 236–444)
UIBC: 273 ug/dL (ref 120–384)

## 2016-07-19 LAB — COMPREHENSIVE METABOLIC PANEL
ALT: 25 U/L (ref 0–55)
ANION GAP: 11 meq/L (ref 3–11)
AST: 26 U/L (ref 5–34)
Albumin: 4.4 g/dL (ref 3.5–5.0)
Alkaline Phosphatase: 85 U/L (ref 40–150)
BUN: 11 mg/dL (ref 7.0–26.0)
CALCIUM: 10.1 mg/dL (ref 8.4–10.4)
CHLORIDE: 102 meq/L (ref 98–109)
CO2: 27 mEq/L (ref 22–29)
Creatinine: 0.8 mg/dL (ref 0.6–1.1)
EGFR: 77 mL/min/{1.73_m2} — ABNORMAL LOW (ref >90)
Glucose: 102 mg/dl (ref 70–140)
POTASSIUM: 3.2 meq/L — AB (ref 3.5–5.1)
Sodium: 140 mEq/L (ref 136–145)
Total Bilirubin: 0.81 mg/dL (ref 0.20–1.20)
Total Protein: 8.4 g/dL — ABNORMAL HIGH (ref 6.4–8.3)

## 2016-07-19 LAB — CBC WITH DIFFERENTIAL (CANCER CENTER ONLY)
BASO#: 0.1 10*3/uL (ref 0.0–0.2)
BASO%: 0.6 % (ref 0.0–2.0)
EOS%: 5.3 % (ref 0.0–7.0)
Eosinophils Absolute: 0.4 10*3/uL (ref 0.0–0.5)
HCT: 44.1 % (ref 34.8–46.6)
HGB: 15.8 g/dL (ref 11.6–15.9)
LYMPH#: 2.5 10*3/uL (ref 0.9–3.3)
LYMPH%: 32.9 % (ref 14.0–48.0)
MCH: 31.8 pg (ref 26.0–34.0)
MCHC: 35.8 g/dL (ref 32.0–36.0)
MCV: 89 fL (ref 81–101)
MONO#: 0.4 10*3/uL (ref 0.1–0.9)
MONO%: 5.4 % (ref 0.0–13.0)
NEUT#: 4.3 10*3/uL (ref 1.5–6.5)
NEUT%: 55.8 % (ref 39.6–80.0)
PLATELETS: 203 10*3/uL (ref 145–400)
RBC: 4.97 10*6/uL (ref 3.70–5.32)
RDW: 13.7 % (ref 11.1–15.7)
WBC: 7.7 10*3/uL (ref 3.9–10.0)

## 2016-07-19 LAB — FERRITIN: FERRITIN: 100 ng/mL (ref 9–269)

## 2016-07-19 NOTE — Progress Notes (Signed)
Hematology and Oncology Follow Up Visit  Sarah Garrett [id number] [birth year] 56 y.o. 07/19/2016   Principle Diagnosis:  1. 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 2. 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  Current Therapy:    Observation     Interim History:  [name] is back for followup. Her daughter is doing much better. Sarah Garrett graduated from college this past Saturday. Sarah Garrett now is looking for work. It does not, Sarah Garrett will have any trouble finding a job.   Sarah Garrett is working. 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 Sarah Garrett's been there now for almost 40 years. I'm sure there'll be a celebration next year.   Sarah Garrett saw the dermatologist the same day that I did back in November. Sarah Garrett had a good checkup.   Sarah Garrett has asthma. This really limits her activities outside.   Her last iron studies done back in June showed a ferritin of 84 with an iron saturation of 27%.   Sarah Garrett's had no fever. Sarah Garrett did have a bout of bronchitis and laryngitis back in November. Sarah Garrett finally got over this period   Sarah Garrett's had no change in bowel or bladder habits.  Overall, her performance status is ECOG 0.   Medications:  Current Outpatient Prescriptions:  .  albuterol (PROVENTIL HFA;VENTOLIN HFA) 108 (90 Base) MCG/ACT inhaler, Inhale 2 puffs into the lungs every 6 (six) hours as needed., Disp: 18 g, Rfl: 6 .  cetirizine (ZYRTEC) 10 MG tablet, Take 10 mg by mouth daily.  , Disp: , Rfl:  .  esomeprazole (NEXIUM) 40 MG capsule, Take 1 capsule (40 mg total) by mouth as needed., Disp: 30 capsule, Rfl: 6 .  Fluticasone-Salmeterol (ADVAIR DISKUS) 100-50 MCG/DOSE AEPB, Inhale 1 puff BID, Disp: 60 each, Rfl: 6 .  furosemide (LASIX) 40 MG tablet, TAKE 1 TABLET EVERY DAY AS NEEDED, Disp: 30 tablet, Rfl: 6 .  hyoscyamine (HYOMAX-SL) 0.125 MG SL tablet, Place 1 tablet (0.125 mg total) under the tongue as needed. Place 1-2 tablet under the tongue until it dissolves as needed, Disp: 30 tablet, Rfl: 2 .   levothyroxine (SYNTHROID, LEVOTHROID) 125 MCG tablet, TAKE A 1/2 TABLET ON MON, WED, FRI AND 1 TABLET ALL OTHER DAYS, Disp: 30 tablet, Rfl: 6 .  losartan-hydrochlorothiazide (HYZAAR) 100-12.5 MG tablet, TAKE 1 TABLET BY MOUTH DAILY. AS DIRECTED, Disp: 30 tablet, Rfl: 6 .  montelukast (SINGULAIR) 10 MG tablet, TAKE 1 TABLET (10 MG TOTAL) BY MOUTH AT BEDTIME., Disp: 30 tablet, Rfl: 6 .  rosuvastatin (CRESTOR) 20 MG tablet, TAKE 1 TABLET (20 MG TOTAL) BY MOUTH DAILY., Disp: 30 tablet, Rfl: 6  Allergies:  Allergies  Allergen Reactions  . Ace Inhibitors     Per [id number]  . Actos [Pioglitazone Hydrochloride]     Per pt: unknown  . Aspirin Swelling    Swelling of mouth and throat  . Ciprofloxacin   . Crestor [Rosuvastatin Calcium]     Per pt: unknown  . Pneumococcal Vaccines     Red rash  . Sulfonamide Derivatives     Per pt: unknown    Past Medical History, Surgical history, Social history, and Family History were reviewed and updated.  Review of Systems: As above  Physical Exam:  weight is 204 lb 6.4 oz (92.7 kg). Her oral temperature is 98.3 F (36.8 C). Her blood pressure is 132/86 and her pulse is 93. Her respiration is 20.   Well-developed and well-nourished white female. Head and neck exam shows no  ocular or oral lesions. Sarah Garrett has no palpable cervical or supraclavicular lymph nodes. Lungs are clear. Cardiac exam regular rate and rhythm with no murmurs rubs or bruits. Abdomen soft. Sarah Garrett has good bowel sounds. There is no fluid wave. There is no palpable liver or spleen tip. Back exam shows no tenderness over the spine ribs or hips. Extremities shows no clubbing cyanosis or edema. Skin exam shows no  suspicious skin lesions. Neurological exam is nonfocal.  Lab Results  Component Value Date   WBC 7.7 07/19/2016   HGB 15.8 07/19/2016   HCT 44.1 07/19/2016   MCV 89 07/19/2016   PLT 203 07/19/2016     Chemistry      Component Value Date/Time   NA 145 (H) 03/27/2016 1133   NA  141 01/13/2016 1023   K 3.6 03/27/2016 1133   K 3.2 (L) 01/13/2016 1023   CL 103 03/27/2016 1133   CL 101 07/15/2014 0833   CO2 25 03/27/2016 1133   CO2 31 (H) 01/13/2016 1023   BUN 10 03/27/2016 1133   BUN 10.9 01/13/2016 1023   CREATININE 0.78 03/27/2016 1133   CREATININE 0.9 01/13/2016 1023      Component Value Date/Time   CALCIUM 9.4 03/27/2016 1133   CALCIUM 10.2 01/13/2016 1023   ALKPHOS 84 03/27/2016 1133   ALKPHOS 79 01/13/2016 1023   AST 25 03/27/2016 1133   AST 23 01/13/2016 1023   ALT 24 03/27/2016 1133   ALT 23 01/13/2016 1023   BILITOT 0.4 03/27/2016 1133   BILITOT 0.73 01/13/2016 1023         Impression and Plan: Sarah Garrett is 42. Sarah Garrett's got a respiratory and family history is as was-year-old white female with hemachromatosis. Sarah Garrett has a double mutation. However, Sarah Garrett really hadn't had no problems with her iron being high. Sarah Garrett was last phlebotomized back in June 2015.  I will see what her iron studies show.   We will plan to get her back in another 6 months.  [name], MD 12/20/20179:33 AM

## 2016-07-21 ENCOUNTER — Telehealth: Payer: Self-pay

## 2016-07-21 NOTE — Telephone Encounter (Addendum)
-----   Message from [name], MD sent at 07/19/2016  3:11 PM EST ----- Call - ferritin is too high!!  Need to come in for 1 phlebotomy!!  pete  Above message left on personalized VM for pt to contact our office to schedule phlebotomy. dph

## 2016-07-29 ENCOUNTER — Other Ambulatory Visit: Payer: Self-pay | Admitting: Family Medicine

## 2016-08-14 ENCOUNTER — Ambulatory Visit (INDEPENDENT_AMBULATORY_CARE_PROVIDER_SITE_OTHER): Payer: BLUE CROSS/BLUE SHIELD | Admitting: Family Medicine

## 2016-08-14 ENCOUNTER — Encounter: Payer: Self-pay | Admitting: Family Medicine

## 2016-08-14 VITALS — BP 116/76 | HR 100 | Temp 100.9°F | Ht 63.0 in | Wt 202.0 lb

## 2016-08-14 DIAGNOSIS — J029 Acute pharyngitis, unspecified: Secondary | ICD-10-CM

## 2016-08-14 LAB — VERITOR FLU A/B WAIVED
Influenza A: NEGATIVE
Influenza B: NEGATIVE

## 2016-08-14 MED ORDER — AZITHROMYCIN 250 MG PO TABS: ORAL_TABLET | ORAL | 0 refills | Status: DC

## 2016-08-14 NOTE — Progress Notes (Signed)
BP 116/76   Pulse 100   Temp (!) 100.9 F (38.3 C) (Oral)   Ht 5\' 3"  (1.6 m)   Wt 202 lb (91.6 kg)   LMP 11/29/2011   BMI 35.78 kg/m    Subjective:    Patient ID: Sarah Garrett, female    DOB: 1960-11-12, 56 y.o.   MRN: [id number]  HPI: Sarah Garrett is a 56 y.o. female presenting on 08/14/2016 for Cough (non productive, OTC mucinex, maybe off & on some for 2 weeks but really worse starting yesterday); Fever (off & on); and Sore Throat (feels like she's had blisters on throat some)   HPI Cough and sore throat Patient has been having cough and sore throat that's been going on for the past 2 weeks. She says she's also had some fevers and sweats intermittently off and on. She says it's worsened over the past few days and she feels like she's had some blisters in her throat now. Her cough has been mostly nonproductive. She denies any shortness of breath or wheezing. She has been using Mucinex which has helped some.  Relevant past medical, surgical, family and social history reviewed and updated as indicated. Interim medical history since our last visit reviewed. Allergies and medications reviewed and updated.  Review of Systems  Constitutional: Positive for fever. Negative for chills.  HENT: Positive for congestion, postnasal drip, rhinorrhea, sinus pressure, sneezing and sore throat. Negative for ear discharge and ear pain.   Eyes: Negative for pain, redness and visual disturbance.  Respiratory: Positive for cough. Negative for chest tightness and shortness of breath.   Cardiovascular: Negative for chest pain and leg swelling.  Genitourinary: Negative for difficulty urinating and dysuria.  Musculoskeletal: Negative for back pain and gait problem.  Skin: Negative for rash.  Neurological: Negative for light-headedness and headaches.  Psychiatric/Behavioral: Negative for agitation and behavioral problems.  All other systems reviewed and are negative.   Per HPI unless  specifically indicated above        Objective:    BP 116/76   Pulse 100   Temp (!) 100.9 F (38.3 C) (Oral)   Ht 5\' 3"  (1.6 m)   Wt 202 lb (91.6 kg)   LMP 11/29/2011   BMI 35.78 kg/m   Wt Readings from Last 3 Encounters:  08/14/16 202 lb (91.6 kg)  07/19/16 204 lb 6.4 oz (92.7 kg)  06/16/16 208 lb 6.4 oz (94.5 kg)    Physical Exam  Constitutional: She is oriented to person, place, and time. She appears well-developed and well-nourished. No distress.  HENT:  Right Ear: Tympanic membrane, external ear and ear canal normal.  Left Ear: Tympanic membrane, external ear and ear canal normal.  Nose: Mucosal edema and rhinorrhea present. No epistaxis. Right sinus exhibits no maxillary sinus tenderness and no frontal sinus tenderness. Left sinus exhibits no maxillary sinus tenderness and no frontal sinus tenderness.  Mouth/Throat: Uvula is midline and mucous membranes are normal. Posterior oropharyngeal edema and posterior oropharyngeal erythema present. No oropharyngeal exudate or tonsillar abscesses.  Eyes: Conjunctivae are normal.  Cardiovascular: Normal rate, regular rhythm, normal heart sounds and intact distal pulses.   No murmur heard. Pulmonary/Chest: Effort normal and breath sounds normal. No respiratory distress. She has no wheezes. She has no rales.  Musculoskeletal: Normal range of motion. She exhibits no edema or tenderness.  Neurological: She is alert and oriented to person, place, and time. Coordination normal.  Skin: Skin is warm and dry. No rash noted. She is  not diaphoretic.  Psychiatric: She has a normal mood and affect. Her behavior is normal.  Vitals reviewed.     Assessment & Plan:   Problem List Items Addressed This Visit    None    Visit Diagnoses    Acute pharyngitis, unspecified etiology    -  Primary   Relevant Orders   Veritor Flu A/B Waived       Follow up plan: Return if symptoms worsen or fail to improve.  Counseling provided for all of the  vaccine components Orders Placed This Encounter  Procedures  . Veritor Flu A/B [place], MD [facility] 08/14/2016, 5:01 PM

## 2016-08-18 ENCOUNTER — Other Ambulatory Visit: Payer: Self-pay | Admitting: *Deleted

## 2016-08-18 MED ORDER — AZITHROMYCIN 250 MG PO TABS: ORAL_TABLET | ORAL | 0 refills | Status: DC

## 2016-08-23 ENCOUNTER — Encounter: Payer: Self-pay | Admitting: Family Medicine

## 2016-08-23 ENCOUNTER — Ambulatory Visit (INDEPENDENT_AMBULATORY_CARE_PROVIDER_SITE_OTHER): Payer: BLUE CROSS/BLUE SHIELD | Admitting: Family Medicine

## 2016-08-23 VITALS — BP 112/69 | HR 83 | Temp 97.3°F | Ht 63.0 in | Wt 199.0 lb

## 2016-08-23 DIAGNOSIS — J4520 Mild intermittent asthma, uncomplicated: Secondary | ICD-10-CM | POA: Diagnosis not present

## 2016-08-23 DIAGNOSIS — R05 Cough: Secondary | ICD-10-CM

## 2016-08-23 DIAGNOSIS — E559 Vitamin D deficiency, unspecified: Secondary | ICD-10-CM | POA: Diagnosis not present

## 2016-08-23 DIAGNOSIS — I10 Essential (primary) hypertension: Secondary | ICD-10-CM | POA: Diagnosis not present

## 2016-08-23 DIAGNOSIS — K219 Gastro-esophageal reflux disease without esophagitis: Secondary | ICD-10-CM | POA: Diagnosis not present

## 2016-08-23 DIAGNOSIS — J9801 Acute bronchospasm: Secondary | ICD-10-CM | POA: Diagnosis not present

## 2016-08-23 DIAGNOSIS — C4361 Malignant melanoma of right upper limb, including shoulder: Secondary | ICD-10-CM | POA: Diagnosis not present

## 2016-08-23 DIAGNOSIS — E7800 Pure hypercholesterolemia, unspecified: Secondary | ICD-10-CM

## 2016-08-23 DIAGNOSIS — R059 Cough, unspecified: Secondary | ICD-10-CM

## 2016-08-23 MED ORDER — PREDNISONE 10 MG PO TABS: ORAL_TABLET | ORAL | 0 refills | Status: DC

## 2016-08-23 MED ORDER — METHYLPREDNISOLONE ACETATE 80 MG/ML IJ SUSP
60.0000 mg | Freq: Once | INTRAMUSCULAR | Status: AC
  Administered 2016-08-23: 60 mg via INTRAMUSCULAR

## 2016-08-23 NOTE — Patient Instructions (Addendum)
Continue current medications. Continue good therapeutic lifestyle changes which include good diet and exercise. Fall precautions discussed with patient. If an FOBT was given today- please return it to our front desk. If you are over 58 years old - you may need Prevnar 24 or the adult Pneumonia vaccine.  **Flu shots are available--- please call and schedule a FLU-CLINIC appointment**  After your visit with [place] today you will receive a survey in the mail or online from [employer] regarding your care with [place]. Please take a moment to fill this out. Your feedback is very important to [place] as you can help [place] better understand your patient needs as well as improve your experience and satisfaction. WE CARE ABOUT YOU!!!   For the short term use the nebulizer treatment instead of the handheld albuterol. This will be more effective for your breathing Consider getting a cool mist humidifier Continue to take Mucinex one twice daily with a large glass of water Keep the house as cool as possible Take the prednisone as directed We will call with the results of the CBC and depending on those results and the white blood cell count we will consider adding an additional antibiotic if necessary Return to clinic Saturday for fasting blood work protocol

## 2016-08-23 NOTE — Progress Notes (Signed)
Subjective:    Patient ID: Sarah Garrett, female    DOB: [birth year], 56 y.o.   MRN: [id number]  HPI Pt here for follow up and management of chronic medical problems which includes hypertension and hyperlipidemia. She is taking medication regularly.This patient has a long history of asthma and bronchospasm. She just finished a Z-Pak. She is still coughing. She is up-to-date on her pelvic exam . She is due to get a mammogram. She'll get lab work today or will return on Saturday fasting. The patient recently lost her stepfather. She's had a lot of stress in her life because of his death. She was very close to him. She's had a cough for about 3-4 weeks. She is taking a course of antibiotics but still is coughing a lot. As mentioned earlier she has a long history of asthma and bronchospasm. She denies any chest pain other than the soreness that she has had from all the coughing. She has had shortness of breath because of the coughing and the chest congestion. The congestion may be clearing a little bit but previously has had some color to it. She denies any trouble with nausea vomiting diarrhea blood in the stool or black tarry bowel movements. She is passing her water without problems.    Patient Active Problem List   Diagnosis Date Noted  . Contact dermatitis 03/24/2013  . Asthma 03/24/2013  . Melanoma of upper arm, right. T1a., History of 09/07/2011  . Hemochromatosis 07/07/2011  . Hypothyroidism 02/17/2008  . Hyperlipidemia 10/11/2007  . ANEMIA-NOS 10/11/2007  . Essential hypertension 10/11/2007  . ALLERGIC RHINITIS 10/11/2007  . BRONCHITIS, CHRONIC 10/11/2007  . GERD 10/11/2007  . ADENOIDECTOMY, HX OF 10/11/2007   Outpatient Encounter Prescriptions as of 08/23/2016  Medication Sig  . albuterol (PROVENTIL HFA;VENTOLIN HFA) 108 (90 Base) MCG/ACT inhaler Inhale 2 puffs into the lungs every 6 (six) hours as needed.  . cetirizine (ZYRTEC) 10 MG tablet Take 10 mg by mouth daily.    Marland Kitchen  esomeprazole (NEXIUM) 40 MG capsule Take 1 capsule (40 mg total) by mouth as needed.  . Fluticasone-Salmeterol (ADVAIR DISKUS) 100-50 MCG/DOSE AEPB Inhale 1 puff BID  . furosemide (LASIX) 40 MG tablet TAKE 1 TABLET EVERY DAY AS NEEDED  . hyoscyamine (HYOMAX-SL) 0.125 MG SL tablet Place 1 tablet (0.125 mg total) under the tongue as needed. Place 1-2 tablet under the tongue until it dissolves as needed  . levothyroxine (SYNTHROID, LEVOTHROID) 125 MCG tablet TAKE A 1/2 TABLET ON MON, WED, FRI AND 1 TABLET ALL OTHER DAYS  . losartan-hydrochlorothiazide (HYZAAR) 100-12.5 MG tablet TAKE 1 TABLET BY MOUTH DAILY. AS DIRECTED  . montelukast (SINGULAIR) 10 MG tablet TAKE 1 TABLET (10 MG TOTAL) BY MOUTH AT BEDTIME.  . rosuvastatin (CRESTOR) 20 MG tablet TAKE 1 TABLET (20 MG TOTAL) BY MOUTH DAILY.  . [DISCONTINUED] azithromycin (ZITHROMAX) 250 MG tablet Take 2 the first day and then one each day after.  . [DISCONTINUED] levothyroxine (SYNTHROID, LEVOTHROID) 125 MCG tablet TAKE A 1/2 TABLET ON MON, WED, FRI AND 1 TABLET ALL OTHER DAYS   No facility-administered encounter medications on file as of 08/23/2016.       Review of Systems  Constitutional: Negative.  Negative for fever.  HENT: Positive for congestion and postnasal drip (slight ).   Eyes: Negative.   Respiratory: Positive for cough, chest tightness and wheezing.   Cardiovascular: Negative.   Gastrointestinal: Negative.   Endocrine: Negative.   Genitourinary: Negative.   Musculoskeletal: Negative.   Skin:  Negative.   Allergic/Immunologic: Negative.   Neurological: Negative.   Hematological: Negative.   Psychiatric/Behavioral: Negative.        Objective:   Physical Exam  Constitutional: She is oriented to person, place, and time. She appears well-developed and well-nourished. No distress.  HENT:  Head: Normocephalic and atraumatic.  Right Ear: External ear normal.  Left Ear: External ear normal.  Mouth/Throat: Oropharynx is clear and  moist. No oropharyngeal exudate.  Nasal congestion and turbinate swelling bilaterally  Eyes: Conjunctivae and EOM are normal. Pupils are equal, round, and reactive to light. Right eye exhibits no discharge. Left eye exhibits no discharge. No scleral icterus.  Neck: Normal range of motion. Neck supple. No thyromegaly present.  No anterior cervical adenopathy and no bruits  Cardiovascular: Normal rate, regular rhythm, normal heart sounds and intact distal pulses.   No murmur heard. Pulmonary/Chest: Effort normal and breath sounds normal. No respiratory distress. She has no wheezes. She has no rales. She exhibits no tenderness.  Dry cough with minimal congestion with coughing  Abdominal: Soft. Bowel sounds are normal. She exhibits no mass. There is no tenderness. There is no rebound and no guarding.  Musculoskeletal: Normal range of motion. She exhibits no edema.  Lymphadenopathy:    She has no cervical adenopathy.  Neurological: She is alert and oriented to person, place, and time.  Skin: Skin is warm and dry. No rash noted.  Psychiatric: She has a normal mood and affect. Her behavior is normal. Judgment and thought content normal.  Nursing note and vitals reviewed.  BP 112/69 (BP Location: Left Wrist)   Pulse 83   Temp 97.3 F (36.3 C) (Oral)   Ht 5' 3"  (1.6 m)   Wt 199 lb (90.3 kg)   LMP 11/29/2011   BMI 35.25 kg/m         Assessment & Plan:  1. Essential hypertension -The blood pressure is good today and she will continue with current treatment - BMP8+EGFR; Future - CBC with Differential/Platelet; Future - Hepatic function panel; Future  2. Pure hypercholesterolemia -Continue with current treatment and aggressive therapeutic lifestyle changes pending results of lab work - CBC with Differential/Platelet; Future - NMR, lipoprofile; Future  3. Gastroesophageal reflux disease, esophagitis presence not specified -Continue with current treatment and patient is not complaining  with this today. - CBC with Differential/Platelet; Future - Hepatic function panel; Future  4. Vitamin D deficiency -Continue with current treatment pending results of lab work - CBC with Differential/Platelet; Future - VITAMIN D 25 Hydroxy (Vit-D Deficiency, Fractures); Future  5. Cough -Take Mucinex maximum strength, plain, 1 twice daily with a large glass of water for cough and congestion -Use cool mist humidification - predniSONE (DELTASONE) 10 MG tablet; Take 1 tab QID x 2 days, then 1 tab TID x 2 days, then 1 tab BID x 2 days, then 1 tab QD x 2 days.  Dispense: 20 tablet; Refill: 0 - methylPREDNISolone acetate (DEPO-MEDROL) injection 60 mg; Inject 0.75 mLs (60 mg total) into the muscle once.  6. Bronchospasm -Use albuterol nebulizer treatment preferably over the handheld albuterol. - predniSONE (DELTASONE) 10 MG tablet; Take 1 tab QID x 2 days, then 1 tab TID x 2 days, then 1 tab BID x 2 days, then 1 tab QD x 2 days.  Dispense: 20 tablet; Refill: 0 - methylPREDNISolone acetate (DEPO-MEDROL) injection 60 mg; Inject 0.75 mLs (60 mg total) into the muscle once.  7. Melanoma of upper arm, right ([place]) -Follow-up with dermatology as planned  8. Hereditary hemochromatosis ([place]) -Follow-up with hematology as planned  9. Mild intermittent asthmatic bronchitis without complication -Continue with Advair, albuterol nebs, take prednisone as directed and await results of CBC to see if additional antibiotic may be necessary. -If the patient does not get better she may need to have a chest x-ray.  Meds ordered this encounter  Medications  . predniSONE (DELTASONE) 10 MG tablet    Sig: Take 1 tab QID x 2 days, then 1 tab TID x 2 days, then 1 tab BID x 2 days, then 1 tab QD x 2 days.    Dispense:  20 tablet    Refill:  0  . methylPREDNISolone acetate (DEPO-MEDROL) injection 60 mg   Patient Instructions  Continue current medications. Continue good therapeutic lifestyle changes which include  good diet and exercise. Fall precautions discussed with patient. If an FOBT was given today- please return it to our front desk. If you are over 29 years old - you may need Prevnar 94 or the adult Pneumonia vaccine.  **Flu shots are available--- please call and schedule a FLU-CLINIC appointment**  After your visit with [place] today you will receive a survey in the mail or online from [employer] regarding your care with [place]. Please take a moment to fill this out. Your feedback is very important to [place] as you can help [place] better understand your patient needs as well as improve your experience and satisfaction. WE CARE ABOUT YOU!!!   For the short term use the nebulizer treatment instead of the handheld albuterol. This will be more effective for your breathing Consider getting a cool mist humidifier Continue to take Mucinex one twice daily with a large glass of water Keep the house as cool as possible Take the prednisone as directed We will call with the results of the CBC and depending on those results and the white blood cell count we will consider adding an additional antibiotic if necessary Return to clinic Saturday for fasting blood work protocol  Arrie Senate MD

## 2016-08-23 NOTE — Addendum Note (Signed)
Addended by: Liliane Bade on: 08/23/2016 05:17 PM   Modules accepted: Orders

## 2016-08-24 LAB — CBC WITH DIFFERENTIAL/PLATELET
Basophils Absolute: 0 10*3/uL (ref 0.0–0.2)
Basos: 0 %
EOS (ABSOLUTE): 0.3 10*3/uL (ref 0.0–0.4)
Eos: 3 %
Hematocrit: 44.6 % (ref 34.0–46.6)
Hemoglobin: 15 g/dL (ref 11.1–15.9)
Immature Grans (Abs): 0 10*3/uL (ref 0.0–0.1)
Immature Granulocytes: 0 %
Lymphocytes Absolute: 3.3 10*3/uL — ABNORMAL HIGH (ref 0.7–3.1)
Lymphs: 33 %
MCH: 30.9 pg (ref 26.6–33.0)
MCHC: 33.6 g/dL (ref 31.5–35.7)
MCV: 92 fL (ref 79–97)
Monocytes Absolute: 0.7 10*3/uL (ref 0.1–0.9)
Monocytes: 7 %
Neutrophils Absolute: 5.8 10*3/uL (ref 1.4–7.0)
Neutrophils: 57 %
Platelets: 233 10*3/uL (ref 150–379)
RBC: 4.85 x10E6/uL (ref 3.77–5.28)
RDW: 14.3 % (ref 12.3–15.4)
WBC: 10.1 10*3/uL (ref 3.4–10.8)

## 2016-08-26 ENCOUNTER — Other Ambulatory Visit: Payer: BLUE CROSS/BLUE SHIELD

## 2016-08-26 DIAGNOSIS — I10 Essential (primary) hypertension: Secondary | ICD-10-CM

## 2016-08-26 DIAGNOSIS — K219 Gastro-esophageal reflux disease without esophagitis: Secondary | ICD-10-CM

## 2016-08-26 DIAGNOSIS — E559 Vitamin D deficiency, unspecified: Secondary | ICD-10-CM | POA: Diagnosis not present

## 2016-08-26 DIAGNOSIS — E7800 Pure hypercholesterolemia, unspecified: Secondary | ICD-10-CM | POA: Diagnosis not present

## 2016-08-28 LAB — BMP8+EGFR
BUN / CREAT RATIO: 15 (ref 9–23)
BUN: 11 mg/dL (ref 6–24)
CO2: 25 mmol/L (ref 18–29)
Calcium: 9.5 mg/dL (ref 8.7–10.2)
Chloride: 102 mmol/L (ref 96–106)
Creatinine, Ser: 0.73 mg/dL (ref 0.57–1.00)
GFR calc Af Amer: 106 mL/min/{1.73_m2} (ref >59)
GFR, EST NON AFRICAN AMERICAN: 92 mL/min/{1.73_m2} (ref >59)
GLUCOSE: 119 mg/dL — AB (ref 65–99)
POTASSIUM: 4 mmol/L (ref 3.5–5.2)
SODIUM: 144 mmol/L (ref 134–144)

## 2016-08-28 LAB — NMR, LIPOPROFILE
CHOLESTEROL: 144 mg/dL (ref 100–199)
HDL Cholesterol by NMR: 50 mg/dL (ref >39)
HDL Particle Number: 32.9 umol/L (ref >=30.5)
LDL PARTICLE NUMBER: 931 nmol/L (ref <1000)
LDL Size: 20.6 nm (ref >20.5)
LDL-C: 75 mg/dL (ref 0–99)
LP-IR SCORE: 49 — AB (ref <=45)
Small LDL Particle Number: 384 nmol/L (ref <=527)
Triglycerides by NMR: 94 mg/dL (ref 0–149)

## 2016-08-28 LAB — HEPATIC FUNCTION PANEL
ALBUMIN: 4.3 g/dL (ref 3.5–5.5)
ALT: 17 IU/L (ref 0–32)
AST: 15 IU/L (ref 0–40)
Alkaline Phosphatase: 69 IU/L (ref 39–117)
Bilirubin Total: <0.2 mg/dL (ref 0.0–1.2)
Bilirubin, Direct: 0.08 mg/dL (ref 0.00–0.40)
TOTAL PROTEIN: 7 g/dL (ref 6.0–8.5)

## 2016-08-28 LAB — VITAMIN D 25 HYDROXY (VIT D DEFICIENCY, FRACTURES): VIT D 25 HYDROXY: 24.1 ng/mL — AB (ref 30.0–100.0)

## 2016-08-29 MED ORDER — VITAMIN D (ERGOCALCIFEROL) 1.25 MG (50000 UNIT) PO CAPS: 50000.0000 [IU] | ORAL_CAPSULE | ORAL | 1 refills | Status: DC

## 2016-09-01 ENCOUNTER — Ambulatory Visit (HOSPITAL_BASED_OUTPATIENT_CLINIC_OR_DEPARTMENT_OTHER): Payer: BLUE CROSS/BLUE SHIELD

## 2016-09-01 NOTE — Progress Notes (Signed)
Therapeutic phlebotomy performed via 16G to L AC from 1346 to 1350 and  Yielded 533g. Bandage applied and pt tolerated well, did support patient with feet up and beverage after procedure due to lightheadedness. She is alert and oriented. She recovered within 5 mins and is now eating a snack. Overall she said she tolerated it better than the first time.

## 2016-09-01 NOTE — Patient Instructions (Signed)
Therapeutic Phlebotomy Therapeutic phlebotomy is the controlled removal of blood from a person's body for the purpose of treating a medical condition. The procedure is similar to donating blood. Usually, about a pint (470 mL, or 0.47L) of blood is removed. The average adult has 9-12 pints (4.3-5.7 L) of blood. Therapeutic phlebotomy may be used to treat the following medical conditions:  Hemochromatosis. This is a condition in which the blood contains too much iron.  Polycythemia vera. This is a condition in which the blood contains too many red blood cells.  Porphyria cutanea tarda. This is a disease in which an important part of hemoglobin is not made properly. It results in the buildup of abnormal amounts of porphyrins in the body.  Sickle cell disease. This is a condition in which the red blood cells form an abnormal crescent shape rather than a round shape. Tell a health care provider about:  Any allergies you have.  All medicines you are taking, including vitamins, herbs, eye drops, creams, and over-the-counter medicines.  Any problems you or family members have had with anesthetic medicines.  Any blood disorders you have.  Any surgeries you have had.  Any medical conditions you have. What are the risks? Generally, this is a safe procedure. However, problems may occur, including:  Nausea or light-headedness.  Low blood pressure.  Soreness, bleeding, swelling, or bruising at the needle insertion site.  Infection. What happens before the procedure?  Follow instructions from your health care provider about eating or drinking restrictions.  Ask your health care provider about changing or stopping your regular medicines. This is especially important if you are taking diabetes medicines or blood thinners.  Wear clothing with sleeves that can be raised above the elbow.  Plan to have someone take you home after the procedure.  You may have a blood sample taken. What happens  during the procedure?  A needle will be inserted into one of your veins.  Tubing and a collection bag will be attached to that needle.  Blood will flow through the needle and tubing into the collection bag.  You may be asked to open and close your hand slowly and continually during the entire collection.  After the specified amount of blood has been removed from your body, the collection bag and tubing will be clamped.  The needle will be removed from your vein.  Pressure will be held on the site of the needle insertion to stop the bleeding.  A bandage (dressing) will be placed over the needle insertion site. The procedure may vary among health care providers and hospitals. What happens after the procedure?  Your recovery will be assessed and monitored.  You can return to your normal activities as directed by your health care provider. This information is not intended to replace advice given to you by your health care provider. Make sure you discuss any questions you have with your health care provider. Document Released: 12/19/2010 Document Revised: 03/18/2016 Document Reviewed: 07/13/2014 Elsevier Interactive Patient Education  2017 [employer]  

## 2016-10-25 ENCOUNTER — Other Ambulatory Visit: Payer: Self-pay | Admitting: Family Medicine

## 2016-10-26 ENCOUNTER — Telehealth: Payer: Self-pay | Admitting: Family Medicine

## 2016-10-26 NOTE — Telephone Encounter (Signed)
What is the name of the medication? Advair  Have you contacted your pharmacy to request a refill? Yes  Which pharmacy would you like this sent to? [facility] in [place]   Patient notified that their request is being sent to the clinical staff for review and that they should receive a call once it is complete. If they do not receive a call within 24 hours they can check with their pharmacy or our office.

## 2016-10-26 NOTE — Telephone Encounter (Signed)
Done earlier this am

## 2016-11-24 ENCOUNTER — Other Ambulatory Visit: Payer: Self-pay | Admitting: Family Medicine

## 2016-11-29 ENCOUNTER — Other Ambulatory Visit: Payer: Self-pay | Admitting: Family Medicine

## 2016-12-21 ENCOUNTER — Other Ambulatory Visit: Payer: Self-pay | Admitting: Family Medicine

## 2016-12-21 DIAGNOSIS — Z85820 Personal history of malignant melanoma of skin: Secondary | ICD-10-CM | POA: Diagnosis not present

## 2016-12-21 DIAGNOSIS — L814 Other melanin hyperpigmentation: Secondary | ICD-10-CM | POA: Diagnosis not present

## 2016-12-21 DIAGNOSIS — D225 Melanocytic nevi of trunk: Secondary | ICD-10-CM | POA: Diagnosis not present

## 2016-12-21 DIAGNOSIS — L821 Other seborrheic keratosis: Secondary | ICD-10-CM | POA: Diagnosis not present

## 2016-12-27 ENCOUNTER — Ambulatory Visit: Payer: BLUE CROSS/BLUE SHIELD | Admitting: Family Medicine

## 2017-01-15 ENCOUNTER — Ambulatory Visit: Payer: BLUE CROSS/BLUE SHIELD | Admitting: Family Medicine

## 2017-01-22 ENCOUNTER — Other Ambulatory Visit: Payer: BLUE CROSS/BLUE SHIELD

## 2017-01-22 DIAGNOSIS — K219 Gastro-esophageal reflux disease without esophagitis: Secondary | ICD-10-CM

## 2017-01-22 DIAGNOSIS — E559 Vitamin D deficiency, unspecified: Secondary | ICD-10-CM

## 2017-01-22 DIAGNOSIS — I10 Essential (primary) hypertension: Secondary | ICD-10-CM

## 2017-01-22 DIAGNOSIS — E7800 Pure hypercholesterolemia, unspecified: Secondary | ICD-10-CM

## 2017-01-23 LAB — BMP8+EGFR
BUN/Creatinine Ratio: 14 (ref 9–23)
BUN: 11 mg/dL (ref 6–24)
CALCIUM: 9.7 mg/dL (ref 8.7–10.2)
CHLORIDE: 102 mmol/L (ref 96–106)
CO2: 23 mmol/L (ref 20–29)
Creatinine, Ser: 0.78 mg/dL (ref 0.57–1.00)
GFR, EST AFRICAN AMERICAN: 97 mL/min/{1.73_m2} (ref >59)
GFR, EST NON AFRICAN AMERICAN: 84 mL/min/{1.73_m2} (ref >59)
Glucose: 95 mg/dL (ref 65–99)
Potassium: 3.7 mmol/L (ref 3.5–5.2)
Sodium: 146 mmol/L — ABNORMAL HIGH (ref 134–144)

## 2017-01-23 LAB — CBC WITH DIFFERENTIAL/PLATELET
BASOS ABS: 0 10*3/uL (ref 0.0–0.2)
BASOS: 1 %
EOS (ABSOLUTE): 0.4 10*3/uL (ref 0.0–0.4)
Eos: 6 %
HEMOGLOBIN: 15.1 g/dL (ref 11.1–15.9)
Hematocrit: 45.2 % (ref 34.0–46.6)
IMMATURE GRANS (ABS): 0 10*3/uL (ref 0.0–0.1)
IMMATURE GRANULOCYTES: 0 %
LYMPHS: 34 %
Lymphocytes Absolute: 2.6 10*3/uL (ref 0.7–3.1)
MCH: 30.8 pg (ref 26.6–33.0)
MCHC: 33.4 g/dL (ref 31.5–35.7)
MCV: 92 fL (ref 79–97)
MONOCYTES: 6 %
Monocytes Absolute: 0.4 10*3/uL (ref 0.1–0.9)
NEUTROS ABS: 4.2 10*3/uL (ref 1.4–7.0)
NEUTROS PCT: 53 %
PLATELETS: 173 10*3/uL (ref 150–379)
RBC: 4.91 x10E6/uL (ref 3.77–5.28)
RDW: 14.5 % (ref 12.3–15.4)
WBC: 7.7 10*3/uL (ref 3.4–10.8)

## 2017-01-23 LAB — LIPID PANEL
Chol/HDL Ratio: 3.3 ratio (ref 0.0–4.4)
Cholesterol, Total: 144 mg/dL (ref 100–199)
HDL: 44 mg/dL (ref >39)
LDL CALC: 70 mg/dL (ref 0–99)
Triglycerides: 150 mg/dL — ABNORMAL HIGH (ref 0–149)
VLDL CHOLESTEROL CAL: 30 mg/dL (ref 5–40)

## 2017-01-23 LAB — HEPATIC FUNCTION PANEL
ALK PHOS: 81 IU/L (ref 39–117)
ALT: 23 IU/L (ref 0–32)
AST: 28 IU/L (ref 0–40)
Albumin: 4.7 g/dL (ref 3.5–5.5)
BILIRUBIN TOTAL: 0.4 mg/dL (ref 0.0–1.2)
BILIRUBIN, DIRECT: 0.11 mg/dL (ref 0.00–0.40)
TOTAL PROTEIN: 7.2 g/dL (ref 6.0–8.5)

## 2017-01-23 LAB — VITAMIN D 25 HYDROXY (VIT D DEFICIENCY, FRACTURES): VIT D 25 HYDROXY: 39.4 ng/mL (ref 30.0–100.0)

## 2017-01-24 ENCOUNTER — Encounter: Payer: Self-pay | Admitting: Family Medicine

## 2017-01-24 ENCOUNTER — Ambulatory Visit (INDEPENDENT_AMBULATORY_CARE_PROVIDER_SITE_OTHER): Payer: BLUE CROSS/BLUE SHIELD

## 2017-01-24 ENCOUNTER — Ambulatory Visit (INDEPENDENT_AMBULATORY_CARE_PROVIDER_SITE_OTHER): Payer: BLUE CROSS/BLUE SHIELD | Admitting: Family Medicine

## 2017-01-24 VITALS — BP 111/73 | HR 80 | Temp 97.3°F | Ht 63.0 in | Wt 202.0 lb

## 2017-01-24 DIAGNOSIS — I10 Essential (primary) hypertension: Secondary | ICD-10-CM

## 2017-01-24 DIAGNOSIS — J42 Unspecified chronic bronchitis: Secondary | ICD-10-CM

## 2017-01-24 DIAGNOSIS — J309 Allergic rhinitis, unspecified: Secondary | ICD-10-CM

## 2017-01-24 DIAGNOSIS — E7800 Pure hypercholesterolemia, unspecified: Secondary | ICD-10-CM | POA: Diagnosis not present

## 2017-01-24 DIAGNOSIS — J45909 Unspecified asthma, uncomplicated: Secondary | ICD-10-CM | POA: Diagnosis not present

## 2017-01-24 DIAGNOSIS — E559 Vitamin D deficiency, unspecified: Secondary | ICD-10-CM | POA: Diagnosis not present

## 2017-01-24 DIAGNOSIS — K219 Gastro-esophageal reflux disease without esophagitis: Secondary | ICD-10-CM | POA: Diagnosis not present

## 2017-01-24 MED ORDER — VITAMIN D (ERGOCALCIFEROL) 1.25 MG (50000 UNIT) PO CAPS: 50000.0000 [IU] | ORAL_CAPSULE | ORAL | 3 refills | Status: DC

## 2017-01-24 NOTE — Progress Notes (Signed)
Subjective:    Patient ID: Sarah Garrett, female    DOB: Dec 12, 1960, 56 y.o.   MRN: [id number]  HPI Pt here for follow up and management of chronic medical problems which includes hyperlipidemia and hypertension. She is taking medication regularly.The patient has a long history of asthma and allergic bronchospasm. She is due to get a chest x-ray an FOBT will be given to her and we'll have her lab work reviewed with her during the office visit today. She is also due to get a Pap smear and mammogram and plans to schedule this. The vitamin D level on the most recent lab work was 39.4. We may get her to increase the vitamin D slightly. The blood sugar was good at 95 and all electrolytes are good except the serum sodium was slightly elevated at 146. The CBC had a normal white blood cell count a good hemoglobin at 15.1 and an adequate platelet count. All liver function tests were normal. Lipid panel was good with an LDL C is good and at goal at 70. The HDL was good at 44. Triglycerides were slightly elevated at 150 and we will continue to have the patient take her current medicines and continue with aggressive therapeutic lifestyle changes. The patient follows regularly with the hematologist because of hemochromatosis. She also sees the dermatologist about every 6 months because of melanoma of the right shoulder. She denies any chest pain or shortness of breath anymore than usual recently and attributes this to using her Advair regularly her rescue inhaler Singulair and Zyrtec. She is in good spirits today except she is worried about her mother who lost her husband recently and doesn't seem to want about back from this loss. She denies any trouble with her stomach including nausea vomiting diarrhea or blood in the stool. We did double check and her next colonoscopy is not due until 10 years after the last one which would be sometime in May 2023. She has not been taking the vitamin D regularly and we will put  her back on the 50,000 units once weekly and indefinitely. She is passing her water without problems.    Patient Active Problem List   Diagnosis Date Noted  . Contact dermatitis 03/24/2013  . Asthma 03/24/2013  . Melanoma of upper arm, right. T1a., History of 09/07/2011  . Hemochromatosis 07/07/2011  . Hypothyroidism 02/17/2008  . Hyperlipidemia 10/11/2007  . ANEMIA-NOS 10/11/2007  . Essential hypertension 10/11/2007  . ALLERGIC RHINITIS 10/11/2007  . BRONCHITIS, CHRONIC 10/11/2007  . GERD 10/11/2007  . ADENOIDECTOMY, HX OF 10/11/2007   Outpatient Encounter Prescriptions as of 01/24/2017  Medication Sig  . ADVAIR DISKUS 100-50 MCG/DOSE AEPB INHALE 1 PUFF TWICE DAILY  . albuterol (PROVENTIL HFA;VENTOLIN HFA) 108 (90 Base) MCG/ACT inhaler Inhale 2 puffs into the lungs every 6 (six) hours as needed.  . cetirizine (ZYRTEC) 10 MG tablet Take 10 mg by mouth daily.    Marland Kitchen esomeprazole (NEXIUM) 40 MG capsule Take 1 capsule (40 mg total) by mouth as needed.  . furosemide (LASIX) 40 MG tablet TAKE 1 TABLET EVERY DAY AS NEEDED  . hyoscyamine (HYOMAX-SL) 0.125 MG SL tablet Place 1 tablet (0.125 mg total) under the tongue as needed. Place 1-2 tablet under the tongue until it dissolves as needed  . levothyroxine (SYNTHROID, LEVOTHROID) 125 MCG tablet TAKE A 1/2 TABLET ON MON, WED, FRI AND 1 TABLET ALL OTHER DAYS (Patient taking differently: 1/2 on Mon, 1 whole other days)  . losartan-hydrochlorothiazide (HYZAAR) 100-12.5 MG  tablet TAKE 1 TABLET BY MOUTH DAILY. AS DIRECTED  . montelukast (SINGULAIR) 10 MG tablet TAKE 1 TABLET (10 MG TOTAL) BY MOUTH AT BEDTIME.  . rosuvastatin (CRESTOR) 20 MG tablet TAKE 1 TABLET (20 MG TOTAL) BY MOUTH DAILY.  . [DISCONTINUED] furosemide (LASIX) 40 MG tablet TAKE 1 TABLET EVERY DAY AS NEEDED  . [DISCONTINUED] predniSONE (DELTASONE) 10 MG tablet Take 1 tab QID x 2 days, then 1 tab TID x 2 days, then 1 tab BID x 2 days, then 1 tab QD x 2 days.  . [DISCONTINUED] Vitamin  D, Ergocalciferol, (DRISDOL) 50000 units CAPS capsule Take 1 capsule (50,000 Units total) by mouth every 7 (seven) days.   No facility-administered encounter medications on file as of 01/24/2017.       Review of Systems  Constitutional: Negative.   HENT: Negative.   Eyes: Negative.   Respiratory: Negative.   Cardiovascular: Negative.   Gastrointestinal: Negative.   Endocrine: Negative.   Genitourinary: Negative.   Musculoskeletal: Negative.   Skin: Negative.   Allergic/Immunologic: Negative.   Neurological: Negative.   Hematological: Negative.   Psychiatric/Behavioral: Negative.        Objective:   Physical Exam  Constitutional: She is oriented to person, place, and time. She appears well-developed and well-nourished. No distress.  The patient is pleasant and alert  HENT:  Head: Normocephalic and atraumatic.  Right Ear: External ear normal.  Left Ear: External ear normal.  Nose: Nose normal.  Mouth/Throat: Oropharynx is clear and moist.  Eyes: Conjunctivae and EOM are normal. Pupils are equal, round, and reactive to light. Right eye exhibits no discharge. Left eye exhibits no discharge. No scleral icterus.  Neck: Normal range of motion. Neck supple. No thyromegaly present.  No bruits thyromegaly or anterior cervical adenopathy  Cardiovascular: Normal rate, regular rhythm, normal heart sounds and intact distal pulses.   No murmur heard. Heart is regular at 72/m  Pulmonary/Chest: Effort normal and breath sounds normal. No respiratory distress. She has no wheezes. She has no rales.  Clear anteriorly and posteriorly with no wheezes rales or rhonchi  Abdominal: Soft. Bowel sounds are normal. She exhibits no mass. There is no tenderness. There is no rebound and no guarding.  No abdominal tenderness masses or bruits  Musculoskeletal: Normal range of motion. She exhibits no edema.  Lymphadenopathy:    She has no cervical adenopathy.  Neurological: She is alert and oriented to  person, place, and time. She has normal reflexes. No cranial nerve deficit.  Skin: Skin is warm and dry. No rash noted.  Psychiatric: She has a normal mood and affect. Her behavior is normal. Judgment and thought content normal.  Nursing note and vitals reviewed.   BP 111/73 (BP Location: Left Arm)   Pulse 80   Temp 97.3 F (36.3 C) (Oral)   Ht 5\' 3"  (1.6 m)   Wt 202 lb (91.6 kg)   LMP 11/29/2011   BMI 35.78 kg/m   Chest x-ray with results pending===     Assessment & Plan:  1. Pure hypercholesterolemia -Continue current treatment and aggressive therapeutic lifestyle changes - DG Chest 2 View; Future  2. Essential hypertension -Continue current treatment and sodium restriction - DG Chest 2 View; Future  3. Gastroesophageal reflux disease, esophagitis presence not specified -No complaints today with reflux and she will continue with her Nexium  4. Vitamin D deficiency -New prescription to be called in for vitamin D 50,000 units and take one weekly and indefinitely  5. Uncomplicated asthma,  unspecified asthma severity, unspecified whether persistent -Continue with nebulizer and rescue inhaler as well as medications for allergic rhinitis - DME Nebulizer machine  6. Chronic bronchitis, unspecified chronic bronchitis type ([place]) -Continue with inhalers and allergic rhinitis medications - DME Nebulizer machine  7. Allergic rhinitis, unspecified seasonality, unspecified trigger -Continue with Zyrtec and Singulair and avoid irritating environments as much as possible - DME Nebulizer machine  Patient Instructions                       Medicare Annual Wellness Visit  [facility] and the medical providers at [facility] strive to bring you the best medical care.  In doing so we not only want to address your current medical conditions and concerns but also to detect new conditions early and prevent illness, disease and health-related problems.    Medicare  offers a yearly Wellness Visit which allows our clinical staff to assess your need for preventative services including immunizations, lifestyle education, counseling to decrease risk of preventable diseases and screening for fall risk and other medical concerns.    This visit is provided free of charge (no copay) for all Medicare recipients. The clinical pharmacists at [facility] have begun to conduct these Wellness Visits which will also include a thorough review of all your medications.    As you primary medical provider recommend that you make an appointment for your Annual Wellness Visit if you have not done so already this year.  You may set up this appointment before you leave today or you may call back ([phone]) and schedule an appointment.  Please make sure when you call that you mention that you are scheduling your Annual Wellness Visit with the clinical pharmacist so that the appointment may be made for the proper length of time.     Continue current medications. Continue good therapeutic lifestyle changes which include good diet and exercise. Fall precautions discussed with patient. If an FOBT was given today- please return it to our front desk. If you are over 52 years old - you may need Prevnar 67 or the adult Pneumonia vaccine.  **Flu shots are available--- please call and schedule a FLU-CLINIC appointment**  After your visit with [place] today you will receive a survey in the mail or online from [employer] regarding your care with [place]. Please take a moment to fill this out. Your feedback is very important to [place] as you can help [place] better understand your patient needs as well as improve your experience and satisfaction. WE CARE ABOUT YOU!!!   Follow-up with hematologist and dermatologist as planned Always be prepared for good respiratory protection Use rescue inhaler as needed Stay on medications for asthma as currently doing Drink plenty of fluids this summer  and stay well hydrated   Arrie Senate MD

## 2017-01-24 NOTE — Patient Instructions (Addendum)
Medicare Annual Wellness Visit  [facility] and the medical providers at [facility] strive to bring you the best medical care.  In doing so we not only want to address your current medical conditions and concerns but also to detect new conditions early and prevent illness, disease and health-related problems.    Medicare offers a yearly Wellness Visit which allows our clinical staff to assess your need for preventative services including immunizations, lifestyle education, counseling to decrease risk of preventable diseases and screening for fall risk and other medical concerns.    This visit is provided free of charge (no copay) for all Medicare recipients. The clinical pharmacists at [facility] have begun to conduct these Wellness Visits which will also include a thorough review of all your medications.    As you primary medical provider recommend that you make an appointment for your Annual Wellness Visit if you have not done so already this year.  You may set up this appointment before you leave today or you may call back ([phone]) and schedule an appointment.  Please make sure when you call that you mention that you are scheduling your Annual Wellness Visit with the clinical pharmacist so that the appointment may be made for the proper length of time.     Continue current medications. Continue good therapeutic lifestyle changes which include good diet and exercise. Fall precautions discussed with patient. If an FOBT was given today- please return it to our front desk. If you are over 70 years old - you may need Prevnar 78 or the adult Pneumonia vaccine.  **Flu shots are available--- please call and schedule a FLU-CLINIC appointment**  After your visit with [place] today you will receive a survey in the mail or online from [employer] regarding your care with [place]. Please take a moment to fill this out. Your feedback is very  important to [place] as you can help [place] better understand your patient needs as well as improve your experience and satisfaction. WE CARE ABOUT YOU!!!   Follow-up with hematologist and dermatologist as planned Always be prepared for good respiratory protection Use rescue inhaler as needed Stay on medications for asthma as currently doing Drink plenty of fluids this summer and stay well hydrated

## 2017-01-26 ENCOUNTER — Ambulatory Visit (HOSPITAL_BASED_OUTPATIENT_CLINIC_OR_DEPARTMENT_OTHER): Payer: BLUE CROSS/BLUE SHIELD | Admitting: Hematology & Oncology

## 2017-01-26 ENCOUNTER — Other Ambulatory Visit: Payer: BLUE CROSS/BLUE SHIELD

## 2017-01-26 ENCOUNTER — Ambulatory Visit: Payer: BLUE CROSS/BLUE SHIELD | Admitting: Hematology & Oncology

## 2017-01-26 ENCOUNTER — Encounter: Payer: Self-pay | Admitting: *Deleted

## 2017-01-26 ENCOUNTER — Other Ambulatory Visit (HOSPITAL_BASED_OUTPATIENT_CLINIC_OR_DEPARTMENT_OTHER): Payer: BLUE CROSS/BLUE SHIELD

## 2017-01-26 DIAGNOSIS — L989 Disorder of the skin and subcutaneous tissue, unspecified: Secondary | ICD-10-CM

## 2017-01-26 DIAGNOSIS — A6920 Lyme disease, unspecified: Secondary | ICD-10-CM

## 2017-01-26 LAB — CMP (CANCER CENTER ONLY)
ALBUMIN: 4.2 g/dL (ref 3.3–5.5)
ALT(SGPT): 38 U/L (ref 10–47)
AST: 35 U/L (ref 11–38)
Alkaline Phosphatase: 73 U/L (ref 26–84)
BUN, Bld: 8 mg/dL (ref 7–22)
CALCIUM: 9.8 mg/dL (ref 8.0–10.3)
CHLORIDE: 106 meq/L (ref 98–108)
CO2: 30 meq/L (ref 18–33)
Creat: 0.8 mg/dl (ref 0.6–1.2)
Glucose, Bld: 101 mg/dL (ref 73–118)
Potassium: 3.4 mEq/L (ref 3.3–4.7)
Sodium: 142 mEq/L (ref 128–145)
Total Bilirubin: 0.7 mg/dl (ref 0.20–1.60)
Total Protein: 7.6 g/dL (ref 6.4–8.1)

## 2017-01-26 LAB — CBC WITH DIFFERENTIAL (CANCER CENTER ONLY)
BASO#: 0 10*3/uL (ref 0.0–0.2)
BASO%: 0.4 % (ref 0.0–2.0)
EOS ABS: 0.4 10*3/uL (ref 0.0–0.5)
EOS%: 5.8 % (ref 0.0–7.0)
HCT: 43.2 % (ref 34.8–46.6)
HEMOGLOBIN: 15 g/dL (ref 11.6–15.9)
LYMPH#: 2.3 10*3/uL (ref 0.9–3.3)
LYMPH%: 32.5 % (ref 14.0–48.0)
MCH: 31 pg (ref 26.0–34.0)
MCHC: 34.7 g/dL (ref 32.0–36.0)
MCV: 89 fL (ref 81–101)
MONO#: 0.4 10*3/uL (ref 0.1–0.9)
MONO%: 5.8 % (ref 0.0–13.0)
NEUT%: 55.5 % (ref 39.6–80.0)
NEUTROS ABS: 3.9 10*3/uL (ref 1.5–6.5)
Platelets: 152 10*3/uL (ref 145–400)
RBC: 4.84 10*6/uL (ref 3.70–5.32)
RDW: 13.4 % (ref 11.1–15.7)
WBC: 7 10*3/uL (ref 3.9–10.0)

## 2017-01-26 LAB — IRON AND TIBC
%SAT: 26 % (ref 21–57)
IRON: 94 ug/dL (ref 41–142)
TIBC: 358 ug/dL (ref 236–444)
UIBC: 263 ug/dL (ref 120–384)

## 2017-01-26 LAB — FERRITIN: FERRITIN: 77 ng/mL (ref 9–269)

## 2017-01-26 LAB — LACTATE DEHYDROGENASE: LDH: 250 U/L — ABNORMAL HIGH (ref 125–245)

## 2017-01-26 MED ORDER — AMOXICILLIN 500 MG PO TABS: 500.0000 mg | ORAL_TABLET | Freq: Three times a day (TID) | ORAL | 0 refills | Status: DC

## 2017-01-26 MED ORDER — DOXYCYCLINE HYCLATE 100 MG PO TABS: 100.0000 mg | ORAL_TABLET | Freq: Two times a day (BID) | ORAL | 0 refills | Status: DC

## 2017-01-26 NOTE — Addendum Note (Signed)
Addended by: [name] on: 01/26/2017 04:35 PM   Modules accepted: Orders

## 2017-01-26 NOTE — Progress Notes (Signed)
Hematology and Oncology Follow Up Visit  Sarah Garrett [id number] March 20, 1961 56 y.o. 01/26/2017   Principle Diagnosis:  1. 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 2. 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  Current Therapy:    Observation     Interim History:  [name] is back for followup. She looks quite good. She got back from the [employer]. She and her family were on vacation there. They were there couple weeks ago. As always, she was very liberal with using sunscreen.  She and her mom are going to [place] in September. They are looking forward to this. I know that this will be a wonderful time for her.  She show me this lesion that she had on her lateral right hip. It almost looks like a "bull's-eye lesion" with Lyme disease. She does not recall being bitten by a tick. However, I am worried about this. She noticed this a few days ago. It might be slightly puritic. I think that we will have to treat this. I'll put her on amoxicillin 500 mg by mouth 3 times a day for 14 days.   She is still working. She's having no problem with work.   Her daughter is doing well. Her daughter has   ICH . She's not had any issues with this. She is working down in [place].   She's had no problems with cough. She's had no nausea or vomiting. There's been no change in bowel or bladder habits.   Overall, her performance status is ECOG 0.   Medications:  Current Outpatient Prescriptions:  .  ADVAIR DISKUS 100-50 MCG/DOSE AEPB, INHALE 1 PUFF TWICE DAILY, Disp: 60 each, Rfl: 5 .  albuterol (PROVENTIL HFA;VENTOLIN HFA) 108 (90 Base) MCG/ACT inhaler, Inhale 2 puffs into the lungs every 6 (six) hours as needed., Disp: 18 g, Rfl: 6 .  cetirizine (ZYRTEC) 10 MG tablet, Take 10 mg by mouth daily.  , Disp: , Rfl:  .  esomeprazole (NEXIUM) 40 MG capsule, Take 1 capsule (40 mg total) by mouth as needed., Disp: 30 capsule, Rfl: 6 .  furosemide (LASIX) 40 MG tablet, TAKE 1  TABLET EVERY DAY AS NEEDED, Disp: 30 tablet, Rfl: 6 .  hyoscyamine (HYOMAX-SL) 0.125 MG SL tablet, Place 1 tablet (0.125 mg total) under the tongue as needed. Place 1-2 tablet under the tongue until it dissolves as needed, Disp: 30 tablet, Rfl: 2 .  levothyroxine (SYNTHROID, LEVOTHROID) 125 MCG tablet, TAKE A 1/2 TABLET ON MON, WED, FRI AND 1 TABLET ALL OTHER DAYS (Patient taking differently: 1/2 on Mon, 1 whole other days), Disp: 30 tablet, Rfl: 6 .  losartan-hydrochlorothiazide (HYZAAR) 100-12.5 MG tablet, TAKE 1 TABLET BY MOUTH DAILY. AS DIRECTED, Disp: 30 tablet, Rfl: 2 .  montelukast (SINGULAIR) 10 MG tablet, TAKE 1 TABLET (10 MG TOTAL) BY MOUTH AT BEDTIME., Disp: 30 tablet, Rfl: 2 .  rosuvastatin (CRESTOR) 20 MG tablet, TAKE 1 TABLET (20 MG TOTAL) BY MOUTH DAILY., Disp: 30 tablet, Rfl: 5 .  Vitamin D, Ergocalciferol, (DRISDOL) 50000 units CAPS capsule, Take 1 capsule (50,000 Units total) by mouth every 7 (seven) days., Disp: 12 capsule, Rfl: 3  Allergies:  Allergies  Allergen Reactions  . Ace Inhibitors     Per QA:STMHDQQ  . Actos [Pioglitazone Hydrochloride]     Per pt: unknown  . Aspirin Swelling    Swelling of mouth and throat  . Ciprofloxacin   . Crestor [Rosuvastatin Calcium]     Per pt:  unknown  . Pneumococcal Vaccines     Red rash  . Sulfonamide Derivatives     Per pt: unknown    Past Medical History, Surgical history, Social history, and Family History were reviewed and updated.  Review of Systems: As above  Physical Exam:  vitals were not taken for this visit.  Well-developed and well-nourished white female. Head and neck exam shows no ocular or oral lesions. She has no palpable cervical or supraclavicular lymph nodes. Lungs are clear. Cardiac exam regular rate and rhythm with no murmurs rubs or bruits. Abdomen soft. She has good bowel sounds. There is no fluid wave. There is no palpable liver or spleen tip. Back exam shows no tenderness over the spine ribs or hips.  Extremities shows no clubbing cyanosis or edema. Skin exam shows This area of erythema migrans on the right lateral hip.Marland Kitchen Neurological exam is nonfocal.  Lab Results  Component Value Date   WBC 7.0 01/26/2017   HGB 15.0 01/26/2017   HCT 43.2 01/26/2017   MCV 89 01/26/2017   PLT 152 01/26/2017     Chemistry      Component Value Date/Time   NA 146 (H) 01/22/2017 1003   NA 140 07/19/2016 0834   K 3.7 01/22/2017 1003   K 3.2 (L) 07/19/2016 0834   CL 102 01/22/2017 1003   CL 101 07/15/2014 0833   CO2 23 01/22/2017 1003   CO2 27 07/19/2016 0834   BUN 11 01/22/2017 1003   BUN 11.0 07/19/2016 0834   CREATININE 0.78 01/22/2017 1003   CREATININE 0.8 07/19/2016 0834      Component Value Date/Time   CALCIUM 9.7 01/22/2017 1003   CALCIUM 10.1 07/19/2016 0834   ALKPHOS 81 01/22/2017 1003   ALKPHOS 85 07/19/2016 0834   AST 28 01/22/2017 1003   AST 26 07/19/2016 0834   ALT 23 01/22/2017 1003   ALT 25 07/19/2016 0834   BILITOT 0.4 01/22/2017 1003   BILITOT 0.81 07/19/2016 0834         Impression and Plan: Sarah Garrett is 56 year old white female. She has hereditary hemochromatosis. 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  She was last phlebotomized back in February.  We will see what her iron studies show. I would think that they will be okay.  Hopefully, the amoxicillin will help with respect to the possibility of Lyme disease.  I don't see any problem with her going to [place]. I know she'll have a great time. I told her to make sure she wears a lot of sunscreen and drinks a lot of water.  I will see what her iron studies show.   We will plan to get her back in another 6 months.  [name], MD 6/29/20189:41 AM

## 2017-01-30 LAB — ROCKY MTN SPOTTED FVR ABS PNL(IGG+IGM)
RMSF IGG: NEGATIVE
RMSF IgM: 0.26 index (ref 0.00–0.89)

## 2017-01-30 LAB — LYME AB/WESTERN BLOT REFLEX: Lyme IgG/IgM Ab: <0.91 {ISR} (ref 0.00–0.90)

## 2017-01-30 LAB — SPECIMEN STATUS REPORT

## 2017-02-02 ENCOUNTER — Encounter: Payer: Self-pay | Admitting: Physician Assistant

## 2017-02-02 ENCOUNTER — Telehealth: Payer: Self-pay | Admitting: Family Medicine

## 2017-02-02 ENCOUNTER — Ambulatory Visit (INDEPENDENT_AMBULATORY_CARE_PROVIDER_SITE_OTHER): Payer: BLUE CROSS/BLUE SHIELD | Admitting: Physician Assistant

## 2017-02-02 ENCOUNTER — Other Ambulatory Visit: Payer: Self-pay | Admitting: *Deleted

## 2017-02-02 VITALS — BP 132/89 | HR 78 | Temp 98.0°F | Ht 63.0 in | Wt 202.4 lb

## 2017-02-02 DIAGNOSIS — H9202 Otalgia, left ear: Secondary | ICD-10-CM

## 2017-02-02 DIAGNOSIS — L03211 Cellulitis of face: Secondary | ICD-10-CM | POA: Diagnosis not present

## 2017-02-02 MED ORDER — OFLOXACIN 0.3 % OT SOLN: 5.0000 [drp] | Freq: Two times a day (BID) | OTIC | 0 refills | Status: DC

## 2017-02-02 MED ORDER — OFLOXACIN 0.3 % OT SOLN
5.0000 [drp] | Freq: Once | OTIC | Status: DC
Start: 2017-02-02 — End: 2017-02-02

## 2017-02-02 NOTE — Patient Instructions (Signed)
Otitis Externa Otitis externa is an infection of the outer ear canal. The outer ear canal is the area between the outside of the ear and the eardrum. Otitis externa is sometimes called "swimmer's ear." Follow these instructions at home:  If you were given antibiotic ear drops, use them as told by your doctor. Do not stop using them even if your condition gets better.  Take over-the-counter and prescription medicines only as told by your doctor.  Keep all follow-up visits as told by your doctor. This is important. How is this prevented?  Keep your ear dry. Use the corner of a towel to dry your ear after you swim or bathe.  Try not to scratch or put things in your ear. Doing these things makes it easier for germs to grow in your ear.  Avoid swimming in lakes, dirty water, or pools that may not have the right amount of a chemical called chlorine.  Consider making ear drops and putting 3 or 4 drops in each ear after you swim. Ask your doctor about how you can make ear drops. Contact a doctor if:  You have a fever.  After 3 days your ear is still red, swollen, or painful.  After 3 days you still have pus coming from your ear.  Your redness, swelling, or pain gets worse.  You have a really bad headache.  You have redness, swelling, pain, or tenderness behind your ear. This information is not intended to replace advice given to you by your health care provider. Make sure you discuss any questions you have with your health care provider. Document Released: 01/03/2008 Document Revised: 08/12/2015 Document Reviewed: 04/26/2015 Elsevier Interactive Patient Education  2018 Elsevier Inc.  

## 2017-02-02 NOTE — Progress Notes (Signed)
[facility] called with RX

## 2017-02-02 NOTE — Progress Notes (Signed)
Subjective:     Patient ID: Sarah Garrett, female   DOB: 1961/03/09, 56 y.o.   MRN: [id number]  HPI Pt with recent tick bite and started on Doxycycline Now with erythem above the L eye and sl L ear pain No other meds used   Review of Systems  Constitutional: Negative.   HENT: Positive for ear pain. Negative for congestion, ear discharge, postnasal drip, rhinorrhea, sinus pain, sinus pressure and sore throat.   Skin: Positive for color change and rash.       Objective:   Physical Exam  Constitutional: She appears well-developed and well-nourished.  HENT:  Right Ear: External ear normal.  Left Ear: External ear normal.  Mouth/Throat: Oropharynx is clear and moist. No oropharyngeal exudate.  + erythema and edema to the L ear canal but is still patent, TM with nl landmarks + TTP of tragus  Neck: Neck supple.  Lymphadenopathy:    She has no cervical adenopathy.  Skin:  + eythema and edema just above the medial L eyebrow No vesicles or ulcerations seen No lid edema No area of flut noted  Nursing note and vitals reviewed.      Assessment:     1. Left ear pain   2. Cellulitis diffuse, face        Plan:     Continue with Doxycycline and discussed this should cover the facial area Floxin Otic for the ear She has Cipro as allergy but does not remember if she had rash or not Keep canal clean and dry Discussed S/S of shingles and told to f/u immed if the present

## 2017-02-02 NOTE — Telephone Encounter (Signed)
TAKEN CARE OF - Chanda called pt

## 2017-02-05 ENCOUNTER — Ambulatory Visit (INDEPENDENT_AMBULATORY_CARE_PROVIDER_SITE_OTHER): Payer: BLUE CROSS/BLUE SHIELD | Admitting: Pediatrics

## 2017-02-05 ENCOUNTER — Telehealth: Payer: Self-pay | Admitting: Pediatrics

## 2017-02-05 ENCOUNTER — Encounter: Payer: Self-pay | Admitting: Pediatrics

## 2017-02-05 VITALS — BP 108/69 | HR 92 | Temp 98.7°F | Ht 63.0 in | Wt 202.4 lb

## 2017-02-05 DIAGNOSIS — L0390 Cellulitis, unspecified: Secondary | ICD-10-CM

## 2017-02-05 DIAGNOSIS — B029 Zoster without complications: Secondary | ICD-10-CM | POA: Diagnosis not present

## 2017-02-05 MED ORDER — VALACYCLOVIR HCL 1 G PO TABS: 1000.0000 mg | ORAL_TABLET | Freq: Two times a day (BID) | ORAL | 0 refills | Status: DC

## 2017-02-05 MED ORDER — CEPHALEXIN 500 MG PO CAPS: 500.0000 mg | ORAL_CAPSULE | Freq: Four times a day (QID) | ORAL | 0 refills | Status: AC

## 2017-02-05 NOTE — Progress Notes (Signed)
  Subjective:   Patient ID: Sarah Garrett, female    DOB: [birth year], 56 y.o.   MRN: [id number] CC: Facial Swelling (Left ) and Insect Bite (left eye)  HPI: Sarah Garrett is a 56 y.o. female presenting for Facial Swelling (Left ) and Insect Bite (left eye)  Noticed it 5 days ago Seen 3 days ago, treated with floxacin for otitis externa Ear pain improved some Yesterday swelling over eye was worse Has been on doxycycline for apprx 7-10 days for recent tick bite, has 14 day course prescribed, is still taking the medicine  Has some pain with red bump in eye brow No itching, no vesicles Has a scab over bump in R eye brow  Says yesterday had so much swelling in L eye lid couldn't open eye all the way slighlty improved today No change in vision.  Relevant past medical, surgical, family and social history reviewed. Allergies and medications reviewed and updated. History  Smoking Status  . Former Smoker  . Packs/day: 0.25  . Years: 3.00  . Types: Cigarettes  . Start date: 08/16/1974  . Quit date: 01/14/1978  Smokeless Tobacco  . Never Used    Comment: quit 37 years ago   ROS: Per HPI   Objective:    BP 108/69   Pulse 92   Temp 98.7 F (37.1 C) (Oral)   Ht 5\' 3"  (1.6 m)   Wt 202 lb 6.4 oz (91.8 kg)   LMP 11/29/2011   BMI 35.85 kg/m   Wt Readings from Last 3 Encounters:  02/05/17 202 lb 6.4 oz (91.8 kg)  02/02/17 202 lb 6.4 oz (91.8 kg)  01/26/17 203 lb (92.1 kg)    Gen: NAD, alert, cooperative with exam, NCAT EYES: EOMI, no pain with EOM. no conjunctival injection. L eye lid slightly red and puffy compared with R ENT:  TMs injected b/l, slightly opaque b/l, OP without erythema LYMPH: slight ttp R ant small LN, no redness, no swelling.  CV: NRRR, normal S1/S2, no murmur, distal pulses 2+ b/l Resp: CTABL, no wheezes, normal WOB Ext: No edema, warm Neuro: Alert and oriented Skin: red, swelling L medial eyebrow, extending to hairline, doesn't cross midline ttp over  eye brow No vesicles, has scabbed over apprx 62mm lesion medial eyebrow  Assessment & Plan:  Khaya was seen today for facial swelling and insect bite.  Diagnoses and all orders for this visit:  Cellulitis, unspecified cellulitis site Cont doxycycline Start below -     cephALEXin (KEFLEX) 500 MG capsule; Take 1 capsule (500 mg total) by mouth 4 (four) times daily.  Herpes zoster without complication Symptoms present for more than 48h but given trigeminal involvement will go ahead and treat with valtrex Any eye pain, eye redness, worsening symptoms let m eknow, needs to see eye doctor. Discussed with pt and husband. -     valACYclovir (VALTREX) 1000 MG tablet; Take 1 tablet (1,000 mg total) by mouth 2 (two) times daily.   Follow up plan: Return if symptoms worsen or fail to improve. Assunta Found, MD [place]

## 2017-02-05 NOTE — Telephone Encounter (Signed)
Pt wanted to clarify that she was to continue doxycycline as well as start new abx. Per OV notes from today she is to continue doxycycline.

## 2017-02-27 DIAGNOSIS — J40 Bronchitis, not specified as acute or chronic: Secondary | ICD-10-CM | POA: Diagnosis not present

## 2017-03-08 ENCOUNTER — Other Ambulatory Visit: Payer: Self-pay | Admitting: Family Medicine

## 2017-03-28 ENCOUNTER — Telehealth: Payer: Self-pay | Admitting: Family Medicine

## 2017-03-28 NOTE — Telephone Encounter (Signed)
Patient called and is going to [place] this weekend and needs to bring along her nebulizer machine and meds to be used in it. Worried that she will have a hard time getting through security. Advised that we will write and letter that she can carry along with her. Patient verbalized understanding. Will leave up front for patient to pick up

## 2017-03-29 ENCOUNTER — Other Ambulatory Visit: Payer: Self-pay | Admitting: Family Medicine

## 2017-03-30 ENCOUNTER — Telehealth: Payer: Self-pay | Admitting: Family Medicine

## 2017-03-30 NOTE — Telephone Encounter (Signed)
Refill sent to pharmacy.   

## 2017-04-27 ENCOUNTER — Other Ambulatory Visit: Payer: Self-pay | Admitting: Family Medicine

## 2017-04-27 NOTE — Telephone Encounter (Signed)
Last seen 02/05/17 Dr Evette Doffing  [place] PCP  Last thyroid 03/27/16

## 2017-04-27 NOTE — Telephone Encounter (Signed)
Authorize 30 days only. Then contact the patient letting them know that they will need an appointment before any further prescriptions can be sent in. 

## 2017-05-08 ENCOUNTER — Other Ambulatory Visit: Payer: Self-pay | Admitting: Family Medicine

## 2017-05-09 NOTE — Telephone Encounter (Signed)
Last thyroid 03/27/16  [facility]

## 2017-05-09 NOTE — Telephone Encounter (Signed)
Last refill without being seen 

## 2017-05-23 ENCOUNTER — Other Ambulatory Visit: Payer: Self-pay | Admitting: Family Medicine

## 2017-06-04 ENCOUNTER — Other Ambulatory Visit: Payer: Self-pay | Admitting: Family Medicine

## 2017-06-04 NOTE — Telephone Encounter (Signed)
Last thyroid 8 28/17

## 2017-06-18 ENCOUNTER — Other Ambulatory Visit: Payer: Self-pay

## 2017-06-18 MED ORDER — FLUTICASONE-SALMETEROL 100-50 MCG/DOSE IN AEPB: 1.0000 | INHALATION_SPRAY | Freq: Two times a day (BID) | RESPIRATORY_TRACT | 0 refills | Status: DC

## 2017-06-20 ENCOUNTER — Ambulatory Visit: Payer: BLUE CROSS/BLUE SHIELD | Admitting: Family Medicine

## 2017-06-20 ENCOUNTER — Encounter: Payer: Self-pay | Admitting: Family Medicine

## 2017-06-20 VITALS — BP 130/89 | HR 88 | Temp 97.1°F | Ht 63.0 in | Wt 199.0 lb

## 2017-06-20 DIAGNOSIS — H8111 Benign paroxysmal vertigo, right ear: Secondary | ICD-10-CM

## 2017-06-20 MED ORDER — MECLIZINE HCL 25 MG PO TABS: 25.0000 mg | ORAL_TABLET | Freq: Three times a day (TID) | ORAL | 0 refills | Status: DC | PRN

## 2017-06-20 NOTE — Progress Notes (Signed)
BP 130/89   Pulse 88   Temp (!) 97.1 F (36.2 C) (Oral)   Ht 5\' 3"  (1.6 m)   Wt 199 lb (90.3 kg)   LMP 11/29/2011   BMI 35.25 kg/m    Subjective:    Patient ID: Sarah Garrett, female    DOB: 1961/01/26, 56 y.o.   MRN: [id number]  HPI: Sarah Garrett is a 56 y.o. female presenting on 06/20/2017 for Dizziness   HPI dizziness and spinning sensation Patient started having the dizziness and spinning 1 week ago and it lasted for 3 days and then it went away and then it came back this morning.  She also complains of some right ear pressure but denies any sinus congestion or sinus drainage or sinus pressure.  She also thinks the right ear pressure just came on afterwards when she was starting to feel little nauseous.  She denies any vomiting from it.  She says it will come in waves when it feels worse than will feel better.  She denies any numbness or weakness or visual deficits or changes in taste or smell.  Relevant past medical, surgical, family and social history reviewed and updated as indicated. Interim medical history since our last visit reviewed. Allergies and medications reviewed and updated.  Review of Systems  Constitutional: Negative for chills and fever.  HENT: Positive for ear pain. Negative for congestion, ear discharge, sinus pressure, sinus pain and sneezing.   Eyes: Negative for redness and visual disturbance.  Respiratory: Negative for chest tightness and shortness of breath.   Cardiovascular: Negative for chest pain and leg swelling.  Gastrointestinal: Positive for nausea. Negative for constipation and vomiting.  Genitourinary: Negative for difficulty urinating and dysuria.  Musculoskeletal: Negative for back pain and gait problem.  Skin: Negative for rash.  Neurological: Positive for dizziness. Negative for weakness, light-headedness, numbness and headaches.  Psychiatric/Behavioral: Negative for agitation and behavioral problems.  All other systems reviewed  and are negative.   Per HPI unless specifically indicated above     Objective:    BP 130/89   Pulse 88   Temp (!) 97.1 F (36.2 C) (Oral)   Ht 5\' 3"  (1.6 m)   Wt 199 lb (90.3 kg)   LMP 11/29/2011   BMI 35.25 kg/m   Wt Readings from Last 3 Encounters:  06/20/17 199 lb (90.3 kg)  02/05/17 202 lb 6.4 oz (91.8 kg)  02/02/17 202 lb 6.4 oz (91.8 kg)    Physical Exam  Constitutional: She is oriented to person, place, and time. She appears well-developed and well-nourished. No distress.  HENT:  Right Ear: External ear normal.  Left Ear: External ear normal.  Nose: Nose normal.  Mouth/Throat: Oropharynx is clear and moist. No oropharyngeal exudate.  Eyes: Conjunctivae are normal.  Neck: Neck supple. No thyromegaly present.  Cardiovascular: Normal rate, regular rhythm, normal heart sounds and intact distal pulses.  No murmur heard. Pulmonary/Chest: Effort normal and breath sounds normal. No respiratory distress. She has no wheezes.  Musculoskeletal: Normal range of motion. She exhibits no edema.  Lymphadenopathy:    She has no cervical adenopathy.  Neurological: She is alert and oriented to person, place, and time. She displays normal reflexes. No cranial nerve deficit or sensory deficit. She exhibits normal muscle tone. Coordination normal.  Skin: Skin is warm and dry. No rash noted. She is not diaphoretic.  Psychiatric: She has a normal mood and affect. Her behavior is normal.  Nursing note and vitals reviewed.  Assessment & Plan:   Problem List Items Addressed This Visit    None    Visit Diagnoses    Benign paroxysmal positional vertigo of right ear    -  Primary   Relevant Medications   meclizine (ANTIVERT) 25 MG tablet    Gave education on vertigo and handout for BPPV, if does not improve or continues to be an issue than may consider ENT referral  Follow up plan: Return if symptoms worsen or fail to improve.  Counseling provided for all of the vaccine  components No orders of the defined types were placed in this encounter.   Caryl Pina, MD [facility] 06/20/2017, 5:37 PM

## 2017-06-25 DIAGNOSIS — L570 Actinic keratosis: Secondary | ICD-10-CM | POA: Diagnosis not present

## 2017-06-25 DIAGNOSIS — Z85820 Personal history of malignant melanoma of skin: Secondary | ICD-10-CM | POA: Diagnosis not present

## 2017-06-25 DIAGNOSIS — D225 Melanocytic nevi of trunk: Secondary | ICD-10-CM | POA: Diagnosis not present

## 2017-06-25 DIAGNOSIS — L814 Other melanin hyperpigmentation: Secondary | ICD-10-CM | POA: Diagnosis not present

## 2017-06-25 DIAGNOSIS — L821 Other seborrheic keratosis: Secondary | ICD-10-CM | POA: Diagnosis not present

## 2017-06-28 ENCOUNTER — Other Ambulatory Visit: Payer: Self-pay | Admitting: *Deleted

## 2017-06-28 MED ORDER — FUROSEMIDE 40 MG PO TABS
ORAL_TABLET | ORAL | 0 refills | Status: DC
Start: 2017-06-28 — End: 2017-11-04

## 2017-06-28 MED ORDER — MONTELUKAST SODIUM 10 MG PO TABS: ORAL_TABLET | ORAL | 0 refills | Status: DC

## 2017-06-28 NOTE — Addendum Note (Signed)
Addended by: Antonietta Barcelona D on: 06/28/2017 12:35 PM   Modules accepted: Orders

## 2017-07-17 ENCOUNTER — Other Ambulatory Visit: Payer: Self-pay | Admitting: Family Medicine

## 2017-07-20 ENCOUNTER — Ambulatory Visit (INDEPENDENT_AMBULATORY_CARE_PROVIDER_SITE_OTHER): Payer: BLUE CROSS/BLUE SHIELD

## 2017-07-20 DIAGNOSIS — Z23 Encounter for immunization: Secondary | ICD-10-CM | POA: Diagnosis not present

## 2017-07-31 ENCOUNTER — Other Ambulatory Visit: Payer: Self-pay | Admitting: Family Medicine

## 2017-08-01 NOTE — Telephone Encounter (Signed)
Last thyroid 03/27/16

## 2017-08-01 NOTE — Telephone Encounter (Signed)
This is okay to refill, but make sure that she gets a thyroid profile to confirm that this is still the correct dose.

## 2017-08-03 ENCOUNTER — Other Ambulatory Visit: Payer: BLUE CROSS/BLUE SHIELD

## 2017-08-03 ENCOUNTER — Ambulatory Visit (HOSPITAL_BASED_OUTPATIENT_CLINIC_OR_DEPARTMENT_OTHER): Payer: BLUE CROSS/BLUE SHIELD | Admitting: Hematology & Oncology

## 2017-08-03 ENCOUNTER — Other Ambulatory Visit: Payer: Self-pay

## 2017-08-03 ENCOUNTER — Encounter: Payer: Self-pay | Admitting: Hematology & Oncology

## 2017-08-03 VITALS — BP 128/81 | HR 79 | Temp 97.9°F | Resp 20 | Wt 201.8 lb

## 2017-08-03 DIAGNOSIS — C4360 Malignant melanoma of unspecified upper limb, including shoulder: Secondary | ICD-10-CM

## 2017-08-03 DIAGNOSIS — A6920 Lyme disease, unspecified: Secondary | ICD-10-CM

## 2017-08-03 LAB — CMP (CANCER CENTER ONLY)
ALT(SGPT): 39 U/L (ref 10–47)
AST: 32 U/L (ref 11–38)
Albumin: 4.2 g/dL (ref 3.3–5.5)
Alkaline Phosphatase: 84 U/L (ref 26–84)
BUN, Bld: 9 mg/dL (ref 7–22)
CALCIUM: 10.3 mg/dL (ref 8.0–10.3)
CO2: 31 meq/L (ref 18–33)
Chloride: 102 mEq/L (ref 98–108)
Creat: 0.8 mg/dl (ref 0.6–1.2)
GLUCOSE: 103 mg/dL (ref 73–118)
POTASSIUM: 3.6 meq/L (ref 3.3–4.7)
Sodium: 145 mEq/L (ref 128–145)
Total Bilirubin: 0.8 mg/dl (ref 0.20–1.60)
Total Protein: 7.7 g/dL (ref 6.4–8.1)

## 2017-08-03 LAB — CBC WITH DIFFERENTIAL (CANCER CENTER ONLY)
BASO#: 0 10*3/uL (ref 0.0–0.2)
BASO%: 0.2 % (ref 0.0–2.0)
EOS%: 5.3 % (ref 0.0–7.0)
Eosinophils Absolute: 0.4 10*3/uL (ref 0.0–0.5)
HEMATOCRIT: 44.8 % (ref 34.8–46.6)
HGB: 15.8 g/dL (ref 11.6–15.9)
LYMPH#: 2.5 10*3/uL (ref 0.9–3.3)
LYMPH%: 31.4 % (ref 14.0–48.0)
MCH: 31.3 pg (ref 26.0–34.0)
MCHC: 35.3 g/dL (ref 32.0–36.0)
MCV: 89 fL (ref 81–101)
MONO#: 0.4 10*3/uL (ref 0.1–0.9)
MONO%: 4.8 % (ref 0.0–13.0)
NEUT#: 4.7 10*3/uL (ref 1.5–6.5)
NEUT%: 58.3 % (ref 39.6–80.0)
Platelets: 180 10*3/uL (ref 145–400)
RBC: 5.04 10*6/uL (ref 3.70–5.32)
RDW: 14.1 % (ref 11.1–15.7)
WBC: 8.1 10*3/uL (ref 3.9–10.0)

## 2017-08-03 LAB — IRON AND TIBC
%SAT: 29 % (ref 21–57)
Iron: 98 ug/dL (ref 41–142)
TIBC: 334 ug/dL (ref 236–444)
UIBC: 235 ug/dL (ref 120–384)

## 2017-08-03 LAB — FERRITIN: Ferritin: 66 ng/ml (ref 9–269)
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
## 2017-08-06 ENCOUNTER — Telehealth: Payer: Self-pay | Admitting: *Deleted

## 2017-08-06 NOTE — Telephone Encounter (Addendum)
Patient is aware of results  ----- Message from [name], MD sent at 08/03/2017  4:57 PM EST ----- Call - the iron level is great!!  Ferritin is only 66!!!  Laurey Arrow

## 2017-08-08 ENCOUNTER — Ambulatory Visit: Payer: BLUE CROSS/BLUE SHIELD | Admitting: Family Medicine

## 2017-08-24 ENCOUNTER — Other Ambulatory Visit: Payer: BLUE CROSS/BLUE SHIELD

## 2017-08-24 DIAGNOSIS — D508 Other iron deficiency anemias: Secondary | ICD-10-CM | POA: Diagnosis not present

## 2017-08-24 DIAGNOSIS — I10 Essential (primary) hypertension: Secondary | ICD-10-CM

## 2017-08-24 DIAGNOSIS — K219 Gastro-esophageal reflux disease without esophagitis: Secondary | ICD-10-CM

## 2017-08-24 DIAGNOSIS — E7849 Other hyperlipidemia: Secondary | ICD-10-CM

## 2017-08-24 DIAGNOSIS — E038 Other specified hypothyroidism: Secondary | ICD-10-CM | POA: Diagnosis not present

## 2017-08-25 LAB — LIPID PANEL
CHOL/HDL RATIO: 3.3 ratio (ref 0.0–4.4)
Cholesterol, Total: 144 mg/dL (ref 100–199)
HDL: 44 mg/dL (ref >39)
LDL CALC: 69 mg/dL (ref 0–99)
Triglycerides: 153 mg/dL — ABNORMAL HIGH (ref 0–149)
VLDL Cholesterol Cal: 31 mg/dL (ref 5–40)

## 2017-08-25 LAB — CBC WITH DIFFERENTIAL/PLATELET
BASOS ABS: 0 10*3/uL (ref 0.0–0.2)
Basos: 1 %
EOS (ABSOLUTE): 0.5 10*3/uL — AB (ref 0.0–0.4)
Eos: 6 %
Hematocrit: 41.1 % (ref 34.0–46.6)
Hemoglobin: 13.8 g/dL (ref 11.1–15.9)
IMMATURE GRANULOCYTES: 0 %
Immature Grans (Abs): 0 10*3/uL (ref 0.0–0.1)
LYMPHS: 32 %
Lymphocytes Absolute: 2.4 10*3/uL (ref 0.7–3.1)
MCH: 30 pg (ref 26.6–33.0)
MCHC: 33.6 g/dL (ref 31.5–35.7)
MCV: 89 fL (ref 79–97)
MONOS ABS: 0.4 10*3/uL (ref 0.1–0.9)
Monocytes: 6 %
NEUTROS PCT: 55 %
Neutrophils Absolute: 4.1 10*3/uL (ref 1.4–7.0)
PLATELETS: 175 10*3/uL (ref 150–379)
RBC: 4.6 x10E6/uL (ref 3.77–5.28)
RDW: 15 % (ref 12.3–15.4)
WBC: 7.5 10*3/uL (ref 3.4–10.8)

## 2017-08-25 LAB — BASIC METABOLIC PANEL
BUN / CREAT RATIO: 15 (ref 9–23)
BUN: 13 mg/dL (ref 6–24)
CALCIUM: 9.5 mg/dL (ref 8.7–10.2)
CO2: 27 mmol/L (ref 20–29)
Chloride: 100 mmol/L (ref 96–106)
Creatinine, Ser: 0.89 mg/dL (ref 0.57–1.00)
GFR calc Af Amer: 83 mL/min/{1.73_m2} (ref >59)
GFR calc non Af Amer: 72 mL/min/{1.73_m2} (ref >59)
GLUCOSE: 99 mg/dL (ref 65–99)
POTASSIUM: 3.7 mmol/L (ref 3.5–5.2)
Sodium: 143 mmol/L (ref 134–144)

## 2017-08-25 LAB — HEPATIC FUNCTION PANEL
ALT: 20 IU/L (ref 0–32)
AST: 22 IU/L (ref 0–40)
Albumin: 4.5 g/dL (ref 3.5–5.5)
Alkaline Phosphatase: 86 IU/L (ref 39–117)
Bilirubin Total: 0.4 mg/dL (ref 0.0–1.2)
Bilirubin, Direct: 0.13 mg/dL (ref 0.00–0.40)
TOTAL PROTEIN: 7 g/dL (ref 6.0–8.5)

## 2017-08-25 LAB — THYROID PANEL WITH TSH
Free Thyroxine Index: 2 (ref 1.2–4.9)
T3 UPTAKE RATIO: 25 % (ref 24–39)
T4, Total: 8.1 ug/dL (ref 4.5–12.0)
TSH: 0.882 u[IU]/mL (ref 0.450–4.500)

## 2017-08-25 NOTE — Progress Notes (Signed)
Subjective:    Patient ID: Sarah Garrett, female    DOB: 28-Dec-1960, 57 y.o.   MRN: [id number]  HPI Pt here for follow up and management of chronic medical problems which include hyperlipidemia, hypertension, GERD, hypothyroidism and Vit D deficiency.  The patient today is requesting refills on all of her medicines.  She does complain of fatigue today.  She is due to have a DEXA scan mammogram and a pelvic exam.  She has had her flu shot and a chest x-ray in June of this past year.  She has had lab work done and this will be reviewed with her during the visit today.  She does have hemochromatosis.  She sees the hematologist regularly for this.  Lab work today had a normal thyroid profile and the patient has been instructed that she does not need any thyroid other than what she is currently taking.  The CBC is within normal limits with a good hemoglobin at 13.8 though slightly decreased from the past.  Platelet count was adequate.  Blood sugar was good at 99 and the creatinine and all of the electrolytes including potassium were good.  All liver function tests were normal.  Triglycerides were slightly elevated on the cholesterol panel but the LDL C cholesterol was excellent at 69 and HDL was good at 44.  Patient has the FOBT at home and will try to return this.  She knows that she is due to have a DEXA scan mammogram and Pap smear with pelvic exam.  She feels like that the fatigue that she is having is more related to all the work that she has to do.  She has her daytime work and her home work and looking after her children husband and mother.  All of her lab work was basically good other than the elevated triglycerides.  There is a family history of heart disease.  The patient denies any chest pain or shortness of breath anymore than usual.  Her breathing has been stable she has a history of asthmatic bronchitis.  She denies any trouble with swallowing heartburn indigestion nausea vomiting diarrhea blood  in the stool or black tarry bowel movements.  She is passing her water well.  She is somewhat concerned that she has had more edema recently even though she is still taking the same fluid pill.  She is only taking 20 mg daily.  She will up the dose and take 1 whole 1 or 40 mg daily for the next 4 days and then reduce that back to 40 on Monday Wednesday and Friday and a half of one the other days.  She will get back in touch with [place] after doing that to see if it helps control the additional swelling.  She also says that she is watching her sodium intake closely.  She just recently saw the hematologist and he was pleased with everything as far as her hemochromatosis is concerned.  She sees him regularly.   Review of Systems  Constitutional: Positive for fatigue.  Respiratory: Positive for shortness of breath (occasional).   Cardiovascular: Positive for leg swelling (occasional).  Genitourinary: Negative.   Neurological: Negative.        Objective:   Physical Exam  Constitutional: She is oriented to person, place, and time. She appears well-developed and well-nourished. No distress.  Patient is pleasant and alert and busy  HENT:  Head: Normocephalic and atraumatic.  Right Ear: External ear normal.  Left Ear: External ear normal.  Nose:  Nose normal.  Mouth/Throat: Oropharynx is clear and moist. No oropharyngeal exudate.  Eyes: Conjunctivae and EOM are normal. Pupils are equal, round, and reactive to light. Right eye exhibits no discharge. Left eye exhibits no discharge. No scleral icterus.  Neck: Normal range of motion. Neck supple. No thyromegaly present.  No bruits thyromegaly or anterior cervical adenopathy  Cardiovascular: Normal rate, regular rhythm, normal heart sounds and intact distal pulses.  No murmur heard. The heart has a regular rate and rhythm at 84/min  Pulmonary/Chest: Effort normal and breath sounds normal. No respiratory distress. She has no wheezes. She has no rales.    Clear anteriorly and posteriorly with no wheezes or rales  Abdominal: Soft. Bowel sounds are normal. She exhibits no mass. There is no tenderness. There is no rebound and no guarding.  No liver or spleen enlargement no bruits and no suprapubic tenderness  Musculoskeletal: Normal range of motion. She exhibits no edema.  Lymphadenopathy:    She has no cervical adenopathy.  Neurological: She is alert and oriented to person, place, and time. She has normal reflexes. No cranial nerve deficit.  Skin: Skin is warm and dry. No rash noted.  Psychiatric: She has a normal mood and affect. Her behavior is normal. Judgment and thought content normal.  Nursing note and vitals reviewed.  BP 123/86 (BP Location: Left Arm, Patient Position: Sitting, Cuff Size: Normal)   Pulse 93   Temp (!) 97.1 F (36.2 C) (Oral)   Ht 5\' 3"  (1.6 m)   Wt 205 lb 12.8 oz (93.4 kg)   LMP 11/29/2011   BMI 36.46 kg/m   Because patient has been having more edema, we will increase her Lasix to a whole one once daily for 4 days and then back to 1 whole pill on Monday Wednesday and Friday and a half or one all other days.      Assessment & Plan:  1. Essential hypertension -Blood pressure is good today and she will continue with current treatment  2. Pure hypercholesterolemia -Continue with Crestor and try to add Vascepa to current statin treatment  3. Gastroesophageal reflux disease, esophagitis presence not specified -No complaints today with reflux  4. Vitamin D deficiency -Continue vitamin D replacement   5. Chronic bronchitis, unspecified chronic bronchitis type ([place]) -This is been stable and patient continues to use Advair and albuterol as a rescue inhaler  6. Melanoma of upper arm, right ([place]) -Follow-up with dermatology as planned  7. Hereditary hemochromatosis ([place]) -Follow-up with hematology as planned  8. Malignant melanoma of upper arm, unspecified laterality ([place]) -Follow-up with dermatology  9.  Hypothyroidism due to acquired atrophy of thyroid -Continue current treatment  10. Hypertriglyceridemia -Add Vascepa 2 twice daily  Meds ordered this encounter  Medications  . esomeprazole (NEXIUM) 40 MG capsule    Sig: Take 1 capsule (40 mg total) by mouth as needed.    Dispense:  30 capsule    Refill:  6  . Fluticasone-Salmeterol (ADVAIR DISKUS) 100-50 MCG/DOSE AEPB    Sig: Inhale 1 puff into the lungs 2 (two) times daily.    Dispense:  180 each    Refill:  0  . hyoscyamine (LEVSIN SL) 0.125 MG SL tablet    Sig: Place 1 tablet (0.125 mg total) under the tongue as needed. Place 1-2 tablet under the tongue until it dissolves as needed    Dispense:  30 tablet    Refill:  2  . levothyroxine (SYNTHROID, LEVOTHROID) 125 MCG tablet  Sig: Take 1 tablet (125 mcg total) by mouth daily.    Dispense:  30 tablet    Refill:  11  . montelukast (SINGULAIR) 10 MG tablet    Sig: TAKE 1 TABLET (10 MG TOTAL) BY MOUTH AT BEDTIME.    Dispense:  30 tablet    Refill:  5  . rosuvastatin (CRESTOR) 20 MG tablet    Sig: TAKE 1 TABLET (20 MG TOTAL) BY MOUTH DAILY.    Dispense:  30 tablet    Refill:  5  . Vitamin D, Ergocalciferol, (DRISDOL) 50000 units CAPS capsule    Sig: Take 1 capsule (50,000 Units total) by mouth every 7 (seven) days.    Dispense:  12 capsule    Refill:  3   Patient Instructions  Continue current medications. Continue good therapeutic lifestyle changes which include good diet and exercise. Fall precautions discussed with patient. If an FOBT was given today- please return it to our front desk. If you are over 9 years old - you may need Prevnar 70 or the adult Pneumonia vaccine.  **Flu shots are available--- please call and schedule a FLU-CLINIC appointment**  After your visit with [place] today you will receive a survey in the mail or online from [employer] regarding your care with [place]. Please take a moment to fill this out. Your feedback is very important to [place] as you can help  [place] better understand your patient needs as well as improve your experience and satisfaction. WE CARE ABOUT YOU!!!   Continue to follow-up with Dr. Marin Olp for the hemochromatosis Is important that you make arrangements to have your mammogram and Pap smears done.  You also will be due to get a DEXA scan.    Arrie Senate MD

## 2017-08-27 ENCOUNTER — Ambulatory Visit: Payer: BLUE CROSS/BLUE SHIELD | Admitting: Family Medicine

## 2017-08-27 ENCOUNTER — Encounter: Payer: Self-pay | Admitting: Family Medicine

## 2017-08-27 VITALS — BP 123/86 | HR 93 | Temp 97.1°F | Ht 63.0 in | Wt 205.8 lb

## 2017-08-27 DIAGNOSIS — C4361 Malignant melanoma of right upper limb, including shoulder: Secondary | ICD-10-CM

## 2017-08-27 DIAGNOSIS — K219 Gastro-esophageal reflux disease without esophagitis: Secondary | ICD-10-CM

## 2017-08-27 DIAGNOSIS — E034 Atrophy of thyroid (acquired): Secondary | ICD-10-CM | POA: Diagnosis not present

## 2017-08-27 DIAGNOSIS — J42 Unspecified chronic bronchitis: Secondary | ICD-10-CM

## 2017-08-27 DIAGNOSIS — E781 Pure hyperglyceridemia: Secondary | ICD-10-CM | POA: Diagnosis not present

## 2017-08-27 DIAGNOSIS — E559 Vitamin D deficiency, unspecified: Secondary | ICD-10-CM

## 2017-08-27 DIAGNOSIS — E7800 Pure hypercholesterolemia, unspecified: Secondary | ICD-10-CM

## 2017-08-27 DIAGNOSIS — I10 Essential (primary) hypertension: Secondary | ICD-10-CM | POA: Diagnosis not present

## 2017-08-27 DIAGNOSIS — C4360 Malignant melanoma of unspecified upper limb, including shoulder: Secondary | ICD-10-CM

## 2017-08-27 MED ORDER — HYOSCYAMINE SULFATE 0.125 MG SL SUBL: 0.1250 mg | SUBLINGUAL_TABLET | SUBLINGUAL | 2 refills | Status: DC | PRN

## 2017-08-27 MED ORDER — MONTELUKAST SODIUM 10 MG PO TABS: ORAL_TABLET | ORAL | 5 refills | Status: DC

## 2017-08-27 MED ORDER — FLUTICASONE-SALMETEROL 100-50 MCG/DOSE IN AEPB: 1.0000 | INHALATION_SPRAY | Freq: Two times a day (BID) | RESPIRATORY_TRACT | 0 refills | Status: DC

## 2017-08-27 MED ORDER — ROSUVASTATIN CALCIUM 20 MG PO TABS: ORAL_TABLET | ORAL | 5 refills | Status: DC

## 2017-08-27 MED ORDER — VITAMIN D (ERGOCALCIFEROL) 1.25 MG (50000 UNIT) PO CAPS: 50000.0000 [IU] | ORAL_CAPSULE | ORAL | 3 refills | Status: DC

## 2017-08-27 MED ORDER — ESOMEPRAZOLE MAGNESIUM 40 MG PO CPDR: 40.0000 mg | DELAYED_RELEASE_CAPSULE | ORAL | 6 refills | Status: DC | PRN

## 2017-08-27 MED ORDER — ICOSAPENT ETHYL 1 G PO CAPS: 2.0000 | ORAL_CAPSULE | Freq: Two times a day (BID) | ORAL | 1 refills | Status: DC

## 2017-08-27 MED ORDER — LEVOTHYROXINE SODIUM 125 MCG PO TABS: 125.0000 ug | ORAL_TABLET | Freq: Every day | ORAL | 11 refills | Status: DC

## 2017-08-27 NOTE — Addendum Note (Signed)
Addended by: Marin Olp on: 08/27/2017 07:21 PM   Modules accepted: Orders

## 2017-08-27 NOTE — Patient Instructions (Addendum)
Continue current medications. Continue good therapeutic lifestyle changes which include good diet and exercise. Fall precautions discussed with patient. If an FOBT was given today- please return it to our front desk. If you are over 59 years old - you may need Prevnar 103 or the adult Pneumonia vaccine.  **Flu shots are available--- please call and schedule a FLU-CLINIC appointment**  After your visit with [place] today you will receive a survey in the mail or online from [employer] regarding your care with [place]. Please take a moment to fill this out. Your feedback is very important to [place] as you can help [place] better understand your patient needs as well as improve your experience and satisfaction. WE CARE ABOUT YOU!!!   Continue to follow-up with Dr. Marin Olp for the hemochromatosis Is important that you make arrangements to have your mammogram and Pap smears done.  You also will be due to get a DEXA scan.

## 2017-09-20 ENCOUNTER — Encounter: Payer: Self-pay | Admitting: Family

## 2017-09-20 ENCOUNTER — Ambulatory Visit: Payer: BLUE CROSS/BLUE SHIELD | Admitting: Family

## 2017-09-20 VITALS — BP 132/92 | HR 88 | Ht 63.0 in | Wt 205.0 lb

## 2017-09-20 DIAGNOSIS — J020 Streptococcal pharyngitis: Secondary | ICD-10-CM

## 2017-09-20 DIAGNOSIS — R6889 Other general symptoms and signs: Secondary | ICD-10-CM | POA: Diagnosis not present

## 2017-09-20 LAB — RAPID STREP SCREEN (MED CTR MEBANE ONLY): STREP GP A AG, IA W/REFLEX: POSITIVE — AB

## 2017-09-20 LAB — VERITOR FLU A/B WAIVED
INFLUENZA A: NEGATIVE
Influenza B: NEGATIVE

## 2017-09-20 MED ORDER — AMOXICILLIN 500 MG PO CAPS: 500.0000 mg | ORAL_CAPSULE | Freq: Two times a day (BID) | ORAL | 0 refills | Status: DC

## 2017-09-20 NOTE — Patient Instructions (Signed)
Strep Throat Strep throat is a bacterial infection of the throat. Your health care provider may call the infection tonsillitis or pharyngitis, depending on whether there is swelling in the tonsils or at the back of the throat. Strep throat is most common during the cold months of the year in children who are 5-59 years of age, but it can happen during any season in people of any age. This infection is spread from person to person (contagious) through coughing, sneezing, or close contact. What are the causes? Strep throat is caused by the bacteria called Streptococcus pyogenes. What increases the risk? This condition is more likely to develop in:  People who spend time in crowded places where the infection can spread easily.  People who have close contact with someone who has strep throat.  What are the signs or symptoms? Symptoms of this condition include:  Fever or chills.  Redness, swelling, or pain in the tonsils or throat.  Pain or difficulty when swallowing.  White or yellow spots on the tonsils or throat.  Swollen, tender glands in the neck or under the jaw.  Red rash all over the body (rare).  How is this diagnosed? This condition is diagnosed by performing a rapid strep test or by taking a swab of your throat (throat culture test). Results from a rapid strep test are usually ready in a few minutes, but throat culture test results are available after one or two days. How is this treated? This condition is treated with antibiotic medicine. Follow these instructions at home: Medicines  Take over-the-counter and prescription medicines only as told by your health care provider.  Take your antibiotic as told by your health care provider. Do not stop taking the antibiotic even if you start to feel better.  Have family members who also have a sore throat or fever tested for strep throat. They may need antibiotics if they have the strep infection. Eating and drinking  Do not  share food, drinking cups, or personal items that could cause the infection to spread to other people.  If swallowing is difficult, try eating soft foods until your sore throat feels better.  Drink enough fluid to keep your urine clear or pale yellow. General instructions  Gargle with a salt-water mixture 3-4 times per day or as needed. To make a salt-water mixture, completely dissolve -1 tsp of salt in 1 cup of warm water.  Make sure that all household members wash their hands well.  Get plenty of rest.  Stay home from school or work until you have been taking antibiotics for 24 hours.  Keep all follow-up visits as told by your health care provider. This is important. Contact a health care provider if:  The glands in your neck continue to get bigger.  You develop a rash, cough, or earache.  You cough up a thick liquid that is green, yellow-brown, or bloody.  You have pain or discomfort that does not get better with medicine.  Your problems seem to be getting worse rather than better.  You have a fever. Get help right away if:  You have new symptoms, such as vomiting, severe headache, stiff or painful neck, chest pain, or shortness of breath.  You have severe throat pain, drooling, or changes in your voice.  You have swelling of the neck, or the skin on the neck becomes red and tender.  You have signs of dehydration, such as fatigue, dry mouth, and decreased urination.  You become increasingly sleepy, or   you cannot wake up completely.  Your joints become red or painful. This information is not intended to replace advice given to you by your health care provider. Make sure you discuss any questions you have with your health care provider. Document Released: 07/14/2000 Document Revised: 03/15/2016 Document Reviewed: 11/09/2014 Elsevier Interactive Patient Education  2018 Elsevier Inc.  

## 2017-09-20 NOTE — Progress Notes (Signed)
   Subjective:    Patient ID: Sarah Garrett, female    DOB: [birth year], 59 y.o.   MRN: [id number]  Sore Throat   This is a new problem. The current episode started in the past 7 days. The problem has been rapidly worsening. Maximum temperature: hot and chills. The pain is at a severity of 2/10. Associated symptoms include congestion, coughing, ear pain, headaches, a hoarse voice, swollen glands and trouble swallowing. She has tried acetaminophen for the symptoms. The treatment provided mild relief.      Review of Systems  HENT: Positive for congestion, ear pain, hoarse voice and trouble swallowing.   Respiratory: Positive for cough.   Neurological: Positive for headaches.  All other systems reviewed and are negative.      Objective:   Physical Exam  Constitutional: She is oriented to person, place, and time. She appears well-developed and well-nourished. No distress.  HENT:  Head: Normocephalic and atraumatic.  Right Ear: Tympanic membrane is bulging.  Left Ear: Tympanic membrane is bulging.  Nose: Mucosal edema and rhinorrhea present. Right sinus exhibits maxillary sinus tenderness. Left sinus exhibits maxillary sinus tenderness.  Mouth/Throat: Posterior oropharyngeal erythema present.  Eyes: Pupils are equal, round, and reactive to light.  Neck: Normal range of motion. Neck supple. No thyromegaly present.  Cardiovascular: Normal rate, regular rhythm, normal heart sounds and intact distal pulses.  No murmur heard. Pulmonary/Chest: Effort normal and breath sounds normal. No respiratory distress. She has no wheezes.  Abdominal: Soft. Bowel sounds are normal. She exhibits no distension. There is no tenderness.  Musculoskeletal: Normal range of motion. She exhibits no edema or tenderness.  Neurological: She is alert and oriented to person, place, and time. She has normal reflexes. No cranial nerve deficit.  Skin: Skin is warm and dry.  Psychiatric: She has a normal mood and  affect. Her behavior is normal. Judgment and thought content normal.  Vitals reviewed.     BP (!) 132/92   Pulse 88   Ht 5\' 3"  (1.6 m)   Wt 205 lb (93 kg)   LMP 11/29/2011   BMI 36.31 kg/m      Assessment & Plan:  1. Flu-like symptoms - Veritor Flu A/B Waived - Rapid Strep Screen (Not at [facility])  2. Strep throat - Take meds as prescribed - Use a cool mist humidifier  -Use saline nose sprays frequently -Force fluids -For any cough or congestion  Use plain Mucinex- regular strength or max strength is fine -For fever or aces or pains- take tylenol or ibuprofen. -Throat lozenges if help -New toothbrush in 3 days - amoxicillin (AMOXIL) 500 MG capsule; Take 1 capsule (500 mg total) by mouth 2 (two) times daily.  Dispense: 20 capsule; Refill: 0    Evelina Dun, FNP

## 2017-09-29 ENCOUNTER — Other Ambulatory Visit: Payer: Self-pay | Admitting: Family Medicine

## 2017-10-24 ENCOUNTER — Other Ambulatory Visit: Payer: Self-pay | Admitting: Family Medicine

## 2017-11-04 ENCOUNTER — Other Ambulatory Visit: Payer: Self-pay | Admitting: Family Medicine

## 2017-12-10 ENCOUNTER — Other Ambulatory Visit: Payer: Self-pay | Admitting: Family Medicine

## 2017-12-20 ENCOUNTER — Other Ambulatory Visit: Payer: BLUE CROSS/BLUE SHIELD

## 2017-12-20 DIAGNOSIS — I10 Essential (primary) hypertension: Secondary | ICD-10-CM

## 2017-12-20 DIAGNOSIS — E7800 Pure hypercholesterolemia, unspecified: Secondary | ICD-10-CM | POA: Diagnosis not present

## 2017-12-21 LAB — CBC WITH DIFFERENTIAL/PLATELET
BASOS ABS: 0 10*3/uL (ref 0.0–0.2)
Basos: 0 %
EOS (ABSOLUTE): 0.4 10*3/uL (ref 0.0–0.4)
Eos: 5 %
HEMOGLOBIN: 16.1 g/dL — AB (ref 11.1–15.9)
Hematocrit: 47.6 % — ABNORMAL HIGH (ref 34.0–46.6)
Immature Grans (Abs): 0 10*3/uL (ref 0.0–0.1)
Immature Granulocytes: 0 %
LYMPHS ABS: 2.9 10*3/uL (ref 0.7–3.1)
Lymphs: 35 %
MCH: 30.8 pg (ref 26.6–33.0)
MCHC: 33.8 g/dL (ref 31.5–35.7)
MCV: 91 fL (ref 79–97)
MONOCYTES: 5 %
Monocytes Absolute: 0.5 10*3/uL (ref 0.1–0.9)
Neutrophils Absolute: 4.6 10*3/uL (ref 1.4–7.0)
Neutrophils: 55 %
PLATELETS: 184 10*3/uL (ref 150–450)
RBC: 5.23 x10E6/uL (ref 3.77–5.28)
RDW: 14.8 % (ref 12.3–15.4)
WBC: 8.4 10*3/uL (ref 3.4–10.8)

## 2017-12-21 LAB — HEPATIC FUNCTION PANEL
ALBUMIN: 4.7 g/dL (ref 3.5–5.5)
ALK PHOS: 82 IU/L (ref 39–117)
ALT: 26 IU/L (ref 0–32)
AST: 27 IU/L (ref 0–40)
BILIRUBIN TOTAL: 0.5 mg/dL (ref 0.0–1.2)
BILIRUBIN, DIRECT: 0.15 mg/dL (ref 0.00–0.40)
TOTAL PROTEIN: 7.3 g/dL (ref 6.0–8.5)

## 2017-12-21 LAB — BMP8+EGFR
BUN/Creatinine Ratio: 13 (ref 9–23)
BUN: 12 mg/dL (ref 6–24)
CALCIUM: 10.1 mg/dL (ref 8.7–10.2)
CO2: 28 mmol/L (ref 20–29)
CREATININE: 0.92 mg/dL (ref 0.57–1.00)
Chloride: 99 mmol/L (ref 96–106)
GFR calc Af Amer: 79 mL/min/{1.73_m2} (ref >59)
GFR, EST NON AFRICAN AMERICAN: 68 mL/min/{1.73_m2} (ref >59)
GLUCOSE: 95 mg/dL (ref 65–99)
Potassium: 3.7 mmol/L (ref 3.5–5.2)
Sodium: 144 mmol/L (ref 134–144)

## 2017-12-21 LAB — LIPID PANEL
CHOLESTEROL TOTAL: 145 mg/dL (ref 100–199)
Chol/HDL Ratio: 3.3 ratio (ref 0.0–4.4)
HDL: 44 mg/dL (ref >39)
LDL CALC: 70 mg/dL (ref 0–99)
TRIGLYCERIDES: 156 mg/dL — AB (ref 0–149)
VLDL Cholesterol Cal: 31 mg/dL (ref 5–40)

## 2017-12-25 ENCOUNTER — Telehealth: Payer: Self-pay | Admitting: Family Medicine

## 2017-12-25 NOTE — Telephone Encounter (Signed)
Pt aware of labs - thyroid labs added

## 2017-12-26 LAB — SPECIMEN STATUS REPORT

## 2017-12-26 LAB — THYROID PANEL WITH TSH
FREE THYROXINE INDEX: 3 (ref 1.2–4.9)
T3 Uptake Ratio: 26 % (ref 24–39)
T4, Total: 11.4 ug/dL (ref 4.5–12.0)
TSH: 0.545 u[IU]/mL (ref 0.450–4.500)

## 2017-12-27 ENCOUNTER — Ambulatory Visit: Payer: BLUE CROSS/BLUE SHIELD | Admitting: Family Medicine

## 2017-12-28 ENCOUNTER — Ambulatory Visit: Payer: BLUE CROSS/BLUE SHIELD | Admitting: Family Medicine

## 2017-12-28 ENCOUNTER — Encounter: Payer: Self-pay | Admitting: Family Medicine

## 2017-12-28 VITALS — BP 105/69 | HR 95 | Temp 98.7°F | Ht 63.0 in | Wt 207.0 lb

## 2017-12-28 DIAGNOSIS — I10 Essential (primary) hypertension: Secondary | ICD-10-CM | POA: Diagnosis not present

## 2017-12-28 DIAGNOSIS — J9801 Acute bronchospasm: Secondary | ICD-10-CM

## 2017-12-28 DIAGNOSIS — E7800 Pure hypercholesterolemia, unspecified: Secondary | ICD-10-CM | POA: Diagnosis not present

## 2017-12-28 DIAGNOSIS — E559 Vitamin D deficiency, unspecified: Secondary | ICD-10-CM | POA: Diagnosis not present

## 2017-12-28 DIAGNOSIS — E034 Atrophy of thyroid (acquired): Secondary | ICD-10-CM | POA: Diagnosis not present

## 2017-12-28 DIAGNOSIS — J42 Unspecified chronic bronchitis: Secondary | ICD-10-CM | POA: Diagnosis not present

## 2017-12-28 DIAGNOSIS — C4360 Malignant melanoma of unspecified upper limb, including shoulder: Secondary | ICD-10-CM

## 2017-12-28 NOTE — Patient Instructions (Addendum)
  Continue current medications. Continue good therapeutic lifestyle changes which include good diet and exercise. Fall precautions discussed with patient. If an FOBT was given today- please return it to our front desk. If you are over 59 years old - you may need Prevnar 34 or the adult Pneumonia vaccine.  **Flu shots are available--- please call and schedule a FLU-CLINIC appointment**  After your visit with [place] today you will receive a survey in the mail or online from [employer] regarding your care with [place]. Please take a moment to fill this out. Your feedback is very important to [place] as you can help [place] better understand your patient needs as well as improve your experience and satisfaction. WE CARE ABOUT YOU!!!   Continue with inhaler Continue to follow-up with hematology Try to do as well as possible with diet and exercise to lose weight and lower BMI Continue to drink plenty of water and fluids that have no sugar Take Nexium regularly once daily and when better switch to Zantac or ranitidine and take 1 twice daily before breakfast and supper and hope that she do not have to use the Nexium is often

## 2017-12-28 NOTE — Progress Notes (Signed)
Subjective:    Patient ID: Sarah Garrett, female    DOB: 16-Dec-1960, 57 y.o.   MRN: [id number]  HPI Pt here for follow up and management of chronic medical problems which includes hypothyroid and hyperlipidemia. She is taking medication regularly.  Patient is doing well overall but has gained a couple pounds weight since she was here at her last visit.  Her body mass index is 36.32.  She has 2 or more comorbid risk factors and that would put her in the morbid obese category.  She is due to get her pelvic exam mammogram and DEXA scan and is encouraged to this schedule this.  She had a chest x-ray about a year ago.  She will be given an FOBT to return she has had lab work done and we will review this with her during the visit today.  All of her cholesterol numbers were good and at goal except her triglycerides were elevated at 156 and this is consistent with past readings.  Good good cholesterol remains good and the LDL C was good at 70.  The CBC was within normal limits but her hemoglobin was slightly increased from 4 months ago and is now 16.1.  He does have hemochromatosis.  She will be seen in the oncologist/hematologist soon and he prefers to keep the ferritin less than 100 and the iron saturation less than 30% or else he will have to do phlebotomies.  We did not get a ferritin level this time or an iron saturation level.  Platelet count was good.  The blood sugar was good at 95 and a creatinine and electrolytes were all normal.  All liver function tests were normal.  The patient is doing well overall.  She is concerned about her weight gain and says she does not eat that much but she is also very busy with her mother taking her places and probably does not eat as healthy as she should and she needs to look more at the quality of what she is eating.  She does drink a lot of water and occasionally drinks some Gatorade.  She denies any trouble with chest pain pressure tightness or shortness of breath.   She does have a history of respiratory issues but this seems to have been stable recently.  She is swallowing okay but does have occasional bouts of heartburn and reflux and does take Nexium for those episodes but then weans off of it when she gets better.  I encouraged her to be more regular either with the Nexium or when she gets better to stay on the Zantac so she can keep from having flares of reflux.  She is up-to-date on her colonoscopies and has not seen any blood in the stool or had any black tarry bowel movements.  Her next colonoscopy will not be due until 2023.  She is passing her water well.  She gets her eye exams done yearly because of having to get contacts.  There is a family history of heart attack and stroke.  We did discuss her BMI today and how that she has morbid obesity based on a BMI of 35 or greater in 2 or more comorbid risk factors and I encouraged her to work aggressively with exercise and diet to lose weight.     Patient Active Problem List   Diagnosis Date Noted  . Contact dermatitis 03/24/2013  . Asthma 03/24/2013  . Melanoma of upper arm, right. T1a., History of 09/07/2011  . Hemochromatosis  07/07/2011  . Hypothyroidism 02/17/2008  . Hyperlipidemia 10/11/2007  . ANEMIA-NOS 10/11/2007  . Essential hypertension 10/11/2007  . ALLERGIC RHINITIS 10/11/2007  . BRONCHITIS, CHRONIC 10/11/2007  . GERD 10/11/2007  . ADENOIDECTOMY, HX OF 10/11/2007   Outpatient Encounter Medications as of 12/28/2017  Medication Sig  . ADVAIR DISKUS 100-50 MCG/DOSE AEPB TAKE 1 PUFF BY MOUTH TWICE A DAY  . albuterol (PROVENTIL HFA;VENTOLIN HFA) 108 (90 Base) MCG/ACT inhaler TAKE 2 PUFFS BY MOUTH EVERY 6 HOURS AS NEEDED  . cetirizine (ZYRTEC) 10 MG tablet Take 10 mg by mouth daily.    Marland Kitchen esomeprazole (NEXIUM) 40 MG capsule Take 1 capsule (40 mg total) by mouth as needed.  . furosemide (LASIX) 40 MG tablet TAKE 1 TABLET BY MOUTH EVERY DAY AS NEEDED  . hyoscyamine (LEVSIN SL) 0.125 MG SL  tablet Place 1 tablet (0.125 mg total) under the tongue as needed. Place 1-2 tablet under the tongue until it dissolves as needed  . levothyroxine (SYNTHROID, LEVOTHROID) 125 MCG tablet Take 1 tablet (125 mcg total) by mouth daily.  Marland Kitchen losartan-hydrochlorothiazide (HYZAAR) 100-12.5 MG tablet TAKE 1 TABLET BY MOUTH DAILY. AS DIRECTED  . meclizine (ANTIVERT) 25 MG tablet Take 1 tablet (25 mg total) by mouth 3 (three) times daily as needed for dizziness.  . montelukast (SINGULAIR) 10 MG tablet TAKE 1 TABLET (10 MG TOTAL) BY MOUTH AT BEDTIME.  . rosuvastatin (CRESTOR) 20 MG tablet TAKE 1 TABLET (20 MG TOTAL) BY MOUTH DAILY.  . valACYclovir (VALTREX) 1000 MG tablet Take 1 tablet (1,000 mg total) by mouth 2 (two) times daily.  . Vitamin D, Ergocalciferol, (DRISDOL) 50000 units CAPS capsule Take 1 capsule (50,000 Units total) by mouth every 7 (seven) days.  . [DISCONTINUED] Icosapent Ethyl (VASCEPA) 1 g CAPS Take 2 capsules by mouth 2 (two) times daily.   No facility-administered encounter medications on file as of 12/28/2017.      Review of Systems  Constitutional: Positive for unexpected weight change (increase).  HENT: Negative.   Eyes: Negative.   Respiratory: Negative.   Cardiovascular: Negative.   Gastrointestinal: Negative.   Endocrine: Negative.   Genitourinary: Negative.   Musculoskeletal: Negative.   Skin: Negative.   Allergic/Immunologic: Negative.   Neurological: Negative.   Hematological: Negative.   Psychiatric/Behavioral: Negative.        Objective:   Physical Exam  Constitutional: She is oriented to person, place, and time. She appears well-developed and well-nourished. No distress.  The patient is pleasant and alert  HENT:  Head: Normocephalic and atraumatic.  Right Ear: External ear normal.  Left Ear: External ear normal.  Nose: Nose normal.  Mouth/Throat: Oropharynx is clear and moist. No oropharyngeal exudate.  Eyes: Pupils are equal, round, and reactive to light.  Conjunctivae and EOM are normal. Right eye exhibits no discharge. Left eye exhibits no discharge.  She sees the ophthalmologist yearly  Neck: Normal range of motion. Neck supple. No thyromegaly present.  No bruits thyromegaly or anterior cervical adenopathy  Cardiovascular: Normal rate, regular rhythm, normal heart sounds and intact distal pulses.  No murmur heard. The heart is regular at 72/min  Pulmonary/Chest: Effort normal and breath sounds normal. No respiratory distress. She has no wheezes. She has no rales.  Lungs are clear anteriorly and posteriorly with no wheezes rales or increased congestion with coughing  Abdominal: Soft. Bowel sounds are normal. She exhibits no mass. There is no tenderness.  The abdomen is nontender without liver or spleen enlargement epigastric or suprapubic tenderness masses or bruit  Musculoskeletal: Normal range of motion. She exhibits no edema.  Lymphadenopathy:    She has no cervical adenopathy.  Neurological: She is alert and oriented to person, place, and time. She has normal reflexes. No cranial nerve deficit.  Skin: Skin is warm and dry. No rash noted.  Psychiatric: She has a normal mood and affect. Her behavior is normal. Judgment and thought content normal.  Patient is alert and pleasant and states busy helping her mom and taking care of her husband who has Rita Ohara syndrome.  Nursing note and vitals reviewed.   BP 105/69 (BP Location: Left Arm)   Pulse 95   Temp 98.7 F (37.1 C) (Oral)   Ht 5\' 3"  (1.6 m)   Wt 207 lb (93.9 kg)   LMP 11/29/2011   BMI 36.67 kg/m        Assessment & Plan:  1. Hypothyroidism due to acquired atrophy of thyroid -10 you with current treatment as all thyroid tests were good  2. Hereditary hemochromatosis ([place]) -Follow-up with Dr. Marin Olp as planned  3. Malignant melanoma of upper arm, unspecified laterality ([place]) -Follow-up with dermatology as planned  4. Chronic bronchitis, unspecified chronic  bronchitis type ([place]) -Continue with Advair and avoidance of irritating environments  5. Bronchospasm -Continue with Advair and as needed albuterol she has been doing great with her breathing over the past few months and years.  6. Vitamin D deficiency -Continue with vitamin D replacement  7. Pure hypercholesterolemia -All cholesterol numbers were great other than triglycerides being elevated and she will continue with current treatment and we will keep trying to get insurance to cover Vascepa without causing the patient a lot of money.  8. Essential hypertension -Blood pressure is good today and she will continue with current treatment and sodium restriction  9. Morbid obesity ([place]) -This patient has morbid obesity based on BMI greater than 35 and 2 or more comorbid risk factors.  This was discussed with her and she was encouraged once again to eat healthy drink plenty water and with her dieting and exercise to reduce her BMI  Patient Instructions   Continue current medications. Continue good therapeutic lifestyle changes which include good diet and exercise. Fall precautions discussed with patient. If an FOBT was given today- please return it to our front desk. If you are over 27 years old - you may need Prevnar 52 or the adult Pneumonia vaccine.  **Flu shots are available--- please call and schedule a FLU-CLINIC appointment**  After your visit with [place] today you will receive a survey in the mail or online from [employer] regarding your care with [place]. Please take a moment to fill this out. Your feedback is very important to [place] as you can help [place] better understand your patient needs as well as improve your experience and satisfaction. WE CARE ABOUT YOU!!!   Continue with inhaler Continue to follow-up with hematology Try to do as well as possible with diet and exercise to lose weight and lower BMI Continue to drink plenty of water and fluids that have no sugar Take Nexium regularly  once daily and when better switch to Zantac or ranitidine and take 1 twice daily before breakfast and supper and hope that she do not have to use the Nexium is often   Arrie Senate MD

## 2018-02-08 ENCOUNTER — Inpatient Hospital Stay: Payer: BLUE CROSS/BLUE SHIELD

## 2018-02-08 ENCOUNTER — Inpatient Hospital Stay: Payer: BLUE CROSS/BLUE SHIELD | Attending: Hematology & Oncology | Admitting: Hematology & Oncology

## 2018-02-08 ENCOUNTER — Other Ambulatory Visit: Payer: Self-pay

## 2018-02-08 ENCOUNTER — Encounter: Payer: Self-pay | Admitting: Hematology & Oncology

## 2018-02-08 VITALS — BP 128/81 | HR 84 | Temp 98.2°F | Resp 16 | Wt 205.0 lb

## 2018-02-08 DIAGNOSIS — C4360 Malignant melanoma of unspecified upper limb, including shoulder: Secondary | ICD-10-CM

## 2018-02-08 DIAGNOSIS — E032 Hypothyroidism due to medicaments and other exogenous substances: Secondary | ICD-10-CM

## 2018-02-08 LAB — CMP (CANCER CENTER ONLY)
ALBUMIN: 4.6 g/dL (ref 3.5–5.0)
ALT: 28 U/L (ref 0–44)
AST: 29 U/L (ref 15–41)
Alkaline Phosphatase: 92 U/L (ref 38–126)
Anion gap: 13 (ref 5–15)
BUN: 9 mg/dL (ref 6–20)
CHLORIDE: 100 mmol/L (ref 98–111)
CO2: 29 mmol/L (ref 22–32)
Calcium: 10.4 mg/dL — ABNORMAL HIGH (ref 8.9–10.3)
Creatinine: 0.9 mg/dL (ref 0.44–1.00)
GFR, Est AFR Am: >60 mL/min (ref >60)
GFR, Estimated: >60 mL/min (ref >60)
GLUCOSE: 105 mg/dL — AB (ref 70–99)
POTASSIUM: 3.4 mmol/L — AB (ref 3.5–5.1)
SODIUM: 142 mmol/L (ref 135–145)
Total Bilirubin: 0.7 mg/dL (ref 0.3–1.2)
Total Protein: 8.3 g/dL — ABNORMAL HIGH (ref 6.5–8.1)

## 2018-02-08 LAB — CBC WITH DIFFERENTIAL (CANCER CENTER ONLY)
BASOS PCT: 0 %
Basophils Absolute: 0 10*3/uL (ref 0.0–0.1)
EOS ABS: 0.4 10*3/uL (ref 0.0–0.5)
EOS PCT: 5 %
HCT: 46.6 % (ref 34.8–46.6)
HEMOGLOBIN: 16.1 g/dL — AB (ref 11.6–15.9)
Lymphocytes Relative: 32 %
Lymphs Abs: 2.6 10*3/uL (ref 0.9–3.3)
MCH: 30.4 pg (ref 26.0–34.0)
MCHC: 34.5 g/dL (ref 32.0–36.0)
MCV: 88.1 fL (ref 81.0–101.0)
MONOS PCT: 6 %
Monocytes Absolute: 0.5 10*3/uL (ref 0.1–0.9)
Neutro Abs: 4.6 10*3/uL (ref 1.5–6.5)
Neutrophils Relative %: 57 %
PLATELETS: 188 10*3/uL (ref 145–400)
RBC: 5.29 MIL/uL (ref 3.70–5.32)
RDW: 13.5 % (ref 11.1–15.7)
WBC Count: 8.1 10*3/uL (ref 3.9–10.0)

## 2018-02-08 LAB — TSH: TSH: 0.241 u[IU]/mL — AB (ref 0.308–3.960)

## 2018-02-08 LAB — IRON AND TIBC
IRON: 110 ug/dL (ref 41–142)
SATURATION RATIOS: 29 % (ref 21–57)
TIBC: 375 ug/dL (ref 236–444)
UIBC: 265 ug/dL

## 2018-02-08 LAB — FERRITIN: Ferritin: 77 ng/mL (ref 11–307)

## 2018-02-08 NOTE — Progress Notes (Signed)
Hematology and Oncology Follow Up Visit  Sarah Garrett [id number] 02-19-1961 57 y.o. 02/08/2018   Principle Diagnosis:  1. 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 2. 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  Current Therapy:    Phlebotomy to maintain ferritin less than 100 or iron saturation less than 30%.     Interim History:  [name] is back for followup.  We saw her 6 months ago.  She is going to be a grandmother.  She would be a grandmother in February 2020.  She is looking forward to this.  She really had no complaints.  She is had no suspicious skin lesions removed.  She is had no cough or shortness of breath.  There is been no problems with infections.  Her iron studies back in May showed a her iron studies done back in January showed a ferritin of 66 with an iron saturation of 29%.    Overall, her performance status is ECOG 0.   Medications:  Current Outpatient Medications:  .  ADVAIR DISKUS 100-50 MCG/DOSE AEPB, TAKE 1 PUFF BY MOUTH TWICE A DAY, Disp: 180 each, Rfl: 0 .  albuterol (PROVENTIL HFA;VENTOLIN HFA) 108 (90 Base) MCG/ACT inhaler, TAKE 2 PUFFS BY MOUTH EVERY 6 HOURS AS NEEDED, Disp: 18 Inhaler, Rfl: 4 .  cetirizine (ZYRTEC) 10 MG tablet, Take 10 mg by mouth daily.  , Disp: , Rfl:  .  esomeprazole (NEXIUM) 40 MG capsule, Take 1 capsule (40 mg total) by mouth as needed., Disp: 30 capsule, Rfl: 6 .  furosemide (LASIX) 40 MG tablet, TAKE 1 TABLET BY MOUTH EVERY DAY AS NEEDED, Disp: 90 tablet, Rfl: 0 .  hyoscyamine (LEVSIN SL) 0.125 MG SL tablet, Place 1 tablet (0.125 mg total) under the tongue as needed. Place 1-2 tablet under the tongue until it dissolves as needed, Disp: 30 tablet, Rfl: 2 .  levothyroxine (SYNTHROID, LEVOTHROID) 125 MCG tablet, Take 1 tablet (125 mcg total) by mouth daily., Disp: 30 tablet, Rfl: 11 .  losartan-hydrochlorothiazide (HYZAAR) 100-12.5 MG tablet, TAKE 1 TABLET BY MOUTH DAILY. AS DIRECTED, Disp: 90 tablet,  Rfl: 0 .  meclizine (ANTIVERT) 25 MG tablet, Take 1 tablet (25 mg total) by mouth 3 (three) times daily as needed for dizziness., Disp: 30 tablet, Rfl: 0 .  montelukast (SINGULAIR) 10 MG tablet, TAKE 1 TABLET (10 MG TOTAL) BY MOUTH AT BEDTIME., Disp: 30 tablet, Rfl: 5 .  rosuvastatin (CRESTOR) 20 MG tablet, TAKE 1 TABLET (20 MG TOTAL) BY MOUTH DAILY., Disp: 30 tablet, Rfl: 5 .  valACYclovir (VALTREX) 1000 MG tablet, Take 1 tablet (1,000 mg total) by mouth 2 (two) times daily., Disp: 14 tablet, Rfl: 0 .  Vitamin D, Ergocalciferol, (DRISDOL) 50000 units CAPS capsule, Take 1 capsule (50,000 Units total) by mouth every 7 (seven) days., Disp: 12 capsule, Rfl: 3  Allergies:  Allergies  Allergen Reactions  . Ace Inhibitors     Per NW:[id number]  . Actos [Pioglitazone Hydrochloride]     Per pt: unknown  . Aspirin Swelling    Swelling of mouth and throat  . Ciprofloxacin   . Crestor [Rosuvastatin Calcium]     Per pt: unknown  . Pneumococcal Vaccines     Red rash  . Sulfonamide Derivatives     Per pt: unknown    Past Medical History, Surgical history, Social history, and Family History were reviewed and updated.  Review of Systems: As stated in the interim history  Physical Exam:  weight is  205 lb (93 kg). Her oral temperature is 98.2 F (36.8 C). Her blood pressure is 128/81 and her pulse is 84. Her respiration is 16 and oxygen saturation is 95%.   Physical Exam  Constitutional: She is oriented to person, place, and time.  HENT:  Head: Normocephalic and atraumatic.  Mouth/Throat: Oropharynx is clear and moist.  Eyes: Pupils are equal, round, and reactive to light. EOM are normal.  Neck: Normal range of motion.  Cardiovascular: Normal rate, regular rhythm and normal heart sounds.  Pulmonary/Chest: Effort normal and breath sounds normal.  Abdominal: Soft. Bowel sounds are normal.  Musculoskeletal: Normal range of motion. She exhibits no edema, tenderness or deformity.  Lymphadenopathy:     She has no cervical adenopathy.  Neurological: She is alert and oriented to person, place, and time.  Skin: Skin is warm and dry. No rash noted. No erythema.  Psychiatric: She has a normal mood and affect. Her behavior is normal. Judgment and thought content normal.  Vitals reviewed.    Lab Results  Component Value Date   WBC 8.1 02/08/2018   HGB 16.1 (H) 02/08/2018   HCT 46.6 02/08/2018   MCV 88.1 02/08/2018   PLT 188 02/08/2018     Chemistry      Component Value Date/Time   NA 144 12/20/2017 1024   NA 145 08/03/2017 0905   NA 140 07/19/2016 0834   K 3.7 12/20/2017 1024   K 3.6 08/03/2017 0905   K 3.2 (L) 07/19/2016 0834   CL 99 12/20/2017 1024   CL 102 08/03/2017 0905   CO2 28 12/20/2017 1024   CO2 31 08/03/2017 0905   CO2 27 07/19/2016 0834   BUN 12 12/20/2017 1024   BUN 9 08/03/2017 0905   BUN 11.0 07/19/2016 0834   CREATININE 0.92 12/20/2017 1024   CREATININE 0.8 08/03/2017 0905   CREATININE 0.8 07/19/2016 0834      Component Value Date/Time   CALCIUM 10.1 12/20/2017 1024   CALCIUM 10.3 08/03/2017 0905   CALCIUM 10.1 07/19/2016 0834   ALKPHOS 82 12/20/2017 1024   ALKPHOS 84 08/03/2017 0905   ALKPHOS 85 07/19/2016 0834   AST 27 12/20/2017 1024   AST 32 08/03/2017 0905   AST 26 07/19/2016 0834   ALT 26 12/20/2017 1024   ALT 39 08/03/2017 0905   ALT 25 07/19/2016 0834   BILITOT 0.5 12/20/2017 1024   BILITOT 0.80 08/03/2017 0905   BILITOT 0.81 07/19/2016 0834         Impression and Plan: Sarah Garrett is 57 year old white female. She has hereditary hemochromatosis. 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  For right now, everything looks okay.  We will see what her iron studies show.  I will check her TSH.  She is gained a little weight.  She does feel tired.  Will see that her TSH is monitored.  I will plan to get her back in 6 more months.  Sarah Napoleon, MD 7/12/201910:09 AM

## 2018-02-11 ENCOUNTER — Telehealth: Payer: Self-pay | Admitting: *Deleted

## 2018-02-11 NOTE — Telephone Encounter (Addendum)
Patient is aware of results. Message sent to scheduler.   ----- Message from [name], MD sent at 02/08/2018  4:17 PM EDT ----- Call - the thyroid is perfect!!  Your iron is trending up a little.  1 phlebotomy will help until we see you in 6 months.  Sarah Garrett

## 2018-02-25 ENCOUNTER — Other Ambulatory Visit: Payer: Self-pay | Admitting: Family

## 2018-02-25 ENCOUNTER — Inpatient Hospital Stay: Payer: BLUE CROSS/BLUE SHIELD

## 2018-02-25 NOTE — Progress Notes (Signed)
Rada Hay presents today for phlebotomy per MD orders. Phlebotomy procedure started at 1043 and ended at 1050 with  500 grams removed via 16 g to RAC by [employer].  Patient observed for 30 minutes after feeling lightheaded and nauseated. Alert and oriented and drinking fluids. Patient tolerated procedure well. Diet and nutrition offered.

## 2018-03-15 ENCOUNTER — Other Ambulatory Visit: Payer: Self-pay | Admitting: Family Medicine

## 2018-04-19 ENCOUNTER — Other Ambulatory Visit: Payer: Self-pay | Admitting: Family Medicine

## 2018-05-02 ENCOUNTER — Other Ambulatory Visit: Payer: Self-pay | Admitting: Family Medicine

## 2018-05-03 ENCOUNTER — Other Ambulatory Visit: Payer: BLUE CROSS/BLUE SHIELD

## 2018-05-03 ENCOUNTER — Ambulatory Visit: Payer: BLUE CROSS/BLUE SHIELD | Admitting: Family Medicine

## 2018-05-03 DIAGNOSIS — E559 Vitamin D deficiency, unspecified: Secondary | ICD-10-CM | POA: Diagnosis not present

## 2018-05-03 DIAGNOSIS — I10 Essential (primary) hypertension: Secondary | ICD-10-CM | POA: Diagnosis not present

## 2018-05-03 DIAGNOSIS — E034 Atrophy of thyroid (acquired): Secondary | ICD-10-CM | POA: Diagnosis not present

## 2018-05-03 DIAGNOSIS — E7800 Pure hypercholesterolemia, unspecified: Secondary | ICD-10-CM | POA: Diagnosis not present

## 2018-05-04 LAB — CBC WITH DIFFERENTIAL/PLATELET
Basophils Absolute: 0 10*3/uL (ref 0.0–0.2)
Basos: 0 %
EOS (ABSOLUTE): 0.3 10*3/uL (ref 0.0–0.4)
Eos: 4 %
Hematocrit: 41.8 % (ref 34.0–46.6)
Hemoglobin: 14.3 g/dL (ref 11.1–15.9)
Immature Grans (Abs): 0 10*3/uL (ref 0.0–0.1)
Immature Granulocytes: 0 %
LYMPHS: 35 %
Lymphocytes Absolute: 2.8 10*3/uL (ref 0.7–3.1)
MCH: 30.4 pg (ref 26.6–33.0)
MCHC: 34.2 g/dL (ref 31.5–35.7)
MCV: 89 fL (ref 79–97)
MONOS ABS: 0.4 10*3/uL (ref 0.1–0.9)
Monocytes: 5 %
NEUTROS ABS: 4.4 10*3/uL (ref 1.4–7.0)
NEUTROS PCT: 56 %
PLATELETS: 190 10*3/uL (ref 150–450)
RBC: 4.7 x10E6/uL (ref 3.77–5.28)
RDW: 13.7 % (ref 12.3–15.4)
WBC: 8 10*3/uL (ref 3.4–10.8)

## 2018-05-04 LAB — BMP8+EGFR
BUN/Creatinine Ratio: 14 (ref 9–23)
BUN: 11 mg/dL (ref 6–24)
CALCIUM: 9.5 mg/dL (ref 8.7–10.2)
CO2: 27 mmol/L (ref 20–29)
CREATININE: 0.79 mg/dL (ref 0.57–1.00)
Chloride: 102 mmol/L (ref 96–106)
GFR calc Af Amer: 95 mL/min/{1.73_m2} (ref >59)
GFR, EST NON AFRICAN AMERICAN: 82 mL/min/{1.73_m2} (ref >59)
GLUCOSE: 90 mg/dL (ref 65–99)
POTASSIUM: 3.4 mmol/L — AB (ref 3.5–5.2)
Sodium: 143 mmol/L (ref 134–144)

## 2018-05-04 LAB — HEPATIC FUNCTION PANEL
ALT: 16 IU/L (ref 0–32)
AST: 17 IU/L (ref 0–40)
Albumin: 4.2 g/dL (ref 3.5–5.5)
Alkaline Phosphatase: 76 IU/L (ref 39–117)
Bilirubin Total: 0.3 mg/dL (ref 0.0–1.2)
Bilirubin, Direct: 0.1 mg/dL (ref 0.00–0.40)
Total Protein: 6.5 g/dL (ref 6.0–8.5)

## 2018-05-04 LAB — LIPID PANEL
Chol/HDL Ratio: 3.1 ratio (ref 0.0–4.4)
Cholesterol, Total: 128 mg/dL (ref 100–199)
HDL: 41 mg/dL (ref >39)
LDL Calculated: 57 mg/dL (ref 0–99)
Triglycerides: 152 mg/dL — ABNORMAL HIGH (ref 0–149)
VLDL Cholesterol Cal: 30 mg/dL (ref 5–40)

## 2018-05-04 LAB — VITAMIN D 25 HYDROXY (VIT D DEFICIENCY, FRACTURES): Vit D, 25-Hydroxy: 49.2 ng/mL (ref 30.0–100.0)

## 2018-05-04 LAB — THYROID PANEL WITH TSH
FREE THYROXINE INDEX: 2.4 (ref 1.2–4.9)
T3 UPTAKE RATIO: 27 % (ref 24–39)
T4, Total: 8.8 ug/dL (ref 4.5–12.0)
TSH: 0.68 u[IU]/mL (ref 0.450–4.500)

## 2018-05-06 ENCOUNTER — Ambulatory Visit (INDEPENDENT_AMBULATORY_CARE_PROVIDER_SITE_OTHER): Payer: BLUE CROSS/BLUE SHIELD

## 2018-05-06 ENCOUNTER — Ambulatory Visit (INDEPENDENT_AMBULATORY_CARE_PROVIDER_SITE_OTHER): Payer: BLUE CROSS/BLUE SHIELD | Admitting: Family Medicine

## 2018-05-06 ENCOUNTER — Encounter: Payer: Self-pay | Admitting: Family Medicine

## 2018-05-06 VITALS — BP 136/81 | HR 84 | Temp 97.9°F | Ht 63.0 in | Wt 209.0 lb

## 2018-05-06 DIAGNOSIS — I10 Essential (primary) hypertension: Secondary | ICD-10-CM

## 2018-05-06 DIAGNOSIS — E034 Atrophy of thyroid (acquired): Secondary | ICD-10-CM

## 2018-05-06 DIAGNOSIS — J9801 Acute bronchospasm: Secondary | ICD-10-CM

## 2018-05-06 DIAGNOSIS — E876 Hypokalemia: Secondary | ICD-10-CM

## 2018-05-06 DIAGNOSIS — Z13820 Encounter for screening for osteoporosis: Secondary | ICD-10-CM

## 2018-05-06 DIAGNOSIS — R5383 Other fatigue: Secondary | ICD-10-CM

## 2018-05-06 DIAGNOSIS — E559 Vitamin D deficiency, unspecified: Secondary | ICD-10-CM

## 2018-05-06 DIAGNOSIS — K219 Gastro-esophageal reflux disease without esophagitis: Secondary | ICD-10-CM

## 2018-05-06 DIAGNOSIS — R0683 Snoring: Secondary | ICD-10-CM

## 2018-05-06 DIAGNOSIS — Z780 Asymptomatic menopausal state: Secondary | ICD-10-CM

## 2018-05-06 DIAGNOSIS — E7800 Pure hypercholesterolemia, unspecified: Secondary | ICD-10-CM | POA: Diagnosis not present

## 2018-05-06 MED ORDER — POTASSIUM CHLORIDE ER 10 MEQ PO TBCR: 10.0000 meq | EXTENDED_RELEASE_TABLET | Freq: Every day | ORAL | 1 refills | Status: DC

## 2018-05-06 NOTE — Progress Notes (Signed)
Subjective:    Patient ID: Sarah Garrett, female    DOB: [birth year], 59 y.o.   MRN: [id number]  HPI Pt here for follow up and management of chronic medical problems which includes hypothyroid and hypertension. She is taking medication regularly.  The patient today complains of ongoing stress in her life.  She is up-to-date on her mammogram and Pap smear.  She will get a DEXA scan today and will be given an FOBT to return.  She has had lab work done this will be reviewed with her during the visit today.  She wants to wait on getting her flu shot.  The thyroid test were all normal.  Blood sugar was good and the creatinine was normal and all the electrolytes are good but the potassium remains slightly decreased at 3.4.  As result of this we will go ahead and start 10 mEq of potassium 1 daily and check a BMP again in a couple weeks after starting this.  The CBC was entirely within normal limits with a good hemoglobin at 14.3 and adequate white count and platelet count.  Cholesterol numbers have a triglyceride that is slightly elevated at 152 and an excellent LDL-C and a good cholesterol that is within normal limits.  We will check and see if her insurance will cover Vascepa.  The vitamin D level was good and she will continue with current treatment all liver function tests were normal.  The patient is doing well overall just a lot of stress worrying about her mom worrying about her husband worrying about her daughter who is having some health issues.  She just did have an eye exam.  She denies any chest pain.  She denies any shortness of breath anymore than usual.  She uses her inhalers and allergy medicines regularly.  She denies any trouble with swallowing heartburn indigestion nausea vomiting diarrhea blood in the stool change in bowel habits or black tarry bowel movements.  She is passing her water without problems.  She is active but physically being very busy.  This patient used to come in frequently  with asthmatic attacks but this is not been an issue for her in a long time.  She does complain of fatigue and her husband says that she snores a lot.  Also her mother says she snores a lot.  We will schedule her for a sleep apnea evaluation as soon as she gets back in touch with [place] about who she would like to see for this.     Patient Active Problem List   Diagnosis Date Noted  . Contact dermatitis 03/24/2013  . Asthma 03/24/2013  . Melanoma of upper arm, right. T1a., History of 09/07/2011  . Hemochromatosis 07/07/2011  . Hypothyroidism 02/17/2008  . Hyperlipidemia 10/11/2007  . ANEMIA-NOS 10/11/2007  . Essential hypertension 10/11/2007  . ALLERGIC RHINITIS 10/11/2007  . BRONCHITIS, CHRONIC 10/11/2007  . GERD 10/11/2007  . ADENOIDECTOMY, HX OF 10/11/2007   Outpatient Encounter Medications as of 05/06/2018  Medication Sig  . ADVAIR DISKUS 100-50 MCG/DOSE AEPB TAKE 1 PUFF BY MOUTH TWICE A DAY  . albuterol (PROVENTIL HFA;VENTOLIN HFA) 108 (90 Base) MCG/ACT inhaler TAKE 2 PUFFS BY MOUTH EVERY 6 HOURS AS NEEDED  . cetirizine (ZYRTEC) 10 MG tablet Take 10 mg by mouth daily.    Marland Kitchen esomeprazole (NEXIUM) 40 MG capsule Take 1 capsule (40 mg total) by mouth as needed.  . furosemide (LASIX) 40 MG tablet TAKE 1 TABLET BY MOUTH EVERY DAY AS NEEDED  .  hyoscyamine (LEVSIN SL) 0.125 MG SL tablet Place 1 tablet (0.125 mg total) under the tongue as needed. Place 1-2 tablet under the tongue until it dissolves as needed  . levothyroxine (SYNTHROID, LEVOTHROID) 125 MCG tablet TAKE 1 TABLET BY MOUTH EVERY DAY  . losartan-hydrochlorothiazide (HYZAAR) 100-12.5 MG tablet TAKE 1 TABLET BY MOUTH DAILY. AS DIRECTED  . meclizine (ANTIVERT) 25 MG tablet Take 1 tablet (25 mg total) by mouth 3 (three) times daily as needed for dizziness.  . montelukast (SINGULAIR) 10 MG tablet TAKE 1 TABLET (10 MG TOTAL) BY MOUTH AT BEDTIME.  . rosuvastatin (CRESTOR) 20 MG tablet TAKE 1 TABLET (20 MG TOTAL) BY MOUTH DAILY.  Marland Kitchen Vitamin  D, Ergocalciferol, (DRISDOL) 50000 units CAPS capsule Take 1 capsule (50,000 Units total) by mouth every 7 (seven) days.  . [DISCONTINUED] valACYclovir (VALTREX) 1000 MG tablet Take 1 tablet (1,000 mg total) by mouth 2 (two) times daily.   No facility-administered encounter medications on file as of 05/06/2018.     \ Review of Systems  Constitutional: Negative.   HENT: Negative.   Eyes: Negative.   Respiratory: Negative.   Cardiovascular: Negative.   Gastrointestinal: Negative.   Endocrine: Negative.   Genitourinary: Negative.   Musculoskeletal: Negative.   Skin: Negative.   Allergic/Immunologic: Negative.   Neurological: Negative.   Hematological: Negative.   Psychiatric/Behavioral: Negative.        Stress       Objective:   Physical Exam  Constitutional: She is oriented to person, place, and time. She appears well-developed and well-nourished. She appears distressed.  The patient is pleasant and stressed due to family situations.  HENT:  Head: Normocephalic and atraumatic.  Right Ear: External ear normal.  Left Ear: External ear normal.  Nose: Nose normal.  Mouth/Throat: Oropharynx is clear and moist. No oropharyngeal exudate.  Eyes: Pupils are equal, round, and reactive to light. Conjunctivae and EOM are normal. Right eye exhibits no discharge. Left eye exhibits no discharge. No scleral icterus.  Recently seen by eye doctor and patient will get a copy of this report sent to our office  Neck: Normal range of motion. Neck supple. No thyromegaly present.  No bruits thyromegaly or anterior cervical adenopathy  Cardiovascular: Normal rate, regular rhythm, normal heart sounds and intact distal pulses.  No murmur heard. The heart is regular at 72/min  Pulmonary/Chest: Effort normal and breath sounds normal. She has no wheezes. She has no rales.  Chest is clear anteriorly and posteriorly with no wheezes or rales being present and even with coughing a dry cough.  Abdominal: Soft.  Bowel sounds are normal. She exhibits no mass. There is no tenderness.  No liver or spleen enlargement.  No epigastric tenderness.  No masses or bruits or inguinal adenopathy or suprapubic tenderness.  Musculoskeletal: Normal range of motion. She exhibits no edema.  Lymphadenopathy:    She has no cervical adenopathy.  Neurological: She is alert and oriented to person, place, and time. She has normal reflexes. No cranial nerve deficit.  Lower extremity reflexes are equal bilaterally  Skin: Skin is warm and dry. No rash noted.  Psychiatric: She has a normal mood and affect. Her behavior is normal. Judgment and thought content normal.  Mood affect and behavior are normal for this patient other than the increased stress and worry she has had.  Nursing note and vitals reviewed.  BP 136/81 (BP Location: Left Arm)   Pulse 84   Temp 97.9 F (36.6 C) (Oral)   Ht 5'  3" (1.6 m)   Wt 209 lb (94.8 kg)   LMP 11/29/2011   BMI 37.02 kg/m         Assessment & Plan:  1. Hypothyroidism due to acquired atrophy of thyroid -Continue with current treatment as all thyroid tests were normal.  2. Vitamin D deficiency -Continue with vitamin D replacement - DG [facility] DEXA; Future  3. Pure hypercholesterolemia -The LDL-C was great but the triglycerides remain slightly elevated.  The patient will try to do better with diet and exercise as her insurance still will not cover her taking vascepa  4. Essential hypertension -Blood pressure is good today and she will continue with current treatment  5. Gastroesophageal reflux disease, esophagitis presence not specified -Continue with Nexium and diet of avoidance  6. Screening for osteoporosis - DG [facility] DEXA; Future  7. Postmenopausal - DG [facility] DEXA; Future  8. Decreased potassium in the blood -Start potassium 10 mEq 1 daily and recheck BMP in the next 2 to 3 weeks - BMP8+EGFR; Future  9. Fatigue, unspecified type -Consider getting sleep study  10.  Loud snoring -Consider sleep study.  Patient will get back in touch with [place] regarding if she wants to go see the pulmonologist or wants this done through the neurologist.  11. Bronchospasm -Patient has been doing great with her airway problems and will continue with her current inhalers and Singulair.  Patient Instructions  Continue current medications. Continue good therapeutic lifestyle changes which include good diet and exercise. Fall precautions discussed with patient. If an FOBT was given today- please return it to our front desk. If you are over 38 years old - you may need Prevnar 60 or the adult Pneumonia vaccine.  **Flu shots are available--- please call and schedule a FLU-CLINIC appointment**  After your visit with [place] today you will receive a survey in the mail or online from [employer] regarding your care with [place]. Please take a moment to fill this out. Your feedback is very important to [place] as you can help [place] better understand your patient needs as well as improve your experience and satisfaction. WE CARE ABOUT YOU!!!   We will call in a prescription for the Vascepa and have you take 2 g twice daily if it is too expensive you can not pick this prescription up. Continue with your inhalers Continue to avoid situations which are aggravating your allergies Make sure that you get the ophthalmologist to send [place] a copy of your last eye exam report Please get back in touch with [place] regarding scheduling the evaluation for sleep apnea and daytime fatigue. Drink plenty of fluids and stay well-hydrated   Arrie Senate MD

## 2018-05-06 NOTE — Patient Instructions (Addendum)
Continue current medications. Continue good therapeutic lifestyle changes which include good diet and exercise. Fall precautions discussed with patient. If an FOBT was given today- please return it to our front desk. If you are over 59 years old - you may need Prevnar 41 or the adult Pneumonia vaccine.  **Flu shots are available--- please call and schedule a FLU-CLINIC appointment**  After your visit with [place] today you will receive a survey in the mail or online from [employer] regarding your care with [place]. Please take a moment to fill this out. Your feedback is very important to [place] as you can help [place] better understand your patient needs as well as improve your experience and satisfaction. WE CARE ABOUT YOU!!!   We will call in a prescription for the Vascepa and have you take 2 g twice daily if it is too expensive you can not pick this prescription up. Continue with your inhalers Continue to avoid situations which are aggravating your allergies Make sure that you get the ophthalmologist to send [place] a copy of your last eye exam report Please get back in touch with [place] regarding scheduling the evaluation for sleep apnea and daytime fatigue. Drink plenty of fluids and stay well-hydrated

## 2018-05-16 DIAGNOSIS — Z780 Asymptomatic menopausal state: Secondary | ICD-10-CM | POA: Diagnosis not present

## 2018-06-01 ENCOUNTER — Other Ambulatory Visit: Payer: Self-pay | Admitting: Family Medicine

## 2018-06-18 ENCOUNTER — Other Ambulatory Visit: Payer: Self-pay | Admitting: Family Medicine

## 2018-06-23 ENCOUNTER — Other Ambulatory Visit: Payer: Self-pay | Admitting: Family Medicine

## 2018-06-25 DIAGNOSIS — L819 Disorder of pigmentation, unspecified: Secondary | ICD-10-CM | POA: Diagnosis not present

## 2018-06-25 DIAGNOSIS — L578 Other skin changes due to chronic exposure to nonionizing radiation: Secondary | ICD-10-CM | POA: Diagnosis not present

## 2018-06-25 DIAGNOSIS — L821 Other seborrheic keratosis: Secondary | ICD-10-CM | POA: Diagnosis not present

## 2018-06-25 DIAGNOSIS — L814 Other melanin hyperpigmentation: Secondary | ICD-10-CM | POA: Diagnosis not present

## 2018-07-29 ENCOUNTER — Other Ambulatory Visit: Payer: Self-pay | Admitting: Family Medicine

## 2018-08-06 ENCOUNTER — Other Ambulatory Visit: Payer: Self-pay | Admitting: Family Medicine

## 2018-08-06 MED ORDER — LOSARTAN POTASSIUM-HCTZ 100-12.5 MG PO TABS: 1.0000 | ORAL_TABLET | Freq: Every day | ORAL | 0 refills | Status: DC

## 2018-08-06 NOTE — Addendum Note (Signed)
Addended by: Antonietta Barcelona D on: 08/06/2018 11:49 AM   Modules accepted: Orders

## 2018-08-09 ENCOUNTER — Encounter: Payer: Self-pay | Admitting: Hematology & Oncology

## 2018-08-09 ENCOUNTER — Inpatient Hospital Stay: Payer: BLUE CROSS/BLUE SHIELD | Attending: Hematology & Oncology | Admitting: Hematology & Oncology

## 2018-08-09 ENCOUNTER — Inpatient Hospital Stay: Payer: BLUE CROSS/BLUE SHIELD

## 2018-08-09 ENCOUNTER — Other Ambulatory Visit: Payer: Self-pay

## 2018-08-09 ENCOUNTER — Telehealth: Payer: Self-pay | Admitting: *Deleted

## 2018-08-09 DIAGNOSIS — E032 Hypothyroidism due to medicaments and other exogenous substances: Secondary | ICD-10-CM

## 2018-08-09 DIAGNOSIS — Z79899 Other long term (current) drug therapy: Secondary | ICD-10-CM | POA: Insufficient documentation

## 2018-08-09 DIAGNOSIS — Z85820 Personal history of malignant melanoma of skin: Secondary | ICD-10-CM | POA: Diagnosis not present

## 2018-08-09 LAB — FERRITIN: FERRITIN: 56 ng/mL (ref 11–307)

## 2018-08-09 LAB — CBC WITH DIFFERENTIAL (CANCER CENTER ONLY)
Abs Immature Granulocytes: 0.01 10*3/uL (ref 0.00–0.07)
BASOS ABS: 0.1 10*3/uL (ref 0.0–0.1)
Basophils Relative: 1 %
Eosinophils Absolute: 0.6 10*3/uL — ABNORMAL HIGH (ref 0.0–0.5)
Eosinophils Relative: 9 %
HCT: 46 % (ref 36.0–46.0)
Hemoglobin: 15.6 g/dL — ABNORMAL HIGH (ref 12.0–15.0)
Immature Granulocytes: 0 %
Lymphocytes Relative: 33 %
Lymphs Abs: 2.3 10*3/uL (ref 0.7–4.0)
MCH: 30.1 pg (ref 26.0–34.0)
MCHC: 33.9 g/dL (ref 30.0–36.0)
MCV: 88.6 fL (ref 80.0–100.0)
Monocytes Absolute: 0.3 10*3/uL (ref 0.1–1.0)
Monocytes Relative: 5 %
Neutro Abs: 3.6 10*3/uL (ref 1.7–7.7)
Neutrophils Relative %: 52 %
Platelet Count: 182 10*3/uL (ref 150–400)
RBC: 5.19 MIL/uL — ABNORMAL HIGH (ref 3.87–5.11)
RDW: 13.6 % (ref 11.5–15.5)
WBC Count: 6.9 10*3/uL (ref 4.0–10.5)
nRBC: 0 % (ref 0.0–0.2)

## 2018-08-09 LAB — CMP (CANCER CENTER ONLY)
ALT: 21 U/L (ref 0–44)
AST: 21 U/L (ref 15–41)
Albumin: 4.8 g/dL (ref 3.5–5.0)
Alkaline Phosphatase: 71 U/L (ref 38–126)
Anion gap: 10 (ref 5–15)
BUN: 11 mg/dL (ref 6–20)
CHLORIDE: 98 mmol/L (ref 98–111)
CO2: 34 mmol/L — ABNORMAL HIGH (ref 22–32)
Calcium: 9.8 mg/dL (ref 8.9–10.3)
Creatinine: 0.85 mg/dL (ref 0.44–1.00)
GFR, Est AFR Am: >60 mL/min (ref >60)
GFR, Estimated: >60 mL/min (ref >60)
Glucose, Bld: 107 mg/dL — ABNORMAL HIGH (ref 70–99)
Potassium: 3.1 mmol/L — ABNORMAL LOW (ref 3.5–5.1)
Sodium: 142 mmol/L (ref 135–145)
Total Bilirubin: 0.6 mg/dL (ref 0.3–1.2)
Total Protein: 7.9 g/dL (ref 6.5–8.1)

## 2018-08-09 LAB — TSH: TSH: 0.59 u[IU]/mL (ref 0.308–3.960)

## 2018-08-09 LAB — IRON AND TIBC
Iron: 108 ug/dL (ref 41–142)
Saturation Ratios: 30 % (ref 21–57)
TIBC: 361 ug/dL (ref 236–444)
UIBC: 254 ug/dL (ref 120–384)

## 2018-08-09 NOTE — Telephone Encounter (Signed)
As noted below by Dr. Marin Olp, I informed the patient of her lab values and she does not need a phlebotomy at this time. She verbalized understanding.

## 2018-08-09 NOTE — Telephone Encounter (Signed)
-----   Message from [name], MD sent at 08/09/2018  2:15 PM EST ----- Call - the iron level is still ok!!!  NO need for a phlebotomy!!  Laurey Arrow

## 2018-08-09 NOTE — Progress Notes (Signed)
Hematology and Oncology Follow Up Visit  Sarah Garrett [id number] [birth year] 58 y.o. 08/09/2018   Principle Diagnosis:  1. 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 2. 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  Current Therapy:    Phlebotomy to maintain ferritin less than 100 or iron saturation less than 30%.     Interim History:  Ms.  Sarah Garrett is back for followup.  She is doing quite well.  She now will be a grandmother within a month.  She is very excited about this.  It will be a baby girl.  She is having no problems otherwise.  She is seeing the skin doctor in March.  It sounds like he wants to try her on some type of skin cream to help with some pre-malignant type lesions.  I am not sure if she will go for that or not.  She is not sure when her last mammogram was.  She says that she will have one this year.  She has had no problem with fever.  She has bad allergies.  Because of the warm winter so far, her allergies have not gotten any better.  She is still working.    Her iron studies back in July showed a ferritin of 77 with an iron saturation of 29%.  Overall, her performance status is ECOG 0.   Medications:  Current Outpatient Medications:  .  ADVAIR DISKUS 100-50 MCG/DOSE AEPB, TAKE 1 PUFF BY MOUTH TWICE A DAY, Disp: 180 each, Rfl: 0 .  albuterol (PROVENTIL HFA;VENTOLIN HFA) 108 (90 Base) MCG/ACT inhaler, TAKE 2 PUFFS BY MOUTH EVERY 6 HOURS AS NEEDED, Disp: 18 Inhaler, Rfl: 4 .  cetirizine (ZYRTEC) 10 MG tablet, Take 10 mg by mouth daily.  , Disp: , Rfl:  .  esomeprazole (NEXIUM) 40 MG capsule, Take 1 capsule (40 mg total) by mouth as needed., Disp: 30 capsule, Rfl: 6 .  furosemide (LASIX) 40 MG tablet, TAKE 1 TABLET BY MOUTH EVERY DAY AS NEEDED, Disp: 90 tablet, Rfl: 1 .  hyoscyamine (LEVSIN SL) 0.125 MG SL tablet, Place 1 tablet (0.125 mg total) under the tongue as needed. Place 1-2 tablet under the tongue until it dissolves as needed, Disp: 30  tablet, Rfl: 2 .  levothyroxine (SYNTHROID, LEVOTHROID) 125 MCG tablet, TAKE 1 TABLET BY MOUTH EVERY DAY, Disp: 90 tablet, Rfl: 3 .  losartan-hydrochlorothiazide (HYZAAR) 100-12.5 MG tablet, Take 1 tablet by mouth daily. as directed, Disp: 90 tablet, Rfl: 0 .  meclizine (ANTIVERT) 25 MG tablet, Take 1 tablet (25 mg total) by mouth 3 (three) times daily as needed for dizziness., Disp: 30 tablet, Rfl: 0 .  montelukast (SINGULAIR) 10 MG tablet, TAKE 1 TABLET BY MOUTH EVERYDAY AT BEDTIME, Disp: 90 tablet, Rfl: 0 .  potassium chloride (K-DUR) 10 MEQ tablet, Take 1 tablet (10 mEq total) by mouth daily., Disp: 90 tablet, Rfl: 1 .  rosuvastatin (CRESTOR) 20 MG tablet, TAKE 1 TABLET (20 MG TOTAL) BY MOUTH DAILY., Disp: 30 tablet, Rfl: 5 .  Vitamin D, Ergocalciferol, (DRISDOL) 50000 units CAPS capsule, Take 1 capsule (50,000 Units total) by mouth every 7 (seven) days., Disp: 12 capsule, Rfl: 3  Allergies:  Allergies  Allergen Reactions  . Ace Inhibitors     Per [id number]  . Actos [Pioglitazone Hydrochloride]     Per pt: unknown  . Aspirin Swelling    Swelling of mouth and throat  . Ciprofloxacin   . Crestor [Rosuvastatin Calcium]     Per pt:  unknown  . Pneumococcal Vaccines     Red rash  . Sulfonamide Derivatives     Per pt: unknown    Past Medical History, Surgical history, Social history, and Family History were reviewed and updated.  Review of Systems: As stated in the interim history  Physical Exam:  weight is 203 lb (92.1 kg). Her oral temperature is 98.2 F (36.8 C). Her blood pressure is 132/67 and her pulse is 85. Her respiration is 16 and oxygen saturation is 96%.   Physical Exam Vitals signs reviewed.  HENT:     Head: Normocephalic and atraumatic.  Eyes:     Pupils: Pupils are equal, round, and reactive to light.  Neck:     Musculoskeletal: Normal range of motion.  Cardiovascular:     Rate and Rhythm: Normal rate and regular rhythm.     Heart sounds: Normal heart  sounds.  Pulmonary:     Effort: Pulmonary effort is normal.     Breath sounds: Normal breath sounds.  Abdominal:     General: Bowel sounds are normal.     Palpations: Abdomen is soft.  Musculoskeletal: Normal range of motion.        General: No tenderness or deformity.  Lymphadenopathy:     Cervical: No cervical adenopathy.  Skin:    General: Skin is warm and dry.     Findings: No erythema or rash.  Neurological:     Mental Status: She is alert and oriented to person, place, and time.  Psychiatric:        Behavior: Behavior normal.        Thought Content: Thought content normal.        Judgment: Judgment normal.      Lab Results  Component Value Date   WBC 6.9 08/09/2018   HGB 15.6 (H) 08/09/2018   HCT 46.0 08/09/2018   MCV 88.6 08/09/2018   PLT 182 08/09/2018     Chemistry      Component Value Date/Time   NA 143 05/03/2018 1141   NA 145 08/03/2017 0905   NA 140 07/19/2016 0834   K 3.4 (L) 05/03/2018 1141   K 3.6 08/03/2017 0905   K 3.2 (L) 07/19/2016 0834   CL 102 05/03/2018 1141   CL 102 08/03/2017 0905   CO2 27 05/03/2018 1141   CO2 31 08/03/2017 0905   CO2 27 07/19/2016 0834   BUN 11 05/03/2018 1141   BUN 9 08/03/2017 0905   BUN 11.0 07/19/2016 0834   CREATININE 0.79 05/03/2018 1141   CREATININE 0.90 02/08/2018 0906   CREATININE 0.8 08/03/2017 0905   CREATININE 0.8 07/19/2016 0834      Component Value Date/Time   CALCIUM 9.5 05/03/2018 1141   CALCIUM 10.3 08/03/2017 0905   CALCIUM 10.1 07/19/2016 0834   ALKPHOS 76 05/03/2018 1141   ALKPHOS 84 08/03/2017 0905   ALKPHOS 85 07/19/2016 0834   AST 17 05/03/2018 1141   AST 29 02/08/2018 0906   AST 26 07/19/2016 0834   ALT 16 05/03/2018 1141   ALT 28 02/08/2018 0906   ALT 39 08/03/2017 0905   ALT 25 07/19/2016 0834   BILITOT 0.3 05/03/2018 1141   BILITOT 0.7 02/08/2018 0906   BILITOT 0.81 07/19/2016 0834         Impression and Plan: Ms. Sarah Garrett is 58 year old white female. She has hereditary  hemochromatosis. 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  I am so happy for her.  This will be her first grandchild.  She is certainly looking forward to this.  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  For right now, everything looks okay.  We will see what her iron studies show.  I will check her TSH.  She is gained a little weight.  She does feel tired.  Will see that her TSH is monitored.  I will plan to get her back in 6 more months.  [name], MD 1/10/20209:29 AM

## 2018-09-25 ENCOUNTER — Ambulatory Visit: Payer: BLUE CROSS/BLUE SHIELD

## 2018-09-25 DIAGNOSIS — J208 Acute bronchitis due to other specified organisms: Secondary | ICD-10-CM | POA: Diagnosis not present

## 2018-09-25 DIAGNOSIS — R5383 Other fatigue: Secondary | ICD-10-CM | POA: Diagnosis not present

## 2018-09-25 DIAGNOSIS — B9689 Other specified bacterial agents as the cause of diseases classified elsewhere: Secondary | ICD-10-CM | POA: Diagnosis not present

## 2018-09-25 DIAGNOSIS — J45909 Unspecified asthma, uncomplicated: Secondary | ICD-10-CM | POA: Diagnosis not present

## 2018-09-25 DIAGNOSIS — R5381 Other malaise: Secondary | ICD-10-CM | POA: Diagnosis not present

## 2018-09-28 ENCOUNTER — Other Ambulatory Visit: Payer: Self-pay | Admitting: Family Medicine

## 2018-10-02 ENCOUNTER — Other Ambulatory Visit: Payer: BLUE CROSS/BLUE SHIELD

## 2018-10-02 DIAGNOSIS — I10 Essential (primary) hypertension: Secondary | ICD-10-CM

## 2018-10-02 DIAGNOSIS — E034 Atrophy of thyroid (acquired): Secondary | ICD-10-CM

## 2018-10-02 DIAGNOSIS — E559 Vitamin D deficiency, unspecified: Secondary | ICD-10-CM

## 2018-10-02 DIAGNOSIS — E7800 Pure hypercholesterolemia, unspecified: Secondary | ICD-10-CM

## 2018-10-02 DIAGNOSIS — K219 Gastro-esophageal reflux disease without esophagitis: Secondary | ICD-10-CM

## 2018-10-03 ENCOUNTER — Other Ambulatory Visit: Payer: Self-pay | Admitting: *Deleted

## 2018-10-03 LAB — LIPID PANEL
CHOL/HDL RATIO: 3.5 ratio (ref 0.0–4.4)
Cholesterol, Total: 139 mg/dL (ref 100–199)
HDL: 40 mg/dL (ref >39)
LDL Calculated: 51 mg/dL (ref 0–99)
Triglycerides: 242 mg/dL — ABNORMAL HIGH (ref 0–149)
VLDL Cholesterol Cal: 48 mg/dL — ABNORMAL HIGH (ref 5–40)

## 2018-10-03 LAB — THYROID PANEL WITH TSH
Free Thyroxine Index: 3.3 (ref 1.2–4.9)
T3 UPTAKE RATIO: 30 % (ref 24–39)
T4, Total: 10.9 ug/dL (ref 4.5–12.0)
TSH: 1.11 u[IU]/mL (ref 0.450–4.500)

## 2018-10-03 LAB — CBC WITH DIFFERENTIAL/PLATELET
Basophils Absolute: 0.1 10*3/uL (ref 0.0–0.2)
Basos: 1 %
EOS (ABSOLUTE): 0.4 10*3/uL (ref 0.0–0.4)
Eos: 4 %
Hematocrit: 47 % — ABNORMAL HIGH (ref 34.0–46.6)
Hemoglobin: 15.7 g/dL (ref 11.1–15.9)
Immature Grans (Abs): 0.1 10*3/uL (ref 0.0–0.1)
Immature Granulocytes: 1 %
LYMPHS ABS: 3.6 10*3/uL — AB (ref 0.7–3.1)
Lymphs: 42 %
MCH: 30.3 pg (ref 26.6–33.0)
MCHC: 33.4 g/dL (ref 31.5–35.7)
MCV: 91 fL (ref 79–97)
MONOS ABS: 0.5 10*3/uL (ref 0.1–0.9)
Monocytes: 6 %
Neutrophils Absolute: 4.1 10*3/uL (ref 1.4–7.0)
Neutrophils: 46 %
PLATELETS: 210 10*3/uL (ref 150–450)
RBC: 5.18 x10E6/uL (ref 3.77–5.28)
RDW: 14.7 % (ref 11.7–15.4)
WBC: 8.7 10*3/uL (ref 3.4–10.8)

## 2018-10-03 LAB — BMP8+EGFR
BUN / CREAT RATIO: 16 (ref 9–23)
BUN: 14 mg/dL (ref 6–24)
CHLORIDE: 99 mmol/L (ref 96–106)
CO2: 28 mmol/L (ref 20–29)
Calcium: 9.4 mg/dL (ref 8.7–10.2)
Creatinine, Ser: 0.87 mg/dL (ref 0.57–1.00)
GFR calc Af Amer: 84 mL/min/{1.73_m2} (ref >59)
GFR calc non Af Amer: 73 mL/min/{1.73_m2} (ref >59)
Glucose: 86 mg/dL (ref 65–99)
Potassium: 3.5 mmol/L (ref 3.5–5.2)
Sodium: 145 mmol/L — ABNORMAL HIGH (ref 134–144)

## 2018-10-03 LAB — HEPATIC FUNCTION PANEL
ALT: 22 IU/L (ref 0–32)
AST: 19 IU/L (ref 0–40)
Albumin: 4.4 g/dL (ref 3.8–4.9)
Alkaline Phosphatase: 71 IU/L (ref 39–117)
Bilirubin Total: 0.4 mg/dL (ref 0.0–1.2)
Bilirubin, Direct: 0.15 mg/dL (ref 0.00–0.40)
Total Protein: 7 g/dL (ref 6.0–8.5)

## 2018-10-03 LAB — VITAMIN D 25 HYDROXY (VIT D DEFICIENCY, FRACTURES): Vit D, 25-Hydroxy: 64.1 ng/mL (ref 30.0–100.0)

## 2018-10-03 MED ORDER — ICOSAPENT ETHYL 1 G PO CAPS: 1.0000 g | ORAL_CAPSULE | Freq: Two times a day (BID) | ORAL | 3 refills | Status: DC

## 2018-10-07 ENCOUNTER — Ambulatory Visit (INDEPENDENT_AMBULATORY_CARE_PROVIDER_SITE_OTHER): Payer: BLUE CROSS/BLUE SHIELD | Admitting: Family Medicine

## 2018-10-07 ENCOUNTER — Encounter: Payer: Self-pay | Admitting: Family Medicine

## 2018-10-07 VITALS — BP 126/83 | HR 79 | Temp 97.8°F | Ht 63.0 in | Wt 201.0 lb

## 2018-10-07 DIAGNOSIS — I10 Essential (primary) hypertension: Secondary | ICD-10-CM

## 2018-10-07 DIAGNOSIS — E7800 Pure hypercholesterolemia, unspecified: Secondary | ICD-10-CM

## 2018-10-07 DIAGNOSIS — C4361 Malignant melanoma of right upper limb, including shoulder: Secondary | ICD-10-CM

## 2018-10-07 DIAGNOSIS — K219 Gastro-esophageal reflux disease without esophagitis: Secondary | ICD-10-CM

## 2018-10-07 DIAGNOSIS — E034 Atrophy of thyroid (acquired): Secondary | ICD-10-CM

## 2018-10-07 DIAGNOSIS — E559 Vitamin D deficiency, unspecified: Secondary | ICD-10-CM | POA: Diagnosis not present

## 2018-10-07 DIAGNOSIS — J9801 Acute bronchospasm: Secondary | ICD-10-CM

## 2018-10-07 MED ORDER — FUROSEMIDE 40 MG PO TABS: 40.0000 mg | ORAL_TABLET | Freq: Every day | ORAL | 3 refills | Status: DC | PRN

## 2018-10-07 MED ORDER — LEVOTHYROXINE SODIUM 125 MCG PO TABS: 125.0000 ug | ORAL_TABLET | Freq: Every day | ORAL | 3 refills | Status: DC

## 2018-10-07 MED ORDER — LOSARTAN POTASSIUM-HCTZ 100-12.5 MG PO TABS: 1.0000 | ORAL_TABLET | Freq: Every day | ORAL | 3 refills | Status: DC

## 2018-10-07 MED ORDER — ICOSAPENT ETHYL 1 G PO CAPS: 1.0000 g | ORAL_CAPSULE | Freq: Two times a day (BID) | ORAL | 3 refills | Status: DC

## 2018-10-07 MED ORDER — ALBUTEROL SULFATE HFA 108 (90 BASE) MCG/ACT IN AERS: INHALATION_SPRAY | RESPIRATORY_TRACT | 11 refills | Status: DC

## 2018-10-07 MED ORDER — MONTELUKAST SODIUM 10 MG PO TABS: ORAL_TABLET | ORAL | 3 refills | Status: DC

## 2018-10-07 MED ORDER — VITAMIN D (ERGOCALCIFEROL) 1.25 MG (50000 UNIT) PO CAPS: 50000.0000 [IU] | ORAL_CAPSULE | ORAL | 3 refills | Status: DC

## 2018-10-07 MED ORDER — ROSUVASTATIN CALCIUM 20 MG PO TABS: ORAL_TABLET | ORAL | 3 refills | Status: DC

## 2018-10-07 MED ORDER — FLUTICASONE-SALMETEROL 100-50 MCG/DOSE IN AEPB: INHALATION_SPRAY | RESPIRATORY_TRACT | 3 refills | Status: DC

## 2018-10-07 MED ORDER — POTASSIUM CHLORIDE ER 10 MEQ PO TBCR: 10.0000 meq | EXTENDED_RELEASE_TABLET | Freq: Every day | ORAL | 3 refills | Status: DC

## 2018-10-07 NOTE — Progress Notes (Signed)
Subjective:    Patient ID: Sarah Garrett, female    DOB: August 02, 1960, 58 y.o.   MRN: [id number]  HPI Pt here for follow up and management of chronic medical problems which includes hypothyroid and hypertension. She is taking medication regularly.  The patient is doing well overall but did not go to do her sleep test.  She is also been seeing orthopedic for her joint pains.  She is requesting refills on several of her medicines.  She has had blood work done and this will be reviewed with her during the visit today.  Her vitamin D level was good at 64.1.  The white blood cell count was good at 8.7 and hemoglobin was good at 15.7 with hematocrit being 47.0 and slightly elevated.  The platelet count was normal.  The blood sugar was good at 86.  The creatinine was good at 0.87 and stable.  The sodium was slightly elevated by one-point at 145 potassium and the remainder of the electrolytes were good.  Triglycerides were very elevated this time at 242.  The good cholesterol was good at 40 and the LDL-C or bad cholesterol was good at 51.  All thyroid tests were normal.  All liver function tests were normal.  Patient is pleasant and doing well.  She reports a history of having cough and congestion and took a round of prednisone and a Z-Pak a couple weeks ago.  This prednisone may have certainly play a role with her elevated triglycerides noted on the recent lab work.  She is better from the cold and cough symptoms.  She denies any chest pain today or shortness of breath today.  She denies any trouble with swallowing heartburn indigestion nausea vomiting diarrhea blood in the stool or black tarry bowel movements.  She is passing her water well.  She had her last colonoscopy in May 2013 and was told to come back again in 10 years.  There is no family history of colon cancer.  She has not scheduled her sleep apnea evaluation and does plan to do that.  She is seeing orthopedics because of problems with her back and is  currently doing better with that but they are considering injections if she does not get better.     Patient Active Problem List   Diagnosis Date Noted  . Contact dermatitis 03/24/2013  . Asthma 03/24/2013  . Melanoma of upper arm, right. T1a., History of 09/07/2011  . Hemochromatosis 07/07/2011  . Hypothyroidism 02/17/2008  . Hyperlipidemia 10/11/2007  . ANEMIA-NOS 10/11/2007  . Essential hypertension 10/11/2007  . ALLERGIC RHINITIS 10/11/2007  . BRONCHITIS, CHRONIC 10/11/2007  . GERD 10/11/2007  . ADENOIDECTOMY, HX OF 10/11/2007   Outpatient Encounter Medications as of 10/07/2018  Medication Sig  . ADVAIR DISKUS 100-50 MCG/DOSE AEPB TAKE 1 PUFF BY MOUTH TWICE A DAY  . albuterol (PROVENTIL HFA;VENTOLIN HFA) 108 (90 Base) MCG/ACT inhaler TAKE 2 PUFFS BY MOUTH EVERY 6 HOURS AS NEEDED  . cetirizine (ZYRTEC) 10 MG tablet Take 10 mg by mouth daily.    Marland Kitchen esomeprazole (NEXIUM) 40 MG capsule Take 1 capsule (40 mg total) by mouth as needed.  . furosemide (LASIX) 40 MG tablet TAKE 1 TABLET BY MOUTH EVERY DAY AS NEEDED  . hyoscyamine (LEVSIN SL) 0.125 MG SL tablet Place 1 tablet (0.125 mg total) under the tongue as needed. Place 1-2 tablet under the tongue until it dissolves as needed  . Icosapent Ethyl (VASCEPA) 1 g CAPS Take 1 capsule (1 g total)  by mouth 2 (two) times daily.  Marland Kitchen levothyroxine (SYNTHROID, LEVOTHROID) 125 MCG tablet TAKE 1 TABLET BY MOUTH EVERY DAY  . losartan-hydrochlorothiazide (HYZAAR) 100-12.5 MG tablet Take 1 tablet by mouth daily. as directed  . meclizine (ANTIVERT) 25 MG tablet Take 1 tablet (25 mg total) by mouth 3 (three) times daily as needed for dizziness.  . montelukast (SINGULAIR) 10 MG tablet TAKE 1 TABLET BY MOUTH EVERYDAY AT BEDTIME  . potassium chloride (K-DUR) 10 MEQ tablet Take 1 tablet (10 mEq total) by mouth daily.  . rosuvastatin (CRESTOR) 20 MG tablet TAKE 1 TABLET (20 MG TOTAL) BY MOUTH DAILY.  Marland Kitchen Vitamin D, Ergocalciferol, (DRISDOL) 50000 units CAPS  capsule Take 1 capsule (50,000 Units total) by mouth every 7 (seven) days.   No facility-administered encounter medications on file as of 10/07/2018.      Review of Systems  Constitutional: Negative.   HENT: Negative.   Eyes: Negative.   Respiratory: Negative.   Cardiovascular: Negative.   Gastrointestinal: Negative.   Endocrine: Negative.   Genitourinary: Negative.   Musculoskeletal: Positive for back pain (on going - seen ortho,  had therapy , and chiroprator).  Skin: Negative.   Allergic/Immunologic: Negative.   Neurological: Negative.   Hematological: Negative.   Psychiatric/Behavioral: Negative.        Objective:   Physical Exam Vitals signs and nursing note reviewed.  Constitutional:      General: She is not in acute distress.    Appearance: Normal appearance. She is well-developed. She is obese.     Comments: Patient is pleasant and alert.  HENT:     Head: Normocephalic and atraumatic.     Right Ear: Tympanic membrane, ear canal and external ear normal. There is no impacted cerumen.     Left Ear: Tympanic membrane, ear canal and external ear normal. There is no impacted cerumen.     Nose: Congestion present.     Comments: Nasal turbinate congestion bilaterally    Mouth/Throat:     Mouth: Mucous membranes are moist.     Pharynx: Oropharynx is clear.  Eyes:     General: No scleral icterus.       Right eye: No discharge.        Left eye: No discharge.     Extraocular Movements: Extraocular movements intact.     Conjunctiva/sclera: Conjunctivae normal.     Pupils: Pupils are equal, round, and reactive to light.  Neck:     Musculoskeletal: Normal range of motion and neck supple.     Thyroid: No thyromegaly.     Vascular: No carotid bruit or JVD.     Comments: No thyromegaly anterior cervical adenopathy or bruits Cardiovascular:     Rate and Rhythm: Normal rate and regular rhythm.     Heart sounds: Normal heart sounds. No murmur.     Comments: Heart is regular at  72/min with good pedal pulses and no edema Pulmonary:     Effort: Pulmonary effort is normal.     Breath sounds: Normal breath sounds. No wheezing, rhonchi or rales.     Comments: Dry cough without wheezes or rhonchi Abdominal:     General: Bowel sounds are normal.     Palpations: Abdomen is soft. There is no mass.     Tenderness: There is no abdominal tenderness. There is no guarding.     Comments: No masses tenderness organ enlargement or bruits.  Musculoskeletal: Normal range of motion.        General: No tenderness.  Right lower leg: No edema.     Left lower leg: No edema.  Lymphadenopathy:     Cervical: No cervical adenopathy.  Skin:    General: Skin is warm and dry.     Findings: No rash.  Neurological:     General: No focal deficit present.     Mental Status: She is alert and oriented to person, place, and time. Mental status is at baseline.     Cranial Nerves: No cranial nerve deficit.     Motor: No weakness.     Gait: Gait normal.     Deep Tendon Reflexes: Reflexes are normal and symmetric. Reflexes normal.  Psychiatric:        Mood and Affect: Mood normal.        Behavior: Behavior normal.        Thought Content: Thought content normal.        Judgment: Judgment normal.     Comments: Mood affect and behavior are all normal for this patient     BP 126/83 (BP Location: Left Arm)   Pulse 79   Temp 97.8 F (36.6 C) (Oral)   Ht 5\' 3"  (1.6 m)   Wt 201 lb (91.2 kg)   LMP 11/29/2011   BMI 35.61 kg/m        Assessment & Plan:  1. Hypothyroidism due to acquired atrophy of thyroid -Continue with current thyroid treatment  2. Vitamin D deficiency -Continue with vitamin D replacement  3. Pure hypercholesterolemia -Triglycerides were elevated this time.  The patient has been on a course of prednisone and this may account for the elevated triglycerides as she has been taking her Vascepa on a regular basis along with her Crestor.  4. Essential  hypertension -Blood pressure is good today and she will continue with current treatment  5. Gastroesophageal reflux disease, esophagitis presence not specified -Continue with Nexium and diet of avoidance  6. Hemochromatosis -Follow-up with Dr. Marin Olp this summer as planned - albuterol (PROVENTIL HFA;VENTOLIN HFA) 108 (90 Base) MCG/ACT inhaler; TAKE 2 PUFFS BY MOUTH EVERY 6 HOURS AS NEEDED  Dispense: 18 Inhaler; Refill: 11  7. Bronchospasm -Use inhalers regularly drink plenty of fluids and avoid allergens as much as possible  8. Hereditary hemochromatosis ([place]) -Follow-up with Dr. Marin Olp  9. Morbid obesity ([place]) -Patient has a BMI at 35 or greater and has 2 or more comorbid health conditions and is therefore considered morbidly obese and she was encouraged to make every effort possible to lose weight through diet and exercise  10. Melanoma of upper arm, right [facility]) -Follow-up with dermatology as planned  Meds ordered this encounter  Medications  . potassium chloride (K-DUR) 10 MEQ tablet    Sig: Take 1 tablet (10 mEq total) by mouth daily.    Dispense:  90 tablet    Refill:  3  . rosuvastatin (CRESTOR) 20 MG tablet    Sig: TAKE 1 TABLET (20 MG TOTAL) BY MOUTH DAILY.    Dispense:  90 tablet    Refill:  3  . Vitamin D, Ergocalciferol, (DRISDOL) 1.25 MG (50000 UT) CAPS capsule    Sig: Take 1 capsule (50,000 Units total) by mouth every 7 (seven) days.    Dispense:  12 capsule    Refill:  3  . albuterol (PROVENTIL HFA;VENTOLIN HFA) 108 (90 Base) MCG/ACT inhaler    Sig: TAKE 2 PUFFS BY MOUTH EVERY 6 HOURS AS NEEDED    Dispense:  18 Inhaler    Refill:  11  .  montelukast (SINGULAIR) 10 MG tablet    Sig: TAKE 1 TABLET BY MOUTH EVERYDAY AT BEDTIME    Dispense:  90 tablet    Refill:  3  . losartan-hydrochlorothiazide (HYZAAR) 100-12.5 MG tablet    Sig: Take 1 tablet by mouth daily. as directed    Dispense:  90 tablet    Refill:  3  . levothyroxine (SYNTHROID, LEVOTHROID) 125 MCG  tablet    Sig: Take 1 tablet (125 mcg total) by mouth daily.    Dispense:  90 tablet    Refill:  3  . Icosapent Ethyl (VASCEPA) 1 g CAPS    Sig: Take 1 capsule (1 g total) by mouth 2 (two) times daily.    Dispense:  180 capsule    Refill:  3  . furosemide (LASIX) 40 MG tablet    Sig: Take 1 tablet (40 mg total) by mouth daily as needed.    Dispense:  90 tablet    Refill:  3  . Fluticasone-Salmeterol (ADVAIR DISKUS) 100-50 MCG/DOSE AEPB    Sig: TAKE 1 PUFF BY MOUTH TWICE A DAY    Dispense:  180 each    Refill:  3    PT WOULD LIKE REFILLS PLEASE   Patient Instructions  Continue current medications. Continue good therapeutic lifestyle changes which include good diet and exercise. Fall precautions discussed with patient. If an FOBT was given today- please return it to our front desk. If you are over 27 years old - you may need Prevnar 43 or the adult Pneumonia vaccine.  **Flu shots are available--- please call and schedule a FLU-CLINIC appointment**  After your visit with [place] today you will receive a survey in the mail or online from [employer] regarding your care with [place]. Please take a moment to fill this out. Your feedback is very important to [place] as you can help [place] better understand your patient needs as well as improve your experience and satisfaction. WE CARE ABOUT YOU!!!   Continue to drink plenty of fluids and stay well-hydrated Practice good hand and respiratory hygiene Use inhalers regularly Avoid crowds of people Use allergy medicines regularly for the upcoming allergy season Make all efforts to lose weight through diet and exercise Not forget to schedule your sleep apnea evaluation and if you have problems doing this call us and we will help you Follow-up with orthopedics as planned  Arrie Senate MD

## 2018-10-07 NOTE — Patient Instructions (Addendum)
Continue current medications. Continue good therapeutic lifestyle changes which include good diet and exercise. Fall precautions discussed with patient. If an FOBT was given today- please return it to our front desk. If you are over 60 years old - you may need Prevnar 43 or the adult Pneumonia vaccine.  **Flu shots are available--- please call and schedule a FLU-CLINIC appointment**  After your visit with [place] today you will receive a survey in the mail or online from [employer] regarding your care with [place]. Please take a moment to fill this out. Your feedback is very important to [place] as you can help [place] better understand your patient needs as well as improve your experience and satisfaction. WE CARE ABOUT YOU!!!   Continue to drink plenty of fluids and stay well-hydrated Practice good hand and respiratory hygiene Use inhalers regularly Avoid crowds of people Use allergy medicines regularly for the upcoming allergy season Make all efforts to lose weight through diet and exercise Not forget to schedule your sleep apnea evaluation and if you have problems doing this call us and we will help you Follow-up with orthopedics as planned

## 2018-12-13 ENCOUNTER — Ambulatory Visit (INDEPENDENT_AMBULATORY_CARE_PROVIDER_SITE_OTHER): Payer: BLUE CROSS/BLUE SHIELD | Admitting: Family Medicine

## 2018-12-13 ENCOUNTER — Encounter: Payer: Self-pay | Admitting: Family Medicine

## 2018-12-13 ENCOUNTER — Telehealth: Payer: Self-pay | Admitting: Family Medicine

## 2018-12-13 ENCOUNTER — Other Ambulatory Visit: Payer: Self-pay

## 2018-12-13 VITALS — BP 131/79 | HR 81 | Temp 97.9°F | Ht 63.0 in | Wt 200.0 lb

## 2018-12-13 DIAGNOSIS — L239 Allergic contact dermatitis, unspecified cause: Secondary | ICD-10-CM

## 2018-12-13 MED ORDER — METHYLPREDNISOLONE ACETATE 80 MG/ML IJ SUSP
80.0000 mg | Freq: Once | INTRAMUSCULAR | Status: AC
  Administered 2018-12-13: 80 mg via INTRAMUSCULAR

## 2018-12-13 MED ORDER — HYDROXYZINE HCL 50 MG PO TABS: 50.0000 mg | ORAL_TABLET | Freq: Three times a day (TID) | ORAL | 0 refills | Status: DC | PRN

## 2018-12-13 MED ORDER — FAMOTIDINE 20 MG PO TABS: 20.0000 mg | ORAL_TABLET | Freq: Two times a day (BID) | ORAL | 0 refills | Status: DC

## 2018-12-13 MED ORDER — TRIAMCINOLONE ACETONIDE 0.1 % EX CREA: 1.0000 "application " | TOPICAL_CREAM | Freq: Two times a day (BID) | CUTANEOUS | 0 refills | Status: DC

## 2018-12-13 NOTE — Telephone Encounter (Signed)
Pt is calling states that she has rash on legs, offered pt an apt she denied states that she didn't want to schedule until after she spoke to [place]. States that the rash could be coming from a medication again offered an apt she still denied until she spoke with [place].

## 2018-12-13 NOTE — Telephone Encounter (Signed)
Has raised faint rash on one leg and a small amount on the other leg.  She thinks it could be from a new razor she bought that "doesn't use water" appt made - may need steroid shot.

## 2018-12-13 NOTE — Patient Instructions (Signed)
Contact Dermatitis  Dermatitis is redness, soreness, and swelling (inflammation) of the skin. Contact dermatitis is a reaction to something that touches the skin.  There are two types of contact dermatitis:   Irritant contact dermatitis. This happens when something bothers (irritates) your skin, like soap.   Allergic contact dermatitis. This is caused when you are exposed to something that you are allergic to, such as poison ivy.  What are the causes?   Common causes of irritant contact dermatitis include:  ? Makeup.  ? Soaps.  ? Detergents.  ? Bleaches.  ? Acids.  ? Metals, such as nickel.   Common causes of allergic contact dermatitis include:  ? Plants.  ? Chemicals.  ? Jewelry.  ? Latex.  ? Medicines.  ? Preservatives in products, such as clothing.  What increases the risk?   Having a job that exposes you to things that bother your skin.   Having asthma or eczema.  What are the signs or symptoms?  Symptoms may happen anywhere the irritant has touched your skin. Symptoms include:   Dry or flaky skin.   Redness.   Cracks.   Itching.   Pain or a burning feeling.   Blisters.   Blood or clear fluid draining from skin cracks.  With allergic contact dermatitis, swelling may occur. This may happen in places such as the eyelids, mouth, or genitals.  How is this treated?   This condition is treated by checking for the cause of the reaction and protecting your skin. Treatment may also include:  ? Steroid creams, ointments, or medicines.  ? Antibiotic medicines or other ointments, if you have a skin infection.  ? Lotion or medicines to help with itching.  ? A bandage (dressing).  Follow these instructions at home:  Skin care   Moisturize your skin as needed.   Put cool cloths on your skin.   Put a baking soda paste on your skin. Stir water into baking soda until it looks like a paste.   Do not scratch your skin.   Avoid having things rub up against your skin.   Avoid the use of soaps, perfumes, and  dyes.  Medicines   Take or apply over-the-counter and prescription medicines only as told by your doctor.   If you were prescribed an antibiotic medicine, take or apply it as told by your doctor. Do not stop using it even if your condition starts to get better.  Bathing   Take a bath with:  ? Epsom salts.  ? Baking soda.  ? Colloidal oatmeal.   Bathe less often.   Bathe in warm water. Avoid using hot water.  Bandage care   If you were given a bandage, change it as told by your health care provider.   Wash your hands with soap and water before and after you change your bandage. If soap and water are not available, use hand sanitizer.  General instructions   Avoid the things that caused your reaction. If you do not know what caused it, keep a journal. Write down:  ? What you eat.  ? What skin products you use.  ? What you drink.  ? What you wear in the area that has symptoms. This includes jewelry.   Check the affected areas every day for signs of infection. Check for:  ? More redness, swelling, or pain.  ? More fluid or blood.  ? Warmth.  ? Pus or a bad smell.   Keep all follow-up visits as   told by your doctor. This is important.  Contact a doctor if:   You do not get better with treatment.   Your condition gets worse.   You have signs of infection, such as:  ? More swelling.  ? Tenderness.  ? More redness.  ? Soreness.  ? Warmth.   You have a fever.   You have new symptoms.  Get help right away if:   You have a very bad headache.   You have neck pain.   Your neck is stiff.   You throw up (vomit).   You feel very sleepy.   You see red streaks coming from the area.   Your bone or joint near the area hurts after the skin has healed.   The area turns darker.   You have trouble breathing.  Summary   Dermatitis is redness, soreness, and swelling of the skin.   Symptoms may occur where the irritant has touched you.   Treatment may include medicines and skin care.   If you do not know what caused  your reaction, keep a journal.   Contact a doctor if your condition gets worse or you have signs of infection.  This information is not intended to replace advice given to you by your health care provider. Make sure you discuss any questions you have with your health care provider.  Document Released: 05/14/2009 Document Revised: 01/30/2018 Document Reviewed: 01/30/2018  Elsevier Interactive Patient Education  2019 [employer]

## 2018-12-14 NOTE — Progress Notes (Signed)
Subjective:     Sarah Garrett is a 58 y.o. female who complains of a rash. Symptoms began several days ago. Patient describes the rash as erythematous, red, localized, maculopapular. Characteristics of rash and associated history: Similar rash in the past? yes, Is rash pruritic?  yes, Is rash painful?  no, Alleviating factors? Benadryl cream provides minimal relief, Aggravating factors? Scratching area, Skin exposure to potential irritants at home or work or from hobbies?unknown, Personal hx of allergies, asthma or hay fever? yes, Recent hiking?no, Recent travel? no, Recent exposure to animals? no, Related to temperature changes? no. Patient's previous dermatologic history includes bites, insect and dermatitis. Family history of derm problems: unknown. Medications currently using: benadryl and Zyrtec. Environmental exposures or allergies: none  The following portions of the patient's history were reviewed and updated as appropriate: allergies, current medications, past family history, past medical history, past social history, past surgical history and problem list.   Review of Systems Constitutional: negative Eyes: negative Ears, nose, mouth, throat, and face: negative Respiratory: negative Cardiovascular: negative Integument/breast: positive for pruritus, rash and skin color change Hematologic/lymphatic: negative Neurological: negative    Objective:    BP 131/79 (BP Location: Left Arm)   Pulse 81   Temp 97.9 F (36.6 C) (Oral)   Ht 5\' 3"  (1.6 m)   Wt 200 lb (90.7 kg)   LMP 11/29/2011   BMI 35.43 kg/m  Physical Exam  General:  alert, cooperative, appears stated age and mild distress  HEENT:  PERRLA, sclera clear, anicteric, oropharynx clear, no lesions, neck supple with midline trachea, thyroid without masses and trachea midline  Lymph Nodes:  Cervical, supraclavicular, and axillary nodes normal.  Lungs:  clear to auscultation bilaterally  Heart:  regular rate and rhythm,  S1, S2 normal, no murmur, click, rub or gallop  Extremities:  extremities normal, atraumatic, no cyanosis or edema  Skin:   Rash located on the right lower leg.   Shape:   raised   Consistency:   subcutaneous  Color:   red  Type: macule(s) - lower leg(s) right, papule(s) -lower leg(s) right  Size: severalmm    The remainder of the skin exam:  color normal, vascularity normal, no rashes or suspicious lesions, no evidence of bleeding or bruising, no rash, no edema, temperature normal, texture normal, mobility and turgor normal, nails normal without clubbing  Assessment:   Dustine was seen today for rash.  Diagnoses and all orders for this visit:  Allergic contact dermatitis, unspecified trigger Pruritic contact dermatitis, unknown trigger. Symptomatic care discussed. Cool compresses. Avoid scratching area. Medications as prescribed. Report any new or worsening symptoms.  -     hydrOXYzine (ATARAX/VISTARIL) 50 MG tablet; Take 1 tablet (50 mg total) by mouth 3 (three) times daily as needed. -     famotidine (PEPCID) 20 MG tablet; Take 1 tablet (20 mg total) by mouth 2 (two) times daily for 14 days. -     methylPREDNISolone acetate (DEPO-MEDROL) injection 80 mg -     triamcinolone cream (KENALOG) 0.1 %; Apply 1 application topically 2 (two) times daily.     Plan:    1.  benadryl, steroids: depo-medrol, triamcinolone and pepcid for 2 weeks 2.  verbal and written patient instruction given. 3. Follow up as needed for acute illness  or worsening symptoms.   Return if symptoms worsen or fail to improve.   The above assessment and management plan was discussed with the patient. The patient verbalized understanding of and has agreed  to the management plan. Patient is aware to call the clinic if symptoms fail to improve or worsen. Patient is aware when to return to the clinic for a follow-up visit. Patient educated on when it is appropriate to go to the emergency department.   Monia Pouch,  FNP-C [facility] [address] [place], [place] [address] [phone]

## 2019-01-08 ENCOUNTER — Telehealth: Payer: Self-pay | Admitting: Family Medicine

## 2019-01-08 DIAGNOSIS — L239 Allergic contact dermatitis, unspecified cause: Secondary | ICD-10-CM

## 2019-01-08 MED ORDER — TRIAMCINOLONE ACETONIDE 0.1 % EX CREA: 1.0000 "application " | TOPICAL_CREAM | Freq: Two times a day (BID) | CUTANEOUS | 0 refills | Status: DC

## 2019-01-08 NOTE — Telephone Encounter (Signed)
Aware. 

## 2019-01-08 NOTE — Telephone Encounter (Signed)
Refill sent.

## 2019-01-08 NOTE — Telephone Encounter (Signed)
Okay to refill x2

## 2019-01-08 NOTE — Telephone Encounter (Signed)
Refill requested

## 2019-02-07 ENCOUNTER — Other Ambulatory Visit: Payer: Self-pay

## 2019-02-07 ENCOUNTER — Encounter: Payer: Self-pay | Admitting: Family Medicine

## 2019-02-07 ENCOUNTER — Ambulatory Visit (INDEPENDENT_AMBULATORY_CARE_PROVIDER_SITE_OTHER): Payer: BC Managed Care – PPO | Admitting: Family Medicine

## 2019-02-07 DIAGNOSIS — L239 Allergic contact dermatitis, unspecified cause: Secondary | ICD-10-CM

## 2019-02-07 MED ORDER — PREDNISONE 20 MG PO TABS: ORAL_TABLET | ORAL | 0 refills | Status: DC

## 2019-02-07 MED ORDER — TRIAMCINOLONE ACETONIDE 0.1 % EX CREA: 1.0000 "application " | TOPICAL_CREAM | Freq: Two times a day (BID) | CUTANEOUS | 0 refills | Status: DC

## 2019-02-07 MED ORDER — FAMOTIDINE 20 MG PO TABS: 20.0000 mg | ORAL_TABLET | Freq: Two times a day (BID) | ORAL | 0 refills | Status: DC

## 2019-02-07 NOTE — Progress Notes (Signed)
Virtual Visit via telephone Note Due to COVID-19, visit is conducted virtually and was requested by patient. This visit type was conducted due to national recommendations for restrictions regarding the COVID-19 Pandemic (e.g. social distancing) in an effort to limit this patient's exposure and mitigate transmission in our community. All issues noted in this document were discussed and addressed.  A physical exam was not performed with this format.   I connected with Sarah Garrett on 02/07/19 at 1400 by telephone and verified that I am speaking with the correct person using two identifiers. Sarah Garrett is currently located at home and family is currently with them during visit. The provider, Monia Pouch, FNP is located in their office at time of visit.  I discussed the limitations, risks, security and privacy concerns of performing an evaluation and management service by telephone and the availability of in person appointments. I also discussed with the patient that there may be a patient responsible charge related to this service. The patient expressed understanding and agreed to proceed.  Subjective:  Patient ID: Sarah Garrett, female    DOB: [birth year], 58 y.o.   MRN: [id number]  Chief Complaint:  Rash   HPI: Sarah Garrett is a 58 y.o. female presenting on 02/07/2019 for Rash   Pt reports red, raised, pruritic rash to left leg. Pt states this started several days ago and is not getting better with topical creams. No know triggers or contact with new products. No known tick bites.   Rash This is a new problem. The current episode started in the past 7 days. The problem has been gradually worsening since onset. The affected locations include the left lower leg and left upper leg. The rash is characterized by itchiness, redness and swelling. She was exposed to nothing. Pertinent negatives include no anorexia, congestion, cough, diarrhea, eye pain, facial edema, fatigue,  fever, joint pain, nail changes, rhinorrhea, shortness of breath, sore throat or vomiting. Past treatments include topical steroids and antihistamine. The treatment provided no relief.     Relevant past medical, surgical, family, and social history reviewed and updated as indicated.  Allergies and medications reviewed and updated.   Past Medical History:  Diagnosis Date   Anemia    Diverticulosis    GERD (gastroesophageal reflux disease)    H/O adenoidectomy    Hemochromatosis 07/07/2011   Hyperlipidemia    Hypertension    Melanoma ([place])    right shoulder   Thyroid disease     Past Surgical History:  Procedure Laterality Date   BREAST BIOPSY  2004   left; benign   MELANOMA EXCISION  09/28/11   right shoulder   TONSILLECTOMY AND ADENOIDECTOMY      Social History   Socioeconomic History   Marital status: Married    Spouse name: Not on file   Number of children: Not on file   Years of education: Not on file   Highest education level: Not on file  Occupational History   Not on file  Social Needs   Financial resource strain: Not on file   Food insecurity    Worry: Not on file    Inability: Not on file   Transportation needs    Medical: Not on file    Non-medical: Not on file  Tobacco Use   Smoking status: Former Smoker    Packs/day: 0.25    Years: 3.00    Pack years: 0.75    Types: Cigarettes    Start date: 08/16/1974  Quit date: 01/14/1978    Years since quitting: 41.0   Smokeless tobacco: Never Used   Tobacco comment: quit 37 years ago  Substance and Sexual Activity   Alcohol use: No    Alcohol/week: 0.0 standard drinks   Drug use: No   Sexual activity: Not on file  Lifestyle   Physical activity    Days per week: Not on file    Minutes per session: Not on file   Stress: Not on file  Relationships   Social connections    Talks on phone: Not on file    Gets together: Not on file    Attends religious service: Not on file      Active member of club or organization: Not on file    Attends meetings of clubs or organizations: Not on file    Relationship status: Not on file   Intimate partner violence    Fear of current or ex partner: Not on file    Emotionally abused: Not on file    Physically abused: Not on file    Forced sexual activity: Not on file  Other Topics Concern   Not on file  Social History Narrative   Not on file    Outpatient Encounter Medications as of 02/07/2019  Medication Sig   albuterol (PROVENTIL HFA;VENTOLIN HFA) 108 (90 Base) MCG/ACT inhaler TAKE 2 PUFFS BY MOUTH EVERY 6 HOURS AS NEEDED   cetirizine (ZYRTEC) 10 MG tablet Take 10 mg by mouth daily.     esomeprazole (NEXIUM) 40 MG capsule Take 1 capsule (40 mg total) by mouth as needed.   famotidine (PEPCID) 20 MG tablet Take 1 tablet (20 mg total) by mouth 2 (two) times daily.   Fluticasone-Salmeterol (ADVAIR DISKUS) 100-50 MCG/DOSE AEPB TAKE 1 PUFF BY MOUTH TWICE A DAY   furosemide (LASIX) 40 MG tablet Take 1 tablet (40 mg total) by mouth daily as needed.   hydrOXYzine (ATARAX/VISTARIL) 50 MG tablet Take 1 tablet (50 mg total) by mouth 3 (three) times daily as needed.   hyoscyamine (LEVSIN SL) 0.125 MG SL tablet Place 1 tablet (0.125 mg total) under the tongue as needed. Place 1-2 tablet under the tongue until it dissolves as needed   Icosapent Ethyl (VASCEPA) 1 g CAPS Take 1 capsule (1 g total) by mouth 2 (two) times daily.   levothyroxine (SYNTHROID, LEVOTHROID) 125 MCG tablet Take 1 tablet (125 mcg total) by mouth daily.   losartan-hydrochlorothiazide (HYZAAR) 100-12.5 MG tablet Take 1 tablet by mouth daily. as directed   meclizine (ANTIVERT) 25 MG tablet Take 1 tablet (25 mg total) by mouth 3 (three) times daily as needed for dizziness.   montelukast (SINGULAIR) 10 MG tablet TAKE 1 TABLET BY MOUTH EVERYDAY AT BEDTIME   potassium chloride (K-DUR) 10 MEQ tablet Take 1 tablet (10 mEq total) by mouth daily.   predniSONE  (DELTASONE) 20 MG tablet 2 po at sametime daily for 5 days   rosuvastatin (CRESTOR) 20 MG tablet TAKE 1 TABLET (20 MG TOTAL) BY MOUTH DAILY.   triamcinolone cream (KENALOG) 0.1 % Apply 1 application topically 2 (two) times daily.   Vitamin D, Ergocalciferol, (DRISDOL) 1.25 MG (50000 UT) CAPS capsule Take 1 capsule (50,000 Units total) by mouth every 7 (seven) days.   [DISCONTINUED] famotidine (PEPCID) 20 MG tablet Take 1 tablet (20 mg total) by mouth 2 (two) times daily for 14 days.   [DISCONTINUED] triamcinolone cream (KENALOG) 0.1 % Apply 1 application topically 2 (two) times daily.   No facility-administered  encounter medications on file as of 02/07/2019.     Allergies  Allergen Reactions   Ace Inhibitors     Per [id number]   Actos [Pioglitazone Hydrochloride]     Per pt: unknown   Aspirin Swelling    Swelling of mouth and throat   Ciprofloxacin    Crestor [Rosuvastatin Calcium]     Per pt: unknown   Pneumococcal Vaccines     Red rash   Sulfonamide Derivatives     Per pt: unknown    Review of Systems  Constitutional: Negative for chills, fatigue and fever.  HENT: Negative for congestion, rhinorrhea and sore throat.   Eyes: Negative for pain.  Respiratory: Negative for cough, choking, shortness of breath, wheezing and stridor.   Cardiovascular: Negative for chest pain and palpitations.  Gastrointestinal: Negative for anorexia, diarrhea and vomiting.  Musculoskeletal: Negative for arthralgias, joint pain, joint swelling and myalgias.  Skin: Positive for rash. Negative for nail changes.  Neurological: Negative for weakness and headaches.  Psychiatric/Behavioral: Negative for confusion.  All other systems reviewed and are negative.        Observations/Objective: No vital signs or physical exam, this was a telephone or virtual health encounter.  Pt alert and oriented, answers all questions appropriately, and able to speak in full sentences.    Assessment and  Plan: Sarah Garrett was seen today for rash.  Diagnoses and all orders for this visit:  Allergic contact dermatitis, unspecified trigger  Reported symptoms consistent with dermatitis, unknown cause. Symptomatic care discussed. Cool compresses. Avoid scratching area. Continue benadryl or atarax as needed for pruritis. Medications as prescribed. Report any new or worsening symptoms. if symptoms persist will consider alpha gal panel and referral to dermatology.  -     predniSONE (DELTASONE) 20 MG tablet; 2 po at sametime daily for 5 days -     famotidine (PEPCID) 20 MG tablet; Take 1 tablet (20 mg total) by mouth 2 (two) times daily. -     triamcinolone cream (KENALOG) 0.1 %; Apply 1 application topically 2 (two) times daily.     Follow Up Instructions: Return if symptoms worsen or fail to improve.    I discussed the assessment and treatment plan with the patient. The patient was provided an opportunity to ask questions and all were answered. The patient agreed with the plan and demonstrated an understanding of the instructions.   The patient was advised to call back or seek an in-person evaluation if the symptoms worsen or if the condition fails to improve as anticipated.  The above assessment and management plan was discussed with the patient. The patient verbalized understanding of and has agreed to the management plan. Patient is aware to call the clinic if symptoms persist or worsen. Patient is aware when to return to the clinic for a follow-up visit. Patient educated on when it is appropriate to go to the emergency department.    I provided 15 minutes of non-face-to-face time during this encounter. The call started at 1400. The call ended at 1415. The other time was used for coordination of care.    Monia Pouch, FNP-C [facility] [address] [place], [place] [address] [phone]

## 2019-02-10 ENCOUNTER — Ambulatory Visit: Payer: BLUE CROSS/BLUE SHIELD | Admitting: Hematology & Oncology

## 2019-02-10 ENCOUNTER — Other Ambulatory Visit: Payer: BLUE CROSS/BLUE SHIELD

## 2019-02-14 ENCOUNTER — Inpatient Hospital Stay: Payer: BC Managed Care – PPO | Attending: Hematology & Oncology

## 2019-02-14 ENCOUNTER — Telehealth: Payer: Self-pay | Admitting: Hematology & Oncology

## 2019-02-14 ENCOUNTER — Inpatient Hospital Stay (HOSPITAL_BASED_OUTPATIENT_CLINIC_OR_DEPARTMENT_OTHER): Payer: BC Managed Care – PPO | Admitting: Family

## 2019-02-14 ENCOUNTER — Encounter: Payer: Self-pay | Admitting: *Deleted

## 2019-02-14 ENCOUNTER — Other Ambulatory Visit: Payer: Self-pay

## 2019-02-14 ENCOUNTER — Encounter: Payer: Self-pay | Admitting: Family

## 2019-02-14 DIAGNOSIS — Z79899 Other long term (current) drug therapy: Secondary | ICD-10-CM | POA: Diagnosis not present

## 2019-02-14 DIAGNOSIS — Z85820 Personal history of malignant melanoma of skin: Secondary | ICD-10-CM | POA: Diagnosis not present

## 2019-02-14 LAB — CBC WITH DIFFERENTIAL (CANCER CENTER ONLY)
Abs Immature Granulocytes: 0.04 10*3/uL (ref 0.00–0.07)
Basophils Absolute: 0.1 10*3/uL (ref 0.0–0.1)
Basophils Relative: 1 %
Eosinophils Absolute: 0.3 10*3/uL (ref 0.0–0.5)
Eosinophils Relative: 3 %
HCT: 44.5 % (ref 36.0–46.0)
Hemoglobin: 15.1 g/dL — ABNORMAL HIGH (ref 12.0–15.0)
Immature Granulocytes: 0 %
Lymphocytes Relative: 35 %
Lymphs Abs: 4.2 10*3/uL — ABNORMAL HIGH (ref 0.7–4.0)
MCH: 30.6 pg (ref 26.0–34.0)
MCHC: 33.9 g/dL (ref 30.0–36.0)
MCV: 90.1 fL (ref 80.0–100.0)
Monocytes Absolute: 0.7 10*3/uL (ref 0.1–1.0)
Monocytes Relative: 6 %
Neutro Abs: 6.8 10*3/uL (ref 1.7–7.7)
Neutrophils Relative %: 55 %
Platelet Count: 168 10*3/uL (ref 150–400)
RBC: 4.94 MIL/uL (ref 3.87–5.11)
RDW: 13.5 % (ref 11.5–15.5)
WBC Count: 12.2 10*3/uL — ABNORMAL HIGH (ref 4.0–10.5)
nRBC: 0 % (ref 0.0–0.2)

## 2019-02-14 LAB — CMP (CANCER CENTER ONLY)
ALT: 23 U/L (ref 0–44)
AST: 17 U/L (ref 15–41)
Albumin: 4.5 g/dL (ref 3.5–5.0)
Alkaline Phosphatase: 70 U/L (ref 38–126)
Anion gap: 10 (ref 5–15)
BUN: 14 mg/dL (ref 6–20)
CO2: 33 mmol/L — ABNORMAL HIGH (ref 22–32)
Calcium: 8.9 mg/dL (ref 8.9–10.3)
Chloride: 99 mmol/L (ref 98–111)
Creatinine: 0.83 mg/dL (ref 0.44–1.00)
GFR, Est AFR Am: >60 mL/min (ref >60)
GFR, Estimated: >60 mL/min (ref >60)
Glucose, Bld: 96 mg/dL (ref 70–99)
Potassium: 3.4 mmol/L — ABNORMAL LOW (ref 3.5–5.1)
Sodium: 142 mmol/L (ref 135–145)
Total Bilirubin: 0.6 mg/dL (ref 0.3–1.2)
Total Protein: 6.8 g/dL (ref 6.5–8.1)

## 2019-02-14 LAB — IRON AND TIBC
Iron: 83 ug/dL (ref 41–142)
Saturation Ratios: 24 % (ref 21–57)
TIBC: 340 ug/dL (ref 236–444)
UIBC: 257 ug/dL (ref 120–384)

## 2019-02-14 LAB — FERRITIN: Ferritin: 57 ng/mL (ref 11–307)

## 2019-02-14 NOTE — Telephone Encounter (Signed)
Appointments scheduled letter/calendar mailed per 7/17 los °

## 2019-02-14 NOTE — Progress Notes (Signed)
Hematology and Oncology Follow Up Visit  Sarah Garrett [id number] 09-01-1960 58 y.o. 02/14/2019   Principle Diagnosis:  Hemochromatosis (heterozygote mutation for C282Y and H63D) History of stage IA melanoma of the right arm  Current Therapy:   Phlebotomy to maintain ferritin less than 100 or iron saturation less than 30%   Interim History:  Sarah Garrett is here today for follow-up. She is doing well and staying busy with both work and projects around her home.  She denies excessive fatigue and is resting well at night.  No c/o pain. No swelling, tenderness, numbness or tingling in her extremities.  No fever, chills, n/v, cough, dizziness, headaches, blurred vision, SOB, chest pain, palpitations, abdominal pain/bloating or changes in bowel or bladder habits.  She has a rash on both legs which is currently being treated with prednisone (she finished first round yesterday), pepcid and Kenalog cream. The rash appears to be drying out nicely. She states that if it returns she will be referred to dermatology.  She has maintained a good appetite and is staying well hydrated. Her weight is stable.   ECOG Performance Status: 0 - Asymptomatic  Medications:  Allergies as of 02/14/2019      Reactions   Ace Inhibitors    Per WU:[id number]   Actos [pioglitazone Hydrochloride]    Per pt: unknown   Aspirin Swelling   Swelling of mouth and throat   Ciprofloxacin    Crestor [rosuvastatin Calcium]    Per pt: unknown   Pneumococcal Vaccines    Red rash   Sulfonamide Derivatives    Per pt: unknown      Medication List       Accurate as of February 14, 2019 10:20 AM. If you have any questions, ask your nurse or doctor.        albuterol 108 (90 Base) MCG/ACT inhaler Commonly known as: VENTOLIN HFA TAKE 2 PUFFS BY MOUTH EVERY 6 HOURS AS NEEDED   cetirizine 10 MG tablet Commonly known as: ZYRTEC Take 10 mg by mouth daily.   esomeprazole 40 MG capsule Commonly known as: NexIUM Take 1  capsule (40 mg total) by mouth as needed.   famotidine 20 MG tablet Commonly known as: Pepcid Take 1 tablet (20 mg total) by mouth 2 (two) times daily.   Fluticasone-Salmeterol 100-50 MCG/DOSE Aepb Commonly known as: Advair Diskus TAKE 1 PUFF BY MOUTH TWICE A DAY   furosemide 40 MG tablet Commonly known as: LASIX Take 1 tablet (40 mg total) by mouth daily as needed.   hydrOXYzine 50 MG tablet Commonly known as: ATARAX/VISTARIL Take 1 tablet (50 mg total) by mouth 3 (three) times daily as needed.   hyoscyamine 0.125 MG SL tablet Commonly known as: LEVSIN SL Place 1 tablet (0.125 mg total) under the tongue as needed. Place 1-2 tablet under the tongue until it dissolves as needed   Icosapent Ethyl 1 g Caps Commonly known as: Vascepa Take 1 capsule (1 g total) by mouth 2 (two) times daily.   levothyroxine 125 MCG tablet Commonly known as: SYNTHROID Take 1 tablet (125 mcg total) by mouth daily.   losartan-hydrochlorothiazide 100-12.5 MG tablet Commonly known as: HYZAAR Take 1 tablet by mouth daily. as directed   meclizine 25 MG tablet Commonly known as: ANTIVERT Take 1 tablet (25 mg total) by mouth 3 (three) times daily as needed for dizziness.   montelukast 10 MG tablet Commonly known as: SINGULAIR TAKE 1 TABLET BY MOUTH EVERYDAY AT BEDTIME   potassium chloride 10 MEQ  tablet Commonly known as: K-DUR Take 1 tablet (10 mEq total) by mouth daily.   predniSONE 20 MG tablet Commonly known as: Deltasone 2 po at sametime daily for 5 days   rosuvastatin 20 MG tablet Commonly known as: CRESTOR TAKE 1 TABLET (20 MG TOTAL) BY MOUTH DAILY.   triamcinolone cream 0.1 % Commonly known as: KENALOG Apply 1 application topically 2 (two) times daily.   Vitamin D (Ergocalciferol) 1.25 MG (50000 UT) Caps capsule Commonly known as: DRISDOL Take 1 capsule (50,000 Units total) by mouth every 7 (seven) days.       Allergies:  Allergies  Allergen Reactions  . Ace Inhibitors      Per XB:[id number]  . Actos [Pioglitazone Hydrochloride]     Per pt: unknown  . Aspirin Swelling    Swelling of mouth and throat  . Ciprofloxacin   . Crestor [Rosuvastatin Calcium]     Per pt: unknown  . Pneumococcal Vaccines     Red rash  . Sulfonamide Derivatives     Per pt: unknown    Past Medical History, Surgical history, Social history, and Family History were reviewed and updated.  Review of Systems: All other 10 point review of systems is negative.   Physical Exam:  vitals were not taken for this visit.   Wt Readings from Last 3 Encounters:  12/13/18 200 lb (90.7 kg)  10/07/18 201 lb (91.2 kg)  08/09/18 203 lb (92.1 kg)    Ocular: Sclerae unicteric, pupils equal, round and reactive to light Ear-nose-throat: Oropharynx clear, dentition fair Lymphatic: No cervical or supraclavicular adenopathy Lungs no rales or rhonchi, good excursion bilaterally Heart regular rate and rhythm, no murmur appreciated Abd soft, nontender, positive bowel sounds, no liver or spleen tip palpated on exam, no fluid wave  MSK no focal spinal tenderness, no joint edema Neuro: non-focal, well-oriented, appropriate affect Breasts: Deferred   Lab Results  Component Value Date   WBC 8.7 10/02/2018   HGB 15.7 10/02/2018   HCT 47.0 (H) 10/02/2018   MCV 91 10/02/2018   PLT 210 10/02/2018   Lab Results  Component Value Date   FERRITIN 56 08/09/2018   IRON 108 08/09/2018   TIBC 361 08/09/2018   UIBC 254 08/09/2018   IRONPCTSAT 30 08/09/2018   Lab Results  Component Value Date   RETICCTPCT 1.6 07/07/2011   RETICCTPCT 1.6 07/07/2011   RETICCTPCT 1.6 07/07/2011   RBC 5.18 10/02/2018   RETICCTABS 76.0 07/07/2011   RETICCTABS 76.0 07/07/2011   RETICCTABS 76.0 07/07/2011   No results found for: KPAFRELGTCHN, LAMBDASER, KAPLAMBRATIO No results found for: IGGSERUM, IGA, IGMSERUM No results found for: Kathrynn Ducking, MSPIKE, SPEI   Chemistry       Component Value Date/Time   NA 145 (H) 10/02/2018 1110   NA 145 08/03/2017 0905   NA 140 07/19/2016 0834   K 3.5 10/02/2018 1110   K 3.6 08/03/2017 0905   K 3.2 (L) 07/19/2016 0834   CL 99 10/02/2018 1110   CL 102 08/03/2017 0905   CO2 28 10/02/2018 1110   CO2 31 08/03/2017 0905   CO2 27 07/19/2016 0834   BUN 14 10/02/2018 1110   BUN 9 08/03/2017 0905   BUN 11.0 07/19/2016 0834   CREATININE 0.87 10/02/2018 1110   CREATININE 0.85 08/09/2018 0858   CREATININE 0.8 08/03/2017 0905   CREATININE 0.8 07/19/2016 0834      Component Value Date/Time   CALCIUM 9.4 10/02/2018 1110   CALCIUM 10.3  08/03/2017 0905   CALCIUM 10.1 07/19/2016 0834   ALKPHOS 71 10/02/2018 1110   ALKPHOS 84 08/03/2017 0905   ALKPHOS 85 07/19/2016 0834   AST 19 10/02/2018 1110   AST 21 08/09/2018 0858   AST 26 07/19/2016 0834   ALT 22 10/02/2018 1110   ALT 21 08/09/2018 0858   ALT 39 08/03/2017 0905   ALT 25 07/19/2016 0834   BILITOT 0.4 10/02/2018 1110   BILITOT 0.6 08/09/2018 0858   BILITOT 0.81 07/19/2016 0834       Impression and Plan: Sarah Garrett is a very pleasant 58 yo caucasian female with with hereditary hemochromatosis. She continues to do well.  We will see what her iron studies show and bring her back in for phlebotomy if needed.  We will see her back in another 6 months.  She will contact our office with any questions or concerns. We can certainly see her sooner if needed.   Laverna Peace, NP 7/17/202010:20 AM

## 2019-02-17 ENCOUNTER — Ambulatory Visit: Payer: BLUE CROSS/BLUE SHIELD | Admitting: Hematology & Oncology

## 2019-02-17 ENCOUNTER — Other Ambulatory Visit: Payer: BLUE CROSS/BLUE SHIELD

## 2019-03-01 ENCOUNTER — Other Ambulatory Visit: Payer: Self-pay | Admitting: Family Medicine

## 2019-03-01 DIAGNOSIS — L239 Allergic contact dermatitis, unspecified cause: Secondary | ICD-10-CM

## 2019-03-05 ENCOUNTER — Other Ambulatory Visit: Payer: Self-pay | Admitting: Family Medicine

## 2019-03-05 ENCOUNTER — Other Ambulatory Visit: Payer: BC Managed Care – PPO

## 2019-03-05 ENCOUNTER — Other Ambulatory Visit: Payer: Self-pay

## 2019-03-05 ENCOUNTER — Telehealth: Payer: Self-pay | Admitting: Family Medicine

## 2019-03-05 DIAGNOSIS — I10 Essential (primary) hypertension: Secondary | ICD-10-CM

## 2019-03-05 DIAGNOSIS — E7800 Pure hypercholesterolemia, unspecified: Secondary | ICD-10-CM

## 2019-03-05 DIAGNOSIS — E032 Hypothyroidism due to medicaments and other exogenous substances: Secondary | ICD-10-CM | POA: Diagnosis not present

## 2019-03-05 DIAGNOSIS — K219 Gastro-esophageal reflux disease without esophagitis: Secondary | ICD-10-CM | POA: Diagnosis not present

## 2019-03-05 NOTE — Telephone Encounter (Signed)
Pt was in this am for labs, she said she would discuss labs for rash with Rakes on 03/10/19

## 2019-03-06 LAB — CMP14+EGFR
ALT: 26 IU/L (ref 0–32)
AST: 29 IU/L (ref 0–40)
Albumin/Globulin Ratio: 2.2 (ref 1.2–2.2)
Albumin: 4.9 g/dL (ref 3.8–4.9)
Alkaline Phosphatase: 80 IU/L (ref 39–117)
BUN/Creatinine Ratio: 13 (ref 12–28)
BUN: 9 mg/dL (ref 8–27)
Bilirubin Total: 0.5 mg/dL (ref 0.0–1.2)
CO2: 27 mmol/L (ref 20–29)
Calcium: 10 mg/dL (ref 8.7–10.3)
Chloride: 98 mmol/L (ref 96–106)
Creatinine, Ser: 0.7 mg/dL (ref 0.57–1.00)
GFR calc Af Amer: 109 mL/min/{1.73_m2} (ref >59)
GFR calc non Af Amer: 94 mL/min/{1.73_m2} (ref >59)
Globulin, Total: 2.2 g/dL (ref 1.5–4.5)
Glucose: 85 mg/dL (ref 65–99)
Potassium: 3.4 mmol/L — ABNORMAL LOW (ref 3.5–5.2)
Sodium: 144 mmol/L (ref 134–144)
Total Protein: 7.1 g/dL (ref 6.0–8.5)

## 2019-03-06 LAB — LIPID PANEL
Chol/HDL Ratio: 3.2 ratio (ref 0.0–4.4)
Cholesterol, Total: 139 mg/dL (ref 100–199)
HDL: 43 mg/dL (ref >39)
LDL Calculated: 65 mg/dL (ref 0–99)
Triglycerides: 154 mg/dL — ABNORMAL HIGH (ref 0–149)
VLDL Cholesterol Cal: 31 mg/dL (ref 5–40)

## 2019-03-06 LAB — CBC WITH DIFFERENTIAL/PLATELET
Basophils Absolute: 0.1 10*3/uL (ref 0.0–0.2)
Basos: 1 %
EOS (ABSOLUTE): 0.3 10*3/uL (ref 0.0–0.4)
Eos: 4 %
Hematocrit: 44.8 % (ref 34.0–46.6)
Hemoglobin: 15.6 g/dL (ref 11.1–15.9)
Immature Grans (Abs): 0 10*3/uL (ref 0.0–0.1)
Immature Granulocytes: 0 %
Lymphocytes Absolute: 2.4 10*3/uL (ref 0.7–3.1)
Lymphs: 32 %
MCH: 30.8 pg (ref 26.6–33.0)
MCHC: 34.8 g/dL (ref 31.5–35.7)
MCV: 89 fL (ref 79–97)
Monocytes Absolute: 0.4 10*3/uL (ref 0.1–0.9)
Monocytes: 5 %
Neutrophils Absolute: 4.4 10*3/uL (ref 1.4–7.0)
Neutrophils: 58 %
Platelets: 204 10*3/uL (ref 150–450)
RBC: 5.06 x10E6/uL (ref 3.77–5.28)
RDW: 13.9 % (ref 11.7–15.4)
WBC: 7.5 10*3/uL (ref 3.4–10.8)

## 2019-03-06 LAB — THYROID PANEL WITH TSH
Free Thyroxine Index: 3.3 (ref 1.2–4.9)
T3 Uptake Ratio: 28 % (ref 24–39)
T4, Total: 11.7 ug/dL (ref 4.5–12.0)
TSH: 0.653 u[IU]/mL (ref 0.450–4.500)

## 2019-03-07 ENCOUNTER — Other Ambulatory Visit: Payer: Self-pay

## 2019-03-10 ENCOUNTER — Ambulatory Visit (INDEPENDENT_AMBULATORY_CARE_PROVIDER_SITE_OTHER): Payer: BC Managed Care – PPO | Admitting: Family Medicine

## 2019-03-10 ENCOUNTER — Ambulatory Visit: Payer: BLUE CROSS/BLUE SHIELD | Admitting: Family Medicine

## 2019-03-10 ENCOUNTER — Other Ambulatory Visit: Payer: Self-pay

## 2019-03-10 ENCOUNTER — Encounter: Payer: Self-pay | Admitting: Family Medicine

## 2019-03-10 VITALS — BP 110/71 | HR 80 | Temp 97.7°F | Ht 63.0 in | Wt 192.0 lb

## 2019-03-10 DIAGNOSIS — I10 Essential (primary) hypertension: Secondary | ICD-10-CM

## 2019-03-10 DIAGNOSIS — E032 Hypothyroidism due to medicaments and other exogenous substances: Secondary | ICD-10-CM | POA: Diagnosis not present

## 2019-03-10 DIAGNOSIS — E7800 Pure hypercholesterolemia, unspecified: Secondary | ICD-10-CM | POA: Diagnosis not present

## 2019-03-10 DIAGNOSIS — L239 Allergic contact dermatitis, unspecified cause: Secondary | ICD-10-CM | POA: Diagnosis not present

## 2019-03-10 DIAGNOSIS — K219 Gastro-esophageal reflux disease without esophagitis: Secondary | ICD-10-CM

## 2019-03-10 DIAGNOSIS — Z91018 Allergy to other foods: Secondary | ICD-10-CM

## 2019-03-10 DIAGNOSIS — R21 Rash and other nonspecific skin eruption: Secondary | ICD-10-CM | POA: Diagnosis not present

## 2019-03-10 DIAGNOSIS — W57XXXA Bitten or stung by nonvenomous insect and other nonvenomous arthropods, initial encounter: Secondary | ICD-10-CM

## 2019-03-10 MED ORDER — PREDNISONE 10 MG (21) PO TBPK: ORAL_TABLET | ORAL | 0 refills | Status: DC

## 2019-03-10 MED ORDER — FAMOTIDINE 20 MG PO TABS: 20.0000 mg | ORAL_TABLET | Freq: Two times a day (BID) | ORAL | 0 refills | Status: DC

## 2019-03-10 NOTE — Patient Instructions (Signed)
Contact Dermatitis Dermatitis is redness, soreness, and swelling (inflammation) of the skin. Contact dermatitis is a reaction to certain substances that touch the skin. Many different substances can cause contact dermatitis. There are two types of contact dermatitis:  Irritant contact dermatitis. This type is caused by something that irritates your skin, such as having dry hands from washing them too often with soap. This type does not require previous exposure to the substance for a reaction to occur. This is the most common type.  Allergic contact dermatitis. This type is caused by a substance that you are allergic to, such as poison ivy. This type occurs when you have been exposed to the substance (allergen) and develop a sensitivity to it. Dermatitis may develop soon after your first exposure to the allergen, or it may not develop until the next time you are exposed and every time thereafter. What are the causes? Irritant contact dermatitis is most commonly caused by exposure to:  Makeup.  Soaps.  Detergents.  Bleaches.  Acids.  Metal salts, such as nickel. Allergic contact dermatitis is most commonly caused by exposure to:  Poisonous plants.  Chemicals.  Jewelry.  Latex.  Medicines.  Preservatives in products, such as clothing. What increases the risk? You are more likely to develop this condition if you have:  A job that exposes you to irritants or allergens.  Certain medical conditions, such as asthma or eczema. What are the signs or symptoms? Symptoms of this condition may occur on your body anywhere the irritant has touched you or is touched by you.  Symptoms include: ? Dryness or flaking. ? Redness. ? Cracks. ? Itching. ? Pain or a burning feeling. ? Blisters. ? Drainage of small amounts of blood or clear fluid from skin cracks. With allergic contact dermatitis, there may also be swelling in areas such as the eyelids, mouth, or genitals. How is this  diagnosed? This condition is diagnosed with a medical history and physical exam.  A patch skin test may be performed to help determine the cause.  If the condition is related to your job, you may need to see an occupational medicine specialist. How is this treated? This condition is treated by checking for the cause of the reaction and protecting your skin from further contact. Treatment may also include:  Steroid creams or ointments. Oral steroid medicines may be needed in more severe cases.  Antibiotic medicines or antibacterial ointments, if a skin infection is present.  Antihistamine lotion or an antihistamine taken by mouth to ease itching.  A bandage (dressing). Follow these instructions at home: Skin care  Moisturize your skin as needed.  Apply cool compresses to the affected areas.  Try applying baking soda paste to your skin. Stir water into baking soda until it reaches a paste-like consistency.  Do not scratch your skin, and avoid friction to the affected area.  Avoid the use of soaps, perfumes, and dyes. Medicines  Take or apply over-the-counter and prescription medicines only as told by your health care provider.  If you were prescribed an antibiotic medicine, take or apply the antibiotic as told by your health care provider. Do not stop using the antibiotic even if your condition improves. Bathing  Try taking a bath with: ? Epsom salts. Follow the instructions on the packaging. You can get these at your local pharmacy or grocery store. ? Baking soda. Pour a small amount into the bath as directed by your health care provider. ? Colloidal oatmeal. Follow the instructions on the   packaging. You can get this at your local pharmacy or grocery store.  Bathe less frequently, such as every other day.  Bathe in lukewarm water. Avoid using hot water. Bandage care  If you were given a bandage (dressing), change it as told by your health care provider.  Wash your hands  with soap and water before and after you change your dressing. If soap and water are not available, use hand sanitizer. General instructions  Avoid the substance that caused your reaction. If you do not know what caused it, keep a journal to try to track what caused it. Write down: ? What you eat. ? What cosmetic products you use. ? What you drink. ? What you wear in the affected area. This includes jewelry.  More redness, swelling, or pain.  More fluid or blood.  Warmth.  Pus or a bad smell.  Keep all follow-up visits as told by your health care provider. This is important. Contact a health care provider if:  Your condition does not improve with treatment.  Your condition gets worse.  You have signs of infection such as swelling, tenderness, redness, soreness, or warmth in the affected area.  You have a fever.  You have new symptoms. Get help right away if:  You have a severe headache, neck pain, or neck stiffness.  You vomit.  You feel very sleepy.  You notice red streaks coming from the affected area.  Your bone or joint underneath the affected area becomes painful after the skin has healed.  The affected area turns darker.  You have difficulty breathing. Summary  Dermatitis is redness, soreness, and swelling (inflammation) of the skin. Contact dermatitis is a reaction to certain substances that touch the skin.  Symptoms of this condition may occur on your body anywhere the irritant has touched you or is touched by you.  This condition is treated by figuring out what caused the reaction and protecting your skin from further contact. Treatment may also include medicines and skin care.  Avoid the substance that caused your reaction. If you do not know what caused it, keep a journal to try to track what caused it.  Contact a health care provider if your condition gets worse or you have signs of infection such as swelling, tenderness, redness, soreness, or warmth  in the affected area. This information is not intended to replace advice given to you by your health care provider. Make sure you discuss any questions you have with your health care provider. Document Released: 07/14/2000 Document Revised: 11/06/2018 Document Reviewed: 01/30/2018 [employer]vier Patient Education  2020 [employer]  

## 2019-03-10 NOTE — Progress Notes (Signed)
Subjective:  Patient ID: Sarah Garrett, female    DOB: [birth year], 58 y.o.   MRN: [id number]  Patient Care Team: Baruch Gouty, FNP as PCP - General (Family Medicine) Druscilla Brownie, MD as Referring Physician (Dermatology) [name], MD as Consulting Physician (Hematology and Oncology)   Chief Complaint:  Medical Management of Chronic Issues, Hypothyroidism, Hypertension, and Hyperlipidemia   HPI: Sarah Garrett is a 58 y.o. female presenting on 03/10/2019 for Medical Management of Chronic Issues, Hypothyroidism, Hypertension, and Hyperlipidemia   1. Hypothyroidism due to medication  Compliant with medications - Yes Current medications - synthroid 125 mcg daily Adverse side effects - No Weight - stable Bowel habit changes - No Heat or cold intolerance - No Mood changes - No Changes in sleep habits - No Fatigue - No Skin, hair, or nail changes - rash to lower extremities Tremor - No Palpitations - No Edema - No Shortness of breath - No Lab Results  Component Value Date   TSH 0.653 03/05/2019    2. Pure hypercholesterolemia  Compliant with medications - Yes Current medications - Crestor 20 mg daily Side effects from medications - No Diet - generally healthy Exercise - minimal  Lab Results  Component Value Date   CHOL 139 03/05/2019   HDL 43 03/05/2019   LDLCALC 65 03/05/2019   TRIG 154 (H) 03/05/2019   CHOLHDL 3.2 03/05/2019    Family and personal medical history reviewed. Smoking and ETOH history reviewed.    3. Essential hypertension  Complaint with meds - Yes Current Medications - Lasix 40 mg, Hyzaar 100-12.5 mg Checking BP at home - No Exercising Regularly - No Watching Salt intake - Yes Pertinent ROS:  Headache - No Fatigue - No Visual Disturbances - No Chest pain - No Dyspnea - No Palpitations - No LE edema - No They report good compliance with medications and can restate their regimen by memory. No medication side effects.   Family, social, and smoking history reviewed.   BP Readings from Last 3 Encounters:  03/10/19 110/71  02/14/19 (!) 115/95  12/13/18 131/79   CMP Latest Ref Rng & Units 03/05/2019 02/14/2019 10/02/2018  Glucose 65 - 99 mg/dL 85 96 86  BUN 8 - 27 mg/dL 9 14 14   Creatinine 0.57 - 1.00 mg/dL 0.70 0.83 0.87  Sodium 134 - 144 mmol/L 144 142 145(H)  Potassium 3.5 - 5.2 mmol/L 3.4(L) 3.4(L) 3.5  Chloride 96 - 106 mmol/L 98 99 99  CO2 20 - 29 mmol/L 27 33(H) 28  Calcium 8.7 - 10.3 mg/dL 10.0 8.9 9.4  Total Protein 6.0 - 8.5 g/dL 7.1 6.8 7.0  Total Bilirubin 0.0 - 1.2 mg/dL 0.5 0.6 0.4  Alkaline Phos 39 - 117 IU/L 80 70 71  AST 0 - 40 IU/L 29 17 19   ALT 0 - 32 IU/L 26 23 22       4. Gastroesophageal reflux disease without esophagitis  Compliant with medications - Yes Current medications - Hyoscyamine, famotidine, esomeprazole Adverse side effects - No DEXA if on PPI - DEXA scan on 05/16/2018, negative for osteoporosis Cough - No Sore throat - No Voice change - No Hemoptysis - No Dysphagia or dyspepsia - No Water brash - No Red Flags (weight loss, hematochezia, melena, weight loss, early satiety, fevers, odynophagia, or persistent vomiting) - No   5. Allergic contact dermatitis, unspecified trigger  Pt has return of rash to lower extremities. Pt was initially seen 12/13/2018 for contact dermatitis due  to unknown trigger. Pt was treated with atarax, Pepcid, depo-medrol injection, and kenalog cream. Pt is on daily Zyrtec. The symptoms resolved and then returned on 02/07/2019. Pt had a virtual visit and was prescribed another course of prednisone, Pepcid, and kenalog cream. Pt reports the symptoms cleared for several days. States the ras returned again several days ago. States the rash is pruritic and located on bilateral lower legs. When asked about previous exposures, pt denied a tick bite. Pt states after the last visit, she recalled removing at tick several days prior to the initial rash. Pt  states she did not think much about this until asked during previous visit. She denied fever, chills, abdominal pain, arthralgias, or myalgias. No nausea, vomiting, or diarrhea. No throat swelling, cough, voice change, shortness of breath, or dizziness. Pt state the rash is pruritic. No new products (personal / household) or animals.      Relevant past medical, surgical, family, and social history reviewed and updated as indicated.  Allergies and medications reviewed and updated. Date reviewed: Chart in Epic.   Past Medical History:  Diagnosis Date  . Anemia   . Diverticulosis   . GERD (gastroesophageal reflux disease)   . H/O adenoidectomy   . Hemochromatosis 07/07/2011  . Hyperlipidemia   . Hypertension   . Melanoma ([place])    right shoulder  . Thyroid disease     Past Surgical History:  Procedure Laterality Date  . BREAST BIOPSY  2004   left; benign  . MELANOMA EXCISION  09/28/11   right shoulder  . TONSILLECTOMY AND ADENOIDECTOMY      Social History   Socioeconomic History  . Marital status: Married    Spouse name: Not on file  . Number of children: Not on file  . Years of education: Not on file  . Highest education level: Not on file  Occupational History  . Not on file  Social Needs  . Financial resource strain: Not on file  . Food insecurity    Worry: Not on file    Inability: Not on file  . Transportation needs    Medical: Not on file    Non-medical: Not on file  Tobacco Use  . Smoking status: Former Smoker    Packs/day: 0.25    Years: 3.00    Pack years: 0.75    Types: Cigarettes    Start date: 08/16/1974    Quit date: 01/14/1978    Years since quitting: 41.1  . Smokeless tobacco: Never Used  . Tobacco comment: quit 37 years ago  Substance and Sexual Activity  . Alcohol use: No    Alcohol/week: 0.0 standard drinks  . Drug use: No  . Sexual activity: Not on file  Lifestyle  . Physical activity    Days per week: Not on file    Minutes per session:  Not on file  . Stress: Not on file  Relationships  . Social Herbalist on phone: Not on file    Gets together: Not on file    Attends religious service: Not on file    Active member of club or organization: Not on file    Attends meetings of clubs or organizations: Not on file    Relationship status: Not on file  . Intimate partner violence    Fear of current or ex partner: Not on file    Emotionally abused: Not on file    Physically abused: Not on file    Forced sexual activity:  Not on file  Other Topics Concern  . Not on file  Social History Narrative  . Not on file    Outpatient Encounter Medications as of 03/10/2019  Medication Sig  . albuterol (PROVENTIL HFA;VENTOLIN HFA) 108 (90 Base) MCG/ACT inhaler TAKE 2 PUFFS BY MOUTH EVERY 6 HOURS AS NEEDED  . cetirizine (ZYRTEC) 10 MG tablet Take 10 mg by mouth daily.    Marland Kitchen esomeprazole (NEXIUM) 40 MG capsule Take 1 capsule (40 mg total) by mouth as needed.  . famotidine (PEPCID) 20 MG tablet Take 1 tablet (20 mg total) by mouth 2 (two) times daily for 14 days.  . Fluticasone-Salmeterol (ADVAIR DISKUS) 100-50 MCG/DOSE AEPB TAKE 1 PUFF BY MOUTH TWICE A DAY  . furosemide (LASIX) 40 MG tablet Take 1 tablet (40 mg total) by mouth daily as needed.  . hydrOXYzine (ATARAX/VISTARIL) 50 MG tablet Take 1 tablet (50 mg total) by mouth 3 (three) times daily as needed.  . hyoscyamine (LEVSIN SL) 0.125 MG SL tablet Place 1 tablet (0.125 mg total) under the tongue as needed. Place 1-2 tablet under the tongue until it dissolves as needed  . Icosapent Ethyl (VASCEPA) 1 g CAPS Take 1 capsule (1 g total) by mouth 2 (two) times daily.  Marland Kitchen levothyroxine (SYNTHROID, LEVOTHROID) 125 MCG tablet Take 1 tablet (125 mcg total) by mouth daily.  Marland Kitchen losartan-hydrochlorothiazide (HYZAAR) 100-12.5 MG tablet Take 1 tablet by mouth daily. as directed  . meclizine (ANTIVERT) 25 MG tablet Take 1 tablet (25 mg total) by mouth 3 (three) times daily as needed for  dizziness.  . montelukast (SINGULAIR) 10 MG tablet TAKE 1 TABLET BY MOUTH EVERYDAY AT BEDTIME  . potassium chloride (K-DUR) 10 MEQ tablet Take 1 tablet (10 mEq total) by mouth daily.  . rosuvastatin (CRESTOR) 20 MG tablet TAKE 1 TABLET (20 MG TOTAL) BY MOUTH DAILY.  Marland Kitchen triamcinolone cream (KENALOG) 0.1 % Apply 1 application topically 2 (two) times daily.  . Vitamin D, Ergocalciferol, (DRISDOL) 1.25 MG (50000 UT) CAPS capsule Take 1 capsule (50,000 Units total) by mouth every 7 (seven) days.  . [DISCONTINUED] famotidine (PEPCID) 20 MG tablet TAKE 1 TABLET BY MOUTH TWICE A DAY  . predniSONE (STERAPRED UNI-PAK 21 TAB) 10 MG (21) TBPK tablet As directed x 6 days   No facility-administered encounter medications on file as of 03/10/2019.     Allergies  Allergen Reactions  . Aspirin Swelling    Swelling of mouth and throat  . Ace Inhibitors Other (See Comments)    Per [id number]  . Actos [Pioglitazone Hydrochloride] Other (See Comments)    Per pt: unknown  . Ciprofloxacin Other (See Comments)    Unknown reaction  . Crestor [Rosuvastatin Calcium] Other (See Comments)    Per pt: unknown  . Pneumococcal Vaccines Rash  . Sulfonamide Derivatives Other (See Comments)    Per pt: unknown    Review of Systems  Constitutional: Negative for activity change, appetite change, chills, diaphoresis, fatigue, fever and unexpected weight change.  HENT: Negative.  Negative for sore throat, trouble swallowing and voice change.   Eyes: Negative.  Negative for photophobia and visual disturbance.  Respiratory: Negative for apnea, cough, choking, chest tightness, shortness of breath, wheezing and stridor.   Cardiovascular: Negative for chest pain, palpitations and leg swelling.  Gastrointestinal: Negative for abdominal distention, abdominal pain, blood in stool, constipation, diarrhea, nausea and vomiting.  Endocrine: Negative.  Negative for cold intolerance, heat intolerance, polydipsia, polyphagia and polyuria.   Genitourinary: Negative for decreased urine volume,  difficulty urinating, dysuria, frequency and urgency.  Musculoskeletal: Negative for arthralgias, back pain, gait problem, joint swelling, myalgias, neck pain and neck stiffness.  Skin: Positive for rash.  Allergic/Immunologic: Negative.   Neurological: Negative for dizziness, tremors, seizures, syncope, facial asymmetry, speech difficulty, weakness, light-headedness, numbness and headaches.  Hematological: Negative.  Does not bruise/bleed easily.  Psychiatric/Behavioral: Negative for confusion, hallucinations, sleep disturbance and suicidal ideas.  All other systems reviewed and are negative.       Objective:  BP 110/71   Pulse 80   Temp 97.7 F (36.5 C)   Ht 5' 3"  (1.6 m)   Wt 192 lb (87.1 kg)   LMP 11/29/2011   BMI 34.01 kg/m    Wt Readings from Last 3 Encounters:  03/10/19 192 lb (87.1 kg)  02/14/19 195 lb (88.5 kg)  12/13/18 200 lb (90.7 kg)    Physical Exam Vitals signs and nursing note reviewed.  Constitutional:      General: She is not in acute distress.    Appearance: Normal appearance. She is well-developed and well-groomed. She is not ill-appearing, toxic-appearing or diaphoretic.  HENT:     Head: Normocephalic and atraumatic.     Jaw: There is normal jaw occlusion.     Right Ear: Hearing normal.     Left Ear: Hearing normal.     Nose: Nose normal.     Mouth/Throat:     Lips: Pink.     Mouth: Mucous membranes are moist.     Pharynx: Oropharynx is clear. Uvula midline.  Eyes:     General: Lids are normal.     Extraocular Movements: Extraocular movements intact.     Conjunctiva/sclera: Conjunctivae normal.     Pupils: Pupils are equal, round, and reactive to light.  Neck:     Musculoskeletal: Normal range of motion and neck supple.     Thyroid: No thyroid mass, thyromegaly or thyroid tenderness.     Vascular: No carotid bruit or JVD.     Trachea: Trachea and phonation normal.  Cardiovascular:      Rate and Rhythm: Normal rate and regular rhythm.     Chest Wall: PMI is not displaced.     Pulses: Normal pulses.     Heart sounds: Normal heart sounds. No murmur. No friction rub. No gallop.   Pulmonary:     Effort: Pulmonary effort is normal. No respiratory distress.     Breath sounds: Normal breath sounds. No wheezing.  Abdominal:     General: Bowel sounds are normal. There is no distension or abdominal bruit.     Palpations: Abdomen is soft. There is no hepatomegaly or splenomegaly.     Tenderness: There is no abdominal tenderness. There is no right CVA tenderness or left CVA tenderness.     Hernia: No hernia is present.  Musculoskeletal: Normal range of motion.     Right lower leg: No edema.     Left lower leg: No edema.  Lymphadenopathy:     Cervical: No cervical adenopathy.  Skin:    General: Skin is warm and dry.     Capillary Refill: Capillary refill takes less than 2 seconds.     Coloration: Skin is not cyanotic, jaundiced or pale.     Findings: Erythema and rash present. Rash is macular and papular.       Neurological:     General: No focal deficit present.     Mental Status: She is alert and oriented to person, place, and time.  Cranial Nerves: Cranial nerves are intact.     Sensory: Sensation is intact.     Motor: Motor function is intact.     Coordination: Coordination is intact.     Gait: Gait is intact.     Deep Tendon Reflexes: Reflexes are normal and symmetric.  Psychiatric:        Attention and Perception: Attention and perception normal.        Mood and Affect: Mood and affect normal.        Speech: Speech normal.        Behavior: Behavior normal. Behavior is cooperative.        Thought Content: Thought content normal.        Cognition and Memory: Cognition and memory normal.        Judgment: Judgment normal.     Results for orders placed or performed in visit on 03/05/19  CBC with Differential/Platelet  Result Value Ref Range   WBC 7.5 3.4 - 10.8  x10E3/uL   RBC 5.06 3.77 - 5.28 x10E6/uL   Hemoglobin 15.6 11.1 - 15.9 g/dL   Hematocrit 44.8 34.0 - 46.6 %   MCV 89 79 - 97 fL   MCH 30.8 26.6 - 33.0 pg   MCHC 34.8 31.5 - 35.7 g/dL   RDW 13.9 11.7 - 15.4 %   Platelets 204 150 - 450 x10E3/uL   Neutrophils 58 Not Estab. %   Lymphs 32 Not Estab. %   Monocytes 5 Not Estab. %   Eos 4 Not Estab. %   Basos 1 Not Estab. %   Neutrophils Absolute 4.4 1.4 - 7.0 x10E3/uL   Lymphocytes Absolute 2.4 0.7 - 3.1 x10E3/uL   Monocytes Absolute 0.4 0.1 - 0.9 x10E3/uL   EOS (ABSOLUTE) 0.3 0.0 - 0.4 x10E3/uL   Basophils Absolute 0.1 0.0 - 0.2 x10E3/uL   Immature Granulocytes 0 Not Estab. %   Immature Grans (Abs) 0.0 0.0 - 0.1 x10E3/uL  CMP14+EGFR  Result Value Ref Range   Glucose 85 65 - 99 mg/dL   BUN 9 8 - 27 mg/dL   Creatinine, Ser 0.70 0.57 - 1.00 mg/dL   GFR calc non Af Amer 94 >59 mL/min/1.73   GFR calc Af Amer 109 >59 mL/min/1.73   BUN/Creatinine Ratio 13 12 - 28   Sodium 144 134 - 144 mmol/L   Potassium 3.4 (L) 3.5 - 5.2 mmol/L   Chloride 98 96 - 106 mmol/L   CO2 27 20 - 29 mmol/L   Calcium 10.0 8.7 - 10.3 mg/dL   Total Protein 7.1 6.0 - 8.5 g/dL   Albumin 4.9 3.8 - 4.9 g/dL   Globulin, Total 2.2 1.5 - 4.5 g/dL   Albumin/Globulin Ratio 2.2 1.2 - 2.2   Bilirubin Total 0.5 0.0 - 1.2 mg/dL   Alkaline Phosphatase 80 39 - 117 IU/L   AST 29 0 - 40 IU/L   ALT 26 0 - 32 IU/L  Lipid panel  Result Value Ref Range   Cholesterol, Total 139 100 - 199 mg/dL   Triglycerides 154 (H) 0 - 149 mg/dL   HDL 43 >39 mg/dL   VLDL Cholesterol Cal 31 5 - 40 mg/dL   LDL Calculated 65 0 - 99 mg/dL   Chol/HDL Ratio 3.2 0.0 - 4.4 ratio  Thyroid Panel With TSH  Result Value Ref Range   TSH 0.653 0.450 - 4.500 uIU/mL   T4, Total 11.7 4.5 - 12.0 ug/dL   T3 Uptake Ratio 28 24 - 39 %  Free Thyroxine Index 3.3 1.2 - 4.9       Pertinent labs & imaging results that were available during my care of the patient were reviewed by me and considered in my  medical decision making.  Assessment & Plan:  Triston was seen today for medical management of chronic issues, hypothyroidism, hypertension and hyperlipidemia.  Diagnoses and all orders for this visit:  Hypothyroidism due to medication Thyroid disease has been well controlled. Labs are pending. Adjustments to regimen will be made if warranted. Make sure to take medications on an empty stomach with a full glass of water. Make sure to avoid vitamins or supplements for at least 4 hours before and 4 hours after taking medications. Repeat labs in 3 months if adjustments are made and in 6 months if stable.    Pure hypercholesterolemia Diet encouraged - increase intake of fresh fruits and vegetables, increase intake of lean proteins. Bake, broil, or grill foods. Avoid fried, greasy, and fatty foods. Avoid fast foods. Increase intake of fiber-rich whole grains.  Exercise encouraged - at least 150 minutes per week and advance as tolerated.  Goal BMI < 25.  Medications as prescribed. Labs pending, will change therapy if warranted. Follow up in 3-6 months as discussed.   Essential hypertension BP well controlled. Changes were not made in regimen today. Daily blood pressure log given with instructions on how to fill out and told to bring to next visit. Gaol BP 130/80. Pt aware to report any persistent high or low readings. DASH diet and exercise encouraged. Exercise at least 150 minutes per week and increase as tolerated. Goal BMI > 25. Stress management encouraged. Smoking cessation discussed. Avoid excessive alcohol. Avoid NSAID's. Avoid more than 2000 mg of sodium daily. Medications as prescribed. Follow up as scheduled.   Gastroesophageal reflux disease without esophagitis Diet discussed. Avoid fried, spicy, fatty, and acidic foods. Avoid caffeine, nicotine, and alcohol. Do not eat 2-3 hours before bedtime and stay upright for at least 1-2 hours after eating. Eat small frequent meals. Avoid NSAID's like  motrin and aleve. Medications as prescribed. Report any new or worsening symptoms. Follow up as discussed or sooner if needed.    Allergic contact dermatitis, unspecified trigger No signs of anaphylaxis. Symptoms concerning for Alpha-Gal due to recurrence of symptoms and no change in external products. No new exposures. Did have a tick bite to left lower abdomen prior to initial occurrence of rash. Will treat with below and order Alpha-Gal panel. If Alpha-Gal is negative, will refer to [facility] dermatology for evaluation and treatment. Pt aware to report any new or worsening symptoms. Pt aware to keep a record of symptoms and what she has had to eat or drink around time of rash and if she has used any new products.  -     Alpha-Gal Panel -     predniSONE (STERAPRED UNI-PAK 21 TAB) 10 MG (21) TBPK tablet; As directed x 6 days -     famotidine (PEPCID) 20 MG tablet; Take 1 tablet (20 mg total) by mouth 2 (two) times daily for 14 days.  Tick bite, initial encounter Tick bite to left lower abdomen several months ago. Has had recurrent dermatitis of unknown origin. Will test for Alpha-Gal today. -     Alpha-Gal Panel     Continue all other maintenance medications.  Follow up plan: Return in about 6 months (around 09/10/2019), or if symptoms worsen or fail to improve, for HTN, Thyroid, Lipid.  Continue healthy lifestyle choices, including  diet (rich in fruits, vegetables, and lean proteins, and low in salt and simple carbohydrates) and exercise (at least 30 minutes of moderate physical activity daily).  Educational handout given for contact dermatitis   The above assessment and management plan was discussed with the patient. The patient verbalized understanding of and has agreed to the management plan. Patient is aware to call the clinic if symptoms persist or worsen. Patient is aware when to return to the clinic for a follow-up visit. Patient educated on when it is appropriate to go to the emergency  department.   Monia Pouch, FNP-C [facility] [phone] 03/10/19

## 2019-03-14 DIAGNOSIS — Z91018 Allergy to other foods: Secondary | ICD-10-CM | POA: Insufficient documentation

## 2019-03-14 LAB — ALPHA-GAL PANEL
Alpha Gal IgE*: <0.1 kU/L (ref <0.10)
Beef (Bos spp) IgE: 1.78 kU/L — ABNORMAL HIGH (ref <0.35)
Class Interpretation: 2
Class Interpretation: 3
Class Interpretation: 3
Lamb/Mutton (Ovis spp) IgE: 3.69 kU/L — ABNORMAL HIGH (ref <0.35)
Pork (Sus spp) IgE: 8.55 kU/L — ABNORMAL HIGH (ref <0.35)

## 2019-03-14 MED ORDER — EPINEPHRINE 0.3 MG/0.3ML IJ SOAJ: 0.3000 mg | INTRAMUSCULAR | 3 refills | Status: DC | PRN

## 2019-03-14 NOTE — Addendum Note (Signed)
Addended by: Baruch Gouty on: 03/14/2019 06:37 PM   Modules accepted: Orders

## 2019-04-04 ENCOUNTER — Other Ambulatory Visit: Payer: Self-pay | Admitting: Family Medicine

## 2019-04-04 DIAGNOSIS — L239 Allergic contact dermatitis, unspecified cause: Secondary | ICD-10-CM

## 2019-04-11 ENCOUNTER — Telehealth: Payer: Self-pay | Admitting: Family Medicine

## 2019-04-11 NOTE — Telephone Encounter (Signed)
I have spoken to patient. She is aware she has been eating foods that she shouldn't be due to her alpha-gal. She is going to really watch what she is eating. Advised to use famotidine and Benadryl for reaction. She is also going to try switching the Zyrtec to Xyzal. Advised to let us know if this doesn't get any better.

## 2019-04-11 NOTE — Telephone Encounter (Signed)
Pt has alpha gal, she has been treated for rash by [facility]. She is trying to avoid meats and is new to this. Her legs are broke out in whelps again, pls advise

## 2019-04-14 ENCOUNTER — Other Ambulatory Visit: Payer: Self-pay | Admitting: Family Medicine

## 2019-04-14 DIAGNOSIS — L239 Allergic contact dermatitis, unspecified cause: Secondary | ICD-10-CM

## 2019-05-09 ENCOUNTER — Telehealth: Payer: Self-pay | Admitting: Family Medicine

## 2019-05-09 DIAGNOSIS — L239 Allergic contact dermatitis, unspecified cause: Secondary | ICD-10-CM

## 2019-05-09 MED ORDER — TRIAMCINOLONE ACETONIDE 0.1 % EX CREA: 1.0000 "application " | TOPICAL_CREAM | Freq: Two times a day (BID) | CUTANEOUS | 2 refills | Status: DC

## 2019-05-09 NOTE — Telephone Encounter (Signed)
Pt aware - cream refill sent in

## 2019-05-29 ENCOUNTER — Telehealth: Payer: Self-pay | Admitting: Family Medicine

## 2019-05-29 ENCOUNTER — Ambulatory Visit (INDEPENDENT_AMBULATORY_CARE_PROVIDER_SITE_OTHER): Payer: BC Managed Care – PPO | Admitting: Family Medicine

## 2019-05-29 ENCOUNTER — Encounter: Payer: Self-pay | Admitting: Family Medicine

## 2019-05-29 DIAGNOSIS — R21 Rash and other nonspecific skin eruption: Secondary | ICD-10-CM | POA: Diagnosis not present

## 2019-05-29 DIAGNOSIS — L239 Allergic contact dermatitis, unspecified cause: Secondary | ICD-10-CM

## 2019-05-29 MED ORDER — HYDROXYZINE HCL 50 MG PO TABS: 50.0000 mg | ORAL_TABLET | Freq: Three times a day (TID) | ORAL | 0 refills | Status: DC | PRN

## 2019-05-29 MED ORDER — PREDNISONE 10 MG (21) PO TBPK: ORAL_TABLET | ORAL | 0 refills | Status: DC

## 2019-05-29 NOTE — Progress Notes (Signed)
Virtual Visit via telephone Note Due to COVID-19 pandemic this visit was conducted virtually. This visit type was conducted due to national recommendations for restrictions regarding the COVID-19 Pandemic (e.g. social distancing, sheltering in place) in an effort to limit this patient's exposure and mitigate transmission in our community. All issues noted in this document were discussed and addressed.  A physical exam was not performed with this format.   I connected with Sarah Garrett on 05/29/2019 at 1115 by telephone and verified that I am speaking with the correct person using two identifiers. Sarah Garrett is currently located at home and family is currently with them during visit. The provider, Monia Pouch, FNP is located in their office at time of visit.  I discussed the limitations, risks, security and privacy concerns of performing an evaluation and management service by telephone and the availability of in person appointments. I also discussed with the patient that there may be a patient responsible charge related to this service. The patient expressed understanding and agreed to proceed.  Subjective:  Patient ID: Sarah Garrett, female    DOB: [birth year], 58 y.o.   MRN: [id number]  Chief Complaint:  Rash   HPI: Sarah Garrett is a 58 y.o. female presenting on 05/29/2019 for Rash   Pt reports ongoing pruritic red rash to bilateral lower extremities despite diet changes after positive Alpha-Gal testing. Pt states she has eliminated all mammal based products. States the rash got a little better but has never completely subsided. States the rash has become worse over the last few days. States her legs are very irritated. States it is hard to shave due to the rash. She denies changes in products at home. No new medications or animals.   Rash This is a recurrent problem. The current episode started more than 1 month ago. The problem has been waxing and waning since onset.  The affected locations include the left lower leg, left ankle, right ankle, right lower leg, right upper leg and left upper leg. The rash is characterized by itchiness, redness, swelling and pain. It is unknown if there was an exposure to a precipitant. Pertinent negatives include no anorexia, congestion, cough, diarrhea, eye pain, facial edema, fatigue, fever, joint pain, nail changes, rhinorrhea, shortness of breath, sore throat or vomiting. Past treatments include antihistamine, anti-itch cream, oral steroids and topical steroids. The treatment provided mild relief. Her past medical history is significant for allergies.     Relevant past medical, surgical, family, and social history reviewed and updated as indicated.  Allergies and medications reviewed and updated.   Past Medical History:  Diagnosis Date  . Anemia   . Diverticulosis   . GERD (gastroesophageal reflux disease)   . H/O adenoidectomy   . Hemochromatosis 07/07/2011  . Hyperlipidemia   . Hypertension   . Melanoma ([place])    right shoulder  . Thyroid disease     Past Surgical History:  Procedure Laterality Date  . BREAST BIOPSY  2004   left; benign  . MELANOMA EXCISION  09/28/11   right shoulder  . TONSILLECTOMY AND ADENOIDECTOMY      Social History   Socioeconomic History  . Marital status: Married    Spouse name: Not on file  . Number of children: Not on file  . Years of education: Not on file  . Highest education level: Not on file  Occupational History  . Not on file  Social Needs  . Financial resource strain: Not on file  .  Food insecurity    Worry: Not on file    Inability: Not on file  . Transportation needs    Medical: Not on file    Non-medical: Not on file  Tobacco Use  . Smoking status: Former Smoker    Packs/day: 0.25    Years: 3.00    Pack years: 0.75    Types: Cigarettes    Start date: 08/16/1974    Quit date: 01/14/1978    Years since quitting: 41.3  . Smokeless tobacco: Never Used  .  Tobacco comment: quit 37 years ago  Substance and Sexual Activity  . Alcohol use: No    Alcohol/week: 0.0 standard drinks  . Drug use: No  . Sexual activity: Not on file  Lifestyle  . Physical activity    Days per week: Not on file    Minutes per session: Not on file  . Stress: Not on file  Relationships  . Social Herbalist on phone: Not on file    Gets together: Not on file    Attends religious service: Not on file    Active member of club or organization: Not on file    Attends meetings of clubs or organizations: Not on file    Relationship status: Not on file  . Intimate partner violence    Fear of current or ex partner: Not on file    Emotionally abused: Not on file    Physically abused: Not on file    Forced sexual activity: Not on file  Other Topics Concern  . Not on file  Social History Narrative  . Not on file    Outpatient Encounter Medications as of 05/29/2019  Medication Sig  . albuterol (PROVENTIL HFA;VENTOLIN HFA) 108 (90 Base) MCG/ACT inhaler TAKE 2 PUFFS BY MOUTH EVERY 6 HOURS AS NEEDED  . cetirizine (ZYRTEC) 10 MG tablet Take 10 mg by mouth daily.    Marland Kitchen EPINEPHrine 0.3 mg/0.3 mL IJ SOAJ injection Inject 0.3 mLs (0.3 mg total) into the muscle as needed for anaphylaxis.  Marland Kitchen esomeprazole (NEXIUM) 40 MG capsule Take 1 capsule (40 mg total) by mouth as needed.  . famotidine (PEPCID) 20 MG tablet TAKE 1 TABLET BY MOUTH TWICE A DAY  . Fluticasone-Salmeterol (ADVAIR DISKUS) 100-50 MCG/DOSE AEPB TAKE 1 PUFF BY MOUTH TWICE A DAY  . furosemide (LASIX) 40 MG tablet Take 1 tablet (40 mg total) by mouth daily as needed.  . hydrOXYzine (ATARAX/VISTARIL) 50 MG tablet Take 1 tablet (50 mg total) by mouth 3 (three) times daily as needed.  . hyoscyamine (LEVSIN SL) 0.125 MG SL tablet Place 1 tablet (0.125 mg total) under the tongue as needed. Place 1-2 tablet under the tongue until it dissolves as needed  . Icosapent Ethyl (VASCEPA) 1 g CAPS Take 1 capsule (1 g total)  by mouth 2 (two) times daily.  Marland Kitchen levothyroxine (SYNTHROID, LEVOTHROID) 125 MCG tablet Take 1 tablet (125 mcg total) by mouth daily.  Marland Kitchen losartan-hydrochlorothiazide (HYZAAR) 100-12.5 MG tablet Take 1 tablet by mouth daily. as directed  . meclizine (ANTIVERT) 25 MG tablet Take 1 tablet (25 mg total) by mouth 3 (three) times daily as needed for dizziness.  . montelukast (SINGULAIR) 10 MG tablet TAKE 1 TABLET BY MOUTH EVERYDAY AT BEDTIME  . potassium chloride (K-DUR) 10 MEQ tablet Take 1 tablet (10 mEq total) by mouth daily.  . predniSONE (STERAPRED UNI-PAK 21 TAB) 10 MG (21) TBPK tablet As directed x 6 days  . rosuvastatin (CRESTOR) 20 MG  tablet TAKE 1 TABLET (20 MG TOTAL) BY MOUTH DAILY.  Marland Kitchen Vitamin D, Ergocalciferol, (DRISDOL) 1.25 MG (50000 UT) CAPS capsule Take 1 capsule (50,000 Units total) by mouth every 7 (seven) days.  . [DISCONTINUED] hydrOXYzine (ATARAX/VISTARIL) 50 MG tablet Take 1 tablet (50 mg total) by mouth 3 (three) times daily as needed.  . [DISCONTINUED] predniSONE (STERAPRED UNI-PAK 21 TAB) 10 MG (21) TBPK tablet As directed x 6 days  . [DISCONTINUED] triamcinolone cream (KENALOG) 0.1 % Apply 1 application topically 2 (two) times daily.   No facility-administered encounter medications on file as of 05/29/2019.     Allergies  Allergen Reactions  . Aspirin Swelling    Swelling of mouth and throat  . Alpha-Gal   . Ace Inhibitors Other (See Comments)    Per [id number]  . Actos [Pioglitazone Hydrochloride] Other (See Comments)    Per pt: unknown  . Ciprofloxacin Other (See Comments)    Unknown reaction  . Crestor [Rosuvastatin Calcium] Other (See Comments)    Per pt: unknown  . Pneumococcal Vaccines Rash  . Sulfonamide Derivatives Other (See Comments)    Per pt: unknown    Review of Systems  Constitutional: Negative for activity change, appetite change, chills, diaphoresis, fatigue, fever and unexpected weight change.  HENT: Negative.  Negative for congestion,  rhinorrhea, sore throat, trouble swallowing and voice change.   Eyes: Negative.  Negative for photophobia, pain and visual disturbance.  Respiratory: Negative for apnea, cough, choking, chest tightness, shortness of breath, wheezing and stridor.   Cardiovascular: Negative for chest pain, palpitations and leg swelling.  Gastrointestinal: Negative for abdominal pain, anorexia, blood in stool, constipation, diarrhea, nausea and vomiting.  Endocrine: Negative.   Genitourinary: Negative for decreased urine volume, difficulty urinating, dysuria, frequency and urgency.  Musculoskeletal: Negative for arthralgias, joint pain and myalgias.  Skin: Positive for rash. Negative for color change, nail changes, pallor and wound.  Allergic/Immunologic: Negative.   Neurological: Negative for dizziness, tremors, seizures, syncope, facial asymmetry, speech difficulty, weakness, light-headedness, numbness and headaches.  Hematological: Negative.   Psychiatric/Behavioral: Negative for confusion, hallucinations, sleep disturbance and suicidal ideas.  All other systems reviewed and are negative.        Observations/Objective: No vital signs or physical exam, this was a telephone or virtual health encounter.  Pt alert and oriented, answers all questions appropriately, and able to speak in full sentences.    Assessment and Plan: Mahliyah was seen today for rash.  Diagnoses and all orders for this visit:  Rash of unknown cause Allergic contact dermatitis, unspecified trigger Ongoing rash despite elimination of mammal products after positive alpha-gal testing. Has been taking antihistamines and using topical steroids without relief of symptoms. Due to ongoing nature of symptoms despite therapy, will refer to dermatology and allergist for evaluation. Will treat with burst of steroids and atarax as needed for pruritis.  -     predniSONE (STERAPRED UNI-PAK 21 TAB) 10 MG (21) TBPK tablet; As directed x 6 days -      hydrOXYzine (ATARAX/VISTARIL) 50 MG tablet; Take 1 tablet (50 mg total) by mouth 3 (three) times daily as needed. -     Ambulatory referral to Allergy -     Ambulatory referral to Dermatology     Follow Up Instructions: Return if symptoms worsen or fail to improve.    I discussed the assessment and treatment plan with the patient. The patient was provided an opportunity to ask questions and all were answered. The patient agreed with the plan and  demonstrated an understanding of the instructions.   The patient was advised to call back or seek an in-person evaluation if the symptoms worsen or if the condition fails to improve as anticipated.  The above assessment and management plan was discussed with the patient. The patient verbalized understanding of and has agreed to the management plan. Patient is aware to call the clinic if they develop any new symptoms or if symptoms persist or worsen. Patient is aware when to return to the clinic for a follow-up visit. Patient educated on when it is appropriate to go to the emergency department.    I provided 15 minutes of non-face-to-face time during this encounter. The call started at 1115. The call ended at 1130. The other time was used for coordination of care.    Monia Pouch, FNP-C [facility] [address] [place], [place] [address] [phone] 05/29/2019

## 2019-05-29 NOTE — Telephone Encounter (Signed)
Pt is still having trouble with her legs - tele appt made

## 2019-05-30 ENCOUNTER — Ambulatory Visit: Payer: BC Managed Care – PPO | Admitting: Family Medicine

## 2019-06-05 DIAGNOSIS — L309 Dermatitis, unspecified: Secondary | ICD-10-CM | POA: Diagnosis not present

## 2019-06-19 DIAGNOSIS — L920 Granuloma annulare: Secondary | ICD-10-CM | POA: Diagnosis not present

## 2019-06-19 DIAGNOSIS — R21 Rash and other nonspecific skin eruption: Secondary | ICD-10-CM | POA: Diagnosis not present

## 2019-06-23 ENCOUNTER — Ambulatory Visit: Payer: BC Managed Care – PPO | Admitting: Allergy

## 2019-08-05 ENCOUNTER — Telehealth: Payer: Self-pay | Admitting: Family Medicine

## 2019-08-05 NOTE — Telephone Encounter (Signed)
Spoke with pt and she states she just had a rapid COVID test and it was positive so pt was asking when she should go back to work. Advised pt she should quarantine 10 days from positive test result and coordinate with the [facility] when they call her and she voiced understanding.

## 2019-08-18 ENCOUNTER — Inpatient Hospital Stay: Payer: BC Managed Care – PPO | Admitting: Family

## 2019-08-18 ENCOUNTER — Inpatient Hospital Stay: Payer: BC Managed Care – PPO

## 2019-08-19 ENCOUNTER — Telehealth: Payer: Self-pay | Admitting: Family Medicine

## 2019-08-19 NOTE — Telephone Encounter (Signed)
Pt having some knee issues = would like appt / appt made

## 2019-08-20 ENCOUNTER — Encounter: Payer: Self-pay | Admitting: Family Medicine

## 2019-08-20 ENCOUNTER — Other Ambulatory Visit: Payer: Self-pay

## 2019-08-20 ENCOUNTER — Ambulatory Visit (INDEPENDENT_AMBULATORY_CARE_PROVIDER_SITE_OTHER): Payer: BC Managed Care – PPO

## 2019-08-20 ENCOUNTER — Ambulatory Visit: Payer: BC Managed Care – PPO | Admitting: Family Medicine

## 2019-08-20 VITALS — BP 102/70 | HR 89 | Temp 98.6°F | Resp 20 | Ht 63.0 in | Wt 177.0 lb

## 2019-08-20 DIAGNOSIS — M25561 Pain in right knee: Secondary | ICD-10-CM

## 2019-08-20 DIAGNOSIS — M25461 Effusion, right knee: Secondary | ICD-10-CM | POA: Diagnosis not present

## 2019-08-20 MED ORDER — METHYLPREDNISOLONE ACETATE 80 MG/ML IJ SUSP
40.0000 mg | Freq: Once | INTRAMUSCULAR | Status: AC
  Administered 2019-08-20: 40 mg via INTRAMUSCULAR

## 2019-08-20 MED ORDER — NAPROXEN 500 MG PO TABS: 500.0000 mg | ORAL_TABLET | Freq: Two times a day (BID) | ORAL | 0 refills | Status: DC

## 2019-08-20 NOTE — Patient Instructions (Signed)

## 2019-08-20 NOTE — Progress Notes (Signed)
Sarah Garrett is a 59 y.o. female   HPI:  Knee Pain: Patient presents with knee pain involving the  right knee. Onset of the symptoms was several weeks ago. Inciting event: none known. Current symptoms include pain located entire knee, stiffness and swelling. Pain is aggravated by any weight bearing, going up and down stairs, kneeling, rising after sitting, squatting and walking.  Patient has had no prior knee problems. Evaluation to date: none. Treatment to date: none.  Past Medical History:  Diagnosis Date  . Anemia   . Diverticulosis   . GERD (gastroesophageal reflux disease)   . H/O adenoidectomy   . Hemochromatosis 07/07/2011  . Hyperlipidemia   . Hypertension   . Melanoma ([place])    right shoulder  . Thyroid disease    Past Surgical History:  Procedure Laterality Date  . BREAST BIOPSY  2004   left; benign  . MELANOMA EXCISION  09/28/11   right shoulder  . TONSILLECTOMY AND ADENOIDECTOMY      Current Outpatient Medications:  .  albuterol (PROVENTIL HFA;VENTOLIN HFA) 108 (90 Base) MCG/ACT inhaler, TAKE 2 PUFFS BY MOUTH EVERY 6 HOURS AS NEEDED, Disp: 18 Inhaler, Rfl: 11 .  cetirizine (ZYRTEC) 10 MG tablet, Take 10 mg by mouth daily.  , Disp: , Rfl:  .  EPINEPHrine 0.3 mg/0.3 mL IJ SOAJ injection, Inject 0.3 mLs (0.3 mg total) into the muscle as needed for anaphylaxis., Disp: 1 each, Rfl: 3 .  esomeprazole (NEXIUM) 40 MG capsule, Take 1 capsule (40 mg total) by mouth as needed., Disp: 30 capsule, Rfl: 6 .  famotidine (PEPCID) 20 MG tablet, TAKE 1 TABLET BY MOUTH TWICE A DAY, Disp: 180 tablet, Rfl: 1 .  Fluticasone-Salmeterol (ADVAIR DISKUS) 100-50 MCG/DOSE AEPB, TAKE 1 PUFF BY MOUTH TWICE A DAY, Disp: 180 each, Rfl: 3 .  furosemide (LASIX) 40 MG tablet, Take 1 tablet (40 mg total) by mouth daily as needed., Disp: 90 tablet, Rfl: 3 .  hydrOXYzine (ATARAX/VISTARIL) 50 MG tablet, Take 1 tablet (50 mg total) by mouth 3 (three) times daily as needed., Disp: 30 tablet, Rfl: 0 .   hyoscyamine (LEVSIN SL) 0.125 MG SL tablet, Place 1 tablet (0.125 mg total) under the tongue as needed. Place 1-2 tablet under the tongue until it dissolves as needed, Disp: 30 tablet, Rfl: 2 .  Icosapent Ethyl (VASCEPA) 1 g CAPS, Take 1 capsule (1 g total) by mouth 2 (two) times daily., Disp: 180 capsule, Rfl: 3 .  levothyroxine (SYNTHROID, LEVOTHROID) 125 MCG tablet, Take 1 tablet (125 mcg total) by mouth daily., Disp: 90 tablet, Rfl: 3 .  losartan-hydrochlorothiazide (HYZAAR) 100-12.5 MG tablet, Take 1 tablet by mouth daily. as directed, Disp: 90 tablet, Rfl: 3 .  meclizine (ANTIVERT) 25 MG tablet, Take 1 tablet (25 mg total) by mouth 3 (three) times daily as needed for dizziness., Disp: 30 tablet, Rfl: 0 .  montelukast (SINGULAIR) 10 MG tablet, TAKE 1 TABLET BY MOUTH EVERYDAY AT BEDTIME, Disp: 90 tablet, Rfl: 3 .  naproxen (NAPROSYN) 500 MG tablet, Take 1 tablet (500 mg total) by mouth 2 (two) times daily with a meal., Disp: 120 tablet, Rfl: 0 .  potassium chloride (K-DUR) 10 MEQ tablet, Take 1 tablet (10 mEq total) by mouth daily., Disp: 90 tablet, Rfl: 3 .  predniSONE (STERAPRED UNI-PAK 21 TAB) 10 MG (21) TBPK tablet, As directed x 6 days, Disp: 21 tablet, Rfl: 0 .  rosuvastatin (CRESTOR) 20 MG tablet, TAKE 1 TABLET (20 MG TOTAL) BY  MOUTH DAILY., Disp: 90 tablet, Rfl: 3 .  Vitamin D, Ergocalciferol, (DRISDOL) 1.25 MG (50000 UT) CAPS capsule, Take 1 capsule (50,000 Units total) by mouth every 7 (seven) days., Disp: 12 capsule, Rfl: 3 Allergies  Allergen Reactions  . Aspirin Swelling    Swelling of mouth and throat  . Alpha-Gal   . Ace Inhibitors Other (See Comments)    Per [id number]  . Actos [Pioglitazone Hydrochloride] Other (See Comments)    Per pt: unknown  . Ciprofloxacin Other (See Comments)    Unknown reaction  . Crestor [Rosuvastatin Calcium] Other (See Comments)    Per pt: unknown  . Pneumococcal Vaccines Rash  . Sulfonamide Derivatives Other (See Comments)    Per pt: unknown      reports that she quit smoking about 41 years ago. Her smoking use included cigarettes. She started smoking about 45 years ago. She has a 0.75 pack-year smoking history. She has never used smokeless tobacco. She reports that she does not drink alcohol or use drugs. Family History  Problem Relation Age of Onset  . Heart disease Mother        by pass  . Colon cancer Neg Hx   . Stomach cancer Neg Hx    Review of Systems  Constitutional: Negative for chills, diaphoresis, fever, malaise/fatigue and weight loss.  Respiratory: Negative for cough.   Cardiovascular: Negative for chest pain, palpitations, claudication, leg swelling and PND.  Gastrointestinal: Negative for abdominal pain.  Genitourinary: Negative for dysuria.  Musculoskeletal: Positive for joint pain and myalgias.  Neurological: Negative for tingling, focal weakness and weakness.  All other systems reviewed and are negative.   Physical Exam  Constitutional: She is oriented to person, place, and time and well-developed, well-nourished, and in no distress. No distress.  HENT:  Head: Normocephalic and atraumatic.  Eyes: Pupils are equal, round, and reactive to light.  Cardiovascular: Normal rate, regular rhythm and intact distal pulses. Exam reveals no gallop.  No murmur heard. Pulmonary/Chest: Effort normal and breath sounds normal.  Musculoskeletal:     Cervical back: Normal range of motion and neck supple.     Right hip: Normal.     Right knee: Swelling present. No deformity, effusion, erythema, ecchymosis, lacerations or bony tenderness. Decreased range of motion. Tenderness present. No LCL laxity or MCL laxity. Normal alignment, normal meniscus and normal patellar mobility.     Right ankle: Normal.  Neurological: She is alert and oriented to person, place, and time.  Skin: Skin is dry. She is not diaphoretic. No erythema. No pallor.  Psychiatric: Mood, memory, affect and judgment normal.  Nursing note and vitals  reviewed.  BP 102/70   Pulse 89   Temp 98.6 F (37 C)   Resp 20   Ht 5\' 3"  (1.6 m)   Wt 177 lb (80.3 kg)   LMP 11/29/2011   SpO2 94%   BMI 31.35 kg/m   X-Ray: right knee: No acute findings, mild joint space narrowing. Preliminary x-ray reading by Monia Pouch, FNP-C, [facility].   Assessment and Plan:  Mckynna was seen today for right knee pain.  Diagnoses and all orders for this visit:  Acute pain of right knee Xray with mild joint space narrowing. Will initiate conservative therapy today. Pt will defer PT for now as she does not have time. Medications as prescribed. Follow up in 4-6 weeks or sooner if needed.  -     DG Knee 1-2 Views Right; Future -     naproxen (NAPROSYN) 500  MG tablet; Take 1 tablet (500 mg total) by mouth 2 (two) times daily with a meal. -     methylPREDNISolone acetate (DEPO-MEDROL) injection 40 mg    Return in 4 weeks (on 09/17/2019), or if symptoms worsen or fail to improve, for knee pain.  The above assessment and management plan was discussed with the patient. The patient verbalized understanding of and has agreed to the management plan. Patient is aware to call the clinic if they develop any new symptoms or if symptoms fail to improve or worsen. Patient is aware when to return to the clinic for a follow-up visit. Patient educated on when it is appropriate to go to the emergency department.   Monia Pouch, FNP-C [facility] [address] [place], [place] [address] [phone]

## 2019-09-02 DIAGNOSIS — L309 Dermatitis, unspecified: Secondary | ICD-10-CM | POA: Diagnosis not present

## 2019-09-08 ENCOUNTER — Inpatient Hospital Stay (HOSPITAL_BASED_OUTPATIENT_CLINIC_OR_DEPARTMENT_OTHER): Payer: BC Managed Care – PPO | Admitting: Family

## 2019-09-08 ENCOUNTER — Inpatient Hospital Stay: Payer: BC Managed Care – PPO | Attending: Family

## 2019-09-08 ENCOUNTER — Encounter: Payer: Self-pay | Admitting: Family

## 2019-09-08 ENCOUNTER — Other Ambulatory Visit: Payer: Self-pay

## 2019-09-08 DIAGNOSIS — M25561 Pain in right knee: Secondary | ICD-10-CM | POA: Diagnosis not present

## 2019-09-08 DIAGNOSIS — Z85820 Personal history of malignant melanoma of skin: Secondary | ICD-10-CM | POA: Insufficient documentation

## 2019-09-08 DIAGNOSIS — Z79899 Other long term (current) drug therapy: Secondary | ICD-10-CM | POA: Diagnosis not present

## 2019-09-08 LAB — CBC WITH DIFFERENTIAL (CANCER CENTER ONLY)
Abs Immature Granulocytes: 0.03 10*3/uL (ref 0.00–0.07)
Basophils Absolute: 0.1 10*3/uL (ref 0.0–0.1)
Basophils Relative: 1 %
Eosinophils Absolute: 0.5 10*3/uL (ref 0.0–0.5)
Eosinophils Relative: 5 %
HCT: 43.8 % (ref 36.0–46.0)
Hemoglobin: 14.6 g/dL (ref 12.0–15.0)
Immature Granulocytes: 0 %
Lymphocytes Relative: 23 %
Lymphs Abs: 2.5 10*3/uL (ref 0.7–4.0)
MCH: 30.5 pg (ref 26.0–34.0)
MCHC: 33.3 g/dL (ref 30.0–36.0)
MCV: 91.4 fL (ref 80.0–100.0)
Monocytes Absolute: 0.5 10*3/uL (ref 0.1–1.0)
Monocytes Relative: 4 %
Neutro Abs: 7.5 10*3/uL (ref 1.7–7.7)
Neutrophils Relative %: 67 %
Platelet Count: 225 10*3/uL (ref 150–400)
RBC: 4.79 MIL/uL (ref 3.87–5.11)
RDW: 13.8 % (ref 11.5–15.5)
WBC Count: 11.1 10*3/uL — ABNORMAL HIGH (ref 4.0–10.5)
nRBC: 0 % (ref 0.0–0.2)

## 2019-09-08 LAB — CMP (CANCER CENTER ONLY)
ALT: 17 U/L (ref 0–44)
AST: 20 U/L (ref 15–41)
Albumin: 4.7 g/dL (ref 3.5–5.0)
Alkaline Phosphatase: 67 U/L (ref 38–126)
Anion gap: 8 (ref 5–15)
BUN: 19 mg/dL (ref 6–20)
CO2: 35 mmol/L — ABNORMAL HIGH (ref 22–32)
Calcium: 10.3 mg/dL (ref 8.9–10.3)
Chloride: 101 mmol/L (ref 98–111)
Creatinine: 0.89 mg/dL (ref 0.44–1.00)
GFR, Est AFR Am: >60 mL/min (ref >60)
GFR, Estimated: >60 mL/min (ref >60)
Glucose, Bld: 104 mg/dL — ABNORMAL HIGH (ref 70–99)
Potassium: 3.7 mmol/L (ref 3.5–5.1)
Sodium: 144 mmol/L (ref 135–145)
Total Bilirubin: 0.6 mg/dL (ref 0.3–1.2)
Total Protein: 7.6 g/dL (ref 6.5–8.1)

## 2019-09-08 LAB — IRON AND TIBC
Iron: 87 ug/dL (ref 41–142)
Saturation Ratios: 23 % (ref 21–57)
TIBC: 380 ug/dL (ref 236–444)
UIBC: 293 ug/dL (ref 120–384)

## 2019-09-08 LAB — FERRITIN: Ferritin: 51 ng/mL (ref 11–307)

## 2019-09-08 NOTE — Progress Notes (Signed)
Hematology and Oncology Follow Up Visit  Sarah Garrett [id number] 05-Jan-1961 59 y.o. 09/08/2019   Principle Diagnosis:  Hemochromatosis (heterozygote mutation for C282Y and H63D) History of stage IA melanoma of the right arm  Current Therapy:   Phlebotomy to maintain ferritin less than 100 or iron saturation less than 30%   Interim History:  Sarah Garrett is here today for follow-up. She is doing well and has no complaints at this time.  Her last phlebotomy was in 2019. Hct is 43.8 and iron studies are pending.  No fever, chills, n/v, cough, rash, dizziness, headache, vision changes/loss, SOB, chest pain, palpitations, abdominal pain or changes in bowel or bladder habits.  No swelling, numbness or tingling in her extremities.  She has pain in the right knee and states that there is thinning between the joints. She states that her PCP is sending her to see an orthopedist for further eval and treatment.  No falls or syncopal episodes to report.  No bleeding, bruising or petechiae.  She has maintained a good appetite and is staying well hydrated. Her weight is stable.   ECOG Performance Status: 0 - Asymptomatic  Medications:  Allergies as of 09/08/2019      Reactions   Aspirin Swelling   Swelling of mouth and throat   Alpha-gal    Ace Inhibitors Other (See Comments)   Per [id number]   Actos [pioglitazone Hydrochloride] Other (See Comments)   Per pt: unknown   Ciprofloxacin Other (See Comments)   Unknown reaction   Crestor [rosuvastatin Calcium] Other (See Comments)   Per pt: unknown   Pneumococcal Vaccines Rash   Sulfonamide Derivatives Other (See Comments)   Per pt: unknown      Medication List       Accurate as of September 08, 2019  8:48 AM. If you have any questions, ask your nurse or doctor.        albuterol 108 (90 Base) MCG/ACT inhaler Commonly known as: VENTOLIN HFA TAKE 2 PUFFS BY MOUTH EVERY 6 HOURS AS NEEDED   cetirizine 10 MG tablet Commonly known as:  ZYRTEC Take 10 mg by mouth daily.   EPINEPHrine 0.3 mg/0.3 mL Soaj injection Commonly known as: EPI-PEN Inject 0.3 mLs (0.3 mg total) into the muscle as needed for anaphylaxis.   esomeprazole 40 MG capsule Commonly known as: NexIUM Take 1 capsule (40 mg total) by mouth as needed.   famotidine 20 MG tablet Commonly known as: PEPCID TAKE 1 TABLET BY MOUTH TWICE A DAY   Fluticasone-Salmeterol 100-50 MCG/DOSE Aepb Commonly known as: Advair Diskus TAKE 1 PUFF BY MOUTH TWICE A DAY   furosemide 40 MG tablet Commonly known as: LASIX Take 1 tablet (40 mg total) by mouth daily as needed.   hydrOXYzine 50 MG tablet Commonly known as: ATARAX/VISTARIL Take 1 tablet (50 mg total) by mouth 3 (three) times daily as needed.   hyoscyamine 0.125 MG SL tablet Commonly known as: LEVSIN SL Place 1 tablet (0.125 mg total) under the tongue as needed. Place 1-2 tablet under the tongue until it dissolves as needed   icosapent Ethyl 1 g capsule Commonly known as: Vascepa Take 1 capsule (1 g total) by mouth 2 (two) times daily.   levothyroxine 125 MCG tablet Commonly known as: SYNTHROID Take 1 tablet (125 mcg total) by mouth daily.   losartan-hydrochlorothiazide 100-12.5 MG tablet Commonly known as: HYZAAR Take 1 tablet by mouth daily. as directed   meclizine 25 MG tablet Commonly known as: ANTIVERT Take 1 tablet (  25 mg total) by mouth 3 (three) times daily as needed for dizziness.   montelukast 10 MG tablet Commonly known as: SINGULAIR TAKE 1 TABLET BY MOUTH EVERYDAY AT BEDTIME   naproxen 500 MG tablet Commonly known as: NAPROSYN Take 1 tablet (500 mg total) by mouth 2 (two) times daily with a meal.   OVER THE COUNTER MEDICATION Vit C and Zinc daily   potassium chloride 10 MEQ tablet Commonly known as: KLOR-CON Take 1 tablet (10 mEq total) by mouth daily.   rosuvastatin 20 MG tablet Commonly known as: CRESTOR TAKE 1 TABLET (20 MG TOTAL) BY MOUTH DAILY.   Vitamin D  (Ergocalciferol) 1.25 MG (50000 UNIT) Caps capsule Commonly known as: DRISDOL Take 1 capsule (50,000 Units total) by mouth every 7 (seven) days.       Allergies:  Allergies  Allergen Reactions  . Aspirin Swelling    Swelling of mouth and throat  . Alpha-Gal   . Ace Inhibitors Other (See Comments)    Per [id number]  . Actos [Pioglitazone Hydrochloride] Other (See Comments)    Per pt: unknown  . Ciprofloxacin Other (See Comments)    Unknown reaction  . Crestor [Rosuvastatin Calcium] Other (See Comments)    Per pt: unknown  . Pneumococcal Vaccines Rash  . Sulfonamide Derivatives Other (See Comments)    Per pt: unknown    Past Medical History, Surgical history, Social history, and Family History were reviewed and updated.  Review of Systems: All other 10 point review of systems is negative.   Physical Exam:  vitals were not taken for this visit.   Wt Readings from Last 3 Encounters:  08/20/19 177 lb (80.3 kg)  03/10/19 192 lb (87.1 kg)  02/14/19 195 lb (88.5 kg)    Ocular: Sclerae unicteric, pupils equal, round and reactive to light Ear-nose-throat: Oropharynx clear, dentition fair Lymphatic: No cervical or supraclavicular adenopathy Lungs no rales or rhonchi, good excursion bilaterally Heart regular rate and rhythm, no murmur appreciated Abd soft, nontender, positive bowel sounds, no liver or spleen tip palpated on exam, no fluid wave  MSK no focal spinal tenderness, no joint edema Neuro: non-focal, well-oriented, appropriate affect Breasts: Deferred   Lab Results  Component Value Date   WBC 11.1 (H) 09/08/2019   HGB 14.6 09/08/2019   HCT 43.8 09/08/2019   MCV 91.4 09/08/2019   PLT 225 09/08/2019   Lab Results  Component Value Date   FERRITIN 57 02/14/2019   IRON 83 02/14/2019   TIBC 340 02/14/2019   UIBC 257 02/14/2019   IRONPCTSAT 24 02/14/2019   Lab Results  Component Value Date   RETICCTPCT 1.6 07/07/2011   RETICCTPCT 1.6 07/07/2011   RETICCTPCT  1.6 07/07/2011   RBC 4.79 09/08/2019   RETICCTABS 76.0 07/07/2011   RETICCTABS 76.0 07/07/2011   RETICCTABS 76.0 07/07/2011   No results found for: KPAFRELGTCHN, LAMBDASER, KAPLAMBRATIO No results found for: IGGSERUM, IGA, IGMSERUM No results found for: Odetta Pink, SPEI   Chemistry      Component Value Date/Time   NA 144 03/05/2019 1506   NA 145 08/03/2017 0905   NA 140 07/19/2016 0834   K 3.4 (L) 03/05/2019 1506   K 3.6 08/03/2017 0905   K 3.2 (L) 07/19/2016 0834   CL 98 03/05/2019 1506   CL 102 08/03/2017 0905   CO2 27 03/05/2019 1506   CO2 31 08/03/2017 0905   CO2 27 07/19/2016 0834   BUN 9 03/05/2019 1506   BUN  9 08/03/2017 0905   BUN 11.0 07/19/2016 0834   CREATININE 0.70 03/05/2019 1506   CREATININE 0.83 02/14/2019 1005   CREATININE 0.8 08/03/2017 0905   CREATININE 0.8 07/19/2016 0834      Component Value Date/Time   CALCIUM 10.0 03/05/2019 1506   CALCIUM 10.3 08/03/2017 0905   CALCIUM 10.1 07/19/2016 0834   ALKPHOS 80 03/05/2019 1506   ALKPHOS 84 08/03/2017 0905   ALKPHOS 85 07/19/2016 0834   AST 29 03/05/2019 1506   AST 17 02/14/2019 1005   AST 26 07/19/2016 0834   ALT 26 03/05/2019 1506   ALT 23 02/14/2019 1005   ALT 39 08/03/2017 0905   ALT 25 07/19/2016 0834   BILITOT 0.5 03/05/2019 1506   BILITOT 0.6 02/14/2019 1005   BILITOT 0.81 07/19/2016 0834       Impression and Plan: Sarah Garrett is a very pleasant 59 yo caucasian female with with hereditary hemochromatosis. We will see how her iron studies look and bring her back in for phlebotomy if needed.  We will go ahead and plan to see her back in another 8 months.  She can contact our office with any questions or concerns. We can certainly see her sooner if needed.   Laverna Peace, NP 2/8/20218:48 AM

## 2019-09-17 ENCOUNTER — Telehealth: Payer: Self-pay | Admitting: Family Medicine

## 2019-09-17 NOTE — Telephone Encounter (Signed)
Pt called stating that she received a missed call from [name]. Requested that [facility] call her back when available.

## 2019-09-17 NOTE — Telephone Encounter (Signed)
Sarah Garrett, I looked in her chart to see if I could figure out why you may have called her but I did not see anything.

## 2019-09-17 NOTE — Telephone Encounter (Signed)
Called pt.

## 2019-09-18 ENCOUNTER — Ambulatory Visit: Payer: BC Managed Care – PPO | Admitting: Family Medicine

## 2019-09-24 ENCOUNTER — Other Ambulatory Visit: Payer: Self-pay

## 2019-09-24 ENCOUNTER — Other Ambulatory Visit: Payer: BC Managed Care – PPO

## 2019-09-24 DIAGNOSIS — I10 Essential (primary) hypertension: Secondary | ICD-10-CM | POA: Diagnosis not present

## 2019-09-24 DIAGNOSIS — E7800 Pure hypercholesterolemia, unspecified: Secondary | ICD-10-CM | POA: Diagnosis not present

## 2019-09-24 DIAGNOSIS — K219 Gastro-esophageal reflux disease without esophagitis: Secondary | ICD-10-CM | POA: Diagnosis not present

## 2019-09-24 DIAGNOSIS — E032 Hypothyroidism due to medicaments and other exogenous substances: Secondary | ICD-10-CM

## 2019-09-25 ENCOUNTER — Telehealth: Payer: Self-pay

## 2019-09-25 LAB — CBC WITH DIFFERENTIAL/PLATELET
Basophils Absolute: 0.1 10*3/uL (ref 0.0–0.2)
Basos: 1 %
EOS (ABSOLUTE): 0.8 10*3/uL — ABNORMAL HIGH (ref 0.0–0.4)
Eos: 11 %
Hematocrit: 42.5 % (ref 34.0–46.6)
Hemoglobin: 14.2 g/dL (ref 11.1–15.9)
Immature Grans (Abs): 0 10*3/uL (ref 0.0–0.1)
Immature Granulocytes: 0 %
Lymphocytes Absolute: 2.4 10*3/uL (ref 0.7–3.1)
Lymphs: 34 %
MCH: 30.8 pg (ref 26.6–33.0)
MCHC: 33.4 g/dL (ref 31.5–35.7)
MCV: 92 fL (ref 79–97)
Monocytes Absolute: 0.4 10*3/uL (ref 0.1–0.9)
Monocytes: 5 %
Neutrophils Absolute: 3.5 10*3/uL (ref 1.4–7.0)
Neutrophils: 49 %
Platelets: 196 10*3/uL (ref 150–450)
RBC: 4.61 x10E6/uL (ref 3.77–5.28)
RDW: 13.6 % (ref 11.7–15.4)
WBC: 7.2 10*3/uL (ref 3.4–10.8)

## 2019-09-25 LAB — CMP14+EGFR
ALT: 27 IU/L (ref 0–32)
AST: 27 IU/L (ref 0–40)
Albumin/Globulin Ratio: 1.8 (ref 1.2–2.2)
Albumin: 4.5 g/dL (ref 3.8–4.9)
Alkaline Phosphatase: 118 IU/L — ABNORMAL HIGH (ref 39–117)
BUN/Creatinine Ratio: 19 (ref 12–28)
BUN: 15 mg/dL (ref 8–27)
Bilirubin Total: 0.3 mg/dL (ref 0.0–1.2)
CO2: 26 mmol/L (ref 20–29)
Calcium: 9.9 mg/dL (ref 8.7–10.3)
Chloride: 102 mmol/L (ref 96–106)
Creatinine, Ser: 0.78 mg/dL (ref 0.57–1.00)
GFR calc Af Amer: 96 mL/min/{1.73_m2} (ref >59)
GFR calc non Af Amer: 83 mL/min/{1.73_m2} (ref >59)
Globulin, Total: 2.5 g/dL (ref 1.5–4.5)
Glucose: 83 mg/dL (ref 65–99)
Potassium: 3.8 mmol/L (ref 3.5–5.2)
Sodium: 145 mmol/L — ABNORMAL HIGH (ref 134–144)
Total Protein: 7 g/dL (ref 6.0–8.5)

## 2019-09-25 LAB — THYROID PANEL WITH TSH
Free Thyroxine Index: 3 (ref 1.2–4.9)
T3 Uptake Ratio: 27 % (ref 24–39)
T4, Total: 11.1 ug/dL (ref 4.5–12.0)
TSH: 3.89 u[IU]/mL (ref 0.450–4.500)

## 2019-09-25 LAB — LIPID PANEL
Chol/HDL Ratio: 2.7 ratio (ref 0.0–4.4)
Cholesterol, Total: 139 mg/dL (ref 100–199)
HDL: 52 mg/dL (ref >39)
LDL Chol Calc (NIH): 68 mg/dL (ref 0–99)
Triglycerides: 104 mg/dL (ref 0–149)
VLDL Cholesterol Cal: 19 mg/dL (ref 5–40)

## 2019-09-25 NOTE — Telephone Encounter (Signed)
Pt is fine to send urine at tomorrow at visit with Rakes. Symptoms have improved but pt is still concerned.

## 2019-09-25 NOTE — Telephone Encounter (Signed)
Called with lab results.  Patient asked about urine test results and I did not see them.  Patient states that she gave a urine sample for testing due to pain/discomfort with urination and dark urine.  Can you please look through the chart behind me.  Patient has appointment with Rakes tomorrow 09/26/2019.

## 2019-09-26 ENCOUNTER — Encounter: Payer: Self-pay | Admitting: Family Medicine

## 2019-09-26 ENCOUNTER — Other Ambulatory Visit: Payer: Self-pay

## 2019-09-26 ENCOUNTER — Ambulatory Visit: Payer: BC Managed Care – PPO | Admitting: Family Medicine

## 2019-09-26 VITALS — BP 116/72 | HR 71 | Temp 98.4°F | Resp 20 | Ht 63.0 in | Wt 183.0 lb

## 2019-09-26 DIAGNOSIS — E7800 Pure hypercholesterolemia, unspecified: Secondary | ICD-10-CM | POA: Diagnosis not present

## 2019-09-26 DIAGNOSIS — R21 Rash and other nonspecific skin eruption: Secondary | ICD-10-CM

## 2019-09-26 DIAGNOSIS — N3001 Acute cystitis with hematuria: Secondary | ICD-10-CM | POA: Diagnosis not present

## 2019-09-26 DIAGNOSIS — E032 Hypothyroidism due to medicaments and other exogenous substances: Secondary | ICD-10-CM | POA: Diagnosis not present

## 2019-09-26 DIAGNOSIS — L239 Allergic contact dermatitis, unspecified cause: Secondary | ICD-10-CM

## 2019-09-26 DIAGNOSIS — E1051 Type 1 diabetes mellitus with diabetic peripheral angiopathy without gangrene: Secondary | ICD-10-CM

## 2019-09-26 DIAGNOSIS — E559 Vitamin D deficiency, unspecified: Secondary | ICD-10-CM

## 2019-09-26 DIAGNOSIS — R1030 Lower abdominal pain, unspecified: Secondary | ICD-10-CM

## 2019-09-26 DIAGNOSIS — R748 Abnormal levels of other serum enzymes: Secondary | ICD-10-CM

## 2019-09-26 DIAGNOSIS — I10 Essential (primary) hypertension: Secondary | ICD-10-CM | POA: Diagnosis not present

## 2019-09-26 DIAGNOSIS — M25561 Pain in right knee: Secondary | ICD-10-CM

## 2019-09-26 DIAGNOSIS — K219 Gastro-esophageal reflux disease without esophagitis: Secondary | ICD-10-CM

## 2019-09-26 DIAGNOSIS — J42 Unspecified chronic bronchitis: Secondary | ICD-10-CM

## 2019-09-26 DIAGNOSIS — J4530 Mild persistent asthma, uncomplicated: Secondary | ICD-10-CM

## 2019-09-26 MED ORDER — ROSUVASTATIN CALCIUM 20 MG PO TABS: ORAL_TABLET | ORAL | 3 refills | Status: DC

## 2019-09-26 MED ORDER — ESOMEPRAZOLE MAGNESIUM 40 MG PO CPDR: 40.0000 mg | DELAYED_RELEASE_CAPSULE | ORAL | 6 refills | Status: DC | PRN

## 2019-09-26 MED ORDER — FAMOTIDINE 20 MG PO TABS: 20.0000 mg | ORAL_TABLET | Freq: Two times a day (BID) | ORAL | 5 refills | Status: DC

## 2019-09-26 MED ORDER — VITAMIN D (ERGOCALCIFEROL) 1.25 MG (50000 UNIT) PO CAPS: 50000.0000 [IU] | ORAL_CAPSULE | ORAL | 3 refills | Status: DC

## 2019-09-26 MED ORDER — MONTELUKAST SODIUM 10 MG PO TABS: ORAL_TABLET | ORAL | 3 refills | Status: DC

## 2019-09-26 MED ORDER — FLUTICASONE-SALMETEROL 100-50 MCG/DOSE IN AEPB: INHALATION_SPRAY | RESPIRATORY_TRACT | 3 refills | Status: DC

## 2019-09-26 MED ORDER — FUROSEMIDE 40 MG PO TABS: 40.0000 mg | ORAL_TABLET | Freq: Every day | ORAL | 3 refills | Status: DC | PRN

## 2019-09-26 MED ORDER — HYDROXYZINE HCL 50 MG PO TABS: 50.0000 mg | ORAL_TABLET | Freq: Three times a day (TID) | ORAL | 0 refills | Status: DC | PRN

## 2019-09-26 MED ORDER — LEVOTHYROXINE SODIUM 125 MCG PO TABS: 125.0000 ug | ORAL_TABLET | Freq: Every day | ORAL | 3 refills | Status: DC

## 2019-09-26 MED ORDER — CEPHALEXIN 500 MG PO CAPS: 500.0000 mg | ORAL_CAPSULE | Freq: Three times a day (TID) | ORAL | 0 refills | Status: AC

## 2019-09-26 MED ORDER — LOSARTAN POTASSIUM-HCTZ 100-12.5 MG PO TABS: 1.0000 | ORAL_TABLET | Freq: Every day | ORAL | 3 refills | Status: DC

## 2019-09-26 MED ORDER — NAPROXEN 500 MG PO TABS: 500.0000 mg | ORAL_TABLET | Freq: Two times a day (BID) | ORAL | 0 refills | Status: AC

## 2019-09-26 MED ORDER — ICOSAPENT ETHYL 1 G PO CAPS: 1.0000 g | ORAL_CAPSULE | Freq: Two times a day (BID) | ORAL | 3 refills | Status: DC

## 2019-09-26 MED ORDER — DICLOFENAC SODIUM 1 % EX GEL: 2.0000 g | Freq: Four times a day (QID) | CUTANEOUS | 5 refills | Status: DC

## 2019-09-26 MED ORDER — POTASSIUM CHLORIDE ER 10 MEQ PO TBCR: 10.0000 meq | EXTENDED_RELEASE_TABLET | Freq: Every day | ORAL | 3 refills | Status: DC

## 2019-09-26 MED ORDER — ALBUTEROL SULFATE HFA 108 (90 BASE) MCG/ACT IN AERS: INHALATION_SPRAY | RESPIRATORY_TRACT | 11 refills | Status: DC

## 2019-09-26 NOTE — Patient Instructions (Signed)

## 2019-09-26 NOTE — Progress Notes (Signed)
Subjective:  Patient ID: Sarah Garrett, female    DOB: May 05, 1961, 59 y.o.   MRN: [id number]  Patient Care Team: Baruch Gouty, FNP as PCP - General (Family Medicine) Druscilla Brownie, MD as Referring Physician (Dermatology) [name], MD as Consulting Physician (Hematology and Oncology)   Chief Complaint:  Medical Management of Chronic Issues and Hypothyroidism   HPI: Sarah Garrett is a 59 y.o. female presenting on 09/26/2019 for management of chronic issues. Patient also endorses some lower abdominal pain that began last week. She reports pain radiated around to her lower back. She denies any urinary frequency, anuria or dysuria but did endorse some constipation and diarrhea. She states the pain and symptoms have improved some and now she only has mild lower abdominal pressure.  1. Essential hypertension 116/72 in office today, well controlled Complaint with meds - yes  Current Medications - hyzaar Checking BP at home ranging-does not check at home Exercising Regularly - no, walks outside occasionally Watching Salt intake - yes, does not add salt to foods  Pertinent ROS:  Headache - no Fatigue -no Visual Disturbances - no Chest pain - no Dyspnea - no Palpitations -no LE edema - no They report good compliance with medications and can restate their regimen by memory. No medication side effects.  Family, social, and smoking history reviewed.   BP Readings from Last 3 Encounters:  09/26/19 116/72  09/08/19 116/88  08/20/19 102/70   CMP Latest Ref Rng & Units 09/24/2019 09/08/2019 03/05/2019  Glucose 65 - 99 mg/dL 83 104(H) 85  BUN 8 - 27 mg/dL 15 19 9   Creatinine 0.57 - 1.00 mg/dL 0.78 0.89 0.70  Sodium 134 - 144 mmol/L 145(H) 144 144  Potassium 3.5 - 5.2 mmol/L 3.8 3.7 3.4(L)  Chloride 96 - 106 mmol/L 102 101 98  CO2 20 - 29 mmol/L 26 35(H) 27  Calcium 8.7 - 10.3 mg/dL 9.9 10.3 10.0  Total Protein 6.0 - 8.5 g/dL 7.0 7.6 7.1  Total Bilirubin 0.0 - 1.2  mg/dL 0.3 0.6 0.5  Alkaline Phos 39 - 117 IU/L 118(H) 67 80  AST 0 - 40 IU/L 27 20 29   ALT 0 - 32 IU/L 27 17 26      2. Pure hypercholesterolemia Compliant with medications - yes Current medications - vascepa and crestor Side effects from medications - no, denies myalgias   Diet - diet high in lean protein such as chicken and fish, fruits and vegetables.  Exercise - walks outside occasionally  Lab Results  Component Value Date   CHOL 139 09/24/2019   HDL 52 09/24/2019   LDLCALC 68 09/24/2019   TRIG 104 09/24/2019   CHOLHDL 2.7 09/24/2019     Family and personal medical history reviewed. Smoking and ETOH history reviewed.    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 Gastroesophageal reflux disease, unspecified whether esophagitis present She reports flares with reflux occasionally, despite taking medications  Compliant with medications - yes Current medications - reports she  alternates nexium and prilosec Adverse side effects - no  DEXA if on PPI - last dexa 04/2018. Cough - no  Sore throat - no Voice change - no Hemoptysis - {no Dysphagia or dyspepsia - no Water brash - no Red Flags (weight loss, hematochezia, melena, weight loss, early satiety, fevers, odynophagia, or persistent vomiting) -no     Relevant past medical, surgical, family, and social history reviewed and updated as indicated.  Allergies and medications reviewed and updated. Date reviewed: Chart in Epic.   Past Medical History:  Diagnosis Date  . Anemia   . Diverticulosis   . GERD  (gastroesophageal reflux disease)   . H/O adenoidectomy   . Hemochromatosis 07/07/2011  . Hyperlipidemia   . Hypertension   . Melanoma ([place])    right shoulder  . Thyroid disease     Past Surgical History:  Procedure Laterality Date  . BREAST BIOPSY  2004   left; benign  . MELANOMA EXCISION  09/28/11   right shoulder  . TONSILLECTOMY AND ADENOIDECTOMY      Social History   Socioeconomic History  . Marital status: Married    Spouse name: Not on file  . Number of children: Not on file  . Years of education: Not on file  . Highest education level: Not on file  Occupational History  . Not on file  Tobacco Use  . Smoking status: Former Smoker    Packs/day: 0.25    Years: 3.00    Pack years: 0.75    Types: Cigarettes    Start date: 08/16/1974    Quit date: 01/14/1978    Years since quitting: 41.7  . Smokeless tobacco: Never Used  . Tobacco comment: quit 37 years ago  Substance and Sexual Activity  . Alcohol use: No    Alcohol/week: 0.0 standard drinks  . Drug use: No  . Sexual activity: Not on file  Other Topics Concern  . Not on file  Social History Narrative  . Not on file   Social Determinants of Health   Financial Resource Strain:   . Difficulty of Paying Living Expenses: Not on file  Food Insecurity:   . Worried About Charity fundraiser in the Last Year: Not on file  . Ran Out of Food in the Last Year: Not on file  Transportation Needs:   . Lack of Transportation (Medical): Not on file  . Lack of Transportation (Non-Medical): Not on file  Physical Activity:   . Days of Exercise per Week: Not on file  . Minutes of Exercise per Session: Not on file  Stress:   . Feeling of Stress : Not on file  Social Connections:   . Frequency of Communication with Friends and Family: Not on file  . Frequency of Social Gatherings with Friends and Family: Not on file  . Attends Religious Services: Not on file  . Active Member of Clubs or Organizations: Not on file  .  Attends Archivist Meetings: Not on file  . Marital Status: Not on file  Intimate Partner Violence:   . Fear of Current or Ex-Partner: Not on file  . Emotionally Abused: Not on file  . Physically Abused: Not on file  . Sexually Abused: Not on file    Outpatient Encounter Medications as of 09/26/2019  Medication Sig  . albuterol (PROVENTIL HFA;VENTOLIN HFA) 108 (90 Base) MCG/ACT inhaler TAKE 2 PUFFS BY MOUTH EVERY 6 HOURS AS NEEDED  . cetirizine (ZYRTEC) 10 MG tablet Take 10 mg  by mouth daily.    Marland Kitchen esomeprazole (NEXIUM) 40 MG capsule Take 1 capsule (40 mg total) by mouth as needed.  . famotidine (PEPCID) 20 MG tablet TAKE 1 TABLET BY MOUTH TWICE A DAY  . Fluticasone-Salmeterol (ADVAIR DISKUS) 100-50 MCG/DOSE AEPB TAKE 1 PUFF BY MOUTH TWICE A DAY  . furosemide (LASIX) 40 MG tablet Take 1 tablet (40 mg total) by mouth daily as needed.  . hydrOXYzine (ATARAX/VISTARIL) 50 MG tablet Take 1 tablet (50 mg total) by mouth 3 (three) times daily as needed.  . hyoscyamine (LEVSIN SL) 0.125 MG SL tablet Place 1 tablet (0.125 mg total) under the tongue as needed. Place 1-2 tablet under the tongue until it dissolves as needed  . Icosapent Ethyl (VASCEPA) 1 g CAPS Take 1 capsule (1 g total) by mouth 2 (two) times daily.  Marland Kitchen levothyroxine (SYNTHROID, LEVOTHROID) 125 MCG tablet Take 1 tablet (125 mcg total) by mouth daily.  Marland Kitchen losartan-hydrochlorothiazide (HYZAAR) 100-12.5 MG tablet Take 1 tablet by mouth daily. as directed  . meclizine (ANTIVERT) 25 MG tablet Take 1 tablet (25 mg total) by mouth 3 (three) times daily as needed for dizziness.  . montelukast (SINGULAIR) 10 MG tablet TAKE 1 TABLET BY MOUTH EVERYDAY AT BEDTIME  . naproxen (NAPROSYN) 500 MG tablet Take 1 tablet (500 mg total) by mouth 2 (two) times daily with a meal.  . potassium chloride (K-DUR) 10 MEQ tablet Take 1 tablet (10 mEq total) by mouth daily.  . rosuvastatin (CRESTOR) 20 MG tablet TAKE 1 TABLET (20 MG TOTAL) BY MOUTH DAILY.    Marland Kitchen Vitamin D, Ergocalciferol, (DRISDOL) 1.25 MG (50000 UT) CAPS capsule Take 1 capsule (50,000 Units total) by mouth every 7 (seven) days.  Marland Kitchen EPINEPHrine 0.3 mg/0.3 mL IJ SOAJ injection Inject 0.3 mLs (0.3 mg total) into the muscle as needed for anaphylaxis. (Patient not taking: Reported on 09/26/2019)  . [DISCONTINUED] OVER THE COUNTER MEDICATION Vit C and Zinc daily   No facility-administered encounter medications on file as of 09/26/2019.    Allergies  Allergen Reactions  . Aspirin Swelling    Swelling of mouth and throat  . Ace Inhibitors Other (See Comments)    Per QI:[id number]  . Actos [Pioglitazone Hydrochloride] Other (See Comments)    Per pt: unknown  . Alpha-Gal Other (See Comments)  . Ciprofloxacin Other (See Comments)    Unknown reaction  . Crestor [Rosuvastatin Calcium] Other (See Comments)    Per pt: unknown  . Pneumococcal Vaccines Rash  . Sulfonamide Derivatives Other (See Comments)    Per pt: unknown    Review of Systems  Constitutional: Negative for activity change, fatigue and unexpected weight change.  HENT: Negative.   Eyes: Negative.   Respiratory: Negative for chest tightness and shortness of breath.   Cardiovascular: Negative for chest pain, palpitations and leg swelling.  Gastrointestinal: Positive for abdominal pain, constipation and diarrhea. Negative for nausea and vomiting.  Endocrine: Negative for cold intolerance, heat intolerance, polydipsia and polyphagia.  Genitourinary: Negative for difficulty urinating, dysuria, flank pain and frequency.  Musculoskeletal: Negative for gait problem and myalgias.  Skin: Negative for color change.  Allergic/Immunologic: Negative.   Neurological: Negative for dizziness, weakness and numbness.  Hematological: Negative.   Psychiatric/Behavioral: Negative for behavioral problems and sleep disturbance. The patient is not nervous/anxious.   All other systems reviewed and are negative.       Objective:  BP 116/72    Pulse 71   Temp 98.4 F (36.9 C)   Resp 20  Ht 5' 3"  (1.6 m)   Wt 183 lb (83 kg)   LMP 11/29/2011   SpO2 96%   BMI 32.42 kg/m    Wt Readings from Last 3 Encounters:  09/26/19 183 lb (83 kg)  09/08/19 180 lb 1.9 oz (81.7 kg)  08/20/19 177 lb (80.3 kg)    Physical Exam Vitals and nursing note reviewed.  Constitutional:      Appearance: Normal appearance. She is normal weight.  HENT:     Head: Normocephalic and atraumatic.     Nose: Nose normal.     Mouth/Throat:     Mouth: Mucous membranes are moist.  Eyes:     Pupils: Pupils are equal, round, and reactive to light.  Cardiovascular:     Rate and Rhythm: Normal rate and regular rhythm.     Pulses: Normal pulses.     Heart sounds: Normal heart sounds.  Pulmonary:     Effort: Pulmonary effort is normal.     Breath sounds: Normal breath sounds.  Abdominal:     General: Abdomen is flat. Bowel sounds are normal.     Palpations: Abdomen is soft.     Tenderness: There is no abdominal tenderness.  Musculoskeletal:        General: Normal range of motion.     Cervical back: Normal range of motion and neck supple.  Skin:    General: Skin is warm and dry.     Capillary Refill: Capillary refill takes less than 2 seconds.  Neurological:     General: No focal deficit present.     Mental Status: She is alert and oriented to person, place, and time. Mental status is at baseline.  Psychiatric:        Mood and Affect: Mood normal.        Behavior: Behavior normal.        Thought Content: Thought content normal.        Judgment: Judgment normal.     Results for orders placed or performed in visit on 09/24/19  CBC with Differential/Platelet  Result Value Ref Range   WBC 7.2 3.4 - 10.8 x10E3/uL   RBC 4.61 3.77 - 5.28 x10E6/uL   Hemoglobin 14.2 11.1 - 15.9 g/dL   Hematocrit 42.5 34.0 - 46.6 %   MCV 92 79 - 97 fL   MCH 30.8 26.6 - 33.0 pg   MCHC 33.4 31.5 - 35.7 g/dL   RDW 13.6 11.7 - 15.4 %   Platelets 196 150 - 450  x10E3/uL   Neutrophils 49 Not Estab. %   Lymphs 34 Not Estab. %   Monocytes 5 Not Estab. %   Eos 11 Not Estab. %   Basos 1 Not Estab. %   Neutrophils Absolute 3.5 1.4 - 7.0 x10E3/uL   Lymphocytes Absolute 2.4 0.7 - 3.1 x10E3/uL   Monocytes Absolute 0.4 0.1 - 0.9 x10E3/uL   EOS (ABSOLUTE) 0.8 (H) 0.0 - 0.4 x10E3/uL   Basophils Absolute 0.1 0.0 - 0.2 x10E3/uL   Immature Granulocytes 0 Not Estab. %   Immature Grans (Abs) 0.0 0.0 - 0.1 x10E3/uL  CMP14+EGFR  Result Value Ref Range   Glucose 83 65 - 99 mg/dL   BUN 15 8 - 27 mg/dL   Creatinine, Ser 0.78 0.57 - 1.00 mg/dL   GFR calc non Af Amer 83 >59 mL/min/1.73   GFR calc Af Amer 96 >59 mL/min/1.73   BUN/Creatinine Ratio 19 12 - 28   Sodium 145 (H) 134 - 144 mmol/L  Potassium 3.8 3.5 - 5.2 mmol/L   Chloride 102 96 - 106 mmol/L   CO2 26 20 - 29 mmol/L   Calcium 9.9 8.7 - 10.3 mg/dL   Total Protein 7.0 6.0 - 8.5 g/dL   Albumin 4.5 3.8 - 4.9 g/dL   Globulin, Total 2.5 1.5 - 4.5 g/dL   Albumin/Globulin Ratio 1.8 1.2 - 2.2   Bilirubin Total 0.3 0.0 - 1.2 mg/dL   Alkaline Phosphatase 118 (H) 39 - 117 IU/L   AST 27 0 - 40 IU/L   ALT 27 0 - 32 IU/L  Lipid panel  Result Value Ref Range   Cholesterol, Total 139 100 - 199 mg/dL   Triglycerides 104 0 - 149 mg/dL   HDL 52 >39 mg/dL   VLDL Cholesterol Cal 19 5 - 40 mg/dL   LDL Chol Calc (NIH) 68 0 - 99 mg/dL   Chol/HDL Ratio 2.7 0.0 - 4.4 ratio  Thyroid Panel With TSH  Result Value Ref Range   TSH 3.890 0.450 - 4.500 uIU/mL   T4, Total 11.1 4.5 - 12.0 ug/dL   T3 Uptake Ratio 27 24 - 39 %   Free Thyroxine Index 3.0 1.2 - 4.9       Pertinent labs & imaging results that were available during my care of the patient were reviewed by me and considered in my medical decision making.  Assessment & Plan:  Sarah Garrett was seen today for medical management of chronic issues and hypothyroidism.  Diagnoses and all orders for this visit:  Essential hypertension BP well controlled, continue low  sodium diet and current medication regimen with good result.    Pure hypercholesterolemia Continue current medication regimen with good result. Continue diet high in lean protein and vegetables, avoiding fried/fatty foods.   Hypothyroidism due to medication Continue current medication regimen with good result  Gastroesophageal reflux disease, unspecified whether esophagitis present Continue current medication regimen with good result  Lower abdominal pain -     Urinalysis, Complete  Elevated alkaline phosphatase level  Will check below today for underlying cause.   -alkaline phosphtase  -ggt   Acute cystitis with hematuria  -UA and Urine culture in office today.  UA positive for trace blood and leukocytes.   Total time spent with patient 40 mintues.  Greater than 50% of encounter spent in coordination of care/counseling.  Continue all other maintenance medications.  Follow up plan: Return in about 6 months (around 03/25/2020), or if symptoms worsen or fail to improve.  Continue healthy lifestyle choices, including diet (rich in fruits, vegetables, and lean proteins, and low in salt and simple carbohydrates) and exercise (at least 30 minutes of moderate physical activity daily).  Educational handout given for UTI  The above assessment and management plan was discussed with the patient. The patient verbalized understanding of and has agreed to the management plan. Patient is aware to call the clinic if they develop any new symptoms or if symptoms persist or worsen. Patient is aware when to return to the clinic for a follow-up visit. Patient educated on when it is appropriate to go to the emergency department.   Scherrie Gerlach, BSN, RN, AGNP-Student    I personally was present during the history, physical exam, and medical decision-making activities of this service and have verified that the service and findings are accurately documented in the nurse practitioner student's  note.  Monia Pouch, FNP-C [facility] [address] [place], [place] [address] [phone]

## 2019-09-28 LAB — URINE CULTURE

## 2019-10-02 LAB — ALKALINE PHOSPHATASE, ISOENZYMES
Alkaline Phosphatase: 97 IU/L (ref 39–117)
BONE FRACTION: 29 % (ref 14–68)
INTESTINAL FRAC.: 4 % (ref 0–18)
LIVER FRACTION: 67 % (ref 18–85)

## 2019-10-02 LAB — GAMMA GT: GGT: 41 IU/L (ref 0–60)

## 2019-10-07 ENCOUNTER — Telehealth: Payer: Self-pay | Admitting: Family Medicine

## 2019-10-07 NOTE — Telephone Encounter (Signed)
Pt aware of most recent labs - Liver now normal

## 2019-10-09 ENCOUNTER — Ambulatory Visit: Payer: BC Managed Care – PPO | Attending: Internal Medicine

## 2019-10-09 DIAGNOSIS — Z23 Encounter for immunization: Secondary | ICD-10-CM

## 2019-10-09 NOTE — Progress Notes (Signed)
   Covid-19 Vaccination Clinic  Name:  Sarah Garrett    MRN: [id number] DOB: 04/28/1961  10/09/2019  Ms. Kulczyk was observed post Covid-19 immunization for 30 minutes based on pre-vaccination screening without incident. She was provided with Vaccine Information Sheet and instruction to access the V-Safe system.   Ms. Eigsti was instructed to call 911 with any severe reactions post vaccine: Marland Kitchen Difficulty breathing  . Swelling of face and throat  . A fast heartbeat  . A bad rash all over body  . Dizziness and weakness   Immunizations Administered    Name Date Dose VIS Date Route   Moderna COVID-19 Vaccine 10/09/2019 12:07 PM 0.5 mL 07/01/2019 Intramuscular   Manufacturer: Moderna   Lot: [id number]   [place]:[id number]

## 2019-11-06 DIAGNOSIS — D225 Melanocytic nevi of trunk: Secondary | ICD-10-CM | POA: Diagnosis not present

## 2019-11-06 DIAGNOSIS — L309 Dermatitis, unspecified: Secondary | ICD-10-CM | POA: Diagnosis not present

## 2019-11-06 DIAGNOSIS — L738 Other specified follicular disorders: Secondary | ICD-10-CM | POA: Diagnosis not present

## 2019-11-06 DIAGNOSIS — Z85820 Personal history of malignant melanoma of skin: Secondary | ICD-10-CM | POA: Diagnosis not present

## 2019-11-11 ENCOUNTER — Ambulatory Visit: Payer: BC Managed Care – PPO | Attending: Internal Medicine

## 2019-11-11 DIAGNOSIS — Z23 Encounter for immunization: Secondary | ICD-10-CM

## 2019-11-11 NOTE — Progress Notes (Signed)
   Covid-19 Vaccination Clinic  Name:  Sarah Garrett    MRN: [id number] DOB: 11/12/1960  11/11/2019  Sarah Garrett was observed post Covid-19 immunization for 30 minutes based on pre-vaccination screening without incident. She was provided with Vaccine Information Sheet and instruction to access the V-Safe system.   Sarah Garrett was instructed to call 911 with any severe reactions post vaccine: Marland Kitchen Difficulty breathing  . Swelling of face and throat  . A fast heartbeat  . A bad rash all over body  . Dizziness and weakness   Immunizations Administered    Name Date Dose VIS Date Route   Moderna COVID-19 Vaccine 11/11/2019  9:38 AM 0.5 mL 07/01/2019 Intramuscular   Manufacturer: Moderna   Lot: [id number]   [place]:[id number]

## 2019-11-14 ENCOUNTER — Encounter: Payer: Self-pay | Admitting: *Deleted

## 2019-12-16 ENCOUNTER — Other Ambulatory Visit: Payer: Self-pay

## 2019-12-16 MED ORDER — NAPROXEN 500 MG PO TABS: 500.0000 mg | ORAL_TABLET | Freq: Two times a day (BID) | ORAL | 1 refills | Status: DC

## 2020-01-13 DIAGNOSIS — M25561 Pain in right knee: Secondary | ICD-10-CM | POA: Diagnosis not present

## 2020-03-11 ENCOUNTER — Telehealth: Payer: Self-pay | Admitting: Family Medicine

## 2020-03-11 DIAGNOSIS — I10 Essential (primary) hypertension: Secondary | ICD-10-CM

## 2020-03-11 DIAGNOSIS — E7800 Pure hypercholesterolemia, unspecified: Secondary | ICD-10-CM

## 2020-03-11 DIAGNOSIS — E032 Hypothyroidism due to medicaments and other exogenous substances: Secondary | ICD-10-CM

## 2020-03-11 NOTE — Telephone Encounter (Signed)
Future orders placed for patient to come in to have lab work before appointment.  Patient informed.

## 2020-03-15 ENCOUNTER — Ambulatory Visit (INDEPENDENT_AMBULATORY_CARE_PROVIDER_SITE_OTHER): Payer: BC Managed Care – PPO | Admitting: Family

## 2020-03-15 ENCOUNTER — Encounter: Payer: Self-pay | Admitting: Family

## 2020-03-15 DIAGNOSIS — R1030 Lower abdominal pain, unspecified: Secondary | ICD-10-CM

## 2020-03-15 MED ORDER — METRONIDAZOLE 500 MG PO TABS: 500.0000 mg | ORAL_TABLET | Freq: Three times a day (TID) | ORAL | 0 refills | Status: DC

## 2020-03-15 MED ORDER — LEVOFLOXACIN 500 MG PO TABS: 500.0000 mg | ORAL_TABLET | Freq: Every day | ORAL | 0 refills | Status: DC

## 2020-03-15 NOTE — Progress Notes (Signed)
Virtual Visit via telephone Note Due to COVID-19 pandemic this visit was conducted virtually. This visit type was conducted due to national recommendations for restrictions regarding the COVID-19 Pandemic (e.g. social distancing, sheltering in place) in an effort to limit this patient's exposure and mitigate transmission in our community. All issues noted in this document were discussed and addressed.  A physical exam was not performed with this format.  I connected with Sarah Garrett on 03/15/20 at 11:59 Am by telephone and verified that I am speaking with the correct person using two identifiers. Sarah Garrett is currently located at work and no one is currently with her during visit. The provider, Evelina Dun, FNP is located in their office at time of visit.  I discussed the limitations, risks, security and privacy concerns of performing an evaluation and management service by telephone and the availability of in person appointments. I also discussed with the patient that there may be a patient responsible charge related to this service. The patient expressed understanding and agreed to proceed.   History and Present Illness:  Pt calls the office today with lower abdominal pain that started two days ago. She reports she has had diverticulitis   several times in the past and this feels the exact same way.  Abdominal Pain This is a new problem. The current episode started in the past 7 days. The onset quality is gradual. The problem occurs intermittently. The pain is located in the LLQ and RLQ. The pain is at a severity of 9/10. The pain is moderate. The quality of the pain is aching, cramping and sharp. The abdominal pain does not radiate. Associated symptoms include diarrhea and nausea. Pertinent negatives include no belching, constipation, dysuria, fever, flatus, frequency, hematuria or vomiting. Nothing aggravates the pain. She has tried nothing for the symptoms. The treatment provided  no relief.      Review of Systems  Constitutional: Negative for fever.  Gastrointestinal: Positive for abdominal pain, diarrhea and nausea. Negative for constipation, flatus and vomiting.  Genitourinary: Negative for dysuria, frequency and hematuria.  All other systems reviewed and are negative.    Observations/Objective: No SOB or distress noted  Assessment and Plan: 1. Lower abdominal pain Long discussion she needs a CT scan, pt does not want go get a CT at this time because last time she had one and it diverticulitis and after starting antibiotics her pain improved.  Discussed if her pain worsen to go to ED Will start Levaquin and flagyl today. She states cipro makes her sick.  CBC pending  - CBC with Differential/Platelet - levofloxacin (LEVAQUIN) 500 MG tablet; Take 1 tablet (500 mg total) by mouth daily.  Dispense: 7 tablet; Refill: 0 - metroNIDAZOLE (FLAGYL) 500 MG tablet; Take 1 tablet (500 mg total) by mouth 3 (three) times daily.  Dispense: 21 tablet; Refill: 0    I discussed the assessment and treatment plan with the patient. The patient was provided an opportunity to ask questions and all were answered. The patient agreed with the plan and demonstrated an understanding of the instructions.   The patient was advised to call back or seek an in-person evaluation if the symptoms worsen or if the condition fails to improve as anticipated.  The above assessment and management plan was discussed with the patient. The patient verbalized understanding of and has agreed to the management plan. Patient is aware to call the clinic if symptoms persist or worsen. Patient is aware when to return to the  clinic for a follow-up visit. Patient educated on when it is appropriate to go to the emergency department.   Time call ended: 12:19 pm    I provided 20 minutes of non-face-to-face time during this encounter.    Evelina Dun, FNP

## 2020-03-16 DIAGNOSIS — Z6831 Body mass index (BMI) 31.0-31.9, adult: Secondary | ICD-10-CM | POA: Diagnosis not present

## 2020-03-16 DIAGNOSIS — R05 Cough: Secondary | ICD-10-CM | POA: Diagnosis not present

## 2020-03-16 LAB — CBC WITH DIFFERENTIAL/PLATELET
Basophils Absolute: 0.1 10*3/uL (ref 0.0–0.2)
Basos: 1 %
EOS (ABSOLUTE): 0.1 10*3/uL (ref 0.0–0.4)
Eos: 1 %
Hematocrit: 44.7 % (ref 34.0–46.6)
Hemoglobin: 14.6 g/dL (ref 11.1–15.9)
Immature Grans (Abs): 0 10*3/uL (ref 0.0–0.1)
Immature Granulocytes: 0 %
Lymphocytes Absolute: 1.8 10*3/uL (ref 0.7–3.1)
Lymphs: 17 %
MCH: 28.5 pg (ref 26.6–33.0)
MCHC: 32.7 g/dL (ref 31.5–35.7)
MCV: 87 fL (ref 79–97)
Monocytes Absolute: 0.6 10*3/uL (ref 0.1–0.9)
Monocytes: 6 %
Neutrophils Absolute: 8.3 10*3/uL — ABNORMAL HIGH (ref 1.4–7.0)
Neutrophils: 75 %
Platelets: 167 10*3/uL (ref 150–450)
RBC: 5.13 x10E6/uL (ref 3.77–5.28)
RDW: 15.2 % (ref 11.7–15.4)
WBC: 10.9 10*3/uL — ABNORMAL HIGH (ref 3.4–10.8)

## 2020-03-18 DIAGNOSIS — M479 Spondylosis, unspecified: Secondary | ICD-10-CM | POA: Diagnosis not present

## 2020-03-18 DIAGNOSIS — M545 Low back pain: Secondary | ICD-10-CM | POA: Diagnosis not present

## 2020-03-22 ENCOUNTER — Other Ambulatory Visit: Payer: Self-pay

## 2020-03-22 ENCOUNTER — Emergency Department (HOSPITAL_COMMUNITY): Payer: BC Managed Care – PPO

## 2020-03-22 ENCOUNTER — Inpatient Hospital Stay (HOSPITAL_COMMUNITY)
Admission: EM | Admit: 2020-03-22 | Discharge: 2020-03-26 | DRG: 392 | Disposition: A | Discharge status: Home / Self Care | Payer: BC Managed Care – PPO | Attending: Internal Medicine | Admitting: Internal Medicine

## 2020-03-22 ENCOUNTER — Encounter (HOSPITAL_COMMUNITY): Payer: Self-pay | Admitting: Emergency Medicine

## 2020-03-22 DIAGNOSIS — K5780 Diverticulitis of intestine, part unspecified, with perforation and abscess without bleeding: Secondary | ICD-10-CM | POA: Diagnosis present

## 2020-03-22 DIAGNOSIS — Z20822 Contact with and (suspected) exposure to covid-19: Secondary | ICD-10-CM | POA: Diagnosis not present

## 2020-03-22 DIAGNOSIS — Z85820 Personal history of malignant melanoma of skin: Secondary | ICD-10-CM | POA: Diagnosis not present

## 2020-03-22 DIAGNOSIS — K631 Perforation of intestine (nontraumatic): Secondary | ICD-10-CM | POA: Diagnosis not present

## 2020-03-22 DIAGNOSIS — E039 Hypothyroidism, unspecified: Secondary | ICD-10-CM | POA: Diagnosis not present

## 2020-03-22 DIAGNOSIS — Z79890 Hormone replacement therapy (postmenopausal): Secondary | ICD-10-CM | POA: Diagnosis not present

## 2020-03-22 DIAGNOSIS — D7389 Other diseases of spleen: Secondary | ICD-10-CM | POA: Diagnosis not present

## 2020-03-22 DIAGNOSIS — E876 Hypokalemia: Secondary | ICD-10-CM | POA: Diagnosis not present

## 2020-03-22 DIAGNOSIS — E785 Hyperlipidemia, unspecified: Secondary | ICD-10-CM | POA: Diagnosis not present

## 2020-03-22 DIAGNOSIS — Z79899 Other long term (current) drug therapy: Secondary | ICD-10-CM

## 2020-03-22 DIAGNOSIS — I10 Essential (primary) hypertension: Secondary | ICD-10-CM | POA: Diagnosis not present

## 2020-03-22 DIAGNOSIS — K219 Gastro-esophageal reflux disease without esophagitis: Secondary | ICD-10-CM | POA: Diagnosis not present

## 2020-03-22 DIAGNOSIS — Z87891 Personal history of nicotine dependence: Secondary | ICD-10-CM

## 2020-03-22 DIAGNOSIS — K5720 Diverticulitis of large intestine with perforation and abscess without bleeding: Secondary | ICD-10-CM | POA: Diagnosis not present

## 2020-03-22 DIAGNOSIS — Z8249 Family history of ischemic heart disease and other diseases of the circulatory system: Secondary | ICD-10-CM | POA: Diagnosis not present

## 2020-03-22 DIAGNOSIS — K5792 Diverticulitis of intestine, part unspecified, without perforation or abscess without bleeding: Secondary | ICD-10-CM | POA: Diagnosis not present

## 2020-03-22 DIAGNOSIS — J45909 Unspecified asthma, uncomplicated: Secondary | ICD-10-CM | POA: Diagnosis present

## 2020-03-22 DIAGNOSIS — K5730 Diverticulosis of large intestine without perforation or abscess without bleeding: Secondary | ICD-10-CM | POA: Diagnosis not present

## 2020-03-22 DIAGNOSIS — I700 Atherosclerosis of aorta: Secondary | ICD-10-CM | POA: Diagnosis not present

## 2020-03-22 DIAGNOSIS — D72829 Elevated white blood cell count, unspecified: Secondary | ICD-10-CM | POA: Diagnosis not present

## 2020-03-22 LAB — CBC WITH DIFFERENTIAL/PLATELET
Abs Immature Granulocytes: 0.1 10*3/uL — ABNORMAL HIGH (ref 0.00–0.07)
Basophils Absolute: 0.1 10*3/uL (ref 0.0–0.1)
Basophils Relative: 1 %
Eosinophils Absolute: 0.1 10*3/uL (ref 0.0–0.5)
Eosinophils Relative: 1 %
HCT: 46.2 % — ABNORMAL HIGH (ref 36.0–46.0)
Hemoglobin: 15.7 g/dL — ABNORMAL HIGH (ref 12.0–15.0)
Immature Granulocytes: 1 %
Lymphocytes Relative: 17 %
Lymphs Abs: 2.8 10*3/uL (ref 0.7–4.0)
MCH: 29.6 pg (ref 26.0–34.0)
MCHC: 34 g/dL (ref 30.0–36.0)
MCV: 87.2 fL (ref 80.0–100.0)
Monocytes Absolute: 0.9 10*3/uL (ref 0.1–1.0)
Monocytes Relative: 5 %
Neutro Abs: 12.6 10*3/uL — ABNORMAL HIGH (ref 1.7–7.7)
Neutrophils Relative %: 75 %
Platelets: 213 10*3/uL (ref 150–400)
RBC: 5.3 MIL/uL — ABNORMAL HIGH (ref 3.87–5.11)
RDW: 14.1 % (ref 11.5–15.5)
WBC: 16.6 10*3/uL — ABNORMAL HIGH (ref 4.0–10.5)
nRBC: 0 % (ref 0.0–0.2)

## 2020-03-22 LAB — COMPREHENSIVE METABOLIC PANEL
ALT: 26 U/L (ref 0–44)
AST: 27 U/L (ref 15–41)
Albumin: 4.2 g/dL (ref 3.5–5.0)
Alkaline Phosphatase: 68 U/L (ref 38–126)
Anion gap: 13 (ref 5–15)
BUN: 13 mg/dL (ref 8–23)
CO2: 28 mmol/L (ref 22–32)
Calcium: 9.5 mg/dL (ref 8.9–10.3)
Chloride: 94 mmol/L — ABNORMAL LOW (ref 98–111)
Creatinine, Ser: 1.05 mg/dL — ABNORMAL HIGH (ref 0.44–1.00)
GFR calc Af Amer: >60 mL/min (ref >60)
GFR calc non Af Amer: 57 mL/min — ABNORMAL LOW (ref >60)
Glucose, Bld: 104 mg/dL — ABNORMAL HIGH (ref 70–99)
Potassium: 2.6 mmol/L — CL (ref 3.5–5.1)
Sodium: 135 mmol/L (ref 135–145)
Total Bilirubin: 0.8 mg/dL (ref 0.3–1.2)
Total Protein: 7.8 g/dL (ref 6.5–8.1)

## 2020-03-22 LAB — URINALYSIS, ROUTINE W REFLEX MICROSCOPIC
Bilirubin Urine: NEGATIVE
Glucose, UA: NEGATIVE mg/dL
Hgb urine dipstick: NEGATIVE
Ketones, ur: NEGATIVE mg/dL
Nitrite: POSITIVE — AB
Protein, ur: NEGATIVE mg/dL
Specific Gravity, Urine: 1.01 (ref 1.005–1.030)
pH: 5.5 (ref 5.0–8.0)

## 2020-03-22 LAB — URINALYSIS, MICROSCOPIC (REFLEX): RBC / HPF: NONE SEEN RBC/hpf (ref 0–5)

## 2020-03-22 LAB — SARS CORONAVIRUS 2 BY RT PCR (HOSPITAL ORDER, PERFORMED IN ~~LOC~~ HOSPITAL LAB): SARS Coronavirus 2: NEGATIVE

## 2020-03-22 MED ORDER — MOMETASONE FURO-FORMOTEROL FUM 100-5 MCG/ACT IN AERO
2.0000 | INHALATION_SPRAY | Freq: Two times a day (BID) | RESPIRATORY_TRACT | Status: DC
  Administered 2020-03-22 – 2020-03-26 (×8): 2 via RESPIRATORY_TRACT

## 2020-03-22 MED ORDER — MONTELUKAST SODIUM 10 MG PO TABS
10.0000 mg | ORAL_TABLET | Freq: Every day | ORAL | Status: DC
  Administered 2020-03-22 – 2020-03-25 (×4): 10 mg via ORAL
  Filled 2020-03-22 (×4): qty 1

## 2020-03-22 MED ORDER — PANTOPRAZOLE SODIUM 40 MG PO TBEC
40.0000 mg | DELAYED_RELEASE_TABLET | Freq: Every day | ORAL | Status: DC
  Administered 2020-03-23 – 2020-03-26 (×4): 40 mg via ORAL
  Filled 2020-03-22 (×4): qty 1

## 2020-03-22 MED ORDER — ONDANSETRON HCL 4 MG PO TABS: 4.0000 mg | ORAL_TABLET | Freq: Four times a day (QID) | ORAL | Status: DC | PRN

## 2020-03-22 MED ORDER — POTASSIUM CHLORIDE 10 MEQ/100ML IV SOLN
10.0000 meq | INTRAVENOUS | Status: AC
  Administered 2020-03-22 (×4): 10 meq via INTRAVENOUS
  Filled 2020-03-22 (×4): qty 100

## 2020-03-22 MED ORDER — ACETAMINOPHEN 650 MG RE SUPP: 650.0000 mg | Freq: Four times a day (QID) | RECTAL | Status: DC | PRN

## 2020-03-22 MED ORDER — ONDANSETRON HCL 4 MG/2ML IJ SOLN
4.0000 mg | Freq: Once | INTRAMUSCULAR | Status: AC
  Administered 2020-03-22: 4 mg via INTRAVENOUS
  Filled 2020-03-22: qty 2

## 2020-03-22 MED ORDER — ENOXAPARIN SODIUM 40 MG/0.4ML ~~LOC~~ SOLN
40.0000 mg | SUBCUTANEOUS | Status: DC
  Administered 2020-03-22 – 2020-03-25 (×4): 40 mg via SUBCUTANEOUS
  Filled 2020-03-22 (×4): qty 0.4

## 2020-03-22 MED ORDER — HYDROMORPHONE HCL 1 MG/ML IJ SOLN
0.5000 mg | INTRAMUSCULAR | Status: DC | PRN
  Administered 2020-03-22: 1 mg via INTRAVENOUS
  Administered 2020-03-23: 0.5 mg via INTRAVENOUS
  Filled 2020-03-22 (×2): qty 1

## 2020-03-22 MED ORDER — ACETAMINOPHEN 325 MG PO TABS
650.0000 mg | ORAL_TABLET | Freq: Four times a day (QID) | ORAL | Status: DC | PRN
  Administered 2020-03-22 – 2020-03-25 (×3): 650 mg via ORAL
  Filled 2020-03-22 (×3): qty 2

## 2020-03-22 MED ORDER — IOHEXOL 9 MG/ML PO SOLN
ORAL | Status: AC
  Filled 2020-03-22: qty 1000

## 2020-03-22 MED ORDER — FAMOTIDINE 20 MG PO TABS
20.0000 mg | ORAL_TABLET | Freq: Two times a day (BID) | ORAL | Status: DC
  Administered 2020-03-22 – 2020-03-26 (×8): 20 mg via ORAL
  Filled 2020-03-22 (×8): qty 1

## 2020-03-22 MED ORDER — METHOCARBAMOL 500 MG PO TABS
500.0000 mg | ORAL_TABLET | Freq: Three times a day (TID) | ORAL | Status: DC | PRN
  Administered 2020-03-22 – 2020-03-23 (×2): 500 mg via ORAL
  Filled 2020-03-22 (×2): qty 1

## 2020-03-22 MED ORDER — METOCLOPRAMIDE HCL 5 MG/ML IJ SOLN
10.0000 mg | Freq: Once | INTRAMUSCULAR | Status: AC
  Administered 2020-03-22: 10 mg via INTRAVENOUS
  Filled 2020-03-22: qty 2

## 2020-03-22 MED ORDER — LEVOTHYROXINE SODIUM 25 MCG PO TABS
125.0000 ug | ORAL_TABLET | Freq: Every day | ORAL | Status: DC
  Administered 2020-03-23 – 2020-03-26 (×4): 125 ug via ORAL
  Filled 2020-03-22 (×4): qty 1

## 2020-03-22 MED ORDER — MOMETASONE FURO-FORMOTEROL FUM 100-5 MCG/ACT IN AERO
INHALATION_SPRAY | RESPIRATORY_TRACT | Status: AC
  Filled 2020-03-22: qty 8.8

## 2020-03-22 MED ORDER — HYDROMORPHONE HCL 1 MG/ML IJ SOLN
0.5000 mg | Freq: Once | INTRAMUSCULAR | Status: AC
  Administered 2020-03-22: 0.5 mg via INTRAVENOUS
  Filled 2020-03-22: qty 1

## 2020-03-22 MED ORDER — MAGNESIUM SULFATE 2 GM/50ML IV SOLN
2.0000 g | INTRAVENOUS | Status: AC
  Administered 2020-03-22: 2 g via INTRAVENOUS
  Filled 2020-03-22: qty 50

## 2020-03-22 MED ORDER — IOHEXOL 300 MG/ML  SOLN
100.0000 mL | Freq: Once | INTRAMUSCULAR | Status: AC | PRN
  Administered 2020-03-22: 100 mL via INTRAVENOUS

## 2020-03-22 MED ORDER — POTASSIUM CHLORIDE 10 MEQ/100ML IV SOLN
10.0000 meq | Freq: Once | INTRAVENOUS | Status: AC
  Administered 2020-03-22: 10 meq via INTRAVENOUS
  Filled 2020-03-22: qty 100

## 2020-03-22 MED ORDER — PIPERACILLIN-TAZOBACTAM 3.375 G IVPB 30 MIN
3.3750 g | Freq: Once | INTRAVENOUS | Status: AC
  Administered 2020-03-22: 3.375 g via INTRAVENOUS
  Filled 2020-03-22: qty 50

## 2020-03-22 MED ORDER — FENTANYL CITRATE (PF) 100 MCG/2ML IJ SOLN
50.0000 ug | Freq: Once | INTRAMUSCULAR | Status: AC
  Administered 2020-03-22: 50 ug via INTRAVENOUS
  Filled 2020-03-22: qty 2

## 2020-03-22 MED ORDER — POTASSIUM CHLORIDE 2 MEQ/ML IV SOLN
INTRAVENOUS | Status: DC
  Filled 2020-03-22 (×4): qty 1000

## 2020-03-22 MED ORDER — PIPERACILLIN-TAZOBACTAM 3.375 G IVPB
3.3750 g | Freq: Three times a day (TID) | INTRAVENOUS | Status: DC
  Administered 2020-03-22 – 2020-03-26 (×12): 3.375 g via INTRAVENOUS
  Filled 2020-03-22 (×12): qty 50

## 2020-03-22 MED ORDER — ONDANSETRON HCL 4 MG/2ML IJ SOLN
4.0000 mg | Freq: Four times a day (QID) | INTRAMUSCULAR | Status: DC | PRN
  Administered 2020-03-23: 4 mg via INTRAVENOUS
  Filled 2020-03-22: qty 2

## 2020-03-22 MED ORDER — SODIUM CHLORIDE 0.9 % IV SOLN: INTRAVENOUS | Status: DC | PRN

## 2020-03-22 NOTE — ED Notes (Signed)
Date and time results received: 03/22/20 6:56 AM (use smartphrase ".now" to insert current time)  Test: K+ Critical Value: 2.6  Name of Provider Notified: pickering  Orders Received? Or Actions Taken?:

## 2020-03-22 NOTE — ED Provider Notes (Signed)
This patient has a perforated diverticula, she has been given antibiotics, I consulted with general surgery who will see the patient in consultation, the patient will likely need interventional radiology for possible drainage, there is a 3 cm contained abscess.   Noemi Chapel, MD 03/22/20 1022

## 2020-03-22 NOTE — Progress Notes (Signed)
Pharmacy Antibiotic Note  Sarah Garrett is a 59 y.o. female admitted on 03/22/2020 with intra-abdominal infection.  Pharmacy has been consulted for zosyn dosing.  Plan: Zosyn 3.375g IV q8h (4 hour infusion).  F/U cxs and clinical progress Monitor V/S, labs  Height: 5\' 3"  (160 cm) Weight: 81.6 kg (180 lb) IBW/kg (Calculated) : 52.4  Temp (24hrs), Avg:98.2 F (36.8 C), Min:98.2 F (36.8 C), Max:98.2 F (36.8 C)  Recent Labs  Lab 03/22/20 0631  WBC 16.6*  CREATININE 1.05*    Estimated Creatinine Clearance: 56.9 mL/min (A) (by C-G formula based on SCr of 1.05 mg/dL (H)).    Allergies  Allergen Reactions  . Aspirin Swelling    Swelling of mouth and throat  . Ace Inhibitors Other (See Comments)    Per [id number]  . Actos [Pioglitazone Hydrochloride] Other (See Comments)    Per pt: unknown  . Alpha-Gal Other (See Comments)  . Ciprofloxacin Other (See Comments)    Unknown reaction  . Crestor [Rosuvastatin Calcium] Other (See Comments)    Per pt: unknown  . Pneumococcal Vaccines Rash  . Sulfonamide Derivatives Other (See Comments)    Per pt: unknown    Antimicrobials this admission: Zosyn 8/23>>   Dose adjustments this admission: prn  Microbiology results: 8/23 SARs-2 CV is negative  Thank you for allowing pharmacy to be a part of this patient's care.  Isac Sarna, BS [place], [place] Clinical Pharmacist Pager [phone] 03/22/2020 2:11 PM

## 2020-03-22 NOTE — ED Notes (Signed)
Pt vomited 800ml  

## 2020-03-22 NOTE — H&P (Signed)
History and Physical    Sarah Garrett [id number] DOB: 03/05/1961 DOA: 03/22/2020  PCP: Claretta Fraise, MD  Patient coming from: Home  I have personally briefly reviewed patient's old medical records in [place]  Chief Complaint: Abdominal pain  HPI: Sarah Garrett is a 59 y.o. female with medical history significant of hypertension, hypothyroidism, GERD, asthma, developed lower abdominal pain for the past week.  She has seen her primary care physician and there was concerns of developing diverticulitis.  She was prescribed a course of levofloxacin and Flagyl and has been taking that for the past 7 days.  Despite taking antibiotics, her abdominal pain has persisted.  It is in her lower abdomen and radiates to her lower back.  She had associated nausea and vomiting.  She is unable to keep anything down.  She has not had any diarrhea.  She is not aware of any fevers.  No dysuria.  No shortness of breath.  She has a chronic cough.  ED Course: Upon evaluation in the emergency room, she is noted to have elevated WBC count of 16,000.  Potassium is 2.6.  CT abdomen shows sigmoid diverticulitis with microperforation and associated abscess.  She has been referred for admission.  Review of Systems:  Review of Systems  Constitutional: Negative for chills and fever.  Eyes: Negative for blurred vision and double vision.  Respiratory: Positive for cough. Negative for shortness of breath.   Cardiovascular: Negative for chest pain.  Gastrointestinal: Positive for abdominal pain, nausea and vomiting. Negative for blood in stool and diarrhea.  Genitourinary: Negative for dysuria.  Neurological: Negative for dizziness.      Past Medical History:  Diagnosis Date  . Anemia   . Diverticulosis   . GERD (gastroesophageal reflux disease)   . H/O adenoidectomy   . Hemochromatosis 07/07/2011  . Hyperlipidemia   . Hypertension   . Melanoma ([place])    right shoulder  . Thyroid disease      Past Surgical History:  Procedure Laterality Date  . BREAST BIOPSY  2004   left; benign  . MELANOMA EXCISION  09/28/11   right shoulder  . TONSILLECTOMY AND ADENOIDECTOMY      Social History:  reports that she quit smoking about 42 years ago. Her smoking use included cigarettes. She started smoking about 45 years ago. She has a 0.75 pack-year smoking history. She has never used smokeless tobacco. She reports that she does not drink alcohol and does not use drugs.  Allergies  Allergen Reactions  . Aspirin Swelling    Swelling of mouth and throat  . Ace Inhibitors Other (See Comments)    Per [id number]  . Actos [Pioglitazone Hydrochloride] Other (See Comments)    Per pt: unknown  . Alpha-Gal Other (See Comments)  . Ciprofloxacin Other (See Comments)    Unknown reaction  . Crestor [Rosuvastatin Calcium] Other (See Comments)    Per pt: unknown  . Pneumococcal Vaccines Rash  . Sulfonamide Derivatives Other (See Comments)    Per pt: unknown    Family History  Problem Relation Age of Onset  . Heart disease Mother        by pass  . Colon cancer Neg Hx   . Stomach cancer Neg Hx      Prior to Admission medications   Medication Sig Start Date End Date Taking? Authorizing Provider  albuterol (VENTOLIN HFA) 108 (90 Base) MCG/ACT inhaler TAKE 2 PUFFS BY MOUTH EVERY 6 HOURS AS NEEDED 09/26/19  Yes Rakes,  Connye Burkitt, FNP  benzonatate (TESSALON) 100 MG capsule Take 1 capsule by mouth every 6 (six) hours as needed. 03/16/20 03/16/21 Yes [provider]  cetirizine (ZYRTEC) 10 MG tablet Take 10 mg by mouth daily.     Yes [provider]  diclofenac Sodium (VOLTAREN) 1 % GEL Apply 2 g topically 4 (four) times daily. 09/26/19  Yes Rakes, Connye Burkitt, FNP  EPINEPHrine 0.3 mg/0.3 mL IJ SOAJ injection Inject 0.3 mLs (0.3 mg total) into the muscle as needed for anaphylaxis. 03/14/19  Yes Rakes, Connye Burkitt, FNP  esomeprazole (NEXIUM) 40 MG capsule Take 1 capsule (40 mg total) by mouth as  needed. 09/26/19  Yes Rakes, Connye Burkitt, FNP  famotidine (PEPCID) 20 MG tablet Take 1 tablet (20 mg total) by mouth 2 (two) times daily. 09/26/19  Yes Baruch Gouty, FNP  Fluticasone-Salmeterol (ADVAIR DISKUS) 100-50 MCG/DOSE AEPB TAKE 1 PUFF BY MOUTH TWICE A DAY 09/26/19  Yes Rakes, Connye Burkitt, FNP  furosemide (LASIX) 40 MG tablet Take 1 tablet (40 mg total) by mouth daily as needed. 09/26/19  Yes Rakes, Connye Burkitt, FNP  hydrOXYzine (ATARAX/VISTARIL) 50 MG tablet Take 1 tablet (50 mg total) by mouth 3 (three) times daily as needed. 09/26/19  Yes Rakes, Connye Burkitt, FNP  hyoscyamine (LEVSIN SL) 0.125 MG SL tablet Place 1 tablet (0.125 mg total) under the tongue as needed. Place 1-2 tablet under the tongue until it dissolves as needed 08/27/17  Yes Chipper Herb, MD  icosapent Ethyl (VASCEPA) 1 g capsule Take 1 capsule (1 g total) by mouth 2 (two) times daily. 09/26/19  Yes Rakes, Connye Burkitt, FNP  levothyroxine (SYNTHROID) 125 MCG tablet Take 1 tablet (125 mcg total) by mouth daily. 09/26/19  Yes Rakes, Connye Burkitt, FNP  losartan-hydrochlorothiazide (HYZAAR) 100-12.5 MG tablet Take 1 tablet by mouth daily. as directed 09/26/19  Yes Rakes, Connye Burkitt, FNP  methocarbamol (ROBAXIN) 500 MG tablet Take 500 mg by mouth every 8 (eight) hours as needed. 03/18/20  Yes [provider]  montelukast (SINGULAIR) 10 MG tablet TAKE 1 TABLET BY MOUTH EVERYDAY AT BEDTIME 09/26/19  Yes Rakes, Connye Burkitt, FNP  naproxen (NAPROSYN) 500 MG tablet Take 1 tablet (500 mg total) by mouth 2 (two) times daily with a meal. 12/16/19  Yes Stacks, Cletus Gash, MD  ondansetron (ZOFRAN) 4 MG tablet Take 1 tablet by mouth daily as needed. 03/16/20 03/16/21 Yes [provider]  rosuvastatin (CRESTOR) 20 MG tablet TAKE 1 TABLET (20 MG TOTAL) BY MOUTH DAILY. 09/26/19  Yes Rakes, Connye Burkitt, FNP  Vitamin D, Ergocalciferol, (DRISDOL) 1.25 MG (50000 UNIT) CAPS capsule Take 1 capsule (50,000 Units total) by mouth every 7 (seven) days. 09/26/19  Yes Rakes, Connye Burkitt, FNP    potassium chloride (KLOR-CON) 10 MEQ tablet Take 1 tablet (10 mEq total) by mouth daily. Patient not taking: Reported on 03/22/2020 09/26/19   Baruch Gouty, FNP    Physical Exam: Vitals:   03/22/20 0612 03/22/20 0618  BP:  111/79  Pulse:  80  Resp:  18  Temp:  98.2 F (36.8 C)  TempSrc:  Oral  SpO2:  98%  Weight: 81.6 kg   Height: 5\' 3"  (1.6 m)     Constitutional: NAD, calm, comfortable Eyes: PERRL, lids and conjunctivae normal ENMT: Mucous membranes are moist. Posterior pharynx clear of any exudate or lesions.Normal dentition.  Neck: normal, supple, no masses, no thyromegaly Respiratory: clear to auscultation bilaterally, no wheezing, no crackles. Normal respiratory effort. No accessory muscle use.  Cardiovascular: Regular rate and rhythm, no murmurs / rubs / gallops. No extremity edema. 2+ pedal pulses. No carotid bruits.  Abdomen: Soft, tenderness in lower abdomen, no masses palpated. No hepatosplenomegaly. Bowel sounds positive.  Musculoskeletal: no clubbing / cyanosis. No joint deformity upper and lower extremities. Good ROM, no contractures. Normal muscle tone.  Skin: no rashes, lesions, ulcers. No induration Neurologic: CN 2-12 grossly intact. Sensation intact, DTR normal. Strength 5/5 in all 4.  Psychiatric: Normal judgment and insight. Alert and oriented x 3. Normal mood.    Labs on Admission: I have personally reviewed following labs and imaging studies  CBC: Recent Labs  Lab 03/22/20 0631  WBC 16.6*  NEUTROABS 12.6*  HGB 15.7*  HCT 46.2*  MCV 87.2  PLT 001   Basic Metabolic Panel: Recent Labs  Lab 03/22/20 0631  NA 135  K 2.6*  CL 94*  CO2 28  GLUCOSE 104*  BUN 13  CREATININE 1.05*  CALCIUM 9.5   GFR: Estimated Creatinine Clearance: 56.9 mL/min (A) (by C-G formula based on SCr of 1.05 mg/dL (H)). Liver Function Tests: Recent Labs  Lab 03/22/20 0631  AST 27  ALT 26  ALKPHOS 68  BILITOT 0.8  PROT 7.8  ALBUMIN 4.2   No results for  input(s): LIPASE, AMYLASE in the last 168 hours. No results for input(s): AMMONIA in the last 168 hours. Coagulation Profile: No results for input(s): INR, PROTIME in the last 168 hours. Cardiac Enzymes: No results for input(s): CKTOTAL, CKMB, CKMBINDEX, TROPONINI in the last 168 hours. BNP (last 3 results) No results for input(s): PROBNP in the last 8760 hours. HbA1C: No results for input(s): HGBA1C in the last 72 hours. CBG: No results for input(s): GLUCAP in the last 168 hours. Lipid Profile: No results for input(s): CHOL, HDL, LDLCALC, TRIG, CHOLHDL, LDLDIRECT in the last 72 hours. Thyroid Function Tests: No results for input(s): TSH, T4TOTAL, FREET4, T3FREE, THYROIDAB in the last 72 hours. Anemia Panel: No results for input(s): VITAMINB12, FOLATE, FERRITIN, TIBC, IRON, RETICCTPCT in the last 72 hours. Urine analysis:    Component Value Date/Time   COLORURINE YELLOW 03/22/2020 0620   APPEARANCEUR CLEAR 03/22/2020 0620   APPEARANCEUR Clear 10/19/2015 1527   LABSPEC 1.010 03/22/2020 0620   PHURINE 5.5 03/22/2020 0620   GLUCOSEU NEGATIVE 03/22/2020 0620   HGBUR NEGATIVE 03/22/2020 0620   BILIRUBINUR NEGATIVE 03/22/2020 0620   BILIRUBINUR Negative 10/19/2015 1527   KETONESUR NEGATIVE 03/22/2020 0620   PROTEINUR NEGATIVE 03/22/2020 0620   UROBILINOGEN negative 08/22/2013 0945   NITRITE POSITIVE (A) 03/22/2020 0620   LEUKOCYTESUR TRACE (A) 03/22/2020 0620    Radiological Exams on Admission: CT ABDOMEN PELVIS W CONTRAST  Result Date: 03/22/2020 CLINICAL DATA:  Abdominal pain, history of diverticulitis EXAM: CT ABDOMEN AND PELVIS WITH CONTRAST TECHNIQUE: Multidetector CT imaging of the abdomen and pelvis was performed using the standard protocol following bolus administration of intravenous contrast. CONTRAST:  127mL OMNIPAQUE IOHEXOL 300 MG/ML  SOLN COMPARISON:  2015 FINDINGS: Lower chest: No acute abnormality. Hepatobiliary: No focal liver abnormality is seen. No gallstones,  gallbladder wall thickening, or biliary dilatation. Pancreas: Unremarkable. Spleen: Similar nonspecific small low-density lesion. Otherwise unremarkable. Adrenals/Urinary Tract: Adrenals, kidneys, and bladder are unremarkable. Stomach/Bowel: Bowel is normal in caliber. There is extensive distal colonic diverticulosis. There are inflammatory changes about the sigmoid colon with an adjacent air and fluid containing collection measuring approximately 2.5 x 2.9 x 1.6 cm. Small hiatal hernia. Normal appendix. Vascular/Lymphatic: Minimal aortic atherosclerosis. No enlarged lymph nodes identified. Reproductive:  Anteverted uterus. Above inflammatory changes are along the dorsal aspect of the uterine body. No adnexal mass. Other: Abdominal wall is unremarkable. Musculoskeletal: No acute osseous abnormality. IMPRESSION: Acute sigmoid diverticulitis with microperforation and small abscess formation between the sigmoid and uterus. These results were called by telephone at the time of interpretation on 03/22/2020 at 8:57 am to provider Dr. Sabra Heck, who verbally acknowledged these results. Electronically Signed   By: Macy Mis M.D.   On: 03/22/2020 08:58     Assessment/Plan Active Problems:   Essential hypertension   Asthma   Diverticulitis of intestine with perforation and abscess   Hypokalemia     1. Acute diverticulitis with microperforation and abscess.  She was started on intravenous antibiotics.  General surgery consulted.  May need IR drain placement pending how her condition evolves.  Start on clear liquid diet for now.  Continue pain management.  Antiemetics as needed. 2. Hypokalemia.  Secondary to persistent vomiting as well as HCTZ use.  Replace.  Check magnesium. 3. Hypertension.  On losartan/hydrochlorothiazide.  Will hold for now.  Monitor blood pressure. 4. Asthma.  Continue on steroid inhalers.  Bronchodilators as needed.  No wheezing at this time. 5. Hypothyroidism.  Continue  Synthroid 6. GERD.  Continue on PPI and H2 blockers.  DVT prophylaxis: Lovenox Code Status: Full code Family Communication: Discussed with husband at the bedside Disposition Plan: Discharge home once abdominal pain has improved Consults called: General surgery Admission status: Inpatient, MedSurg  Severity of Illness: The appropriate patient status for this patient is INPATIENT. Inpatient status is judged to be reasonable and necessary in order to provide the required intensity of service to ensure the patient's safety. The patient's presenting symptoms, physical exam findings, and initial radiographic and laboratory data in the context of their chronic comorbidities is felt to place them at high risk for further clinical deterioration. Furthermore, it is not anticipated that the patient will be medically stable for discharge from the hospital within 2 midnights of admission. The following factors support the patient status of inpatient.    "           The patient's presenting symptoms include  abdominal pain, vomiting, generalized weakness, decreased p.o. intake "           The worrisome physical exam findings include  abdominal tenderness, tachycardia "           The initial radiographic and laboratory data are worrisome because of  diverticulitis with microperforation and abscess on CT abdomen, hypokalemia, leukocytosis "           The chronic co-morbidities include  hypertension, asthma, hypothyroidism     * I certify that at the point of admission it is my clinical judgment that the patient will require inpatient hospital care spanning beyond 2 midnights from the point of admission due to high intensity of service, high risk for further deterioration and high frequency of surveillance required.Kathie Dike MD [facility]   If 7PM-7AM, please contact night-coverage [email or web address]   03/22/2020, 2:07 PM

## 2020-03-22 NOTE — ED Triage Notes (Signed)
Patient states abdominal pain x 1 week with vomiting. Patient was diagnosed with diverticulitis and prescribed flagyl and levaquin. Patient states that she started to feel better and then pain started to become worse. Patient states that she has been unable to eat.

## 2020-03-22 NOTE — Consult Note (Signed)
[facility] Consult  Reason for Consult: Diverticulitis with contained perforation  Referring Physician:  Dr. Roderic Palau   Chief Complaint    Abdominal Pain      HPI: Sarah Garrett is a 59 y.o. female with prior history of diverticulitis treated as an outpatient with antibiotics but confirmed on CT. She says that she developed a few day history of lower abdominal pain that had been proceeded by some back pain last week. She says she has had some nausea and vomiting. No fevers. Her stools have remained regular but are looser.  She denies any rectal bleeding. She describes the pain and sharp and constant at this time.She has had some improvement with the pain medication.  She was prescribed some Cipro and flagyl by her PCP and this did not help this past week.   Her last colonoscopy was in 2013 per documentation and was negative with plans for repeat in 2023.    She reports having a cough recently but having Negative COVID test prior to admission and on admission.   Past Medical History:  Diagnosis Date  . Anemia   . Diverticulosis   . GERD (gastroesophageal reflux disease)   . H/O adenoidectomy   . Hemochromatosis 07/07/2011  . Hyperlipidemia   . Hypertension   . Melanoma ([place])    right shoulder  . Thyroid disease     Past Surgical History:  Procedure Laterality Date  . BREAST BIOPSY  2004   left; benign  . MELANOMA EXCISION  09/28/11   right shoulder  . TONSILLECTOMY AND ADENOIDECTOMY      Family History  Problem Relation Age of Onset  . Heart disease Mother        by pass  . Colon cancer Neg Hx   . Stomach cancer Neg Hx     Social History   Tobacco Use  . Smoking status: Former Smoker    Packs/day: 0.25    Years: 3.00    Pack years: 0.75    Types: Cigarettes    Start date: 08/16/1974    Quit date: 01/14/1978    Years since quitting: 42.2  . Smokeless tobacco: Never Used  . Tobacco comment: quit 37 years ago  Vaping Use  . Vaping Use: Never  used  Substance Use Topics  . Alcohol use: No    Alcohol/week: 0.0 standard drinks  . Drug use: No    Medications: I have reviewed the patient's current medications. Current Facility-Administered Medications  Medication Dose Route Frequency Provider Last Rate Last Admin  . 0.9 %  sodium chloride infusion   Intravenous PRN Kathie Dike, MD      . acetaminophen (TYLENOL) tablet 650 mg  650 mg Oral Q6H PRN Kathie Dike, MD       Or  . acetaminophen (TYLENOL) suppository 650 mg  650 mg Rectal Q6H PRN Kathie Dike, MD      . enoxaparin (LOVENOX) injection 40 mg  40 mg Subcutaneous Q24H Memon, Jolaine Artist, MD   40 mg at 03/22/20 1537  . famotidine (PEPCID) tablet 20 mg  20 mg Oral BID Kathie Dike, MD      . HYDROmorphone (DILAUDID) injection 0.5-1 mg  0.5-1 mg Intravenous Q2H PRN Kathie Dike, MD   1 mg at 03/22/20 1421  . iohexol (OMNIPAQUE) 9 MG/ML oral solution           . lactated ringers 1,000 mL with potassium chloride 20 mEq infusion   Intravenous Continuous Kathie Dike, MD      .  levothyroxine (SYNTHROID) tablet 125 mcg  125 mcg Oral Daily Memon, Jolaine Artist, MD      . methocarbamol (ROBAXIN) tablet 500 mg  500 mg Oral Q8H PRN Kathie Dike, MD      . mometasone-formoterol (DULERA) 100-5 MCG/ACT inhaler 2 puff  2 puff Inhalation BID Kathie Dike, MD      . montelukast (SINGULAIR) tablet 10 mg  10 mg Oral QHS Kathie Dike, MD      . ondansetron (ZOFRAN) tablet 4 mg  4 mg Oral Q6H PRN Kathie Dike, MD       Or  . ondansetron (ZOFRAN) injection 4 mg  4 mg Intravenous Q6H PRN Kathie Dike, MD      . pantoprazole (PROTONIX) EC tablet 40 mg  40 mg Oral Daily Memon, Jolaine Artist, MD      . piperacillin-tazobactam (ZOSYN) IVPB 3.375 g  3.375 g Intravenous Q8H Memon, Jolaine Artist, MD 12.5 mL/hr at 03/22/20 1544 3.375 g at 03/22/20 1544    Allergies  Allergen Reactions  . Aspirin Swelling    Swelling of mouth and throat  . Ace Inhibitors Other (See Comments)    Per  JO:ACZYSAY  . Actos [Pioglitazone Hydrochloride] Other (See Comments)    Per pt: unknown  . Alpha-Gal Other (See Comments)  . Ciprofloxacin Other (See Comments)    Unknown reaction  . Crestor [Rosuvastatin Calcium] Other (See Comments)    Per pt: unknown  . Pneumococcal Vaccines Rash  . Sulfonamide Derivatives Other (See Comments)    Per pt: unknown     ROS:  A comprehensive review of systems was negative except for: Respiratory: positive for cough Gastrointestinal: positive for abdominal pain, nausea and vomiting  Blood pressure 114/74, pulse 79, temperature 98.2 F (36.8 C), temperature source Oral, resp. rate 18, height 5\' 3"  (1.6 m), weight 83.9 kg, last menstrual period 11/29/2011, SpO2 98 %. Physical Exam Vitals reviewed.  Constitutional:      Appearance: She is well-developed.  HENT:     Head: Normocephalic.  Eyes:     Extraocular Movements: Extraocular movements intact.  Cardiovascular:     Rate and Rhythm: Normal rate.  Pulmonary:     Effort: Pulmonary effort is normal.  Abdominal:     General: There is no distension.     Palpations: Abdomen is soft.     Tenderness: There is abdominal tenderness in the suprapubic area and left lower quadrant. There is no guarding.  Musculoskeletal:     Comments: Moves all extremities   Skin:    General: Skin is warm.  Neurological:     General: No focal deficit present.     Mental Status: She is alert and oriented to person, place, and time.  Psychiatric:        Mood and Affect: Mood normal.        Behavior: Behavior normal.     Results: Results for orders placed or performed during the hospital encounter of 03/22/20 (from the past 48 hour(s))  Urinalysis, Routine w reflex microscopic Urine, Random     Status: Abnormal   Collection Time: 03/22/20  6:20 AM  Result Value Ref Range   Color, Urine YELLOW YELLOW   APPearance CLEAR CLEAR   Specific Gravity, Urine 1.010 1.005 - 1.030   pH 5.5 5.0 - 8.0   Glucose, UA  NEGATIVE NEGATIVE mg/dL   Hgb urine dipstick NEGATIVE NEGATIVE   Bilirubin Urine NEGATIVE NEGATIVE   Ketones, ur NEGATIVE NEGATIVE mg/dL   Protein, ur NEGATIVE NEGATIVE mg/dL   Nitrite  POSITIVE (A) NEGATIVE   Leukocytes,Ua TRACE (A) NEGATIVE    Comment: Performed at [facility], [address], [place], [place] [address]  Urinalysis, Microscopic (reflex)     Status: Abnormal   Collection Time: 03/22/20  6:20 AM  Result Value Ref Range   RBC / HPF NONE SEEN 0 - 5 RBC/hpf   WBC, UA 0-5 0 - 5 WBC/hpf   Bacteria, UA RARE (A) NONE SEEN   Squamous Epithelial / LPF 0-5 0 - 5    Comment: Performed at [facility], [address], [place], [place] [address]  Comprehensive metabolic panel     Status: Abnormal   Collection Time: 03/22/20  6:31 AM  Result Value Ref Range   Sodium 135 135 - 145 mmol/L   Potassium 2.6 (LL) 3.5 - 5.1 mmol/L    Comment: CRITICAL RESULT CALLED TO, READ BACK BY AND VERIFIED WITH: NICHOLS,K AT 6:50AM ON 03/22/20 BY FESTERMAN,C    Chloride 94 (L) 98 - 111 mmol/L   CO2 28 22 - 32 mmol/L   Glucose, Bld 104 (H) 70 - 99 mg/dL    Comment: Glucose reference range applies only to samples taken after fasting for at least 8 hours.   BUN 13 8 - 23 mg/dL   Creatinine, Ser 1.05 (H) 0.44 - 1.00 mg/dL   Calcium 9.5 8.9 - 10.3 mg/dL   Total Protein 7.8 6.5 - 8.1 g/dL   Albumin 4.2 3.5 - 5.0 g/dL   AST 27 15 - 41 U/L   ALT 26 0 - 44 U/L   Alkaline Phosphatase 68 38 - 126 U/L   Total Bilirubin 0.8 0.3 - 1.2 mg/dL   GFR calc non Af Amer 57 (L) >60 mL/min   GFR calc Af Amer >60 >60 mL/min   Anion gap 13 5 - 15    Comment: Performed at [facility], [address], [place], [place] [address]  CBC with Differential     Status: Abnormal   Collection Time: 03/22/20  6:31 AM  Result Value Ref Range   WBC 16.6 (H) 4.0 - 10.5 K/uL   RBC 5.30 (H) 3.87 - 5.11 MIL/uL   Hemoglobin 15.7 (H) 12.0 - 15.0 g/dL   HCT 46.2 (H) 36 - 46 %   MCV 87.2 80.0 - 100.0 fL   MCH 29.6 26.0 - 34.0 pg    MCHC 34.0 30.0 - 36.0 g/dL   RDW 14.1 11.5 - 15.5 %   Platelets 213 150 - 400 K/uL   nRBC 0.0 0.0 - 0.2 %   Neutrophils Relative % 75 %   Neutro Abs 12.6 (H) 1.7 - 7.7 K/uL   Lymphocytes Relative 17 %   Lymphs Abs 2.8 0.7 - 4.0 K/uL   Monocytes Relative 5 %   Monocytes Absolute 0.9 0 - 1 K/uL   Eosinophils Relative 1 %   Eosinophils Absolute 0.1 0 - 0 K/uL   Basophils Relative 1 %   Basophils Absolute 0.1 0 - 0 K/uL   Immature Granulocytes 1 %   Abs Immature Granulocytes 0.10 (H) 0.00 - 0.07 K/uL    Comment: Performed at [facility], [address] [place],  [address]  SARS Coronavirus 2 by RT PCR (hospital order, performed in [facility] hospital lab) Nasopharyngeal Nasopharyngeal Swab     Status: None   Collection Time: 03/22/20 10:27 AM   Specimen: Nasopharyngeal Swab  Result Value Ref Range   SARS Coronavirus 2 NEGATIVE NEGATIVE    Comment: (NOTE) SARS-CoV-2 target  nucleic acids are NOT DETECTED.  The SARS-CoV-2 RNA is generally detectable in upper and lower respiratory specimens during the acute phase of infection. The lowest concentration of SARS-CoV-2 viral copies this assay can detect is 250 copies / mL. A negative result does not preclude SARS-CoV-2 infection and should not be used as the sole basis for treatment or other patient management decisions.  A negative result may occur with improper specimen collection / handling, submission of specimen other than nasopharyngeal swab, presence of viral mutation(s) within the areas targeted by this assay, and inadequate number of viral copies (<250 copies / mL). A negative result must be combined with clinical observations, patient history, and epidemiological information.  Fact Sheet for Patients:   StrictlyIdeas.no  Fact Sheet for Healthcare Providers: BankingDealers.co.za  This test is not yet approved or  cleared by the [place] FDA and has been authorized  for detection and/or diagnosis of SARS-CoV-2 by FDA under an Emergency Use Authorization (EUA).  This EUA will remain in effect (meaning this test can be used) for the duration of the COVID-19 declaration under Section 564(b)(1) of the Act, 21 U.S.C. section 360bbb-3(b)(1), unless the authorization is terminated or revoked sooner.  Performed at [facility], [address] [place], [place] [address]    Personally reviewed- diverticula throughout left colon and sigmoid diverticulitis with perforation, gas foci but no fluid collection  CT ABDOMEN PELVIS W CONTRAST  Result Date: 03/22/2020 CLINICAL DATA:  Abdominal pain, history of diverticulitis EXAM: CT ABDOMEN AND PELVIS WITH CONTRAST TECHNIQUE: Multidetector CT imaging of the abdomen and pelvis was performed using the standard protocol following bolus administration of intravenous contrast. CONTRAST:  183mL OMNIPAQUE IOHEXOL 300 MG/ML  SOLN COMPARISON:  2015 FINDINGS: Lower chest: No acute abnormality. Hepatobiliary: No focal liver abnormality is seen. No gallstones, gallbladder wall thickening, or biliary dilatation. Pancreas: Unremarkable. Spleen: Similar nonspecific small low-density lesion. Otherwise unremarkable. Adrenals/Urinary Tract: Adrenals, kidneys, and bladder are unremarkable. Stomach/Bowel: Bowel is normal in caliber. There is extensive distal colonic diverticulosis. There are inflammatory changes about the sigmoid colon with an adjacent air and fluid containing collection measuring approximately 2.5 x 2.9 x 1.6 cm. Small hiatal hernia. Normal appendix. Vascular/Lymphatic: Minimal aortic atherosclerosis. No enlarged lymph nodes identified. Reproductive: Anteverted uterus. Above inflammatory changes are along the dorsal aspect of the uterine body. No adnexal mass. Other: Abdominal wall is unremarkable. Musculoskeletal: No acute osseous abnormality. IMPRESSION: Acute sigmoid diverticulitis with microperforation and small abscess formation  between the sigmoid and uterus. These results were called by telephone at the time of interpretation on 03/22/2020 at 8:57 am to provider Dr. Sabra Heck, who verbally acknowledged these results. Electronically Signed   By: Macy Mis M.D.   On: 03/22/2020 08:58     Assessment & Plan:  Sarah Garrett is a 59 y.o. female with diverticulitis with microperforation. No drainable collection at this time.   We have discussed that diverticulitis is a spectrum of disease. We discussed that it ranges from simple diverticulitis that can be treated with oral antibiotics as an outpatient to severe cases with perforations that require emergency surgery and colostomy.  We discussed that there are cases that are intermediate or complicated and require hospitalization for IV antibiotics and sometimes require Interventional Radiology drainage of abscesses or fluid collections that form.  We have discussed that some people still require emergency surgery if their case worsens and that during the acute inflammation and infection period a colostomy. We discussed that most people are able to be  discharged with antibiotics +/- a drain, and we discuss the options of elective colectomy as an outpatient. We also discussed the potential need for a colonoscopy if there is not a recent study performed.    -IV antibiotics, agree with zosyn -Clear diet for now   All questions were answered to the satisfaction of the patient. Discussed with Dr. Roderic Palau.  Virl Cagey 03/22/2020, 5:44 PM

## 2020-03-22 NOTE — ED Provider Notes (Addendum)
[facility] Provider Note   CSN: [id number] Arrival date & time: 03/22/20  0601     History Chief Complaint  Patient presents with  . Abdominal Pain    Sarah Garrett is a 59 y.o. female.  HPI  Patient presents with abdominal pain.  Has had for over a week.  6 days ago patient had a virtual visit with her PCP and was started on Levaquin and Flagyl for presumed diverticulitis.  Has lower abdominal pain.  Had been doing well.  However did develop abdominal pain that worsened last night.  Said nausea vomiting and dry heaves.  Pain is in her lower abdomen does go around to the back.  Had had fevers around a week ago but not now.  No dysuria.  Pain is crampy.     Past Medical History:  Diagnosis Date  . Anemia   . Diverticulosis   . GERD (gastroesophageal reflux disease)   . H/O adenoidectomy   . Hemochromatosis 07/07/2011  . Hyperlipidemia   . Hypertension   . Melanoma ([place])    right shoulder  . Thyroid disease     Patient Active Problem List   Diagnosis Date Noted  . Diverticulitis of intestine with perforation and abscess 03/22/2020  . Hypokalemia 03/22/2020  . Diverticulitis of large intestine with perforation without bleeding   . Morbid obesity ([place]) 09/26/2019  . Allergy to alpha-gal 03/14/2019  . Contact dermatitis 03/24/2013  . Asthma 03/24/2013  . Melanoma of upper arm, right. T1a., History of 09/07/2011  . Hemochromatosis 07/07/2011  . Hypothyroidism 02/17/2008  . Hyperlipidemia 10/11/2007  . ANEMIA-NOS 10/11/2007  . Essential hypertension 10/11/2007  . ALLERGIC RHINITIS 10/11/2007  . BRONCHITIS, CHRONIC 10/11/2007  . GERD 10/11/2007  . ADENOIDECTOMY, HX OF 10/11/2007    Past Surgical History:  Procedure Laterality Date  . BREAST BIOPSY  2004   left; benign  . MELANOMA EXCISION  09/28/11   right shoulder  . TONSILLECTOMY AND ADENOIDECTOMY       OB History   No obstetric history on file.     Family History  Problem  Relation Age of Onset  . Heart disease Mother        by pass  . Colon cancer Neg Hx   . Stomach cancer Neg Hx     Social History   Tobacco Use  . Smoking status: Former Smoker    Packs/day: 0.25    Years: 3.00    Pack years: 0.75    Types: Cigarettes    Start date: 08/16/1974    Quit date: 01/14/1978    Years since quitting: 42.2  . Smokeless tobacco: Never Used  . Tobacco comment: quit 37 years ago  Vaping Use  . Vaping Use: Never used  Substance Use Topics  . Alcohol use: No    Alcohol/week: 0.0 standard drinks  . Drug use: No    Home Medications Prior to Admission medications   Medication Sig Start Date End Date Taking? Authorizing Provider  albuterol (VENTOLIN HFA) 108 (90 Base) MCG/ACT inhaler TAKE 2 PUFFS BY MOUTH EVERY 6 HOURS AS NEEDED 09/26/19  Yes Rakes, Connye Burkitt, FNP  benzonatate (TESSALON) 100 MG capsule Take 1 capsule by mouth every 6 (six) hours as needed. 03/16/20 03/16/21 Yes [provider]  cetirizine (ZYRTEC) 10 MG tablet Take 10 mg by mouth daily.     Yes [provider]  diclofenac Sodium (VOLTAREN) 1 % GEL Apply 2 g topically 4 (four) times daily. 09/26/19  Yes Rakes, Connye Burkitt, FNP  EPINEPHrine 0.3 mg/0.3 mL IJ SOAJ injection Inject 0.3 mLs (0.3 mg total) into the muscle as needed for anaphylaxis. 03/14/19  Yes Rakes, Connye Burkitt, FNP  esomeprazole (NEXIUM) 40 MG capsule Take 1 capsule (40 mg total) by mouth as needed. 09/26/19  Yes Rakes, Connye Burkitt, FNP  famotidine (PEPCID) 20 MG tablet Take 1 tablet (20 mg total) by mouth 2 (two) times daily. 09/26/19  Yes Baruch Gouty, FNP  Fluticasone-Salmeterol (ADVAIR DISKUS) 100-50 MCG/DOSE AEPB TAKE 1 PUFF BY MOUTH TWICE A DAY 09/26/19  Yes Rakes, Connye Burkitt, FNP  furosemide (LASIX) 40 MG tablet Take 1 tablet (40 mg total) by mouth daily as needed. 09/26/19  Yes Rakes, Connye Burkitt, FNP  hydrOXYzine (ATARAX/VISTARIL) 50 MG tablet Take 1 tablet (50 mg total) by mouth 3 (three) times daily as needed. 09/26/19  Yes Rakes,  Connye Burkitt, FNP  hyoscyamine (LEVSIN SL) 0.125 MG SL tablet Place 1 tablet (0.125 mg total) under the tongue as needed. Place 1-2 tablet under the tongue until it dissolves as needed 08/27/17  Yes Chipper Herb, MD  icosapent Ethyl (VASCEPA) 1 g capsule Take 1 capsule (1 g total) by mouth 2 (two) times daily. 09/26/19  Yes Rakes, Connye Burkitt, FNP  levothyroxine (SYNTHROID) 125 MCG tablet Take 1 tablet (125 mcg total) by mouth daily. 09/26/19  Yes Rakes, Connye Burkitt, FNP  losartan-hydrochlorothiazide (HYZAAR) 100-12.5 MG tablet Take 1 tablet by mouth daily. as directed 09/26/19  Yes Rakes, Connye Burkitt, FNP  methocarbamol (ROBAXIN) 500 MG tablet Take 500 mg by mouth every 8 (eight) hours as needed. 03/18/20  Yes [provider]  montelukast (SINGULAIR) 10 MG tablet TAKE 1 TABLET BY MOUTH EVERYDAY AT BEDTIME 09/26/19  Yes Rakes, Connye Burkitt, FNP  rosuvastatin (CRESTOR) 20 MG tablet TAKE 1 TABLET (20 MG TOTAL) BY MOUTH DAILY. 09/26/19  Yes Rakes, Connye Burkitt, FNP  Vitamin D, Ergocalciferol, (DRISDOL) 1.25 MG (50000 UNIT) CAPS capsule Take 1 capsule (50,000 Units total) by mouth every 7 (seven) days. 09/26/19  Yes Rakes, Connye Burkitt, FNP  amoxicillin-clavulanate (AUGMENTIN) 875-125 MG tablet Take 1 tablet by mouth every 12 (twelve) hours for 10 days. 03/26/20 04/05/20  Aline August, MD  ondansetron (ZOFRAN) 4 MG tablet Take 1 tablet (4 mg total) by mouth every 6 (six) hours as needed for nausea. 03/26/20   Aline August, MD  oxyCODONE (OXY IR/ROXICODONE) 5 MG immediate release tablet Take 1 tablet (5 mg total) by mouth every 6 (six) hours as needed for moderate pain. 03/26/20   Aline August, MD    Allergies    Aspirin, Ace inhibitors, Actos [pioglitazone hydrochloride], Alpha-gal, Ciprofloxacin, Crestor [rosuvastatin calcium], Pneumococcal vaccines, and Sulfonamide derivatives  Review of Systems   Review of Systems  Constitutional: Positive for appetite change.  HENT: Negative for congestion.   Cardiovascular: Negative for  chest pain.  Gastrointestinal: Positive for abdominal pain, nausea and vomiting.  Genitourinary: Negative for flank pain.  Musculoskeletal: Negative for back pain.  Neurological: Negative for weakness.  Psychiatric/Behavioral: Negative for confusion.    Physical Exam Updated Vital Signs BP 123/75 (BP Location: Left Arm)   Pulse 69   Temp 98.2 F (36.8 C) (Oral)   Resp 14   Ht 5\' 3"  (1.6 m)   Wt 83.9 kg   LMP 11/29/2011   SpO2 95%   BMI 32.77 kg/m   Physical Exam Vitals and nursing note reviewed.  HENT:     Head: Atraumatic.  Cardiovascular:  Rate and Rhythm: Regular rhythm.  Pulmonary:     Breath sounds: Normal breath sounds.  Abdominal:     Tenderness: There is abdominal tenderness.     Hernia: No hernia is present.     Comments: So lower abdominal tenderness without rebound or guarding.  Skin:    General: Skin is warm.  Neurological:     Mental Status: She is alert.     ED Results / Procedures / Treatments   Labs (all labs ordered are listed, but only abnormal results are displayed) Labs Reviewed  COMPREHENSIVE METABOLIC PANEL - Abnormal; Notable for the following components:      Result Value   Potassium 2.6 (*)    Chloride 94 (*)    Glucose, Bld 104 (*)    Creatinine, Ser 1.05 (*)    GFR calc non Af Amer 57 (*)    All other components within normal limits  CBC WITH DIFFERENTIAL/PLATELET - Abnormal; Notable for the following components:   WBC 16.6 (*)    RBC 5.30 (*)    Hemoglobin 15.7 (*)    HCT 46.2 (*)    Neutro Abs 12.6 (*)    Abs Immature Granulocytes 0.10 (*)    All other components within normal limits  URINALYSIS, ROUTINE W REFLEX MICROSCOPIC - Abnormal; Notable for the following components:   Nitrite POSITIVE (*)    Leukocytes,Ua TRACE (*)    All other components within normal limits  URINALYSIS, MICROSCOPIC (REFLEX) - Abnormal; Notable for the following components:   Bacteria, UA RARE (*)    All other components within normal limits    CBC - Abnormal; Notable for the following components:   WBC 11.8 (*)    All other components within normal limits  COMPREHENSIVE METABOLIC PANEL - Abnormal; Notable for the following components:   Potassium 3.3 (*)    Chloride 97 (*)    Calcium 8.8 (*)    Albumin 3.4 (*)    All other components within normal limits  COMPREHENSIVE METABOLIC PANEL - Abnormal; Notable for the following components:   BUN 6 (*)    Calcium 8.6 (*)    Total Protein 6.0 (*)    Albumin 3.1 (*)    AST 12 (*)    All other components within normal limits  CBC - Abnormal; Notable for the following components:   WBC 11.7 (*)    All other components within normal limits  COMPREHENSIVE METABOLIC PANEL - Abnormal; Notable for the following components:   BUN <5 (*)    Albumin 3.4 (*)    All other components within normal limits  BASIC METABOLIC PANEL - Abnormal; Notable for the following components:   BUN 5 (*)    All other components within normal limits  C-REACTIVE PROTEIN - Abnormal; Notable for the following components:   CRP 2.1 (*)    All other components within normal limits  SARS CORONAVIRUS 2 BY RT PCR (HOSPITAL ORDER, [facility])  HIV ANTIBODY (ROUTINE TESTING W REFLEX)  MAGNESIUM  CBC  MAGNESIUM  CBC  MAGNESIUM    EKG None  Radiology No results found.  Procedures Procedures (including critical care time)  Medications Ordered in ED Medications  iohexol (OMNIPAQUE) 9 MG/ML oral solution (has no administration in time range)  lactated ringers infusion ( Intravenous New Bag/Given 03/24/20 0555)  potassium chloride 10 mEq in 100 mL IVPB (10 mEq Intravenous New Bag/Given 03/23/20 1224)  HYDROmorphone (DILAUDID) injection 0.5 mg (0.5 mg Intravenous Given 03/22/20  1324)  ondansetron [facility]) injection 4 mg (4 mg Intravenous Given 03/22/20 0634)  magnesium sulfate IVPB 2 g 50 mL (0 g Intravenous Stopped 03/22/20 0925)  potassium chloride 10 mEq in 100 mL IVPB (0 mEq  Intravenous Stopped 03/22/20 0925)  metoCLOPramide (REGLAN) injection 10 mg (10 mg Intravenous Given 03/22/20 0924)  fentaNYL (SUBLIMAZE) injection 50 mcg (50 mcg Intravenous Given 03/22/20 0924)  iohexol (OMNIPAQUE) 300 MG/ML solution 100 mL (100 mLs Intravenous Contrast Given 03/22/20 0837)  piperacillin-tazobactam (ZOSYN) IVPB 3.375 g (0 g Intravenous Stopped 03/22/20 1014)  potassium chloride 10 mEq in 100 mL IVPB (10 mEq Intravenous New Bag/Given 03/22/20 1536)  mometasone-formoterol (DULERA) 100-5 MCG/ACT inhaler (  Duplicate 10/29/00 7253)  potassium chloride SA (KLOR-CON) CR tablet 40 mEq (40 mEq Oral Given 03/23/20 1018)    ED Course  I have reviewed the triage vital signs and the nursing notes.  Pertinent labs & imaging results that were available during my care of the patient were reviewed by me and considered in my medical decision making (see chart for details).  Clinical Course as of Mar 27 1500  Mon Mar 22, 2020  0741 [facility]: 29.6 [BM]    Clinical Course User Index [BM] Noemi Chapel, MD   MDM Rules/Calculators/A&P                          Patient with abdominal pain.  Recently treated for diverticulitis without imaging.  Will get lab work symptomatic treatment and will get CT scan.  Care turned over to Dr. Sabra Heck Final Clinical Impression(s) / ED Diagnoses Final diagnoses:  Perforated sigmoid colon ([place])  Diverticulitis    Rx / DC Orders ED Discharge Orders         Ordered    oxyCODONE (OXY IR/ROXICODONE) 5 MG immediate release tablet  Every 6 hours PRN        03/26/20 0910    ondansetron (ZOFRAN) 4 MG tablet  Every 6 hours PRN        03/26/20 0910    ciprofloxacin (CIPRO) 500 MG tablet  2 times daily,   Status:  Discontinued        03/26/20 0910    metroNIDAZOLE (FLAGYL) 500 MG tablet  3 times daily,   Status:  Discontinued        03/26/20 0910    Increase activity slowly        03/26/20 0910    Diet - low sodium heart healthy        03/26/20 0910    cefpodoxime  (VANTIN) 200 MG tablet  2 times daily,   Status:  Discontinued        03/26/20 0912    amoxicillin-clavulanate (AUGMENTIN) 875-125 MG tablet  Every 12 hours        03/26/20 0949           Davonna Belling, MD 03/22/20 6644    Davonna Belling, MD 03/27/20 1501

## 2020-03-23 LAB — HIV ANTIBODY (ROUTINE TESTING W REFLEX): HIV Screen 4th Generation wRfx: NONREACTIVE

## 2020-03-23 LAB — COMPREHENSIVE METABOLIC PANEL
ALT: 19 U/L (ref 0–44)
AST: 15 U/L (ref 15–41)
Albumin: 3.4 g/dL — ABNORMAL LOW (ref 3.5–5.0)
Alkaline Phosphatase: 55 U/L (ref 38–126)
Anion gap: 11 (ref 5–15)
BUN: 8 mg/dL (ref 8–23)
CO2: 30 mmol/L (ref 22–32)
Calcium: 8.8 mg/dL — ABNORMAL LOW (ref 8.9–10.3)
Chloride: 97 mmol/L — ABNORMAL LOW (ref 98–111)
Creatinine, Ser: 0.93 mg/dL (ref 0.44–1.00)
GFR calc Af Amer: >60 mL/min (ref >60)
GFR calc non Af Amer: >60 mL/min (ref >60)
Glucose, Bld: 77 mg/dL (ref 70–99)
Potassium: 3.3 mmol/L — ABNORMAL LOW (ref 3.5–5.1)
Sodium: 138 mmol/L (ref 135–145)
Total Bilirubin: 0.7 mg/dL (ref 0.3–1.2)
Total Protein: 6.6 g/dL (ref 6.5–8.1)

## 2020-03-23 LAB — CBC
HCT: 42.4 % (ref 36.0–46.0)
Hemoglobin: 14.1 g/dL (ref 12.0–15.0)
MCH: 29.9 pg (ref 26.0–34.0)
MCHC: 33.3 g/dL (ref 30.0–36.0)
MCV: 89.8 fL (ref 80.0–100.0)
Platelets: 193 10*3/uL (ref 150–400)
RBC: 4.72 MIL/uL (ref 3.87–5.11)
RDW: 14.4 % (ref 11.5–15.5)
WBC: 11.8 10*3/uL — ABNORMAL HIGH (ref 4.0–10.5)
nRBC: 0 % (ref 0.0–0.2)

## 2020-03-23 MED ORDER — TRAZODONE HCL 50 MG PO TABS: 50.0000 mg | ORAL_TABLET | Freq: Every evening | ORAL | Status: DC | PRN

## 2020-03-23 MED ORDER — POTASSIUM CHLORIDE 10 MEQ/100ML IV SOLN
10.0000 meq | INTRAVENOUS | Status: AC
  Administered 2020-03-23 (×3): 10 meq via INTRAVENOUS
  Filled 2020-03-23: qty 100

## 2020-03-23 MED ORDER — SALINE SPRAY 0.65 % NA SOLN: 1.0000 | NASAL | Status: DC | PRN

## 2020-03-23 MED ORDER — GUAIFENESIN-DM 100-10 MG/5ML PO SYRP: 5.0000 mL | ORAL_SOLUTION | ORAL | Status: DC | PRN

## 2020-03-23 MED ORDER — LACTATED RINGERS IV SOLN: INTRAVENOUS | Status: AC

## 2020-03-23 MED ORDER — POTASSIUM CHLORIDE 10 MEQ/100ML IV SOLN
INTRAVENOUS | Status: AC
  Administered 2020-03-23: 10 meq via INTRAVENOUS
  Filled 2020-03-23: qty 200

## 2020-03-23 MED ORDER — MUSCLE RUB 10-15 % EX CREA
1.0000 "application " | TOPICAL_CREAM | CUTANEOUS | Status: DC | PRN
  Filled 2020-03-23: qty 85

## 2020-03-23 MED ORDER — HYDROCORTISONE (PERIANAL) 2.5 % EX CREA
1.0000 "application " | TOPICAL_CREAM | Freq: Four times a day (QID) | CUTANEOUS | Status: DC | PRN
  Filled 2020-03-23: qty 28.35

## 2020-03-23 MED ORDER — PHENOL 1.4 % MT LIQD: 1.0000 | OROMUCOSAL | Status: DC | PRN

## 2020-03-23 MED ORDER — DEXTROSE-NACL 5-0.45 % IV SOLN: INTRAVENOUS | Status: DC

## 2020-03-23 MED ORDER — HYDRALAZINE HCL 20 MG/ML IJ SOLN: 10.0000 mg | INTRAMUSCULAR | Status: DC | PRN

## 2020-03-23 MED ORDER — POLYVINYL ALCOHOL 1.4 % OP SOLN: 1.0000 [drp] | OPHTHALMIC | Status: DC | PRN

## 2020-03-23 MED ORDER — LORATADINE 10 MG PO TABS: 10.0000 mg | ORAL_TABLET | Freq: Every day | ORAL | Status: DC | PRN

## 2020-03-23 MED ORDER — SENNOSIDES-DOCUSATE SODIUM 8.6-50 MG PO TABS: 2.0000 | ORAL_TABLET | Freq: Every evening | ORAL | Status: DC | PRN

## 2020-03-23 MED ORDER — HYDROCORTISONE 1 % EX CREA
1.0000 "application " | TOPICAL_CREAM | Freq: Three times a day (TID) | CUTANEOUS | Status: DC | PRN
  Filled 2020-03-23: qty 28

## 2020-03-23 MED ORDER — OXYCODONE HCL 5 MG PO TABS
5.0000 mg | ORAL_TABLET | ORAL | Status: DC | PRN
  Administered 2020-03-23 – 2020-03-26 (×6): 5 mg via ORAL
  Filled 2020-03-23 (×6): qty 1

## 2020-03-23 MED ORDER — IPRATROPIUM-ALBUTEROL 0.5-2.5 (3) MG/3ML IN SOLN: 3.0000 mL | RESPIRATORY_TRACT | Status: DC | PRN

## 2020-03-23 MED ORDER — LIP MEDEX EX OINT
1.0000 "application " | TOPICAL_OINTMENT | CUTANEOUS | Status: DC | PRN
  Filled 2020-03-23: qty 7

## 2020-03-23 MED ORDER — POTASSIUM CHLORIDE CRYS ER 20 MEQ PO TBCR
40.0000 meq | EXTENDED_RELEASE_TABLET | Freq: Once | ORAL | Status: AC
  Administered 2020-03-23: 40 meq via ORAL
  Filled 2020-03-23: qty 2

## 2020-03-23 MED ORDER — POLYETHYLENE GLYCOL 3350 17 G PO PACK: 17.0000 g | PACK | Freq: Every day | ORAL | Status: DC | PRN

## 2020-03-23 MED ORDER — ALUM & MAG HYDROXIDE-SIMETH 200-200-20 MG/5ML PO SUSP: 30.0000 mL | ORAL | Status: DC | PRN

## 2020-03-23 NOTE — Progress Notes (Signed)
PROGRESS NOTE    TEIGEN PARSLOW  ZDG:[id number] DOB: Nov 30, 1960 DOA: 03/22/2020 PCP: Claretta Fraise, MD   Brief Narrative:  59 year old with prior history of diverticulitis treated outpatient with antibiotics developed lower abdominal pain with nausea vomiting. Initially prescribed Cipro and Flagyl by her PCP without any help. CT here showed sigmoid diverticulitis with microperforation/abscess. Potassium was 2.6.   Assessment & Plan:   Active Problems:   Essential hypertension   Asthma   Diverticulitis of intestine with perforation and abscess   Hypokalemia   Diverticulitis of large intestine with perforation without bleeding  acute sigmoid diverticulitis with microperforation and abscess -currently being managed with IV antibiotics-Zosyn -leukocytosis improving -clear liquid diet, will slowly advance as tolerated -General surgery consulted. If necessary and her pain worsens we will get IR involved. According to her she is due for colonoscopy which she will need in 6-8 weeks time upon discharge.  Hypokalemia -aggressive repletion  essential hypertension -losartan and hydrochlorothiazide on hold, getting IV fluids.  history of asthma -bronchodilators as needed. Out of bed whenever possible  hypothyroidism -continue Synthroid  GERD -PPI and Pepcid    DVT prophylaxis: Lovenox Code Status: Full code Family Communication:    Status is: Inpatient  Remains inpatient appropriate because:IV treatments appropriate due to intensity of illness or inability to take PO   Dispo: The patient is from: Home              Anticipated d/c is to: Home              Anticipated d/c date is: 2 days              Patient currently is not medically stable to d/c. Patient is requiring IV fluids and antibiotics for her acute diverticulitis. Slowly advancing diet. Hopefully transition him home in the next couple of days when she is able to tolerate orals appropriately.       Body  mass index is 32.77 kg/m.       Subjective: Lower abdominal pain is slightly better this morning, denies any blood in the stool. According to her her last colonoscopy was several years ago and is due for one soon.  Review of Systems Otherwise negative except as per HPI, including: General: Denies fever, chills, night sweats or unintended weight loss. Resp: Denies cough, wheezing, shortness of breath. Cardiac: Denies chest pain, palpitations, orthopnea, paroxysmal nocturnal dyspnea. GI: Denies abdominal pain, nausea, vomiting, diarrhea or constipation GU: Denies dysuria, frequency, hesitancy or incontinence MS: Denies muscle aches, joint pain or swelling Neuro: Denies headache, neurologic deficits (focal weakness, numbness, tingling), abnormal gait Psych: Denies anxiety, depression, SI/HI/AVH Skin: Denies new rashes or lesions ID: Denies sick contacts, exotic exposures, travel  Examination:  General exam: Appears calm and comfortable  Respiratory system: Clear to auscultation. Respiratory effort normal. Cardiovascular system: S1 & S2 heard, RRR. No JVD, murmurs, rubs, gallops or clicks. No pedal edema. Gastrointestinal system: Mild tenderness to palpation in the lower abdomen, positive bowel sounds. Central nervous system: Alert and oriented. No focal neurological deficits. Extremities: Symmetric 5 x 5 power. Skin: No rashes, lesions or ulcers Psychiatry: Judgement and insight appear normal. Mood & affect appropriate.     Objective: Vitals:   03/22/20 2011 03/22/20 2136 03/22/20 2347 03/23/20 0337  BP: 107/62  106/61 110/74  Pulse: 73  74 73  Resp: 16  16 15   Temp: 98.4 F (36.9 C)  98.8 F (37.1 C)   TempSrc: Oral     SpO2: 94% 91%  91% 94%  Weight:      Height:        Intake/Output Summary (Last 24 hours) at 03/23/2020 0817 Last data filed at 03/22/2020 1800 Gross per 24 hour  Intake 620.8 ml  Output --  Net 620.8 ml   Filed Weights   03/22/20 0612 03/22/20  1739  Weight: 81.6 kg 83.9 kg     Data Reviewed:   CBC: Recent Labs  Lab 03/22/20 0631 03/23/20 0539  WBC 16.6* 11.8*  NEUTROABS 12.6*  --   HGB 15.7* 14.1  HCT 46.2* 42.4  MCV 87.2 89.8  PLT 213 124   Basic Metabolic Panel: Recent Labs  Lab 03/22/20 0631 03/23/20 0539  NA 135 138  K 2.6* 3.3*  CL 94* 97*  CO2 28 30  GLUCOSE 104* 77  BUN 13 8  CREATININE 1.05* 0.93  CALCIUM 9.5 8.8*   GFR: Estimated Creatinine Clearance: 65.2 mL/min (by C-G formula based on SCr of 0.93 mg/dL). Liver Function Tests: Recent Labs  Lab 03/22/20 0631 03/23/20 0539  AST 27 15  ALT 26 19  ALKPHOS 68 55  BILITOT 0.8 0.7  PROT 7.8 6.6  ALBUMIN 4.2 3.4*   No results for input(s): LIPASE, AMYLASE in the last 168 hours. No results for input(s): AMMONIA in the last 168 hours. Coagulation Profile: No results for input(s): INR, PROTIME in the last 168 hours. Cardiac Enzymes: No results for input(s): CKTOTAL, CKMB, CKMBINDEX, TROPONINI in the last 168 hours. BNP (last 3 results) No results for input(s): PROBNP in the last 8760 hours. HbA1C: No results for input(s): HGBA1C in the last 72 hours. CBG: No results for input(s): GLUCAP in the last 168 hours. Lipid Profile: No results for input(s): CHOL, HDL, LDLCALC, TRIG, CHOLHDL, LDLDIRECT in the last 72 hours. Thyroid Function Tests: No results for input(s): TSH, T4TOTAL, FREET4, T3FREE, THYROIDAB in the last 72 hours. Anemia Panel: No results for input(s): VITAMINB12, FOLATE, FERRITIN, TIBC, IRON, RETICCTPCT in the last 72 hours. Sepsis Labs: No results for input(s): PROCALCITON, LATICACIDVEN in the last 168 hours.  Recent Results (from the past 240 hour(s))  SARS Coronavirus 2 by RT PCR (hospital order, performed in [facility] hospital lab) Nasopharyngeal Nasopharyngeal Swab     Status: None   Collection Time: 03/22/20 10:27 AM   Specimen: Nasopharyngeal Swab  Result Value Ref Range Status   SARS Coronavirus 2 NEGATIVE  NEGATIVE Final    Comment: (NOTE) SARS-CoV-2 target nucleic acids are NOT DETECTED.  The SARS-CoV-2 RNA is generally detectable in upper and lower respiratory specimens during the acute phase of infection. The lowest concentration of SARS-CoV-2 viral copies this assay can detect is 250 copies / mL. A negative result does not preclude SARS-CoV-2 infection and should not be used as the sole basis for treatment or other patient management decisions.  A negative result may occur with improper specimen collection / handling, submission of specimen other than nasopharyngeal swab, presence of viral mutation(s) within the areas targeted by this assay, and inadequate number of viral copies (<250 copies / mL). A negative result must be combined with clinical observations, patient history, and epidemiological information.  Fact Sheet for Patients:   StrictlyIdeas.no  Fact Sheet for Healthcare Providers: BankingDealers.co.za  This test is not yet approved or  cleared by the [place] FDA and has been authorized for detection and/or diagnosis of SARS-CoV-2 by FDA under an Emergency Use Authorization (EUA).  This EUA will remain in effect (meaning this test can be used) for  the duration of the COVID-19 declaration under Section 564(b)(1) of the Act, 21 U.S.C. section 360bbb-3(b)(1), unless the authorization is terminated or revoked sooner.  Performed at [facility], [address] [place], [place] [address]          Radiology Studies: CT ABDOMEN PELVIS W CONTRAST  Result Date: 03/22/2020 CLINICAL DATA:  Abdominal pain, history of diverticulitis EXAM: CT ABDOMEN AND PELVIS WITH CONTRAST TECHNIQUE: Multidetector CT imaging of the abdomen and pelvis was performed using the standard protocol following bolus administration of intravenous contrast. CONTRAST:  167mL OMNIPAQUE IOHEXOL 300 MG/ML  SOLN COMPARISON:  2015 FINDINGS: Lower chest: No  acute abnormality. Hepatobiliary: No focal liver abnormality is seen. No gallstones, gallbladder wall thickening, or biliary dilatation. Pancreas: Unremarkable. Spleen: Similar nonspecific small low-density lesion. Otherwise unremarkable. Adrenals/Urinary Tract: Adrenals, kidneys, and bladder are unremarkable. Stomach/Bowel: Bowel is normal in caliber. There is extensive distal colonic diverticulosis. There are inflammatory changes about the sigmoid colon with an adjacent air and fluid containing collection measuring approximately 2.5 x 2.9 x 1.6 cm. Small hiatal hernia. Normal appendix. Vascular/Lymphatic: Minimal aortic atherosclerosis. No enlarged lymph nodes identified. Reproductive: Anteverted uterus. Above inflammatory changes are along the dorsal aspect of the uterine body. No adnexal mass. Other: Abdominal wall is unremarkable. Musculoskeletal: No acute osseous abnormality. IMPRESSION: Acute sigmoid diverticulitis with microperforation and small abscess formation between the sigmoid and uterus. These results were called by telephone at the time of interpretation on 03/22/2020 at 8:57 am to provider Dr. Sabra Heck, who verbally acknowledged these results. Electronically Signed   By: Macy Mis M.D.   On: 03/22/2020 08:58        Scheduled Meds: . enoxaparin (LOVENOX) injection  40 mg Subcutaneous Q24H  . famotidine  20 mg Oral BID  . levothyroxine  125 mcg Oral Daily  . mometasone-formoterol  2 puff Inhalation BID  . montelukast  10 mg Oral QHS  . pantoprazole  40 mg Oral Daily   Continuous Infusions: . sodium chloride    . lactated ringers with kcl    . piperacillin-tazobactam (ZOSYN)  IV 3.375 g (03/23/20 0550)     LOS: 1 day   Time spent= 35 mins    Nasra Counce Arsenio Loader, MD [facility]  If 7PM-7AM, please contact night-coverage  03/23/2020, 8:17 AM

## 2020-03-23 NOTE — Progress Notes (Signed)
[facility] Progress Note     Subjective: Feeling better. Pain improved. No nausea.   Objective: Vital signs in last 24 hours: Temp:  [98.3 F (36.8 C)-98.8 F (37.1 C)] 98.8 F (37.1 C) (08/23 2347) Pulse Rate:  [73-86] 73 (08/24 0337) Resp:  [14-18] 15 (08/24 0337) BP: (105-129)/(61-76) 110/74 (08/24 0337) SpO2:  [85 %-98 %] 93 % (08/24 0855) Weight:  [83.9 kg] 83.9 kg (08/23 1739) Last BM Date: 03/22/20  Intake/Output from previous day: 08/23 0701 - 08/24 0700 In: 620.8 [IV Piggyback:620.8] Out: -  Intake/Output this shift: No intake/output data recorded.  General appearance: alert, cooperative and no distress Resp: normal work of breathing GI: soft, nondistended, tender in lower abdomen  Lab Results:  Recent Labs    03/22/20 0631 03/23/20 0539  WBC 16.6* 11.8*  HGB 15.7* 14.1  HCT 46.2* 42.4  PLT 213 193   BMET Recent Labs    03/22/20 0631 03/23/20 0539  NA 135 138  K 2.6* 3.3*  CL 94* 97*  CO2 28 30  GLUCOSE 104* 77  BUN 13 8  CREATININE 1.05* 0.93  CALCIUM 9.5 8.8*   PT/INR No results for input(s): LABPROT, INR in the last 72 hours.  Studies/Results: CT ABDOMEN PELVIS W CONTRAST  Result Date: 03/22/2020 CLINICAL DATA:  Abdominal pain, history of diverticulitis EXAM: CT ABDOMEN AND PELVIS WITH CONTRAST TECHNIQUE: Multidetector CT imaging of the abdomen and pelvis was performed using the standard protocol following bolus administration of intravenous contrast. CONTRAST:  134mL OMNIPAQUE IOHEXOL 300 MG/ML  SOLN COMPARISON:  2015 FINDINGS: Lower chest: No acute abnormality. Hepatobiliary: No focal liver abnormality is seen. No gallstones, gallbladder wall thickening, or biliary dilatation. Pancreas: Unremarkable. Spleen: Similar nonspecific small low-density lesion. Otherwise unremarkable. Adrenals/Urinary Tract: Adrenals, kidneys, and bladder are unremarkable. Stomach/Bowel: Bowel is normal in caliber. There is extensive distal colonic  diverticulosis. There are inflammatory changes about the sigmoid colon with an adjacent air and fluid containing collection measuring approximately 2.5 x 2.9 x 1.6 cm. Small hiatal hernia. Normal appendix. Vascular/Lymphatic: Minimal aortic atherosclerosis. No enlarged lymph nodes identified. Reproductive: Anteverted uterus. Above inflammatory changes are along the dorsal aspect of the uterine body. No adnexal mass. Other: Abdominal wall is unremarkable. Musculoskeletal: No acute osseous abnormality. IMPRESSION: Acute sigmoid diverticulitis with microperforation and small abscess formation between the sigmoid and uterus. These results were called by telephone at the time of interpretation on 03/22/2020 at 8:57 am to provider Dr. Sabra Heck, who verbally acknowledged these results. Electronically Signed   By: Macy Mis M.D.   On: 03/22/2020 08:58    Anti-infectives: Anti-infectives (From admission, onward)   Start     Dose/Rate Route Frequency Ordered Stop   03/22/20 1430  piperacillin-tazobactam (ZOSYN) IVPB 3.375 g        3.375 g 12.5 mL/hr over 240 Minutes Intravenous Every 8 hours 03/22/20 1421     03/22/20 0930  piperacillin-tazobactam (ZOSYN) IVPB 3.375 g        3.375 g 100 mL/hr over 30 Minutes Intravenous  Once 03/22/20 0915 03/22/20 1014      Assessment/Plan: Sarah Garrett is a 59 yo with perforated diverticulitis. Doing well and improving.  Continue IV antibiotics  Added Roxi for moderate pain Adv diet as tolerated to soft/ bland diet   Discussed with Dr. Reesa Chew and RN.  LOS: 1 day    Sarah Garrett 03/23/2020

## 2020-03-24 LAB — CBC
HCT: 39.6 % (ref 36.0–46.0)
Hemoglobin: 13.1 g/dL (ref 12.0–15.0)
MCH: 29.6 pg (ref 26.0–34.0)
MCHC: 33.1 g/dL (ref 30.0–36.0)
MCV: 89.6 fL (ref 80.0–100.0)
Platelets: 187 10*3/uL (ref 150–400)
RBC: 4.42 MIL/uL (ref 3.87–5.11)
RDW: 14.4 % (ref 11.5–15.5)
WBC: 11.7 10*3/uL — ABNORMAL HIGH (ref 4.0–10.5)
nRBC: 0 % (ref 0.0–0.2)

## 2020-03-24 LAB — COMPREHENSIVE METABOLIC PANEL
ALT: 16 U/L (ref 0–44)
AST: 12 U/L — ABNORMAL LOW (ref 15–41)
Albumin: 3.1 g/dL — ABNORMAL LOW (ref 3.5–5.0)
Alkaline Phosphatase: 46 U/L (ref 38–126)
Anion gap: 8 (ref 5–15)
BUN: 6 mg/dL — ABNORMAL LOW (ref 8–23)
CO2: 28 mmol/L (ref 22–32)
Calcium: 8.6 mg/dL — ABNORMAL LOW (ref 8.9–10.3)
Chloride: 102 mmol/L (ref 98–111)
Creatinine, Ser: 0.79 mg/dL (ref 0.44–1.00)
GFR calc Af Amer: >60 mL/min (ref >60)
GFR calc non Af Amer: >60 mL/min (ref >60)
Glucose, Bld: 82 mg/dL (ref 70–99)
Potassium: 3.7 mmol/L (ref 3.5–5.1)
Sodium: 138 mmol/L (ref 135–145)
Total Bilirubin: 0.6 mg/dL (ref 0.3–1.2)
Total Protein: 6 g/dL — ABNORMAL LOW (ref 6.5–8.1)

## 2020-03-24 LAB — MAGNESIUM: Magnesium: 2.2 mg/dL (ref 1.7–2.4)

## 2020-03-24 MED ORDER — LACTATED RINGERS IV SOLN: INTRAVENOUS | Status: DC

## 2020-03-24 NOTE — Progress Notes (Signed)
Patient ID: RANDI POULLARD, female   DOB: 1961/01/14, 59 y.o.   MRN: [id number]  PROGRESS NOTE    DESIRIE MINTEER  VFI:[id number] DOB: [birth year] DOA: 03/22/2020 PCP: Claretta Fraise, MD   Brief Narrative:  59 year old female with prior history of diverticulitis treated outpatient with antibiotics developed lower abdominal pain with nausea vomiting. Initially prescribed Cipro and Flagyl by her PCP without any help. CT on presentation showed sigmoid diverticulitis with microperforation/abscess.  She was started on broad-spectrum antibiotics.  General surgery was consulted.  Assessment & Plan:  Acute sigmoid diverticulitis with microperforation and abscess -General surgery following.  Diet advancement as per general surgery.  Still having intermittent pain with some nausea -Continue Zosyn. -Leukocytosis is improving. - will need outpatient colonoscopy in 6 to 8 weeks time after discharge   Hypokalemia -Improved  essential hypertension -losartan and hydrochlorothiazide on hold.  Blood pressure stable for now.  history of asthma -bronchodilators as needed.   hypothyroidism -continue Synthroid  GERD -PPI and Pepcid   DVT prophylaxis: Lovenox Code Status: Full Family Communication: Patient at bedside Disposition Plan: Status is: Inpatient  Remains inpatient appropriate because:Inpatient level of care appropriate due to severity of illness   Dispo: The patient is from: Home              Anticipated d/c is to: Home              Anticipated d/c date is: 1 day              Patient currently is not medically stable to d/c.  Patient still complaining of intermittent abdominal pain with nausea.   Consultants: General surgery  Procedures: None  Antimicrobials:  Anti-infectives (From admission, onward)   Start     Dose/Rate Route Frequency Ordered Stop   03/22/20 1430  piperacillin-tazobactam (ZOSYN) IVPB 3.375 g        3.375 g 12.5 mL/hr over 240 Minutes  Intravenous Every 8 hours 03/22/20 1421     03/22/20 0930  piperacillin-tazobactam (ZOSYN) IVPB 3.375 g        3.375 g 100 mL/hr over 30 Minutes Intravenous  Once 03/22/20 0915 03/22/20 1014       Subjective: Patient seen and examined at bedside.  Feels slightly better but still complains of intermittent abdominal pain with some nausea.  Tolerated liquid diet last night.  No overnight fever reported.  Objective: Vitals:   03/23/20 2043 03/23/20 2139 03/24/20 0541 03/24/20 0740  BP:  123/76 112/66   Pulse:  79 80   Resp:  16 16   Temp:  98.2 F (36.8 C) 99 F (37.2 C)   TempSrc:  Oral Oral   SpO2: 96% 98% 96% 97%  Weight:      Height:        Intake/Output Summary (Last 24 hours) at 03/24/2020 0912 Last data filed at 03/23/2020 1500 Gross per 24 hour  Intake 1465.69 ml  Output --  Net 1465.69 ml   Filed Weights   03/22/20 0612 03/22/20 1739  Weight: 81.6 kg 83.9 kg    Examination:  General exam: Appears calm and comfortable  Respiratory system: Bilateral decreased breath sounds at bases Cardiovascular system: S1 & S2 heard, Rate controlled Gastrointestinal system: Abdomen is nondistended, soft and mildly tender in the lower quadrant.  Normal bowel sounds heard. Extremities: No cyanosis, clubbing, edema    Data Reviewed: I have personally reviewed following labs and imaging studies  CBC: Recent Labs  Lab 03/22/20 0631 03/23/20 0539 03/24/20  0547  WBC 16.6* 11.8* 11.7*  NEUTROABS 12.6*  --   --   HGB 15.7* 14.1 13.1  HCT 46.2* 42.4 39.6  MCV 87.2 89.8 89.6  PLT 213 193 263   Basic Metabolic Panel: Recent Labs  Lab 03/22/20 0631 03/23/20 0539 03/24/20 0547  NA 135 138 138  K 2.6* 3.3* 3.7  CL 94* 97* 102  CO2 28 30 28   GLUCOSE 104* 77 82  BUN 13 8 6*  CREATININE 1.05* 0.93 0.79  CALCIUM 9.5 8.8* 8.6*  MG  --   --  2.2   GFR: Estimated Creatinine Clearance: 75.8 mL/min (by C-G formula based on SCr of 0.79 mg/dL). Liver Function Tests: Recent  Labs  Lab 03/22/20 0631 03/23/20 0539 03/24/20 0547  AST 27 15 12*  ALT 26 19 16   ALKPHOS 68 55 46  BILITOT 0.8 0.7 0.6  PROT 7.8 6.6 6.0*  ALBUMIN 4.2 3.4* 3.1*   No results for input(s): LIPASE, AMYLASE in the last 168 hours. No results for input(s): AMMONIA in the last 168 hours. Coagulation Profile: No results for input(s): INR, PROTIME in the last 168 hours. Cardiac Enzymes: No results for input(s): CKTOTAL, CKMB, CKMBINDEX, TROPONINI in the last 168 hours. BNP (last 3 results) No results for input(s): PROBNP in the last 8760 hours. HbA1C: No results for input(s): HGBA1C in the last 72 hours. CBG: No results for input(s): GLUCAP in the last 168 hours. Lipid Profile: No results for input(s): CHOL, HDL, LDLCALC, TRIG, CHOLHDL, LDLDIRECT in the last 72 hours. Thyroid Function Tests: No results for input(s): TSH, T4TOTAL, FREET4, T3FREE, THYROIDAB in the last 72 hours. Anemia Panel: No results for input(s): VITAMINB12, FOLATE, FERRITIN, TIBC, IRON, RETICCTPCT in the last 72 hours. Sepsis Labs: No results for input(s): PROCALCITON, LATICACIDVEN in the last 168 hours.  Recent Results (from the past 240 hour(s))  SARS Coronavirus 2 by RT PCR (hospital order, performed in [facility] hospital lab) Nasopharyngeal Nasopharyngeal Swab     Status: None   Collection Time: 03/22/20 10:27 AM   Specimen: Nasopharyngeal Swab  Result Value Ref Range Status   SARS Coronavirus 2 NEGATIVE NEGATIVE Final    Comment: (NOTE) SARS-CoV-2 target nucleic acids are NOT DETECTED.  The SARS-CoV-2 RNA is generally detectable in upper and lower respiratory specimens during the acute phase of infection. The lowest concentration of SARS-CoV-2 viral copies this assay can detect is 250 copies / mL. A negative result does not preclude SARS-CoV-2 infection and should not be used as the sole basis for treatment or other patient management decisions.  A negative result may occur with improper specimen  collection / handling, submission of specimen other than nasopharyngeal swab, presence of viral mutation(s) within the areas targeted by this assay, and inadequate number of viral copies (<250 copies / mL). A negative result must be combined with clinical observations, patient history, and epidemiological information.  Fact Sheet for Patients:   StrictlyIdeas.no  Fact Sheet for Healthcare Providers: BankingDealers.co.za  This test is not yet approved or  cleared by the [place] FDA and has been authorized for detection and/or diagnosis of SARS-CoV-2 by FDA under an Emergency Use Authorization (EUA).  This EUA will remain in effect (meaning this test can be used) for the duration of the COVID-19 declaration under Section 564(b)(1) of the Act, 21 U.S.C. section 360bbb-3(b)(1), unless the authorization is terminated or revoked sooner.  Performed at [facility], [address] [place], [place] [address]  Radiology Studies: No results found.      Scheduled Meds: . enoxaparin (LOVENOX) injection  40 mg Subcutaneous Q24H  . famotidine  20 mg Oral BID  . levothyroxine  125 mcg Oral Daily  . mometasone-formoterol  2 puff Inhalation BID  . montelukast  10 mg Oral QHS  . pantoprazole  40 mg Oral Daily   Continuous Infusions: . sodium chloride    . piperacillin-tazobactam (ZOSYN)  IV 3.375 g (03/24/20 0604)          Aline August, MD [facility] 03/24/2020, 9:12 AM

## 2020-03-24 NOTE — Progress Notes (Signed)
[facility] Progress Note     Subjective: Seen this am. Having some pain in lower abdomen and had some vomiting overnight.   Objective: Vital signs in last 24 hours: Temp:  [98.2 F (36.8 C)-99 F (37.2 C)] 99 F (37.2 C) (08/25 0541) Pulse Rate:  [79-80] 80 (08/25 0541) Resp:  [16] 16 (08/25 0541) BP: (112-123)/(66-76) 112/66 (08/25 0541) SpO2:  [96 %-98 %] 97 % (08/25 0740) Last BM Date: 03/23/20  Intake/Output from previous day: 08/24 0701 - 08/25 0700 In: 1465.7 [P.O.:480; I.V.:650; IV Piggyback:335.7] Out: -  Intake/Output this shift: No intake/output data recorded.  General appearance: alert, cooperative and mild distress Resp: normal work of breathing GI: soft, nondistended, tender lower abdomen  Lab Results:  Recent Labs    03/23/20 0539 03/24/20 0547  WBC 11.8* 11.7*  HGB 14.1 13.1  HCT 42.4 39.6  PLT 193 187   BMET Recent Labs    03/23/20 0539 03/24/20 0547  NA 138 138  K 3.3* 3.7  CL 97* 102  CO2 30 28  GLUCOSE 77 82  BUN 8 6*  CREATININE 0.93 0.79  CALCIUM 8.8* 8.6*   Anti-infectives: Anti-infectives (From admission, onward)   Start     Dose/Rate Route Frequency Ordered Stop   03/22/20 1430  piperacillin-tazobactam (ZOSYN) IVPB 3.375 g        3.375 g 12.5 mL/hr over 240 Minutes Intravenous Every 8 hours 03/22/20 1421     03/22/20 0930  piperacillin-tazobactam (ZOSYN) IVPB 3.375 g        3.375 g 100 mL/hr over 30 Minutes Intravenous  Once 03/22/20 0915 03/22/20 1014      Assessment/Plan: Ms. Jean is a 59 yo with perforated diverticulitis. Doing fair but having pain and not tolerating diet yesterday. Continue diet today and adv as tolerated Would continue IV antibiotics for now Roxicodone is helping with pain  Discussed with Dr. Sharlene Dory and RN.    LOS: 2 days    Virl Cagey 03/24/2020

## 2020-03-25 ENCOUNTER — Ambulatory Visit: Payer: BC Managed Care – PPO | Admitting: Family Medicine

## 2020-03-25 LAB — CBC
HCT: 40.4 % (ref 36.0–46.0)
Hemoglobin: 13.4 g/dL (ref 12.0–15.0)
MCH: 29.8 pg (ref 26.0–34.0)
MCHC: 33.2 g/dL (ref 30.0–36.0)
MCV: 90 fL (ref 80.0–100.0)
Platelets: 201 10*3/uL (ref 150–400)
RBC: 4.49 MIL/uL (ref 3.87–5.11)
RDW: 14.5 % (ref 11.5–15.5)
WBC: 9.7 10*3/uL (ref 4.0–10.5)
nRBC: 0 % (ref 0.0–0.2)

## 2020-03-25 LAB — COMPREHENSIVE METABOLIC PANEL
ALT: 16 U/L (ref 0–44)
AST: 15 U/L (ref 15–41)
Albumin: 3.4 g/dL — ABNORMAL LOW (ref 3.5–5.0)
Alkaline Phosphatase: 47 U/L (ref 38–126)
Anion gap: 8 (ref 5–15)
BUN: <5 mg/dL — ABNORMAL LOW (ref 8–23)
CO2: 29 mmol/L (ref 22–32)
Calcium: 8.9 mg/dL (ref 8.9–10.3)
Chloride: 102 mmol/L (ref 98–111)
Creatinine, Ser: 0.76 mg/dL (ref 0.44–1.00)
GFR calc Af Amer: >60 mL/min (ref >60)
GFR calc non Af Amer: >60 mL/min (ref >60)
Glucose, Bld: 92 mg/dL (ref 70–99)
Potassium: 3.6 mmol/L (ref 3.5–5.1)
Sodium: 139 mmol/L (ref 135–145)
Total Bilirubin: 0.6 mg/dL (ref 0.3–1.2)
Total Protein: 6.6 g/dL (ref 6.5–8.1)

## 2020-03-25 LAB — MAGNESIUM: Magnesium: 2.1 mg/dL (ref 1.7–2.4)

## 2020-03-25 NOTE — Progress Notes (Signed)
Patient ID: Sarah Garrett, female   DOB: [birth year], 59 y.o.   MRN: [id number]  PROGRESS NOTE    NIL BOLSER  [id number]:[id number] DOB: 1960/08/03 DOA: 03/22/2020 PCP: Claretta Fraise, MD   Brief Narrative:  59 year old female with prior history of diverticulitis treated outpatient with antibiotics developed lower abdominal pain with nausea vomiting. Initially prescribed Cipro and Flagyl by her PCP without any help. CT on presentation showed sigmoid diverticulitis with microperforation/abscess.  She was started on broad-spectrum antibiotics.  General surgery was consulted.  Assessment & Plan:  Acute sigmoid diverticulitis with microperforation and abscess -General surgery following.  Diet advancement as per general surgery.  Currently on full liquid diet.  Still having intermittent pain with some nausea -Continue Zosyn. -Leukocytosis is improving.  Labs pending for today. - will need outpatient colonoscopy in 6 to 8 weeks time after discharge   Hypokalemia -Improved.    essential hypertension -losartan and hydrochlorothiazide on hold.  Blood pressure stable for now.  history of asthma -bronchodilators as needed.   hypothyroidism -continue Synthroid  GERD -PPI and Pepcid   DVT prophylaxis: Lovenox Code Status: Full Family Communication: Patient at bedside Disposition Plan: Status is: Inpatient  Remains inpatient appropriate because:Inpatient level of care appropriate due to severity of illness   Dispo: The patient is from: Home              Anticipated d/c is to: Home              Anticipated d/c date is: 1 day              Patient currently is not medically stable to d/c.  Patient still complaining of intermittent abdominal pain with nausea.  Will need diet advancement to soft diet and tolerance of diet prior to discharge.   Consultants: General surgery  Procedures: None  Antimicrobials:  Anti-infectives (From admission, onward)   Start     Dose/Rate  Route Frequency Ordered Stop   03/22/20 1430  piperacillin-tazobactam (ZOSYN) IVPB 3.375 g        3.375 g 12.5 mL/hr over 240 Minutes Intravenous Every 8 hours 03/22/20 1421     03/22/20 0930  piperacillin-tazobactam (ZOSYN) IVPB 3.375 g        3.375 g 100 mL/hr over 30 Minutes Intravenous  Once 03/22/20 0915 03/22/20 1014       Subjective: Patient seen and examined at bedside.  Tolerating full liquid diet.  Still having intermittent nausea with some abdominal pain.  No vomiting.  No overnight fever, worsening shortness breath or chest pain reported.  Objective: Vitals:   03/24/20 1514 03/24/20 2027 03/24/20 2049 03/25/20 0428  BP: 102/71 107/63  98/63  Pulse: 82 73  78  Resp: 18 16  20   Temp: 98.4 F (36.9 C) 98.2 F (36.8 C)  98.4 F (36.9 C)  TempSrc: Oral Oral  Oral  SpO2: 97% 98% 98% 95%  Weight:      Height:        Intake/Output Summary (Last 24 hours) at 03/25/2020 0746 Last data filed at 03/24/2020 1800 Gross per 24 hour  Intake 200.91 ml  Output --  Net 200.91 ml   Filed Weights   03/22/20 0612 03/22/20 1739  Weight: 81.6 kg 83.9 kg    Examination:  General exam: No acute distress Respiratory system: Bilateral decreased breath sounds at bases with some scattered crackles Cardiovascular system: Rate controlled, S1-S2 heard Gastrointestinal system: Abdomen is nondistended, soft and has mild tenderness in the lower quadrant.  Bowel sounds are heard Extremities: No edema, clubbing   Data Reviewed: I have personally reviewed following labs and imaging studies  CBC: Recent Labs  Lab 03/22/20 0631 03/23/20 0539 03/24/20 0547  WBC 16.6* 11.8* 11.7*  NEUTROABS 12.6*  --   --   HGB 15.7* 14.1 13.1  HCT 46.2* 42.4 39.6  MCV 87.2 89.8 89.6  PLT 213 193 161   Basic Metabolic Panel: Recent Labs  Lab 03/22/20 0631 03/23/20 0539 03/24/20 0547  NA 135 138 138  K 2.6* 3.3* 3.7  CL 94* 97* 102  CO2 28 30 28   GLUCOSE 104* 77 82  BUN 13 8 6*  CREATININE  1.05* 0.93 0.79  CALCIUM 9.5 8.8* 8.6*  MG  --   --  2.2   GFR: Estimated Creatinine Clearance: 75.8 mL/min (by C-G formula based on SCr of 0.79 mg/dL). Liver Function Tests: Recent Labs  Lab 03/22/20 0631 03/23/20 0539 03/24/20 0547  AST 27 15 12*  ALT 26 19 16   ALKPHOS 68 55 46  BILITOT 0.8 0.7 0.6  PROT 7.8 6.6 6.0*  ALBUMIN 4.2 3.4* 3.1*   No results for input(s): LIPASE, AMYLASE in the last 168 hours. No results for input(s): AMMONIA in the last 168 hours. Coagulation Profile: No results for input(s): INR, PROTIME in the last 168 hours. Cardiac Enzymes: No results for input(s): CKTOTAL, CKMB, CKMBINDEX, TROPONINI in the last 168 hours. BNP (last 3 results) No results for input(s): PROBNP in the last 8760 hours. HbA1C: No results for input(s): HGBA1C in the last 72 hours. CBG: No results for input(s): GLUCAP in the last 168 hours. Lipid Profile: No results for input(s): CHOL, HDL, LDLCALC, TRIG, CHOLHDL, LDLDIRECT in the last 72 hours. Thyroid Function Tests: No results for input(s): TSH, T4TOTAL, FREET4, T3FREE, THYROIDAB in the last 72 hours. Anemia Panel: No results for input(s): VITAMINB12, FOLATE, FERRITIN, TIBC, IRON, RETICCTPCT in the last 72 hours. Sepsis Labs: No results for input(s): PROCALCITON, LATICACIDVEN in the last 168 hours.  Recent Results (from the past 240 hour(s))  SARS Coronavirus 2 by RT PCR (hospital order, performed in [facility] hospital lab) Nasopharyngeal Nasopharyngeal Swab     Status: None   Collection Time: 03/22/20 10:27 AM   Specimen: Nasopharyngeal Swab  Result Value Ref Range Status   SARS Coronavirus 2 NEGATIVE NEGATIVE Final    Comment: (NOTE) SARS-CoV-2 target nucleic acids are NOT DETECTED.  The SARS-CoV-2 RNA is generally detectable in upper and lower respiratory specimens during the acute phase of infection. The lowest concentration of SARS-CoV-2 viral copies this assay can detect is 250 copies / mL. A negative result  does not preclude SARS-CoV-2 infection and should not be used as the sole basis for treatment or other patient management decisions.  A negative result may occur with improper specimen collection / handling, submission of specimen other than nasopharyngeal swab, presence of viral mutation(s) within the areas targeted by this assay, and inadequate number of viral copies (<250 copies / mL). A negative result must be combined with clinical observations, patient history, and epidemiological information.  Fact Sheet for Patients:   StrictlyIdeas.no  Fact Sheet for Healthcare Providers: BankingDealers.co.za  This test is not yet approved or  cleared by the [place] FDA and has been authorized for detection and/or diagnosis of SARS-CoV-2 by FDA under an Emergency Use Authorization (EUA).  This EUA will remain in effect (meaning this test can be used) for the duration of the COVID-19 declaration under Section 564(b)(1) of the  Act, 21 U.S.C. section 360bbb-3(b)(1), unless the authorization is terminated or revoked sooner.  Performed at [facility], [address], [place], [place] [address]          Radiology Studies: No results found.      Scheduled Meds: . enoxaparin (LOVENOX) injection  40 mg Subcutaneous Q24H  . famotidine  20 mg Oral BID  . levothyroxine  125 mcg Oral Daily  . mometasone-formoterol  2 puff Inhalation BID  . montelukast  10 mg Oral QHS  . pantoprazole  40 mg Oral Daily   Continuous Infusions: . sodium chloride    . lactated ringers 50 mL/hr at 03/25/20 0326  . piperacillin-tazobactam (ZOSYN)  IV 3.375 g (03/25/20 0541)          Aline August, MD [facility] 03/25/2020, 7:46 AM

## 2020-03-25 NOTE — Progress Notes (Signed)
Pharmacy Antibiotic Note Today is day #4 of Zosyn therapy for this   59 y.o. female admitted on 03/22/2020 with perforated diverticulitis w/o abscess. WBC count is WNL and patient is afebrile. Renal function remains stable.   Plan: Zosyn 3.375g IV q8h (4 hour infusion).  F/U cxs and clinical progress Monitor V/S, labs  Height: 5\' 3"  (160 cm) Weight: 83.9 kg (184 lb 15.5 oz) IBW/kg (Calculated) : 52.4  Temp (24hrs), Avg:98.3 F (36.8 C), Min:98.2 F (36.8 C), Max:98.4 F (36.9 C)  Recent Labs  Lab 03/22/20 0631 03/23/20 0539 03/24/20 0547 03/25/20 0810  WBC 16.6* 11.8* 11.7* 9.7  CREATININE 1.05* 0.93 0.79 0.76    Estimated Creatinine Clearance: 75.8 mL/min (by C-G formula based on SCr of 0.76 mg/dL).    Antimicrobials this admission: Zosyn 8/23>>    Microbiology results: 8/23 SARs-2 CV is negative  Thank you for allowing pharmacy to be a part of this patient's care.  Despina Pole, Pharm. D. Clinical Pharmacist 03/25/2020 4:26 PM

## 2020-03-25 NOTE — Discharge Instructions (Signed)
Diverticulosis/ Diverticulitis Information and Diet:   Diverticulosis is a condition in which small, bulging pouches (diverticuli) form inside the lower part of the intestine, usually in the colon. Constipation and straining during bowel movements can worsen the condition. A diet rich in fiber can help keep stools soft and prevent inflammation.  Diverticulitis occurs when the pouches in the colon become infected or inflamed. Dietary changes can help the colon heal.  Fiber is an important part of the diet for patients with diverticulosis. A high-fiber diet softens and gives bulk to the stool, allowing it to pass quickly and easily.  Diet for Diverticulosis Eat a high-fiber diet when you have diverticulosis. Fiber softens the stool and helps prevent constipation. It also can help decrease pressure in the colon and help prevent flare-ups of diverticulitis.  High-fiber foods include:  Beans and legumes Bran, whole wheat bread and whole grain cereals such as oatmeal Brown and wild rice Fruits such as apples, bananas and pears Vegetables such as broccoli, carrots, corn and squash Whole wheat pasta If you currently don't have a diet high in fiber, you should add fiber gradually. This helps avoid bloating and abdominal discomfort. The target is to eat 25 to 30 grams of fiber daily. Drink at least 8 cups of fluid daily. Fluid will help soften your stool. Exercise also promotes bowel movement and helps prevent constipation.  When the colon is not inflamed, eat popcorn, nuts and seeds as tolerated.  Diet for Diverticulitis During flare ups of diverticulitis, follow a clear liquid diet. Your doctor will let you know when to progress from clear liquids to low fiber solids and then back to your normal diet.  A clear liquid diet means no solid foods. Juices should have no pulp. During the clear liquid diet, you may consume:  Broth Clear juices such as apple, cranberry and grape. (Avoid orange  juice) Jell-O Popsicles  When you're able to eat solid food, choose low fiber foods while healing. Low fiber foods include:  Canned or cooked fruit without seeds or skin, such as applesauce and melon Canned or well cooked vegetables without seeds and skin Dairy products such as cheese, milk and yogurt Eggs Low-fiber cereal Meat that is ground or tender and well cooked Pasta White bread and white rice AVOID RED MEAT WHILE YOU HAVE AN ACTIVE FLARE.  AVOID NSAIDS, IBUPROFEN, ALEVE, ASPIRIN WHILE YOU HAVE AN ACTIVE FLARE.  EXERCISE AS WELL AS SMOKING CESSATION CAN HELP PREVENT RECURRENCES.   After symptoms improve, usually within two to four days, you may add 5 to 15 grams of fiber a day back into your diet. Resume your high fiber diet when you no longer have symptoms.  

## 2020-03-25 NOTE — Progress Notes (Signed)
[facility] Progress Note     Subjective: Doing better but still with some discomfort. Overall improving. Has not had more than full liquids. No further vomiting.   Objective: Vital signs in last 24 hours: Temp:  [98.2 F (36.8 C)-98.4 F (36.9 C)] 98.4 F (36.9 C) (08/26 0428) Pulse Rate:  [73-82] 78 (08/26 0428) Resp:  [16-20] 20 (08/26 0428) BP: (98-107)/(63-71) 98/63 (08/26 0428) SpO2:  [95 %-98 %] 96 % (08/26 0758) Last BM Date: 03/24/20  Intake/Output from previous day: 08/25 0701 - 08/26 0700 In: 200.9 [I.V.:200.9] Out: -  Intake/Output this shift: No intake/output data recorded.  General appearance: alert, cooperative and no distress Resp: normal work of breathing GI: soft, nondistended, mildly tender lower abdomen  Lab Results:  Recent Labs    03/24/20 0547 03/25/20 0810  WBC 11.7* 9.7  HGB 13.1 13.4  HCT 39.6 40.4  PLT 187 201   BMET Recent Labs    03/24/20 0547 03/25/20 0810  NA 138 139  K 3.7 3.6  CL 102 102  CO2 28 29  GLUCOSE 82 92  BUN 6* <5*  CREATININE 0.79 0.76  CALCIUM 8.6* 8.9   Anti-infectives: Anti-infectives (From admission, onward)   Start     Dose/Rate Route Frequency Ordered Stop   03/22/20 1430  piperacillin-tazobactam (ZOSYN) IVPB 3.375 g        3.375 g 12.5 mL/hr over 240 Minutes Intravenous Every 8 hours 03/22/20 1421     03/22/20 0930  piperacillin-tazobactam (ZOSYN) IVPB 3.375 g        3.375 g 100 mL/hr over 30 Minutes Intravenous  Once 03/22/20 0915 03/22/20 1014      Assessment/Plan: Ms. Roulhac is a 59 yo with perforated diverticulitis without abscess. Doing better. Feeling better. Starting soft diet today. Pain controlled with Roxi. Home in AM. Will see in office 9/2 to check on. Will need outpatient colonoscopy.   Discussed with Dr. Starla Link.    LOS: 3 days    Virl Cagey 03/25/2020

## 2020-03-26 ENCOUNTER — Telehealth: Payer: Self-pay | Admitting: Family Medicine

## 2020-03-26 DIAGNOSIS — D72829 Elevated white blood cell count, unspecified: Secondary | ICD-10-CM

## 2020-03-26 LAB — BASIC METABOLIC PANEL
Anion gap: 6 (ref 5–15)
BUN: 5 mg/dL — ABNORMAL LOW (ref 8–23)
CO2: 27 mmol/L (ref 22–32)
Calcium: 8.9 mg/dL (ref 8.9–10.3)
Chloride: 107 mmol/L (ref 98–111)
Creatinine, Ser: 0.81 mg/dL (ref 0.44–1.00)
GFR calc Af Amer: >60 mL/min (ref >60)
GFR calc non Af Amer: >60 mL/min (ref >60)
Glucose, Bld: 93 mg/dL (ref 70–99)
Potassium: 3.6 mmol/L (ref 3.5–5.1)
Sodium: 140 mmol/L (ref 135–145)

## 2020-03-26 LAB — CBC
HCT: 37.8 % (ref 36.0–46.0)
Hemoglobin: 12.5 g/dL (ref 12.0–15.0)
MCH: 29.8 pg (ref 26.0–34.0)
MCHC: 33.1 g/dL (ref 30.0–36.0)
MCV: 90 fL (ref 80.0–100.0)
Platelets: 181 10*3/uL (ref 150–400)
RBC: 4.2 MIL/uL (ref 3.87–5.11)
RDW: 14.5 % (ref 11.5–15.5)
WBC: 7.1 10*3/uL (ref 4.0–10.5)
nRBC: 0 % (ref 0.0–0.2)

## 2020-03-26 LAB — C-REACTIVE PROTEIN: CRP: 2.1 mg/dL — ABNORMAL HIGH (ref <1.0)

## 2020-03-26 LAB — MAGNESIUM: Magnesium: 2.1 mg/dL (ref 1.7–2.4)

## 2020-03-26 MED ORDER — METRONIDAZOLE 500 MG PO TABS: 500.0000 mg | ORAL_TABLET | Freq: Three times a day (TID) | ORAL | 0 refills | Status: DC

## 2020-03-26 MED ORDER — OXYCODONE HCL 5 MG PO TABS: 5.0000 mg | ORAL_TABLET | Freq: Four times a day (QID) | ORAL | 0 refills | Status: DC | PRN

## 2020-03-26 MED ORDER — CEFPODOXIME PROXETIL 200 MG PO TABS: 200.0000 mg | ORAL_TABLET | Freq: Two times a day (BID) | ORAL | 0 refills | Status: DC

## 2020-03-26 MED ORDER — ONDANSETRON HCL 4 MG PO TABS: 4.0000 mg | ORAL_TABLET | Freq: Four times a day (QID) | ORAL | 0 refills | Status: DC | PRN

## 2020-03-26 MED ORDER — AMOXICILLIN-POT CLAVULANATE 875-125 MG PO TABS: 1.0000 | ORAL_TABLET | Freq: Two times a day (BID) | ORAL | 0 refills | Status: AC

## 2020-03-26 MED ORDER — CIPROFLOXACIN HCL 500 MG PO TABS: 500.0000 mg | ORAL_TABLET | Freq: Two times a day (BID) | ORAL | 0 refills | Status: DC

## 2020-03-26 NOTE — Telephone Encounter (Signed)
Hosp. F/u appt made for 04/06/20 at 410 with Dr. Livia Snellen, offered sooner appt with Hendricks Limes, FNP patient declined and states she would like to wait to see Dr. Livia Snellen.

## 2020-03-26 NOTE — Discharge Summary (Signed)
Physician Discharge Summary  Sarah LANGELIER JQB:[id number] DOB: 01/02/1961 DOA: 03/22/2020  PCP: Claretta Fraise, MD  Admit date: 03/22/2020 Discharge date: 03/26/2020  Admitted From: Home Disposition: Home  Recommendations for Outpatient Follow-up:  1. Follow up with PCP in 1 week  2. Outpatient follow-up with general surgery/Dr. Constance Haw in a week 3. Patient will need outpatient colonoscopy in 6 to 8 weeks time 4. Follow up in ED if symptoms worsen or new appear   Home Health: No Equipment/Devices: None  Discharge Condition: Stable CODE STATUS: Full Diet recommendation: Heart healthy  Brief/Interim Summary: 59 year old female with prior history of diverticulitis treated outpatient with antibiotics developed lower abdominal pain with nausea vomiting. Initially prescribed Cipro and Flagyl by her PCP without any help. CT on presentation showed sigmoid diverticulitis with microperforation/abscess.  She was started on broad-spectrum antibiotics.  General surgery was consulted.  She was treated conservatively.  During hospitalization, her symptoms gradually improved.  Her diet was gradually advanced.  She is currently afebrile with improving pain.  General surgery has cleared the patient for discharge.  She will be discharged home on oral Augmentin with outpatient follow-up with general surgery next week.  Discharge Diagnoses:   Acute sigmoid diverticulitis with microperforation and abscess Leukocytosis -General surgery evaluated the patient during the hospitalization.  Treated with intravenous Zosyn. Diet advancement as per general surgery.   -Her diet was gradually advanced.  She is currently afebrile with improving pain.  General surgery has cleared the patient for discharge.  She will be discharged home on oral Augmentin for 10 more days with outpatient follow-up with general surgery next week. -Leukocytosis has resolved - will need outpatient colonoscopy in 6 to 8 weeks time after  discharge   Hypokalemia -Improved.    essential hypertension -Blood pressure stable.  Resume home regimen.  history of asthma -Stable.  Outpatient follow-up.  hypothyroidism -continue Synthroid  GERD -Continue PPI.  Discharge Instructions  Discharge Instructions    Diet - low sodium heart healthy   Complete by: As directed    Increase activity slowly   Complete by: As directed      Allergies as of 03/26/2020      Reactions   Aspirin Swelling   Swelling of mouth and throat   Ace Inhibitors Other (See Comments)   Per IO:XBDZHGD   Actos [pioglitazone Hydrochloride] Other (See Comments)   Per pt: unknown   Alpha-gal Other (See Comments)   Ciprofloxacin Other (See Comments)   Unknown reaction   Crestor [rosuvastatin Calcium] Other (See Comments)   Per pt: unknown   Pneumococcal Vaccines Rash   Sulfonamide Derivatives Other (See Comments)   Per pt: unknown      Medication List    STOP taking these medications   naproxen 500 MG tablet Commonly known as: Naprosyn   potassium chloride 10 MEQ tablet Commonly known as: KLOR-CON     TAKE these medications   albuterol 108 (90 Base) MCG/ACT inhaler Commonly known as: VENTOLIN HFA TAKE 2 PUFFS BY MOUTH EVERY 6 HOURS AS NEEDED   amoxicillin-clavulanate 875-125 MG tablet Commonly known as: Augmentin Take 1 tablet by mouth every 12 (twelve) hours for 10 days.   benzonatate 100 MG capsule Commonly known as: TESSALON Take 1 capsule by mouth every 6 (six) hours as needed.   cetirizine 10 MG tablet Commonly known as: ZYRTEC Take 10 mg by mouth daily.   diclofenac Sodium 1 % Gel Commonly known as: Voltaren Apply 2 g topically 4 (four) times daily.  EPINEPHrine 0.3 mg/0.3 mL Soaj injection Commonly known as: EPI-PEN Inject 0.3 mLs (0.3 mg total) into the muscle as needed for anaphylaxis.   esomeprazole 40 MG capsule Commonly known as: NexIUM Take 1 capsule (40 mg total) by mouth as needed.    famotidine 20 MG tablet Commonly known as: PEPCID Take 1 tablet (20 mg total) by mouth 2 (two) times daily.   Fluticasone-Salmeterol 100-50 MCG/DOSE Aepb Commonly known as: Advair Diskus TAKE 1 PUFF BY MOUTH TWICE A DAY   furosemide 40 MG tablet Commonly known as: LASIX Take 1 tablet (40 mg total) by mouth daily as needed.   hydrOXYzine 50 MG tablet Commonly known as: ATARAX/VISTARIL Take 1 tablet (50 mg total) by mouth 3 (three) times daily as needed.   hyoscyamine 0.125 MG SL tablet Commonly known as: LEVSIN SL Place 1 tablet (0.125 mg total) under the tongue as needed. Place 1-2 tablet under the tongue until it dissolves as needed   icosapent Ethyl 1 g capsule Commonly known as: Vascepa Take 1 capsule (1 g total) by mouth 2 (two) times daily.   levothyroxine 125 MCG tablet Commonly known as: SYNTHROID Take 1 tablet (125 mcg total) by mouth daily.   losartan-hydrochlorothiazide 100-12.5 MG tablet Commonly known as: HYZAAR Take 1 tablet by mouth daily. as directed   methocarbamol 500 MG tablet Commonly known as: ROBAXIN Take 500 mg by mouth every 8 (eight) hours as needed.   montelukast 10 MG tablet Commonly known as: SINGULAIR TAKE 1 TABLET BY MOUTH EVERYDAY AT BEDTIME   ondansetron 4 MG tablet Commonly known as: ZOFRAN Take 1 tablet (4 mg total) by mouth every 6 (six) hours as needed for nausea. What changed:   when to take this  reasons to take this   oxyCODONE 5 MG immediate release tablet Commonly known as: Oxy IR/ROXICODONE Take 1 tablet (5 mg total) by mouth every 6 (six) hours as needed for moderate pain.   rosuvastatin 20 MG tablet Commonly known as: CRESTOR TAKE 1 TABLET (20 MG TOTAL) BY MOUTH DAILY.   Vitamin D (Ergocalciferol) 1.25 MG (50000 UNIT) Caps capsule Commonly known as: DRISDOL Take 1 capsule (50,000 Units total) by mouth every 7 (seven) days.       Follow-up Information    Virl Cagey, MD Follow up on 04/01/2020.    Specialty: General Surgery Why: follow up after diverticulitis  Contact information: [address] [place] [facility] [address] [phone]        Claretta Fraise, MD. Schedule an appointment as soon as possible for a visit in 1 week(s).   Specialty: Family Medicine Contact information: [address] [place] [address] [phone]              Allergies  Allergen Reactions  . Aspirin Swelling    Swelling of mouth and throat  . Ace Inhibitors Other (See Comments)    Per QQ:IWLNLGX  . Actos [Pioglitazone Hydrochloride] Other (See Comments)    Per pt: unknown  . Alpha-Gal Other (See Comments)  . Ciprofloxacin Other (See Comments)    Unknown reaction  . Crestor [Rosuvastatin Calcium] Other (See Comments)    Per pt: unknown  . Pneumococcal Vaccines Rash  . Sulfonamide Derivatives Other (See Comments)    Per pt: unknown    Consultations:  General surgery   Procedures/Studies: CT ABDOMEN PELVIS W CONTRAST  Result Date: 03/22/2020 CLINICAL DATA:  Abdominal pain, history of diverticulitis EXAM: CT ABDOMEN AND PELVIS WITH CONTRAST TECHNIQUE: Multidetector CT imaging of the  abdomen and pelvis was performed using the standard protocol following bolus administration of intravenous contrast. CONTRAST:  160mL OMNIPAQUE IOHEXOL 300 MG/ML  SOLN COMPARISON:  2015 FINDINGS: Lower chest: No acute abnormality. Hepatobiliary: No focal liver abnormality is seen. No gallstones, gallbladder wall thickening, or biliary dilatation. Pancreas: Unremarkable. Spleen: Similar nonspecific small low-density lesion. Otherwise unremarkable. Adrenals/Urinary Tract: Adrenals, kidneys, and bladder are unremarkable. Stomach/Bowel: Bowel is normal in caliber. There is extensive distal colonic diverticulosis. There are inflammatory changes about the sigmoid colon with an adjacent air and fluid containing collection measuring approximately 2.5 x 2.9 x 1.6 cm. Small hiatal hernia. Normal appendix.  Vascular/Lymphatic: Minimal aortic atherosclerosis. No enlarged lymph nodes identified. Reproductive: Anteverted uterus. Above inflammatory changes are along the dorsal aspect of the uterine body. No adnexal mass. Other: Abdominal wall is unremarkable. Musculoskeletal: No acute osseous abnormality. IMPRESSION: Acute sigmoid diverticulitis with microperforation and small abscess formation between the sigmoid and uterus. These results were called by telephone at the time of interpretation on 03/22/2020 at 8:57 am to provider Dr. Sabra Heck, who verbally acknowledged these results. Electronically Signed   By: Macy Mis M.D.   On: 03/22/2020 08:58       Subjective: Patient seen and examined at bedside.  Feels much better and is tolerating diet.  Still having intermittent abdominal pain but much improved.  No overnight fever or vomiting.  Discharge Exam: Vitals:   03/26/20 0635 03/26/20 0730  BP: 123/75   Pulse: 69   Resp: 14   Temp: 98.2 F (36.8 C)   SpO2: 95% 95%    General: Pt is alert, awake, not in acute distress Cardiovascular: rate controlled, S1/S2 + Respiratory: bilateral decreased breath sounds at bases Abdominal: Soft, mild lower quadrant tenderness, ND, bowel sounds + Extremities: no edema, no cyanosis    The results of significant diagnostics from this hospitalization (including imaging, microbiology, ancillary and laboratory) are listed below for reference.     Microbiology: Recent Results (from the past 240 hour(s))  SARS Coronavirus 2 by RT PCR (hospital order, performed in [facility] hospital lab) Nasopharyngeal Nasopharyngeal Swab     Status: None   Collection Time: 03/22/20 10:27 AM   Specimen: Nasopharyngeal Swab  Result Value Ref Range Status   SARS Coronavirus 2 NEGATIVE NEGATIVE Final    Comment: (NOTE) SARS-CoV-2 target nucleic acids are NOT DETECTED.  The SARS-CoV-2 RNA is generally detectable in upper and lower respiratory specimens during the acute  phase of infection. The lowest concentration of SARS-CoV-2 viral copies this assay can detect is 250 copies / mL. A negative result does not preclude SARS-CoV-2 infection and should not be used as the sole basis for treatment or other patient management decisions.  A negative result may occur with improper specimen collection / handling, submission of specimen other than nasopharyngeal swab, presence of viral mutation(s) within the areas targeted by this assay, and inadequate number of viral copies (<250 copies / mL). A negative result must be combined with clinical observations, patient history, and epidemiological information.  Fact Sheet for Patients:   StrictlyIdeas.no  Fact Sheet for Healthcare Providers: BankingDealers.co.za  This test is not yet approved or  cleared by the [place] FDA and has been authorized for detection and/or diagnosis of SARS-CoV-2 by FDA under an Emergency Use Authorization (EUA).  This EUA will remain in effect (meaning this test can be used) for the duration of the COVID-19 declaration under Section 564(b)(1) of the Act, 21 U.S.C. section 360bbb-3(b)(1), unless the authorization is terminated  or revoked sooner.  Performed at [facility], [address], [place], [place] [address]      Labs: BNP (last 3 results) No results for input(s): BNP in the last 8760 hours. Basic Metabolic Panel: Recent Labs  Lab 03/22/20 0631 03/23/20 0539 03/24/20 0547 03/25/20 0810 03/26/20 0738  NA 135 138 138 139 140  K 2.6* 3.3* 3.7 3.6 3.6  CL 94* 97* 102 102 107  CO2 28 30 28 29 27   GLUCOSE 104* 77 82 92 93  BUN 13 8 6* <5* 5*  CREATININE 1.05* 0.93 0.79 0.76 0.81  CALCIUM 9.5 8.8* 8.6* 8.9 8.9  MG  --   --  2.2 2.1 2.1   Liver Function Tests: Recent Labs  Lab 03/22/20 0631 03/23/20 0539 03/24/20 0547 03/25/20 0810  AST 27 15 12* 15  ALT 26 19 16 16   ALKPHOS 68 55 46 47  BILITOT 0.8 0.7 0.6 0.6   PROT 7.8 6.6 6.0* 6.6  ALBUMIN 4.2 3.4* 3.1* 3.4*   No results for input(s): LIPASE, AMYLASE in the last 168 hours. No results for input(s): AMMONIA in the last 168 hours. CBC: Recent Labs  Lab 03/22/20 0631 03/23/20 0539 03/24/20 0547 03/25/20 0810 03/26/20 0738  WBC 16.6* 11.8* 11.7* 9.7 7.1  NEUTROABS 12.6*  --   --   --   --   HGB 15.7* 14.1 13.1 13.4 12.5  HCT 46.2* 42.4 39.6 40.4 37.8  MCV 87.2 89.8 89.6 90.0 90.0  PLT 213 193 187 201 181   Cardiac Enzymes: No results for input(s): CKTOTAL, CKMB, CKMBINDEX, TROPONINI in the last 168 hours. BNP: Invalid input(s): POCBNP CBG: No results for input(s): GLUCAP in the last 168 hours. D-Dimer No results for input(s): DDIMER in the last 72 hours. Hgb A1c No results for input(s): HGBA1C in the last 72 hours. Lipid Profile No results for input(s): CHOL, HDL, LDLCALC, TRIG, CHOLHDL, LDLDIRECT in the last 72 hours. Thyroid function studies No results for input(s): TSH, T4TOTAL, T3FREE, THYROIDAB in the last 72 hours.  Invalid input(s): FREET3 Anemia work up No results for input(s): VITAMINB12, FOLATE, FERRITIN, TIBC, IRON, RETICCTPCT in the last 72 hours. Urinalysis    Component Value Date/Time   COLORURINE YELLOW 03/22/2020 0620   APPEARANCEUR CLEAR 03/22/2020 0620   APPEARANCEUR Clear 10/19/2015 1527   LABSPEC 1.010 03/22/2020 0620   PHURINE 5.5 03/22/2020 0620   GLUCOSEU NEGATIVE 03/22/2020 0620   HGBUR NEGATIVE 03/22/2020 0620   BILIRUBINUR NEGATIVE 03/22/2020 0620   BILIRUBINUR Negative 10/19/2015 1527   KETONESUR NEGATIVE 03/22/2020 0620   PROTEINUR NEGATIVE 03/22/2020 0620   UROBILINOGEN negative 08/22/2013 0945   NITRITE POSITIVE (A) 03/22/2020 0620   LEUKOCYTESUR TRACE (A) 03/22/2020 0620   Sepsis Labs Invalid input(s): PROCALCITONIN,  WBC,  LACTICIDVEN Microbiology Recent Results (from the past 240 hour(s))  SARS Coronavirus 2 by RT PCR (hospital order, performed in [place] hospital lab)  Nasopharyngeal Nasopharyngeal Swab     Status: None   Collection Time: 03/22/20 10:27 AM   Specimen: Nasopharyngeal Swab  Result Value Ref Range Status   SARS Coronavirus 2 NEGATIVE NEGATIVE Final    Comment: (NOTE) SARS-CoV-2 target nucleic acids are NOT DETECTED.  The SARS-CoV-2 RNA is generally detectable in upper and lower respiratory specimens during the acute phase of infection. The lowest concentration of SARS-CoV-2 viral copies this assay can detect is 250 copies / mL. A negative result does not preclude SARS-CoV-2 infection and should not be used as the sole basis for treatment  or other patient management decisions.  A negative result may occur with improper specimen collection / handling, submission of specimen other than nasopharyngeal swab, presence of viral mutation(s) within the areas targeted by this assay, and inadequate number of viral copies (<250 copies / mL). A negative result must be combined with clinical observations, patient history, and epidemiological information.  Fact Sheet for Patients:   StrictlyIdeas.no  Fact Sheet for Healthcare Providers: BankingDealers.co.za  This test is not yet approved or  cleared by the [place] FDA and has been authorized for detection and/or diagnosis of SARS-CoV-2 by FDA under an Emergency Use Authorization (EUA).  This EUA will remain in effect (meaning this test can be used) for the duration of the COVID-19 declaration under Section 564(b)(1) of the Act, 21 U.S.C. section 360bbb-3(b)(1), unless the authorization is terminated or revoked sooner.  Performed at [facility], [address], [place], [place] [address]      Time coordinating discharge: 35 minutes  SIGNED:   Aline August, MD  [facility] 03/26/2020, 10:48 AM

## 2020-04-01 ENCOUNTER — Other Ambulatory Visit: Payer: Self-pay

## 2020-04-01 ENCOUNTER — Ambulatory Visit: Payer: BC Managed Care – PPO | Admitting: General Surgery

## 2020-04-01 ENCOUNTER — Encounter: Payer: Self-pay | Admitting: General Surgery

## 2020-04-01 VITALS — BP 119/78 | HR 77 | Temp 98.3°F | Resp 16 | Ht 63.0 in | Wt 181.0 lb

## 2020-04-01 DIAGNOSIS — K5700 Diverticulitis of small intestine with perforation and abscess without bleeding: Secondary | ICD-10-CM

## 2020-04-01 NOTE — Patient Instructions (Signed)
Referral GI for colonoscopy.   Diverticulosis/ Diverticulitis Information and Diet:   Diverticulosis is a condition in which small, bulging pouches (diverticuli) form inside the lower part of the intestine, usually in the colon. Constipation and straining during bowel movements can worsen the condition. A diet rich in fiber can help keep stools soft and prevent inflammation.  Diverticulitis occurs when the pouches in the colon become infected or inflamed. Dietary changes can help the colon heal.  Fiber is an important part of the diet for patients with diverticulosis. A high-fiber diet softens and gives bulk to the stool, allowing it to pass quickly and easily.  Diet for Diverticulosis Eat a high-fiber diet when you have diverticulosis. Fiber softens the stool and helps prevent constipation. It also can help decrease pressure in the colon and help prevent flare-ups of diverticulitis.  High-fiber foods include:  Beans and legumes Bran, whole wheat bread and whole grain cereals such as oatmeal Brown and wild rice Fruits such as apples, bananas and pears Vegetables such as broccoli, carrots, corn and squash Whole wheat pasta If you currently don't have a diet high in fiber, you should add fiber gradually. This helps avoid bloating and abdominal discomfort. The target is to eat 25 to 30 grams of fiber daily. Drink at least 8 cups of fluid daily. Fluid will help soften your stool. Exercise also promotes bowel movement and helps prevent constipation.  When the colon is not inflamed, eat popcorn, nuts and seeds as tolerated.  Diet for Diverticulitis During flare ups of diverticulitis, follow a clear liquid diet. Your doctor will let you know when to progress from clear liquids to low fiber solids and then back to your normal diet.  A clear liquid diet means no solid foods. Juices should have no pulp. During the clear liquid diet, you may consume:  Broth Clear juices such as apple, cranberry  and grape. (Avoid orange juice) Jell-O Popsicles  When you're able to eat solid food, choose low fiber foods while healing. Low fiber foods include:  Canned or cooked fruit without seeds or skin, such as applesauce and melon Canned or well cooked vegetables without seeds and skin Dairy products such as cheese, milk and yogurt Eggs Low-fiber cereal Meat that is ground or tender and well cooked Pasta White bread and white rice AVOID RED MEAT WHILE YOU HAVE AN ACTIVE FLARE.  AVOID NSAIDS, IBUPROFEN, ALEVE, ASPIRIN WHILE YOU HAVE AN ACTIVE FLARE.  EXERCISE AS WELL AS SMOKING CESSATION CAN HELP PREVENT RECURRENCES.   After symptoms improve, usually within two to four days, you may add 5 to 15 grams of fiber a day back into your diet. Resume your high fiber diet when you no longer have symptoms.

## 2020-04-01 NOTE — Progress Notes (Signed)
[facility] Note   HPI:  59 y.o. Female presents to clinic for follow-up evaluation of perforated diverticulitis. She is doing much better and her pain is improved. She is finishing up her antibiotics. She has a perforation but no drain was required. She has had prior diverticulitis managed as outpaient. Last colonoscopy 2013.   Review of Systems:  Tolerating diet Having BMs Improving pain  All other review of systems: otherwise negative   Vital Signs:  BP 119/78   Pulse 77   Temp 98.3 F (36.8 C) (Oral)   Resp 16   Ht 5\' 3"  (1.6 m)   Wt 181 lb (82.1 kg)   LMP 11/29/2011   SpO2 94%   BMI 32.06 kg/m    Physical Exam:  Physical Exam Vitals reviewed.  Cardiovascular:     Rate and Rhythm: Normal rate.  Pulmonary:     Effort: Pulmonary effort is normal.  Abdominal:     General: There is no distension.     Palpations: Abdomen is soft.     Tenderness: There is no abdominal tenderness.  Neurological:     Mental Status: She is alert.      Assessment:  59 y.o. yo Female with recent perforated diverticulitis improving on antibiotics. Discussed need for repeat colonoscopy given that it has been 8 years. Discussed that she wants to pursue that closer to home as she did [place] prior.   Plan:  - Referral to GI for colonoscopy after diverticulitis   - Discussed individualized course and recommendation after perforated diverticulitis and potential need for surgery in the future. Discussed that we will continue to discuss this option pending her symptoms and colonoscopy. - Finish up antibiotics.  -Will see in a few weeks.   Future Appointments  Date Time Provider [place]  04/06/2020  4:10 PM Claretta Fraise, MD [facility] None  05/07/2020  8:30 AM [facility] None  05/07/2020  8:45 AM [place], Holli Humbles, NP [facility] None  05/13/2020  1:45 PM Virl Cagey, MD [facility] None    All of the above recommendations were discussed with the patient and  patient's family, and all of patient's and family's questions were answered to their expressed satisfaction.  Curlene Labrum, MD [facility] [address] [place], [place] [address] [phone] (office)

## 2020-04-06 ENCOUNTER — Other Ambulatory Visit: Payer: Self-pay

## 2020-04-06 ENCOUNTER — Encounter: Payer: Self-pay | Admitting: Family Medicine

## 2020-04-06 ENCOUNTER — Ambulatory Visit (INDEPENDENT_AMBULATORY_CARE_PROVIDER_SITE_OTHER): Payer: BC Managed Care – PPO | Admitting: Family Medicine

## 2020-04-06 VITALS — BP 129/83 | HR 80 | Temp 98.0°F | Ht 63.0 in | Wt 179.8 lb

## 2020-04-06 DIAGNOSIS — Z91018 Allergy to other foods: Secondary | ICD-10-CM

## 2020-04-06 DIAGNOSIS — E032 Hypothyroidism due to medicaments and other exogenous substances: Secondary | ICD-10-CM | POA: Diagnosis not present

## 2020-04-06 DIAGNOSIS — K5732 Diverticulitis of large intestine without perforation or abscess without bleeding: Secondary | ICD-10-CM

## 2020-04-06 NOTE — Progress Notes (Signed)
Subjective:  Patient ID: Sarah Garrett, female    DOB: [birth year]  Age: 59 y.o. MRN: [id number]  CC: Hospitalization Follow-up   HPI Sarah Garrett presents for recheck following hospitalization.  Her stomach pain is better.  However she still tired.  She tried to go back to work today.  She has had no right diarrhea since September 1.  Bowel movements are now normal.  Depression screen [facility] 2/9 04/06/2020 09/26/2019 08/20/2019  Decreased Interest 0 0 0  Down, Depressed, Hopeless 0 0 0  PHQ - 2 Score 0 0 0    History Sarah Garrett has a past medical history of Anemia, Diverticulosis, GERD (gastroesophageal reflux disease), H/O adenoidectomy, Hemochromatosis (07/07/2011), Hyperlipidemia, Hypertension, Melanoma ([place]), and Thyroid disease.   She has a past surgical history that includes Tonsillectomy and adenoidectomy; Breast biopsy (2004); and Melanoma excision (09/28/11).   Her family history includes Heart disease in her mother.She reports that she quit smoking about 42 years ago. Her smoking use included cigarettes. She started smoking about 45 years ago. She has a 0.75 pack-year smoking history. She has never used smokeless tobacco. She reports that she does not drink alcohol and does not use drugs.    ROS Review of Systems  Constitutional: Negative.   HENT: Negative.   Eyes: Negative for visual disturbance.  Respiratory: Negative for shortness of breath.   Cardiovascular: Negative for chest pain.  Gastrointestinal: Positive for abdominal distention and abdominal pain. Negative for diarrhea.  Musculoskeletal: Negative for arthralgias.    Objective:  BP 129/83   Pulse 80   Temp 98 F (36.7 C) (Temporal)   Ht 5' 3" (1.6 m)   Wt 179 lb 12.8 oz (81.6 kg)   LMP 11/29/2011   BMI 31.85 kg/m   BP Readings from Last 3 Encounters:  04/06/20 129/83  04/01/20 119/78  03/26/20 123/75    Wt Readings from Last 3 Encounters:  04/06/20 179 lb 12.8 oz (81.6 kg)  04/01/20 181 lb  (82.1 kg)  03/22/20 184 lb 15.5 oz (83.9 kg)     Physical Exam Constitutional:      General: She is not in acute distress.    Appearance: She is well-developed.  HENT:     Head: Normocephalic and atraumatic.  Eyes:     Conjunctiva/sclera: Conjunctivae normal.     Pupils: Pupils are equal, round, and reactive to light.  Neck:     Thyroid: No thyromegaly.  Cardiovascular:     Rate and Rhythm: Normal rate and regular rhythm.     Heart sounds: Normal heart sounds. No murmur heard.   Pulmonary:     Effort: Pulmonary effort is normal. No respiratory distress.     Breath sounds: Normal breath sounds. No wheezing or rales.  Abdominal:     General: Bowel sounds are normal. There is no distension.     Palpations: Abdomen is soft.     Tenderness: There is no abdominal tenderness.  Musculoskeletal:        General: Normal range of motion.     Cervical back: Normal range of motion and neck supple.  Lymphadenopathy:     Cervical: No cervical adenopathy.  Skin:    General: Skin is warm and dry.  Neurological:     Mental Status: She is alert and oriented to person, place, and time.  Psychiatric:        Behavior: Behavior normal.        Thought Content: Thought content normal.  Judgment: Judgment normal.       Assessment & Plan:   Sarah Garrett was seen today for hospitalization follow-up.  Diagnoses and all orders for this visit:  Sigmoid diverticulitis -     CBC with Differential/Platelet -     CMP14+EGFR  Allergy to alpha-gal -     Alpha-Gal Panel  Hypothyroidism due to medication -     CMP14+EGFR -     TSH + free T4       I have discontinued Sarah Garrett. Sarah Garrett's benzonatate. I am also having her maintain her cetirizine, hyoscyamine, EPINEPHrine, albuterol, esomeprazole, famotidine, Fluticasone-Salmeterol, furosemide, hydrOXYzine, icosapent Ethyl, losartan-hydrochlorothiazide, montelukast, rosuvastatin, Vitamin D (Ergocalciferol), diclofenac Sodium, methocarbamol,  oxyCODONE, and ondansetron.  Allergies as of 04/06/2020      Reactions   Aspirin Swelling   Swelling of mouth and throat   Ace Inhibitors Other (See Comments)   Per VX:[id number]   Actos [pioglitazone Hydrochloride] Other (See Comments)   Per pt: unknown   Alpha-gal Other (See Comments)   Ciprofloxacin Other (See Comments)   Unknown reaction   Crestor [rosuvastatin Calcium] Other (See Comments)   Per pt: unknown   Pneumococcal Vaccines Rash   Sulfonamide Derivatives Other (See Comments)   Per pt: unknown      Medication List       Accurate as of April 06, 2020 11:59 PM. If you have any questions, ask your nurse or doctor.        STOP taking these medications   benzonatate 100 MG capsule Commonly known as: TESSALON Stopped by: Claretta Fraise, MD     TAKE these medications   albuterol 108 (90 Base) MCG/ACT inhaler Commonly known as: VENTOLIN HFA TAKE 2 PUFFS BY MOUTH EVERY 6 HOURS AS NEEDED   cetirizine 10 MG tablet Commonly known as: ZYRTEC Take 10 mg by mouth daily.   diclofenac Sodium 1 % Gel Commonly known as: Voltaren Apply 2 g topically 4 (four) times daily.   EPINEPHrine 0.3 mg/0.3 mL Soaj injection Commonly known as: EPI-PEN Inject 0.3 mLs (0.3 mg total) into the muscle as needed for anaphylaxis.   esomeprazole 40 MG capsule Commonly known as: NexIUM Take 1 capsule (40 mg total) by mouth as needed.   famotidine 20 MG tablet Commonly known as: PEPCID Take 1 tablet (20 mg total) by mouth 2 (two) times daily.   Fluticasone-Salmeterol 100-50 MCG/DOSE Aepb Commonly known as: Advair Diskus TAKE 1 PUFF BY MOUTH TWICE A DAY   furosemide 40 MG tablet Commonly known as: LASIX Take 1 tablet (40 mg total) by mouth daily as needed.   hydrOXYzine 50 MG tablet Commonly known as: ATARAX/VISTARIL Take 1 tablet (50 mg total) by mouth 3 (three) times daily as needed.   hyoscyamine 0.125 MG SL tablet Commonly known as: LEVSIN SL Place 1 tablet (0.125 mg total)  under the tongue as needed. Place 1-2 tablet under the tongue until it dissolves as needed   icosapent Ethyl 1 g capsule Commonly known as: Vascepa Take 1 capsule (1 g total) by mouth 2 (two) times daily.   levothyroxine 125 MCG tablet Commonly known as: SYNTHROID Take 1 tablet (125 mcg total) by mouth daily.   losartan-hydrochlorothiazide 100-12.5 MG tablet Commonly known as: HYZAAR Take 1 tablet by mouth daily. as directed   methocarbamol 500 MG tablet Commonly known as: ROBAXIN Take 500 mg by mouth every 8 (eight) hours as needed.   montelukast 10 MG tablet Commonly known as: SINGULAIR TAKE 1 TABLET BY MOUTH EVERYDAY AT BEDTIME  ondansetron 4 MG tablet Commonly known as: ZOFRAN Take 1 tablet (4 mg total) by mouth every 6 (six) hours as needed for nausea.   oxyCODONE 5 MG immediate release tablet Commonly known as: Oxy IR/ROXICODONE Take 1 tablet (5 mg total) by mouth every 6 (six) hours as needed for moderate pain.   rosuvastatin 20 MG tablet Commonly known as: CRESTOR TAKE 1 TABLET (20 MG TOTAL) BY MOUTH DAILY.   Vitamin D (Ergocalciferol) 1.25 MG (50000 UNIT) Caps capsule Commonly known as: DRISDOL Take 1 capsule (50,000 Units total) by mouth every 7 (seven) days.      Work half days this week and next week before resuming normal duty.  Follow-up: Return in about 3 months (around 07/06/2020), or if symptoms worsen or fail to improve.  Claretta Fraise, M.D.

## 2020-04-07 ENCOUNTER — Encounter (INDEPENDENT_AMBULATORY_CARE_PROVIDER_SITE_OTHER): Payer: Self-pay | Admitting: Gastroenterology

## 2020-04-07 ENCOUNTER — Other Ambulatory Visit: Payer: Self-pay | Admitting: Family Medicine

## 2020-04-07 DIAGNOSIS — E032 Hypothyroidism due to medicaments and other exogenous substances: Secondary | ICD-10-CM

## 2020-04-07 DIAGNOSIS — M48061 Spinal stenosis, lumbar region without neurogenic claudication: Secondary | ICD-10-CM | POA: Diagnosis not present

## 2020-04-07 DIAGNOSIS — M5126 Other intervertebral disc displacement, lumbar region: Secondary | ICD-10-CM | POA: Diagnosis not present

## 2020-04-07 DIAGNOSIS — M4316 Spondylolisthesis, lumbar region: Secondary | ICD-10-CM | POA: Diagnosis not present

## 2020-04-07 DIAGNOSIS — M47816 Spondylosis without myelopathy or radiculopathy, lumbar region: Secondary | ICD-10-CM | POA: Diagnosis not present

## 2020-04-07 MED ORDER — LEVOTHYROXINE SODIUM 112 MCG PO TABS: 112.0000 ug | ORAL_TABLET | Freq: Every day | ORAL | 1 refills | Status: DC

## 2020-04-08 ENCOUNTER — Other Ambulatory Visit: Payer: Self-pay | Admitting: *Deleted

## 2020-04-08 DIAGNOSIS — E032 Hypothyroidism due to medicaments and other exogenous substances: Secondary | ICD-10-CM

## 2020-04-11 ENCOUNTER — Encounter: Payer: Self-pay | Admitting: Family Medicine

## 2020-04-13 LAB — CMP14+EGFR
ALT: 15 IU/L (ref 0–32)
AST: 22 IU/L (ref 0–40)
Albumin/Globulin Ratio: 1.7 (ref 1.2–2.2)
Albumin: 4.4 g/dL (ref 3.8–4.8)
Alkaline Phosphatase: 75 IU/L (ref 48–121)
BUN/Creatinine Ratio: 10 — ABNORMAL LOW (ref 12–28)
BUN: 7 mg/dL — ABNORMAL LOW (ref 8–27)
Bilirubin Total: 0.7 mg/dL (ref 0.0–1.2)
CO2: 30 mmol/L — ABNORMAL HIGH (ref 20–29)
Calcium: 10 mg/dL (ref 8.7–10.3)
Chloride: 97 mmol/L (ref 96–106)
Creatinine, Ser: 0.71 mg/dL (ref 0.57–1.00)
GFR calc Af Amer: 106 mL/min/{1.73_m2} (ref >59)
GFR calc non Af Amer: 92 mL/min/{1.73_m2} (ref >59)
Globulin, Total: 2.6 g/dL (ref 1.5–4.5)
Glucose: 101 mg/dL — ABNORMAL HIGH (ref 65–99)
Potassium: 3.1 mmol/L — ABNORMAL LOW (ref 3.5–5.2)
Sodium: 141 mmol/L (ref 134–144)
Total Protein: 7 g/dL (ref 6.0–8.5)

## 2020-04-13 LAB — ALPHA-GAL PANEL
Alpha Gal IgE*: 0.13 kU/L — ABNORMAL HIGH (ref <0.10)
Beef (Bos spp) IgE: 1.39 kU/L — ABNORMAL HIGH (ref <0.35)
Class Interpretation: 2
Class Interpretation: 2
Class Interpretation: 3
Lamb/Mutton (Ovis spp) IgE: 2.57 kU/L — ABNORMAL HIGH (ref <0.35)
Pork (Sus spp) IgE: 5.75 kU/L — ABNORMAL HIGH (ref <0.35)

## 2020-04-13 LAB — CBC WITH DIFFERENTIAL/PLATELET
Basophils Absolute: 0.1 10*3/uL (ref 0.0–0.2)
Basos: 1 %
EOS (ABSOLUTE): 0.3 10*3/uL (ref 0.0–0.4)
Eos: 4 %
Hematocrit: 44.6 % (ref 34.0–46.6)
Hemoglobin: 15.3 g/dL (ref 11.1–15.9)
Immature Grans (Abs): 0 10*3/uL (ref 0.0–0.1)
Immature Granulocytes: 0 %
Lymphocytes Absolute: 2.5 10*3/uL (ref 0.7–3.1)
Lymphs: 33 %
MCH: 30.5 pg (ref 26.6–33.0)
MCHC: 34.3 g/dL (ref 31.5–35.7)
MCV: 89 fL (ref 79–97)
Monocytes Absolute: 0.5 10*3/uL (ref 0.1–0.9)
Monocytes: 7 %
Neutrophils Absolute: 4.2 10*3/uL (ref 1.4–7.0)
Neutrophils: 55 %
Platelets: 216 10*3/uL (ref 150–450)
RBC: 5.01 x10E6/uL (ref 3.77–5.28)
RDW: 15.3 % (ref 11.7–15.4)
WBC: 7.6 10*3/uL (ref 3.4–10.8)

## 2020-04-13 LAB — TSH+FREE T4
Free T4: 1.78 ng/dL — ABNORMAL HIGH (ref 0.82–1.77)
TSH: 0.401 u[IU]/mL — ABNORMAL LOW (ref 0.450–4.500)

## 2020-04-14 ENCOUNTER — Telehealth: Payer: Self-pay | Admitting: Family Medicine

## 2020-04-14 NOTE — Telephone Encounter (Signed)
Pt returned missed call regarding lab results. Reviewed results with pt per Dr Livia Snellen notes. Pt voiced understanding. Pt scheduled to see Dr Livia Snellen for next visit on 07/06/20.

## 2020-04-27 DIAGNOSIS — M25561 Pain in right knee: Secondary | ICD-10-CM | POA: Diagnosis not present

## 2020-04-27 DIAGNOSIS — M545 Low back pain: Secondary | ICD-10-CM | POA: Diagnosis not present

## 2020-04-27 DIAGNOSIS — M5416 Radiculopathy, lumbar region: Secondary | ICD-10-CM | POA: Diagnosis not present

## 2020-05-03 ENCOUNTER — Encounter (INDEPENDENT_AMBULATORY_CARE_PROVIDER_SITE_OTHER): Payer: Self-pay | Admitting: *Deleted

## 2020-05-03 ENCOUNTER — Other Ambulatory Visit (INDEPENDENT_AMBULATORY_CARE_PROVIDER_SITE_OTHER): Payer: Self-pay | Admitting: *Deleted

## 2020-05-03 ENCOUNTER — Other Ambulatory Visit: Payer: Self-pay

## 2020-05-03 ENCOUNTER — Encounter (INDEPENDENT_AMBULATORY_CARE_PROVIDER_SITE_OTHER): Payer: Self-pay | Admitting: Gastroenterology

## 2020-05-03 ENCOUNTER — Ambulatory Visit (INDEPENDENT_AMBULATORY_CARE_PROVIDER_SITE_OTHER): Payer: BC Managed Care – PPO | Admitting: Gastroenterology

## 2020-05-03 ENCOUNTER — Telehealth (INDEPENDENT_AMBULATORY_CARE_PROVIDER_SITE_OTHER): Payer: Self-pay | Admitting: *Deleted

## 2020-05-03 VITALS — BP 123/78 | HR 90 | Temp 98.3°F | Ht 63.0 in | Wt 184.0 lb

## 2020-05-03 DIAGNOSIS — K5700 Diverticulitis of small intestine with perforation and abscess without bleeding: Secondary | ICD-10-CM

## 2020-05-03 MED ORDER — SUTAB 1479-225-188 MG PO TABS: 1.0000 | ORAL_TABLET | Freq: Once | ORAL | 0 refills | Status: AC

## 2020-05-03 NOTE — Telephone Encounter (Signed)
Patient needs Sutab (copay card) ° °

## 2020-05-03 NOTE — Patient Instructions (Signed)
Schedule liver elastography Schedule colonoscopy

## 2020-05-03 NOTE — Progress Notes (Signed)
Sarah Garrett, M.D. Gastroenterology & Hepatology [facility] [address] [place], [place] [address] Primary Care Physician: Claretta Fraise, MD [place] [address]  Referring MD: Curlene Labrum, MD  I will communicate my assessment and recommendations to the referring MD via EMR. Note: Occasional unusual wording and randomly placed punctuation marks may result from the use of speech recognition technology to transcribe this document"  Chief Complaint: Acute diverticulitis  History of Present Illness: Sarah Garrett is a 59 y.o. female with PMH alpha gal allergy, HLD, GERD, melanoma s/p resection, asthma, hypothyroidism, hemochromatosis, who presents for evaluation after recent episode of acute diverticulitis.  Patient reports that in mid August 2021 she presented new onset left lower quadrant abdominal pain and diarrhea.  Due to this, the patient was seen by her PCP who considered she had an episode of acute diverticulitis and was prescribed oral ciprofloxacin and Flagyl which she took for one week.  As she did not have any improvement, she decided to come to the hospital on 03/22/2020.  Patient was found to have leukocytosis of 16,000 with severe hypokalemia of 2.6.  CT of the abdomen with IV contrast showed presence of acute sigmoid diverticulitis with microperforation and small abscess.  Due to this, the patient was seen by general surgery who considered patient would benefit from medical management, she receive a 2-week course of antibiotics, initially IV but eventually was discharged on Augmentin.  The patient completed her antibiotic course compliantly.   She reports she is now having 1-2 BMs per day. She is feeling good at the moment, has very occasional discomfort in her mid abdomen but this is infrequent. Denies having any more diarrhea, nausea, vomiting, fever, chills, hematochezia, melena, hematemesis, abdominal  distention, abdominal pain, diarrhea, jaundice, pruritus or weight loss. She is not currently taking any medication. She has been trying to avoid broccoli, tomatoes or food with seeds, also meat.  She reports this is the fifth time she has had diverticulitis, but none of the previous times she had to be hospitalized.  She also states that only once in the past she had a CT scan to diagnose her condition and the other episodes she only "made dietary changes that improve her symptoms".  Notably, the patient has a history of hereditary hemochromatosis, for which she follows with hematologist at [facility].  She states that several family members have a history of hemochromatosis as well and the hematologist is taking care of all of them.  She has never had a history of chronically elevated liver enzymes in our records.  Most recent CMP showed normal AST 22 ALT 15, total bilirubin 0.7, alkaline phosphatase 95 and albumin 4.4.  She has previously required phlebotomies but not recently.  Last Colonoscopy:2013 - abnormal changes in IC valve - histology showed benign tissue, 4 mm polyp in proximal TC - lipoma in histology, diverticulosis worse in sigmoid colon  FHx: multiple family members with hemochromatosis, neg for any gastrointestinal/liver disease, 2nd degree family members with lung cancer Social: neg smoking, alcohol or illicit drug use Surgical: non contributory  Past Medical History: Past Medical History:  Diagnosis Date  . Anemia   . Diverticulosis   . GERD (gastroesophageal reflux disease)   . H/O adenoidectomy   . Hemochromatosis 07/07/2011  . Hyperlipidemia   . Hypertension   . Melanoma ([place])    right shoulder  . Thyroid disease     Past Surgical History: Past Surgical History:  Procedure Laterality Date  . BREAST BIOPSY  2004   left; benign  . MELANOMA EXCISION  09/28/11   right shoulder  . TONSILLECTOMY AND ADENOIDECTOMY      Family History: Family History  Problem  Relation Age of Onset  . Heart disease Mother        by pass  . Colon cancer Neg Hx   . Stomach cancer Neg Hx     Social History: Social History   Tobacco Use  Smoking Status Former Smoker  . Packs/day: 0.25  . Years: 3.00  . Pack years: 0.75  . Types: Cigarettes  . Start date: 08/16/1974  . Quit date: 01/14/1978  . Years since quitting: 42.3  Smokeless Tobacco Never Used  Tobacco Comment   quit 37 years ago   Social History   Substance and Sexual Activity  Alcohol Use No  . Alcohol/week: 0.0 standard drinks   Social History   Substance and Sexual Activity  Drug Use No    Allergies: Allergies  Allergen Reactions  . Aspirin Swelling    Swelling of mouth and throat  . Ace Inhibitors Other (See Comments)    Per [id number]  . Actos [Pioglitazone Hydrochloride] Other (See Comments)    Per pt: unknown  . Alpha-Gal Other (See Comments)  . Ciprofloxacin Other (See Comments)    Unknown reaction  . Crestor [Rosuvastatin Calcium] Other (See Comments)    Per pt: unknown  . Pneumococcal Vaccines Rash  . Sulfonamide Derivatives Other (See Comments)    Per pt: unknown    Medications: Current Outpatient Medications  Medication Sig Dispense Refill  . albuterol (VENTOLIN HFA) 108 (90 Base) MCG/ACT inhaler TAKE 2 PUFFS BY MOUTH EVERY 6 HOURS AS NEEDED 18 g 11  . cetirizine (ZYRTEC) 10 MG tablet Take 10 mg by mouth daily.      . diclofenac Sodium (VOLTAREN) 1 % GEL Apply 2 g topically 4 (four) times daily. 350 g 5  . esomeprazole (NEXIUM) 40 MG capsule Take 1 capsule (40 mg total) by mouth as needed. 30 capsule 6  . Fluticasone-Salmeterol (ADVAIR DISKUS) 100-50 MCG/DOSE AEPB TAKE 1 PUFF BY MOUTH TWICE A DAY 180 each 3  . furosemide (LASIX) 40 MG tablet Take 1 tablet (40 mg total) by mouth daily as needed. 90 tablet 3  . hydrOXYzine (ATARAX/VISTARIL) 50 MG tablet Take 1 tablet (50 mg total) by mouth 3 (three) times daily as needed. 30 tablet 0  . hyoscyamine (LEVSIN SL) 0.125  MG SL tablet Place 1 tablet (0.125 mg total) under the tongue as needed. Place 1-2 tablet under the tongue until it dissolves as needed 30 tablet 2  . icosapent Ethyl (VASCEPA) 1 g capsule Take 1 capsule (1 g total) by mouth 2 (two) times daily. 180 capsule 3  . levothyroxine (SYNTHROID) 112 MCG tablet Take 1 tablet (112 mcg total) by mouth daily. 90 tablet 1  . losartan-hydrochlorothiazide (HYZAAR) 100-12.5 MG tablet Take 1 tablet by mouth daily. as directed 90 tablet 3  . montelukast (SINGULAIR) 10 MG tablet TAKE 1 TABLET BY MOUTH EVERYDAY AT BEDTIME 90 tablet 3  . rosuvastatin (CRESTOR) 20 MG tablet TAKE 1 TABLET (20 MG TOTAL) BY MOUTH DAILY. 90 tablet 3  . Vitamin D, Ergocalciferol, (DRISDOL) 1.25 MG (50000 UNIT) CAPS capsule Take 1 capsule (50,000 Units total) by mouth every 7 (seven) days. 12 capsule 3  . EPINEPHrine 0.3 mg/0.3 mL IJ SOAJ injection Inject 0.3 mLs (0.3 mg total) into the muscle as needed for anaphylaxis. (  Patient not taking: Reported on 05/03/2020) 1 each 3  . famotidine (PEPCID) 20 MG tablet Take 1 tablet (20 mg total) by mouth 2 (two) times daily. (Patient not taking: Reported on 05/03/2020) 180 tablet 5  . methocarbamol (ROBAXIN) 500 MG tablet Take 500 mg by mouth every 8 (eight) hours as needed. (Patient not taking: Reported on 05/03/2020)    . ondansetron (ZOFRAN) 4 MG tablet Take 1 tablet (4 mg total) by mouth every 6 (six) hours as needed for nausea. (Patient not taking: Reported on 05/03/2020) 20 tablet 0  . oxyCODONE (OXY IR/ROXICODONE) 5 MG immediate release tablet Take 1 tablet (5 mg total) by mouth every 6 (six) hours as needed for moderate pain. (Patient not taking: Reported on 05/03/2020) 14 tablet 0   No current facility-administered medications for this visit.    Review of Systems: GENERAL: negative for malaise, night sweats HEENT: No changes in hearing or vision, no nose bleeds or other nasal problems. NECK: Negative for lumps, goiter, pain and significant neck  swelling RESPIRATORY: Negative for cough, wheezing CARDIOVASCULAR: Negative for chest pain, leg swelling, palpitations, orthopnea GI: SEE HPI MUSCULOSKELETAL: Negative for joint pain or swelling, back pain, and muscle pain. SKIN: Negative for lesions, rash PSYCH: Negative for sleep disturbance, mood disorder and recent psychosocial stressors. HEMATOLOGY Negative for prolonged bleeding, bruising easily, and swollen nodes. ENDOCRINE: Negative for cold or heat intolerance, polyuria, polydipsia and goiter. NEURO: negative for tremor, gait imbalance, syncope and seizures. The remainder of the review of systems is noncontributory.   Physical Exam: BP 123/78 (BP Location: Right Arm, Patient Position: Sitting, Cuff Size: Normal)   Pulse 90   Temp 98.3 F (36.8 C) (Oral)   Ht 5\' 3"  (1.6 m)   Wt 184 lb (83.5 kg)   LMP 11/29/2011   BMI 32.59 kg/m  GENERAL: The patient is AO x3, in no acute distress. HEENT: Head is normocephalic and atraumatic. EOMI are intact. Mouth is well hydrated and without lesions. NECK: Supple. No masses LUNGS: Clear to auscultation. No presence of rhonchi/wheezing/rales. Adequate chest expansion HEART: RRR, normal s1 and s2. ABDOMEN: Soft, nontender, no guarding, no peritoneal signs, and nondistended. BS +. No masses. EXTREMITIES: Without any cyanosis, clubbing, rash, lesions or edema. NEUROLOGIC: AOx3, no focal motor deficit. SKIN: no jaundice, no rashes   Imaging/Labs: as above  I personally reviewed and interpreted the available labs, imaging and endoscopic files.  Impression and Plan: Sarah Garrett is a 59 y.o. female with PMH alpha gal allergy, HLD, GERD, melanoma s/p resection, asthma, hypothyroidism, hemochromatosis, who presents for evaluation after recent episode of acute diverticulitis.  The patient has had at least 2 documented episodes of acute diverticulitis in the past.  I explained to her that to prevent these episodes she will need to avoid  taking NSAIDs but no dietary changes will prevent these episodes in the future.  As she is not having multiple complicated episodes, surgical management is not warranted at this point.  I encouraged her to continue taking fiber to avoid developing more diverticula.  Nevertheless, it has been 8 years since she had her first colonoscopy given the previous episodes of diverticulitis, we will proceed with a colonoscopy at this point.  Finally, given her history of hemochromatosis, we will perform a liver elastography to determine if she has any advanced degree of liver fibrosis (F3) that will warrant [place] surveillance, although I consider this is less likely as her liver enzymes have been within normal limits and she does not  have any evidence of liver synthetic dysfunction.  -Schedule liver elastography -Schedule colonoscopy -Avoid using NSAIDs such as Aleve, ibuprofen, naproxen, Motrin, Voltaren or Advil (even the topical ones)  All questions were answered.      Sarah Peppers, MD Gastroenterology and Hepatology [facility]

## 2020-05-07 ENCOUNTER — Ambulatory Visit: Payer: BC Managed Care – PPO | Admitting: Family

## 2020-05-07 ENCOUNTER — Other Ambulatory Visit: Payer: BC Managed Care – PPO

## 2020-05-07 DIAGNOSIS — D485 Neoplasm of uncertain behavior of skin: Secondary | ICD-10-CM | POA: Diagnosis not present

## 2020-05-07 DIAGNOSIS — Z85820 Personal history of malignant melanoma of skin: Secondary | ICD-10-CM | POA: Diagnosis not present

## 2020-05-07 DIAGNOSIS — L738 Other specified follicular disorders: Secondary | ICD-10-CM | POA: Diagnosis not present

## 2020-05-07 DIAGNOSIS — L812 Freckles: Secondary | ICD-10-CM | POA: Diagnosis not present

## 2020-05-07 DIAGNOSIS — D2239 Melanocytic nevi of other parts of face: Secondary | ICD-10-CM | POA: Diagnosis not present

## 2020-05-07 DIAGNOSIS — L570 Actinic keratosis: Secondary | ICD-10-CM | POA: Diagnosis not present

## 2020-05-13 ENCOUNTER — Ambulatory Visit (INDEPENDENT_AMBULATORY_CARE_PROVIDER_SITE_OTHER): Payer: BC Managed Care – PPO | Admitting: General Surgery

## 2020-05-13 ENCOUNTER — Other Ambulatory Visit: Payer: Self-pay

## 2020-05-13 ENCOUNTER — Encounter: Payer: Self-pay | Admitting: General Surgery

## 2020-05-13 VITALS — BP 128/84 | HR 83 | Temp 98.1°F | Resp 14 | Ht 63.0 in | Wt 186.0 lb

## 2020-05-13 DIAGNOSIS — K5700 Diverticulitis of small intestine with perforation and abscess without bleeding: Secondary | ICD-10-CM

## 2020-05-13 NOTE — Patient Instructions (Signed)
Continue keeping stools regular and soft. Colonoscopy in November.

## 2020-05-13 NOTE — Progress Notes (Signed)
[facility] Note   HPI:  59 y.o. Female presents to clinic for follow-up evaluation of her diverticulitis. She is getting a colonoscopy in a few weeks. She is feeling much better and has no complaints.   Review of Systems:  No fever or chills All other review of systems: otherwise negative   Vital Signs:  BP 128/84   Pulse 83   Temp 98.1 F (36.7 C) (Oral)   Resp 14   Ht 5\' 3"  (1.6 m)   Wt 186 lb (84.4 kg)   LMP 11/29/2011   SpO2 94%   BMI 32.95 kg/m    Physical Exam:  Physical Exam Vitals reviewed.  HENT:     Head: Normocephalic.  Cardiovascular:     Rate and Rhythm: Normal rate.  Pulmonary:     Effort: Pulmonary effort is normal.  Abdominal:     General: There is no distension.     Palpations: Abdomen is soft.     Tenderness: There is no abdominal tenderness.  Musculoskeletal:        General: Normal range of motion.  Neurological:     General: No focal deficit present.     Mental Status: She is alert and oriented to person, place, and time.      Assessment:  44 y.o. yo Female with recent perforated diverticulitis and prior episodes who needs a repeat colonoscopy given the episodes and remoteness of prior colonoscopy in 2013.   Plan:  Continue keeping stools regular and soft. Colonoscopy in November. Will see in a few months to discuss her option of any elective colectomy given the history  Future Appointments  Date Time Provider Medicine [place]  05/18/2020  9:30 AM AP-US 2 AP-US Deneise Lever [facility]  06/28/2020  8:00 AM AP-SCREENING AP-DOIBP None  07/01/2020  1:30 PM Virl Cagey, MD [facility] None  07/06/2020  8:10 AM Claretta Fraise, MD [facility] None    Curlene Labrum, MD [facility] [address] [place], [place] [address] [phone] (office)

## 2020-05-15 DIAGNOSIS — M5126 Other intervertebral disc displacement, lumbar region: Secondary | ICD-10-CM | POA: Diagnosis not present

## 2020-05-18 ENCOUNTER — Ambulatory Visit (HOSPITAL_COMMUNITY)
Admission: RE | Admit: 2020-05-18 | Discharge: 2020-05-18 | Disposition: A | Discharge status: Home / Self Care | Payer: BC Managed Care – PPO | Source: Ambulatory Visit | Attending: Gastroenterology | Admitting: Gastroenterology

## 2020-05-18 ENCOUNTER — Other Ambulatory Visit: Payer: Self-pay

## 2020-05-18 DIAGNOSIS — K7581 Nonalcoholic steatohepatitis (NASH): Secondary | ICD-10-CM | POA: Diagnosis not present

## 2020-05-19 ENCOUNTER — Ambulatory Visit: Payer: BC Managed Care – PPO | Admitting: Family Medicine

## 2020-06-08 DIAGNOSIS — M545 Low back pain, unspecified: Secondary | ICD-10-CM | POA: Diagnosis not present

## 2020-06-08 DIAGNOSIS — M1711 Unilateral primary osteoarthritis, right knee: Secondary | ICD-10-CM | POA: Diagnosis not present

## 2020-06-28 ENCOUNTER — Other Ambulatory Visit: Payer: Self-pay

## 2020-06-28 ENCOUNTER — Other Ambulatory Visit (HOSPITAL_COMMUNITY)
Admission: RE | Admit: 2020-06-28 | Discharge: 2020-06-28 | Disposition: A | Discharge status: Home / Self Care | Payer: BC Managed Care – PPO | Source: Ambulatory Visit | Attending: Gastroenterology | Admitting: Gastroenterology

## 2020-06-28 DIAGNOSIS — K219 Gastro-esophageal reflux disease without esophagitis: Secondary | ICD-10-CM | POA: Diagnosis not present

## 2020-06-28 DIAGNOSIS — Z887 Allergy status to serum and vaccine status: Secondary | ICD-10-CM | POA: Diagnosis not present

## 2020-06-28 DIAGNOSIS — K5732 Diverticulitis of large intestine without perforation or abscess without bleeding: Secondary | ICD-10-CM | POA: Diagnosis present

## 2020-06-28 DIAGNOSIS — Z888 Allergy status to other drugs, medicaments and biological substances status: Secondary | ICD-10-CM | POA: Diagnosis not present

## 2020-06-28 DIAGNOSIS — Z881 Allergy status to other antibiotic agents status: Secondary | ICD-10-CM | POA: Diagnosis not present

## 2020-06-28 DIAGNOSIS — Z882 Allergy status to sulfonamides status: Secondary | ICD-10-CM | POA: Diagnosis not present

## 2020-06-28 DIAGNOSIS — K5730 Diverticulosis of large intestine without perforation or abscess without bleeding: Secondary | ICD-10-CM | POA: Diagnosis not present

## 2020-06-28 DIAGNOSIS — Z20822 Contact with and (suspected) exposure to covid-19: Secondary | ICD-10-CM | POA: Insufficient documentation

## 2020-06-28 DIAGNOSIS — I10 Essential (primary) hypertension: Secondary | ICD-10-CM | POA: Diagnosis not present

## 2020-06-28 DIAGNOSIS — Z8249 Family history of ischemic heart disease and other diseases of the circulatory system: Secondary | ICD-10-CM | POA: Diagnosis not present

## 2020-06-28 DIAGNOSIS — Z01812 Encounter for preprocedural laboratory examination: Secondary | ICD-10-CM | POA: Insufficient documentation

## 2020-06-28 DIAGNOSIS — D649 Anemia, unspecified: Secondary | ICD-10-CM | POA: Diagnosis not present

## 2020-06-28 DIAGNOSIS — Z87891 Personal history of nicotine dependence: Secondary | ICD-10-CM | POA: Diagnosis not present

## 2020-06-28 DIAGNOSIS — Z791 Long term (current) use of non-steroidal anti-inflammatories (NSAID): Secondary | ICD-10-CM | POA: Diagnosis not present

## 2020-06-28 DIAGNOSIS — Z886 Allergy status to analgesic agent status: Secondary | ICD-10-CM | POA: Diagnosis not present

## 2020-06-28 DIAGNOSIS — J45909 Unspecified asthma, uncomplicated: Secondary | ICD-10-CM | POA: Diagnosis not present

## 2020-06-28 DIAGNOSIS — Z79899 Other long term (current) drug therapy: Secondary | ICD-10-CM | POA: Diagnosis not present

## 2020-06-28 DIAGNOSIS — Z7951 Long term (current) use of inhaled steroids: Secondary | ICD-10-CM | POA: Diagnosis not present

## 2020-06-28 DIAGNOSIS — K644 Residual hemorrhoidal skin tags: Secondary | ICD-10-CM | POA: Diagnosis not present

## 2020-06-28 LAB — SARS CORONAVIRUS 2 (TAT 6-24 HRS): SARS Coronavirus 2: NEGATIVE

## 2020-06-29 ENCOUNTER — Ambulatory Visit (HOSPITAL_COMMUNITY)
Admission: RE | Admit: 2020-06-29 | Discharge: 2020-06-29 | Disposition: A | Discharge status: Home / Self Care | Payer: BC Managed Care – PPO | Attending: Internal Medicine | Admitting: Internal Medicine

## 2020-06-29 ENCOUNTER — Encounter (HOSPITAL_COMMUNITY): Payer: Self-pay | Admitting: Internal Medicine

## 2020-06-29 ENCOUNTER — Ambulatory Visit (HOSPITAL_COMMUNITY): Payer: BC Managed Care – PPO | Admitting: Anesthesiology

## 2020-06-29 ENCOUNTER — Ambulatory Visit (HOSPITAL_COMMUNITY): Admit: 2020-06-29 | Payer: BC Managed Care – PPO | Admitting: Internal Medicine

## 2020-06-29 ENCOUNTER — Encounter (INDEPENDENT_AMBULATORY_CARE_PROVIDER_SITE_OTHER): Payer: Self-pay | Admitting: *Deleted

## 2020-06-29 ENCOUNTER — Other Ambulatory Visit: Payer: Self-pay

## 2020-06-29 ENCOUNTER — Encounter (HOSPITAL_COMMUNITY)
Admission: RE | Disposition: A | Discharge status: Home / Self Care | Payer: Self-pay | Source: Home / Self Care | Attending: Internal Medicine

## 2020-06-29 DIAGNOSIS — Z8249 Family history of ischemic heart disease and other diseases of the circulatory system: Secondary | ICD-10-CM | POA: Diagnosis not present

## 2020-06-29 DIAGNOSIS — Z20822 Contact with and (suspected) exposure to covid-19: Secondary | ICD-10-CM | POA: Diagnosis not present

## 2020-06-29 DIAGNOSIS — Z8719 Personal history of other diseases of the digestive system: Secondary | ICD-10-CM | POA: Diagnosis not present

## 2020-06-29 DIAGNOSIS — K5730 Diverticulosis of large intestine without perforation or abscess without bleeding: Secondary | ICD-10-CM | POA: Insufficient documentation

## 2020-06-29 DIAGNOSIS — K644 Residual hemorrhoidal skin tags: Secondary | ICD-10-CM | POA: Diagnosis not present

## 2020-06-29 DIAGNOSIS — D649 Anemia, unspecified: Secondary | ICD-10-CM | POA: Insufficient documentation

## 2020-06-29 DIAGNOSIS — Z791 Long term (current) use of non-steroidal anti-inflammatories (NSAID): Secondary | ICD-10-CM | POA: Insufficient documentation

## 2020-06-29 DIAGNOSIS — Z7951 Long term (current) use of inhaled steroids: Secondary | ICD-10-CM | POA: Insufficient documentation

## 2020-06-29 DIAGNOSIS — I10 Essential (primary) hypertension: Secondary | ICD-10-CM | POA: Diagnosis not present

## 2020-06-29 DIAGNOSIS — K219 Gastro-esophageal reflux disease without esophagitis: Secondary | ICD-10-CM | POA: Insufficient documentation

## 2020-06-29 DIAGNOSIS — Z886 Allergy status to analgesic agent status: Secondary | ICD-10-CM | POA: Insufficient documentation

## 2020-06-29 DIAGNOSIS — Z888 Allergy status to other drugs, medicaments and biological substances status: Secondary | ICD-10-CM | POA: Insufficient documentation

## 2020-06-29 DIAGNOSIS — J45909 Unspecified asthma, uncomplicated: Secondary | ICD-10-CM | POA: Insufficient documentation

## 2020-06-29 DIAGNOSIS — Z79899 Other long term (current) drug therapy: Secondary | ICD-10-CM | POA: Insufficient documentation

## 2020-06-29 DIAGNOSIS — Z87891 Personal history of nicotine dependence: Secondary | ICD-10-CM | POA: Insufficient documentation

## 2020-06-29 DIAGNOSIS — Z881 Allergy status to other antibiotic agents status: Secondary | ICD-10-CM | POA: Insufficient documentation

## 2020-06-29 DIAGNOSIS — Z887 Allergy status to serum and vaccine status: Secondary | ICD-10-CM | POA: Insufficient documentation

## 2020-06-29 DIAGNOSIS — Z882 Allergy status to sulfonamides status: Secondary | ICD-10-CM | POA: Diagnosis not present

## 2020-06-29 DIAGNOSIS — K5732 Diverticulitis of large intestine without perforation or abscess without bleeding: Secondary | ICD-10-CM | POA: Diagnosis not present

## 2020-06-29 HISTORY — PX: COLONOSCOPY WITH PROPOFOL: SHX5780

## 2020-06-29 LAB — HM COLONOSCOPY

## 2020-06-29 SURGERY — COLONOSCOPY WITH PROPOFOL
Anesthesia: Monitor Anesthesia Care

## 2020-06-29 SURGERY — COLONOSCOPY WITH PROPOFOL
Anesthesia: General

## 2020-06-29 MED ORDER — LACTATED RINGERS IV SOLN: INTRAVENOUS | Status: DC

## 2020-06-29 MED ORDER — STERILE WATER FOR IRRIGATION IR SOLN
Status: DC | PRN
  Administered 2020-06-29: 1.5 mL

## 2020-06-29 MED ORDER — SIMETHICONE 40 MG/0.6ML PO SUSP
ORAL | Status: AC
  Filled 2020-06-29: qty 0.6

## 2020-06-29 MED ORDER — PROPOFOL 500 MG/50ML IV EMUL
INTRAVENOUS | Status: DC | PRN
  Administered 2020-06-29: 150 ug/kg/min via INTRAVENOUS

## 2020-06-29 MED ORDER — PROPOFOL 10 MG/ML IV BOLUS
INTRAVENOUS | Status: DC | PRN
  Administered 2020-06-29: 60 mg via INTRAVENOUS
  Administered 2020-06-29: 30 mg via INTRAVENOUS
  Administered 2020-06-29 (×2): 20 mg via INTRAVENOUS

## 2020-06-29 MED ORDER — EPHEDRINE SULFATE 50 MG/ML IJ SOLN
INTRAMUSCULAR | Status: DC | PRN
  Administered 2020-06-29: 10 mg via INTRAVENOUS

## 2020-06-29 NOTE — Anesthesia Preprocedure Evaluation (Signed)
Anesthesia Evaluation  Patient identified by MRN, date of birth, ID band Patient awake    Reviewed: Allergy & Precautions, H&P , NPO status , Patient's Chart, lab work & pertinent test results, reviewed documented beta blocker date and time   Airway Mallampati: II  TM Distance: >3 FB Neck ROM: full    Dental no notable dental hx.    Pulmonary neg pulmonary ROS, asthma , former smoker,    Pulmonary exam normal breath sounds clear to auscultation       Cardiovascular Exercise Tolerance: Good hypertension, negative cardio ROS   Rhythm:regular Rate:Normal     Neuro/Psych negative neurological ROS  negative psych ROS   GI/Hepatic negative GI ROS, Neg liver ROS, GERD  Medicated,  Endo/Other  negative endocrine ROSHypothyroidism   Renal/GU negative Renal ROS  negative genitourinary   Musculoskeletal   Abdominal   Peds  Hematology negative hematology ROS (+) Blood dyscrasia, anemia ,   Anesthesia Other Findings   Reproductive/Obstetrics negative OB ROS                             Anesthesia Physical Anesthesia Plan  ASA: II  Anesthesia Plan: General   Post-op Pain Management:    Induction:   PONV Risk Score and Plan: Propofol infusion  Airway Management Planned:   Additional Equipment:   Intra-op Plan:   Post-operative Plan:   Informed Consent: I have reviewed the patients History and Physical, chart, labs and discussed the procedure including the risks, benefits and alternatives for the proposed anesthesia with the patient or authorized representative who has indicated his/her understanding and acceptance.     Dental Advisory Given  Plan Discussed with: CRNA  Anesthesia Plan Comments:         Anesthesia Quick Evaluation

## 2020-06-29 NOTE — H&P (Signed)
Sarah Garrett is an 59 y.o. female.   Chief Complaint: Patient is here for colonoscopy. HPI: Patient 59 year old Caucasian female who has a history of diverticulitis.  She says last episode was the worst and she had to be hospitalized in August 2021.  She has fully recovered.  Her bowels are back to normal.  She denies diarrhea constipation abdominal pain or rectal bleeding.  Last colonoscopy was 8 or 9 years ago. She does not take OTC NSAIDs. She has alpha gal allergy and does not eat red meat. Family history is negative for CRC or IBD. Patient is aware that Dr. Jenetta Downer could not be here because of family emergency and agreeable to proceed with colonoscopy by me.  Past Medical History:  Diagnosis Date  . Anemia   . Diverticulosis   . GERD (gastroesophageal reflux disease)   . H/O adenoidectomy   . Hemochromatosis 07/07/2011  . Hyperlipidemia   . Hypertension   . Melanoma ([place])    right shoulder  . Thyroid disease     Past Surgical History:  Procedure Laterality Date  . BREAST BIOPSY  2004   left; benign  . MELANOMA EXCISION  09/28/11   right shoulder  . TONSILLECTOMY AND ADENOIDECTOMY      Family History  Problem Relation Age of Onset  . Heart disease Mother        by pass  . Colon cancer Neg Hx   . Stomach cancer Neg Hx    Social History:  reports that she quit smoking about 42 years ago. Her smoking use included cigarettes. She started smoking about 45 years ago. She has a 0.75 pack-year smoking history. She has never used smokeless tobacco. She reports that she does not drink alcohol and does not use drugs.  Allergies:  Allergies  Allergen Reactions  . Aspirin Swelling    Swelling of mouth and throat  . Ace Inhibitors Other (See Comments)    Per BH:ALPFXTK  . Actos [Pioglitazone Hydrochloride] Other (See Comments)    Per pt: unknown  . Alpha-Gal Other (See Comments)    unknown  . Ciprofloxacin Other (See Comments)    Unknown reaction  . Crestor  [Rosuvastatin Calcium] Other (See Comments)    Per pt: unknown  . Pneumococcal Vaccines Rash  . Sulfonamide Derivatives Other (See Comments)    Swelling of mouth and throat    Medications Prior to Admission  Medication Sig Dispense Refill  . cetirizine (ZYRTEC) 10 MG tablet Take 10 mg by mouth at bedtime.     . Fluticasone-Salmeterol (ADVAIR DISKUS) 100-50 MCG/DOSE AEPB TAKE 1 PUFF BY MOUTH TWICE A DAY (Patient taking differently: Inhale 1 puff into the lungs in the morning and at bedtime. ) 180 each 3  . furosemide (LASIX) 40 MG tablet Take 1 tablet (40 mg total) by mouth daily as needed. (Patient taking differently: Take 40 mg by mouth daily. ) 90 tablet 3  . icosapent Ethyl (VASCEPA) 1 g capsule Take 1 capsule (1 g total) by mouth 2 (two) times daily. (Patient taking differently: Take 1 g by mouth daily. ) 180 capsule 3  . levothyroxine (SYNTHROID) 112 MCG tablet Take 1 tablet (112 mcg total) by mouth daily. 90 tablet 1  . losartan-hydrochlorothiazide (HYZAAR) 100-12.5 MG tablet Take 1 tablet by mouth daily. as directed (Patient taking differently: Take 0.5 tablets by mouth daily. as directed) 90 tablet 3  . montelukast (SINGULAIR) 10 MG tablet TAKE 1 TABLET BY MOUTH EVERYDAY AT BEDTIME (Patient taking differently: Take  10 mg by mouth at bedtime. ) 90 tablet 3  . Multiple Vitamins-Minerals (MULTIVITAMIN WITH MINERALS) tablet Take 1 tablet by mouth daily. One a day woman    . naproxen (NAPROSYN) 500 MG tablet Take 500 mg by mouth daily as needed (Back pain).    . ondansetron (ZOFRAN) 4 MG tablet Take 1 tablet (4 mg total) by mouth every 6 (six) hours as needed for nausea. 20 tablet 0  . potassium chloride (KLOR-CON) 10 MEQ tablet Take 10 mEq by mouth daily.    . rosuvastatin (CRESTOR) 20 MG tablet TAKE 1 TABLET (20 MG TOTAL) BY MOUTH DAILY. (Patient taking differently: Take 10 mg by mouth at bedtime. ) 90 tablet 3  . Vitamin D, Ergocalciferol, (DRISDOL) 1.25 MG (50000 UNIT) CAPS capsule Take 1  capsule (50,000 Units total) by mouth every 7 (seven) days. (Patient taking differently: Take 50,000 Units by mouth every 7 (seven) days. Sunday) 12 capsule 3  . albuterol (VENTOLIN HFA) 108 (90 Base) MCG/ACT inhaler TAKE 2 PUFFS BY MOUTH EVERY 6 HOURS AS NEEDED (Patient taking differently: Inhale 2 puffs into the lungs every 6 (six) hours as needed for wheezing or shortness of breath. ) 18 g 11  . diclofenac Sodium (VOLTAREN) 1 % GEL Apply 2 g topically 4 (four) times daily. (Patient not taking: Reported on 06/21/2020) 350 g 5  . EPINEPHrine 0.3 mg/0.3 mL IJ SOAJ injection Inject 0.3 mLs (0.3 mg total) into the muscle as needed for anaphylaxis. 1 each 3  . esomeprazole (NEXIUM) 40 MG capsule Take 1 capsule (40 mg total) by mouth as needed. (Patient taking differently: Take 40 mg by mouth daily as needed (Heartburn). ) 30 capsule 6  . famotidine (PEPCID) 20 MG tablet Take 20 mg by mouth 2 (two) times daily as needed (Alphagale).     . hydrOXYzine (ATARAX/VISTARIL) 50 MG tablet Take 1 tablet (50 mg total) by mouth 3 (three) times daily as needed. (Patient not taking: Reported on 06/21/2020) 30 tablet 0  . hyoscyamine (LEVSIN SL) 0.125 MG SL tablet Place 1 tablet (0.125 mg total) under the tongue as needed. Place 1-2 tablet under the tongue until it dissolves as needed (Patient taking differently: Place 0.125 mg under the tongue as needed (IB). ) 30 tablet 2    Results for orders placed or performed during the hospital encounter of 06/28/20 (from the past 48 hour(s))  SARS CORONAVIRUS 2 (TAT 6-24 HRS) Nasopharyngeal Nasopharyngeal Swab     Status: None   Collection Time: 06/28/20  8:58 AM   Specimen: Nasopharyngeal Swab  Result Value Ref Range   SARS Coronavirus 2 NEGATIVE NEGATIVE    Comment: (NOTE) SARS-CoV-2 target nucleic acids are NOT DETECTED.  The SARS-CoV-2 RNA is generally detectable in upper and lower respiratory specimens during the acute phase of infection. Negative results do not  preclude SARS-CoV-2 infection, do not rule out co-infections with other pathogens, and should not be used as the sole basis for treatment or other patient management decisions. Negative results must be combined with clinical observations, patient history, and epidemiological information. The expected result is Negative.  Fact Sheet for Patients: SugarRoll.be  Fact Sheet for Healthcare Providers: https://www.woods-mathews.com/  This test is not yet approved or cleared by the [place] FDA and  has been authorized for detection and/or diagnosis of SARS-CoV-2 by FDA under an Emergency Use Authorization (EUA). This EUA will remain  in effect (meaning this test can be used) for the duration of the COVID-19 declaration under Se ction  564(b)(1) of the Act, 21 U.S.C. section 360bbb-3(b)(1), unless the authorization is terminated or revoked sooner.  Performed at [facility], [place] [address] [place], [place] [address]    No results found.  Review of Systems  Blood pressure 140/77, pulse 84, temperature 98.5 F (36.9 C), temperature source Oral, resp. rate 14, height 5\' 3"  (1.6 m), weight 81.6 kg, last menstrual period 11/29/2011, SpO2 95 %. Physical Exam HENT:     Mouth/Throat:     Mouth: Mucous membranes are moist.     Pharynx: Oropharynx is clear.  Eyes:     General: No scleral icterus.    Conjunctiva/sclera: Conjunctivae normal.  Cardiovascular:     Rate and Rhythm: Normal rate and regular rhythm.     Heart sounds: Normal heart sounds. No murmur heard.   Pulmonary:     Effort: Pulmonary effort is normal.     Breath sounds: Normal breath sounds.  Abdominal:     General: There is no distension.     Palpations: Abdomen is soft. There is no mass.     Tenderness: There is no abdominal tenderness.  Musculoskeletal:        General: No swelling.     Cervical back: Neck supple.  Lymphadenopathy:     Cervical: No cervical  adenopathy.  Skin:    General: Skin is warm.  Neurological:     Mental Status: She is alert.      Assessment/Plan  History of diverticulitis. Diagnostic colonoscopy.  Hildred Laser, MD 06/29/2020, 7:35 AM

## 2020-06-29 NOTE — Op Note (Signed)
[facility] Patient Name: Sarah Garrett Procedure Date: 06/29/2020 7:08 AM MRN: [id number] Date of Birth: 01-Nov-1960 Attending MD: Hildred Laser , MD CSN: [id number] Age: 59 Admit Type: Outpatient Procedure:                Colonoscopy Indications:              Follow-up of diverticulitis Providers:                Hildred Laser, MD, Caprice Kluver, Casimer Bilis, Technician Referring MD:             Claretta Fraise, MD Medicines:                Propofol per Anesthesia Complications:            No immediate complications. Estimated Blood Loss:     Estimated blood loss: none. Procedure:                Pre-Anesthesia Assessment:                           - Prior to the procedure, a History and Physical                            was performed, and patient medications and                            allergies were reviewed. The patient's tolerance of                            previous anesthesia was also reviewed. The risks                            and benefits of the procedure and the sedation                            options and risks were discussed with the patient.                            All questions were answered, and informed consent                            was obtained. Prior Anticoagulants: The patient has                            taken no previous anticoagulant or antiplatelet                            agents. ASA Grade Assessment: II - A patient with                            mild systemic disease. After reviewing the risks  and benefits, the patient was deemed in                            satisfactory condition to undergo the procedure.                           After obtaining informed consent, the colonoscope                            was passed under direct vision. Throughout the                            procedure, the patient's blood pressure, pulse, and                            oxygen  saturations were monitored continuously. The                            [id number]-[id number] ([id number]) scope was introduced through                            the anus and advanced to the the terminal ileum,                            with identification of the appendiceal orifice and                            IC valve. The colonoscopy was somewhat difficult                            due to restricted mobility of the colon. Successful                            completion of the procedure was aided by                            withdrawing the scope and replacing with the                            'babyscope'. The patient tolerated the procedure                            well. The quality of the bowel preparation was                            good. The terminal ileum, ileocecal valve,                            appendiceal orifice, and rectum were photographed. Scope In: 7:47:10 AM Scope Out: 8:07:27 AM Total Procedure Duration: 0 hours 20 minutes 17 seconds  Findings:      The perianal and digital rectal examinations were normal.      The terminal ileum appeared normal.      Scattered diverticula were found in  the sigmoid colon.      The exam was otherwise normal throughout the examined colon.      External hemorrhoids were found during retroflexion. The hemorrhoids       were small. Impression:               - The examined portion of the ileum was normal.                           - Diverticulosis in the sigmoid colon.                           - External hemorrhoids.                           - No specimens collected. Moderate Sedation:      Per Anesthesia Care Recommendation:           - Patient has a contact number available for                            emergencies. The signs and symptoms of potential                            delayed complications were discussed with the                            patient. Return to normal activities tomorrow.                            Written  discharge instructions were provided to the                            patient.                           - High fiber diet today.                           - Continue present medications.                           - Repeat colonoscopy in 10 years for screening                            purposes. Procedure Code(s):        --- Professional ---                           [phone], Colonoscopy, flexible; diagnostic, including                            collection of specimen(s) by brushing or washing,                            when performed (separate procedure) Diagnosis Code(s):        --- Professional ---  K64.4, Residual hemorrhoidal skin tags                           K57.32, Diverticulitis of large intestine without                            perforation or abscess without bleeding                           K57.30, Diverticulosis of large intestine without                            perforation or abscess without bleeding CPT copyright 2019 [employer]. All rights reserved. The codes documented in this report are preliminary and upon coder review may  be revised to meet current compliance requirements. Hildred Laser, MD Hildred Laser, MD 06/29/2020 8:18:23 AM This report has been signed electronically. Number of Addenda: 0

## 2020-06-29 NOTE — Discharge Instructions (Signed)
Resume usual medications as before. Keep NSAID use to minimum. High-fiber diet. No driving for 24 hours. Next screening exam in 10 years.   Colonoscopy, Adult, Care After This sheet gives you information about how to care for yourself after your procedure. Your doctor may also give you more specific instructions. If you have problems or questions, call your doctor. What can I expect after the procedure? After the procedure, it is common to have:  A small amount of blood in your poop (stool) for 24 hours.  Some gas.  Mild cramping or bloating in your belly (abdomen). Follow these instructions at home: Eating and drinking   Drink enough fluid to keep your pee (urine) pale yellow.  Follow instructions from your doctor about what you cannot eat or drink.  Return to your normal diet as told by your doctor. Avoid heavy or fried foods that are hard to digest. Activity  Rest as told by your doctor.  Do not sit for a long time without moving. Get up to take short walks every 1-2 hours. This is important. Ask for help if you feel weak or unsteady.  Return to your normal activities as told by your doctor. Ask your doctor what activities are safe for you. To help cramping and bloating:   Try walking around.  Put heat on your belly as told by your doctor. Use the heat source that your doctor recommends, such as a moist heat pack or a heating pad. ? Put a towel between your skin and the heat source. ? Leave the heat on for 20-30 minutes. ? Remove the heat if your skin turns bright red. This is very important if you are unable to feel pain, heat, or cold. You may have a greater risk of getting burned. General instructions  For the first 24 hours after the procedure: ? Do not drive or use machinery. ? Do not sign important documents. ? Do not drink alcohol. ? Do your daily activities more slowly than normal. ? Eat foods that are soft and easy to digest.  Take over-the-counter or  prescription medicines only as told by your doctor.  Keep all follow-up visits as told by your doctor. This is important. Contact a doctor if:  You have blood in your poop 2-3 days after the procedure. Get help right away if:  You have more than a small amount of blood in your poop.  You see large clumps of tissue (blood clots) in your poop.  Your belly is swollen.  You feel like you may vomit (nauseous).  You vomit.  You have a fever.  You have belly pain that gets worse, and medicine does not help your pain. Summary  After the procedure, it is common to have a small amount of blood in your poop. You may also have mild cramping and bloating in your belly.  For the first 24 hours after the procedure, do not drive or use machinery, do not sign important documents, and do not drink alcohol.  Get help right away if you have a lot of blood in your poop, feel like you may vomit, have a fever, or have more belly pain. This information is not intended to replace advice given to you by your health care provider. Make sure you discuss any questions you have with your health care provider. Document Revised: 02/10/2019 Document Reviewed: 02/10/2019 Elsevier Patient Education  [place].  Diverticulosis  Diverticulosis is a condition that develops when small pouches (diverticula) form in  the wall of the large intestine (colon). The colon is where water is absorbed and stool (feces) is formed. The pouches form when the inside layer of the colon pushes through weak spots in the outer layers of the colon. You may have a few pouches or many of them. The pouches usually do not cause problems unless they become inflamed or infected. When this happens, the condition is called diverticulitis. What are the causes? The cause of this condition is not known. What increases the risk? The following factors may make you more likely to develop this condition:  Being older than age 55. Your risk  for this condition increases with age. Diverticulosis is rare among people younger than age 5. By age 65, many people have it.  Eating a low-fiber diet.  Having frequent constipation.  Being overweight.  Not getting enough exercise.  Smoking.  Taking over-the-counter pain medicines, like aspirin and ibuprofen.  Having a family history of diverticulosis. What are the signs or symptoms? In most people, there are no symptoms of this condition. If you do have symptoms, they may include:  Bloating.  Cramps in the abdomen.  Constipation or diarrhea.  Pain in the lower left side of the abdomen. How is this diagnosed? Because diverticulosis usually has no symptoms, it is most often diagnosed during an exam for other colon problems. The condition may be diagnosed by:  Using a flexible scope to examine the colon (colonoscopy).  Taking an X-ray of the colon after dye has been put into the colon (barium enema).  Having a CT scan. How is this treated? You may not need treatment for this condition. Your health care provider may recommend treatment to prevent problems. You may need treatment if you have symptoms or if you previously had diverticulitis. Treatment may include:  Eating a high-fiber diet.  Taking a fiber supplement.  Taking a live bacteria supplement (probiotic).  Taking medicine to relax your colon. Follow these instructions at home: Medicines  Take over-the-counter and prescription medicines only as told by your health care provider.  If told by your health care provider, take a fiber supplement or probiotic. Constipation prevention Your condition may cause constipation. To prevent or treat constipation, you may need to:  Drink enough fluid to keep your urine pale yellow.  Take over-the-counter or prescription medicines.  Eat foods that are high in fiber, such as beans, whole grains, and fresh fruits and vegetables.  Limit foods that are high in fat and  processed sugars, such as fried or sweet foods.  General instructions  Try not to strain when you have a bowel movement.  Keep all follow-up visits as told by your health care provider. This is important. Contact a health care provider if you:  Have pain in your abdomen.  Have bloating.  Have cramps.  Have not had a bowel movement in 3 days. Get help right away if:  Your pain gets worse.  Your bloating becomes very bad.  You have a fever or chills, and your symptoms suddenly get worse.  You vomit.  You have bowel movements that are bloody or black.  You have bleeding from your rectum. Summary  Diverticulosis is a condition that develops when small pouches (diverticula) form in the wall of the large intestine (colon).  You may have a few pouches or many of them.  This condition is most often diagnosed during an exam for other colon problems.  Treatment may include increasing the fiber in your diet, taking supplements,  or taking medicines. This information is not intended to replace advice given to you by your health care provider. Make sure you discuss any questions you have with your health care provider. Document Revised: 02/13/2019 Document Reviewed: 02/13/2019 Elsevier Patient Education  [place].  Hemorrhoids Hemorrhoids are swollen veins in and around the rectum or anus. There are two types of hemorrhoids:  Internal hemorrhoids. These occur in the veins that are just inside the rectum. They may poke through to the outside and become irritated and painful.  External hemorrhoids. These occur in the veins that are outside the anus and can be felt as a painful swelling or hard lump near the anus. Most hemorrhoids do not cause serious problems, and they can be managed with home treatments such as diet and lifestyle changes. If home treatments do not help the symptoms, procedures can be done to shrink or remove the hemorrhoids. What are the causes? This  condition is caused by increased pressure in the anal area. This pressure may result from various things, including:  Constipation.  Straining to have a bowel movement.  Diarrhea.  Pregnancy.  Obesity.  Sitting for long periods of time.  Heavy lifting or other activity that causes you to strain.  Anal sex.  Riding a bike for a long period of time. What are the signs or symptoms? Symptoms of this condition include:  Pain.  Anal itching or irritation.  Rectal bleeding.  Leakage of stool (feces).  Anal swelling.  One or more lumps around the anus. How is this diagnosed? This condition can often be diagnosed through a visual exam. Other exams or tests may also be done, such as:  An exam that involves feeling the rectal area with a gloved hand (digital rectal exam).  An exam of the anal canal that is done using a small tube (anoscope).  A blood test, if you have lost a significant amount of blood.  A test to look inside the colon using a flexible tube with a camera on the end (sigmoidoscopy or colonoscopy). How is this treated? This condition can usually be treated at home. However, various procedures may be done if dietary changes, lifestyle changes, and other home treatments do not help your symptoms. These procedures can help make the hemorrhoids smaller or remove them completely. Some of these procedures involve surgery, and others do not. Common procedures include:  Rubber band ligation. Rubber bands are placed at the base of the hemorrhoids to cut off their blood supply.  Sclerotherapy. Medicine is injected into the hemorrhoids to shrink them.  Infrared coagulation. A type of light energy is used to get rid of the hemorrhoids.  Hemorrhoidectomy surgery. The hemorrhoids are surgically removed, and the veins that supply them are tied off.  Stapled hemorrhoidopexy surgery. The surgeon staples the base of the hemorrhoid to the rectal wall. Follow these instructions  at home: Eating and drinking   Eat foods that have a lot of fiber in them, such as whole grains, beans, nuts, fruits, and vegetables.  Ask your health care provider about taking products that have added fiber (fiber supplements).  Reduce the amount of fat in your diet. You can do this by eating low-fat dairy products, eating less red meat, and avoiding processed foods.  Drink enough fluid to keep your urine pale yellow. Managing pain and swelling   Take warm sitz baths for 20 minutes, 3-4 times a day to ease pain and discomfort. You may do this in a bathtub or  using a portable sitz bath that fits over the toilet.  If directed, apply ice to the affected area. Using ice packs between sitz baths may be helpful. ? Put ice in a plastic bag. ? Place a towel between your skin and the bag. ? Leave the ice on for 20 minutes, 2-3 times a day. General instructions  Take over-the-counter and prescription medicines only as told by your health care provider.  Use medicated creams or suppositories as told.  Get regular exercise. Ask your health care provider how much and what kind of exercise is best for you. In general, you should do moderate exercise for at least 30 minutes on most days of the week (150 minutes each week). This can include activities such as walking, biking, or yoga.  Go to the bathroom when you have the urge to have a bowel movement. Do not wait.  Avoid straining to have bowel movements.  Keep the anal area dry and clean. Use wet toilet paper or moist towelettes after a bowel movement.  Do not sit on the toilet for long periods of time. This increases blood pooling and pain.  Keep all follow-up visits as told by your health care provider. This is important. Contact a health care provider if you have:  Increasing pain and swelling that are not controlled by treatment or medicine.  Difficulty having a bowel movement, or you are unable to have a bowel movement.  Pain or  inflammation outside the area of the hemorrhoids. Get help right away if you have:  Uncontrolled bleeding from your rectum. Summary  Hemorrhoids are swollen veins in and around the rectum or anus.  Most hemorrhoids can be managed with home treatments such as diet and lifestyle changes.  Taking warm sitz baths can help ease pain and discomfort.  In severe cases, procedures or surgery can be done to shrink or remove the hemorrhoids. This information is not intended to replace advice given to you by your health care provider. Make sure you discuss any questions you have with your health care provider. Document Revised: 12/13/2018 Document Reviewed: 12/06/2017 Elsevier Patient Education  [place].

## 2020-06-29 NOTE — Transfer of Care (Signed)
Immediate Anesthesia Transfer of Care Note  Patient: Sarah Garrett  Procedure(s) Performed: COLONOSCOPY WITH PROPOFOL (N/A )  Patient Location: PACU  Anesthesia Type:General  Level of Consciousness: awake  Airway & Oxygen Therapy: Patient Spontanous Breathing  Post-op Assessment: Report given to RN  Post vital signs: Reviewed  Last Vitals:  Vitals Value Taken Time  BP    Temp    Pulse    Resp    SpO2      Last Pain:  Vitals:   06/29/20 0742  TempSrc:   PainSc: 0-No pain      Patients Stated Pain Goal: 8 (65/[id number])  Complications: No complications documented.

## 2020-06-29 NOTE — Anesthesia Postprocedure Evaluation (Signed)
Anesthesia Post Note  Patient: Sarah Garrett  Procedure(s) Performed: COLONOSCOPY WITH PROPOFOL (N/A )  Patient location during evaluation: PACU Anesthesia Type: General Level of consciousness: awake and alert Pain management: pain level controlled Vital Signs Assessment: post-procedure vital signs reviewed and stable Respiratory status: spontaneous breathing Cardiovascular status: stable Postop Assessment: no apparent nausea or vomiting Anesthetic complications: no   No complications documented.   Last Vitals:  Vitals:   06/29/20 0654  BP: 140/77  Pulse: 84  Resp: 14  Temp: 36.9 C  SpO2: 95%    Last Pain:  Vitals:   06/29/20 0742  TempSrc:   PainSc: 0-No pain                 Kinsey Cowsert

## 2020-07-01 ENCOUNTER — Encounter: Payer: Self-pay | Admitting: General Surgery

## 2020-07-01 ENCOUNTER — Ambulatory Visit (INDEPENDENT_AMBULATORY_CARE_PROVIDER_SITE_OTHER): Payer: BC Managed Care – PPO | Admitting: General Surgery

## 2020-07-01 ENCOUNTER — Other Ambulatory Visit: Payer: Self-pay

## 2020-07-01 VITALS — BP 119/73 | HR 70 | Temp 98.3°F | Resp 14 | Ht 63.0 in | Wt 185.0 lb

## 2020-07-01 DIAGNOSIS — K5720 Diverticulitis of large intestine with perforation and abscess without bleeding: Secondary | ICD-10-CM | POA: Diagnosis not present

## 2020-07-01 NOTE — Progress Notes (Signed)
[facility] Note   HPI:  59 y.o. Female presents to clinic for follow-up evaluation of her diverticulitis with microperforation. She is doing well and having no pain. She is trying to eat more fiber. She has regular stools. She had a colonoscopy 11/30 that demonstrated no residual diverticulitis and only scattered diverticula in the sigmoid colon.   Review of Systems:  No pain No fevers Regular BMs  All other review of systems: otherwise negative   Vital Signs:  BP 119/73   Pulse 70   Temp 98.3 F (36.8 C) (Oral)   Resp 14   Ht 5\' 3"  (1.6 m)   Wt 185 lb (83.9 kg)   LMP 11/29/2011   SpO2 94%   BMI 32.77 kg/m    Physical Exam:  Physical Exam Vitals reviewed.  Cardiovascular:     Rate and Rhythm: Normal rate.  Pulmonary:     Effort: Pulmonary effort is normal.  Abdominal:     General: There is no distension.     Palpations: Abdomen is soft.     Tenderness: There is no abdominal tenderness.     Assessment:  59 y.o. yo Female with a history of diverticulitis mostly treated as outpatient and recent admission with microperforation. Doing well overall. Discussed individualized approach to elective sigmoid surgery and goal to help people have best quality of life. Discussed that every person is different and timing of surgery/ or option of surgery or not is different.   Plan:  - She wants to continue her lifestyle modifications for now and hopes to avoid surgery   - Will call if having issues, if remote issues with diverticulitis will need to contact PCP first    Curlene Labrum, MD [facility] [address] [place],  [address] [phone] (office)

## 2020-07-01 NOTE — Patient Instructions (Signed)
Diverticulosis/ Diverticulitis Information and Diet:   Diverticulosis is a condition in which small, bulging pouches (diverticuli) form inside the lower part of the intestine, usually in the colon. Constipation and straining during bowel movements can worsen the condition. A diet rich in fiber can help keep stools soft and prevent inflammation.  Diverticulitis occurs when the pouches in the colon become infected or inflamed. Dietary changes can help the colon heal.  Fiber is an important part of the diet for patients with diverticulosis. A high-fiber diet softens and gives bulk to the stool, allowing it to pass quickly and easily.  Diet for Diverticulosis Eat a high-fiber diet when you have diverticulosis. Fiber softens the stool and helps prevent constipation. It also can help decrease pressure in the colon and help prevent flare-ups of diverticulitis.  High-fiber foods include:  Beans and legumes Bran, whole wheat bread and whole grain cereals such as oatmeal Brown and wild rice Fruits such as apples, bananas and pears Vegetables such as broccoli, carrots, corn and squash Whole wheat pasta If you currently don't have a diet high in fiber, you should add fiber gradually. This helps avoid bloating and abdominal discomfort. The target is to eat 25 to 30 grams of fiber daily. Drink at least 8 cups of fluid daily. Fluid will help soften your stool. Exercise also promotes bowel movement and helps prevent constipation.  When the colon is not inflamed, eat popcorn, nuts and seeds as tolerated.  Diet for Diverticulitis During flare ups of diverticulitis, follow a clear liquid diet. Your doctor will let you know when to progress from clear liquids to low fiber solids and then back to your normal diet.  A clear liquid diet means no solid foods. Juices should have no pulp. During the clear liquid diet, you may consume:  Broth Clear juices such as apple, cranberry and grape. (Avoid orange  juice) Jell-O Popsicles  When you're able to eat solid food, choose low fiber foods while healing. Low fiber foods include:  Canned or cooked fruit without seeds or skin, such as applesauce and melon Canned or well cooked vegetables without seeds and skin Dairy products such as cheese, milk and yogurt Eggs Low-fiber cereal Meat that is ground or tender and well cooked Pasta White bread and white rice AVOID RED MEAT WHILE YOU HAVE AN ACTIVE FLARE.  AVOID NSAIDS, IBUPROFEN, ALEVE, ASPIRIN WHILE YOU HAVE AN ACTIVE FLARE.  EXERCISE AS WELL AS SMOKING CESSATION CAN HELP PREVENT RECURRENCES.   After symptoms improve, usually within two to four days, you may add 5 to 15 grams of fiber a day back into your diet. Resume your high fiber diet when you no longer have symptoms.  

## 2020-07-02 ENCOUNTER — Other Ambulatory Visit: Payer: BC Managed Care – PPO

## 2020-07-02 DIAGNOSIS — E032 Hypothyroidism due to medicaments and other exogenous substances: Secondary | ICD-10-CM

## 2020-07-02 DIAGNOSIS — E785 Hyperlipidemia, unspecified: Secondary | ICD-10-CM | POA: Diagnosis not present

## 2020-07-02 DIAGNOSIS — K5720 Diverticulitis of large intestine with perforation and abscess without bleeding: Secondary | ICD-10-CM | POA: Diagnosis not present

## 2020-07-02 DIAGNOSIS — E7800 Pure hypercholesterolemia, unspecified: Secondary | ICD-10-CM | POA: Diagnosis not present

## 2020-07-03 LAB — TSH: TSH: 2.38 u[IU]/mL (ref 0.450–4.500)

## 2020-07-03 LAB — T4, FREE: Free T4: 1.38 ng/dL (ref 0.82–1.77)

## 2020-07-06 ENCOUNTER — Ambulatory Visit (INDEPENDENT_AMBULATORY_CARE_PROVIDER_SITE_OTHER): Payer: BC Managed Care – PPO | Admitting: Family Medicine

## 2020-07-06 ENCOUNTER — Encounter: Payer: Self-pay | Admitting: Family Medicine

## 2020-07-06 ENCOUNTER — Other Ambulatory Visit: Payer: Self-pay

## 2020-07-06 VITALS — BP 131/80 | HR 82 | Temp 97.7°F | Resp 20 | Ht 63.0 in | Wt 186.2 lb

## 2020-07-06 DIAGNOSIS — E7800 Pure hypercholesterolemia, unspecified: Secondary | ICD-10-CM

## 2020-07-06 DIAGNOSIS — E039 Hypothyroidism, unspecified: Secondary | ICD-10-CM | POA: Diagnosis not present

## 2020-07-06 DIAGNOSIS — I10 Essential (primary) hypertension: Secondary | ICD-10-CM | POA: Diagnosis not present

## 2020-07-06 DIAGNOSIS — D509 Iron deficiency anemia, unspecified: Secondary | ICD-10-CM | POA: Diagnosis not present

## 2020-07-06 DIAGNOSIS — E559 Vitamin D deficiency, unspecified: Secondary | ICD-10-CM

## 2020-07-06 DIAGNOSIS — K219 Gastro-esophageal reflux disease without esophagitis: Secondary | ICD-10-CM

## 2020-07-06 DIAGNOSIS — E032 Hypothyroidism due to medicaments and other exogenous substances: Secondary | ICD-10-CM

## 2020-07-06 DIAGNOSIS — J42 Unspecified chronic bronchitis: Secondary | ICD-10-CM

## 2020-07-06 DIAGNOSIS — J4530 Mild persistent asthma, uncomplicated: Secondary | ICD-10-CM

## 2020-07-06 LAB — LIPID PANEL

## 2020-07-06 MED ORDER — FLUTICASONE-SALMETEROL 100-50 MCG/DOSE IN AEPB: INHALATION_SPRAY | RESPIRATORY_TRACT | 5 refills | Status: DC

## 2020-07-06 MED ORDER — MONTELUKAST SODIUM 10 MG PO TABS: ORAL_TABLET | ORAL | 3 refills | Status: DC

## 2020-07-06 MED ORDER — POTASSIUM CHLORIDE ER 10 MEQ PO TBCR
10.0000 meq | EXTENDED_RELEASE_TABLET | Freq: Every day | ORAL | 5 refills | Status: DC
Start: 2020-07-06 — End: 2020-12-22

## 2020-07-06 MED ORDER — LOSARTAN POTASSIUM-HCTZ 100-12.5 MG PO TABS: 1.0000 | ORAL_TABLET | Freq: Every day | ORAL | 1 refills | Status: DC

## 2020-07-06 MED ORDER — LEVOTHYROXINE SODIUM 112 MCG PO TABS: 112.0000 ug | ORAL_TABLET | Freq: Every day | ORAL | 1 refills | Status: DC

## 2020-07-06 MED ORDER — FUROSEMIDE 40 MG PO TABS: 40.0000 mg | ORAL_TABLET | Freq: Every day | ORAL | 5 refills | Status: DC | PRN

## 2020-07-06 MED ORDER — HYOSCYAMINE SULFATE 0.125 MG SL SUBL: 0.1250 mg | SUBLINGUAL_TABLET | SUBLINGUAL | 5 refills | Status: AC | PRN

## 2020-07-06 MED ORDER — CETIRIZINE HCL 10 MG PO TABS
10.0000 mg | ORAL_TABLET | Freq: Every day | ORAL | 5 refills | Status: DC
Start: 2020-07-06 — End: 2022-11-16

## 2020-07-06 MED ORDER — ICOSAPENT ETHYL 1 G PO CAPS: 1.0000 g | ORAL_CAPSULE | Freq: Two times a day (BID) | ORAL | 5 refills | Status: DC

## 2020-07-06 MED ORDER — ROSUVASTATIN CALCIUM 20 MG PO TABS: ORAL_TABLET | ORAL | 1 refills | Status: DC

## 2020-07-06 MED ORDER — ALBUTEROL SULFATE HFA 108 (90 BASE) MCG/ACT IN AERS: INHALATION_SPRAY | RESPIRATORY_TRACT | 5 refills | Status: DC

## 2020-07-06 MED ORDER — ESOMEPRAZOLE MAGNESIUM 40 MG PO CPDR: 40.0000 mg | DELAYED_RELEASE_CAPSULE | ORAL | 5 refills | Status: DC | PRN

## 2020-07-06 MED ORDER — VITAMIN D (ERGOCALCIFEROL) 1.25 MG (50000 UNIT) PO CAPS: 50000.0000 [IU] | ORAL_CAPSULE | ORAL | 3 refills | Status: DC

## 2020-07-06 NOTE — Progress Notes (Signed)
Subjective:  Patient ID: Sarah Garrett, female    DOB: [birth year]  Age: 59 y.o. MRN: [id number]  CC: Medical Management of Chronic Issues   HPI Sarah Garrett presents for  follow-up of hypertension. Patient has no history of headache chest pain or shortness of breath or recent cough. Patient also denies symptoms of TIA such as focal numbness or weakness. Patient denies side effects from medication. States taking it regularly.  The diverticulitis symptoms have completely resolved. She is avoiding seeds, nuts etc.    follow-up on  thyroid. The patient has a history of hypothyroidism for many years. It has been adjusted recently. Pt. denies any change in  voice, loss of hair, heat or cold intolerance. Energy level has been adequate to good. Patient denies constipation and diarrhea. No myxedema. Medication is as noted below. Verified that pt is taking it daily on an empty stomach. Well tolerated.    History Sarah Garrett has a past medical history of Anemia, Diverticulosis, GERD (gastroesophageal reflux disease), H/O adenoidectomy, Hemochromatosis (07/07/2011), Hyperlipidemia, Hypertension, Melanoma ([place]), and Thyroid disease.   She has a past surgical history that includes Tonsillectomy and adenoidectomy; Breast biopsy (2004); Melanoma excision (09/28/11); and Colonoscopy with propofol (N/A, 06/29/2020).   Her family history includes Heart disease in her mother.She reports that she quit smoking about 42 years ago. Her smoking use included cigarettes. She started smoking about 45 years ago. She has a 0.75 pack-year smoking history. She has never used smokeless tobacco. She reports that she does not drink alcohol and does not use drugs.  Current Outpatient Medications on File Prior to Visit  Medication Sig Dispense Refill  . EPINEPHrine 0.3 mg/0.3 mL IJ SOAJ injection Inject 0.3 mLs (0.3 mg total) into the muscle as needed for anaphylaxis. 1 each 3  . Multiple Vitamins-Minerals (MULTIVITAMIN  WITH MINERALS) tablet Take 1 tablet by mouth daily. One a day woman    . naproxen (NAPROSYN) 500 MG tablet Take 500 mg by mouth daily as needed (Back pain). (Patient not taking: Reported on 07/06/2020)    . ondansetron (ZOFRAN) 4 MG tablet Take 1 tablet (4 mg total) by mouth every 6 (six) hours as needed for nausea. (Patient not taking: Reported on 07/06/2020) 20 tablet 0   No current facility-administered medications on file prior to visit.    ROS Review of Systems  Constitutional: Negative.   HENT: Negative.   Eyes: Negative for visual disturbance.  Respiratory: Negative for shortness of breath.   Cardiovascular: Negative for chest pain.  Gastrointestinal: Negative for abdominal pain.  Musculoskeletal: Negative for arthralgias.    Objective:  BP 131/80   Pulse 82   Temp 97.7 F (36.5 C) (Temporal)   Resp 20   Ht 5\' 3"  (1.6 m)   Wt 186 lb 4 oz (84.5 kg)   LMP 11/29/2011   SpO2 95%   BMI 32.99 kg/m   BP Readings from Last 3 Encounters:  07/06/20 131/80  07/01/20 119/73  06/29/20 (!) 111/57    Wt Readings from Last 3 Encounters:  07/06/20 186 lb 4 oz (84.5 kg)  07/01/20 185 lb (83.9 kg)  06/29/20 180 lb (81.6 kg)     Physical Exam Constitutional:      General: She is not in acute distress.    Appearance: She is well-developed.  HENT:     Head: Normocephalic and atraumatic.  Eyes:     Conjunctiva/sclera: Conjunctivae normal.     Pupils: Pupils are equal, round, and reactive to light.  Neck:     Thyroid: No thyromegaly.  Cardiovascular:     Rate and Rhythm: Normal rate and regular rhythm.     Heart sounds: Normal heart sounds. No murmur heard.   Pulmonary:     Effort: Pulmonary effort is normal. No respiratory distress.     Breath sounds: Normal breath sounds. No wheezing or rales.  Abdominal:     General: Bowel sounds are normal. There is no distension.     Palpations: Abdomen is soft.     Tenderness: There is no abdominal tenderness.  Musculoskeletal:         General: Normal range of motion.     Cervical back: Normal range of motion and neck supple.  Lymphadenopathy:     Cervical: No cervical adenopathy.  Skin:    General: Skin is warm and dry.  Neurological:     Mental Status: She is alert and oriented to person, place, and time.  Psychiatric:        Behavior: Behavior normal.        Thought Content: Thought content normal.        Judgment: Judgment normal.       Assessment & Plan:   Sarah Garrett was seen today for medical management of chronic issues.  Diagnoses and all orders for this visit:  Acquired hypothyroidism  Hereditary hemochromatosis ([place])  Pure hypercholesterolemia -     rosuvastatin (CRESTOR) 20 MG tablet; TAKE 1 TABLET (20 MG TOTAL) BY MOUTH DAILY. -     icosapent Ethyl (VASCEPA) 1 g capsule; Take 1 capsule (1 g total) by mouth 2 (two) times daily.  Iron deficiency anemia, unspecified iron deficiency anemia type  Essential hypertension -     losartan-hydrochlorothiazide (HYZAAR) 100-12.5 MG tablet; Take 1 tablet by mouth daily. as directed -     furosemide (LASIX) 40 MG tablet; Take 1 tablet (40 mg total) by mouth daily as needed.  Gastroesophageal reflux disease, unspecified whether esophagitis present -     esomeprazole (NEXIUM) 40 MG capsule; Take 1 capsule (40 mg total) by mouth as needed.  Vitamin D deficiency -     Vitamin D, Ergocalciferol, (DRISDOL) 1.25 MG (50000 UNIT) CAPS capsule; Take 1 capsule (50,000 Units total) by mouth every 7 (seven) days.  Mild persistent asthma without complication -     montelukast (SINGULAIR) 10 MG tablet; TAKE 1 TABLET BY MOUTH EVERYDAY AT BEDTIME -     Fluticasone-Salmeterol (ADVAIR DISKUS) 100-50 MCG/DOSE AEPB; TAKE 1 PUFF BY MOUTH TWICE A DAY  Hypothyroidism due to medication -     levothyroxine (SYNTHROID) 112 MCG tablet; Take 1 tablet (112 mcg total) by mouth daily.  Chronic bronchitis, unspecified chronic bronchitis type (HCC) -     Fluticasone-Salmeterol  (ADVAIR DISKUS) 100-50 MCG/DOSE AEPB; TAKE 1 PUFF BY MOUTH TWICE A DAY -     albuterol (VENTOLIN HFA) 108 (90 Base) MCG/ACT inhaler; TAKE 2 PUFFS BY MOUTH EVERY 6 HOURS AS NEEDED  Hereditary hemochromatosis -     albuterol (VENTOLIN HFA) 108 (90 Base) MCG/ACT inhaler; TAKE 2 PUFFS BY MOUTH EVERY 6 HOURS AS NEEDED  Other orders -     potassium chloride (KLOR-CON) 10 MEQ tablet; Take 1 tablet (10 mEq total) by mouth daily. -     hyoscyamine (LEVSIN SL) 0.125 MG SL tablet; Place 1 tablet (0.125 mg total) under the tongue as needed. Place 1-2 tablet under the tongue until it dissolves as needed -     cetirizine (ZYRTEC) 10 MG tablet; Take 1  tablet (10 mg total) by mouth at bedtime.   Allergies as of 07/06/2020      Reactions   Aspirin Swelling   Swelling of mouth and throat   Ace Inhibitors Other (See Comments)   Per [id number]   Actos [pioglitazone Hydrochloride] Other (See Comments)   Per pt: unknown   Alpha-gal Other (See Comments)   unknown   Ciprofloxacin Other (See Comments)   Unknown reaction   Crestor [rosuvastatin Calcium] Other (See Comments)   Per pt: unknown   Pneumococcal Vaccines Rash   Sulfonamide Derivatives Other (See Comments)   Swelling of mouth and throat      Medication List       Accurate as of July 06, 2020 10:14 PM. If you have any questions, ask your nurse or doctor.        STOP taking these medications   famotidine 20 MG tablet Commonly known as: PEPCID Stopped by: Claretta Fraise, MD     TAKE these medications   albuterol 108 (90 Base) MCG/ACT inhaler Commonly known as: VENTOLIN HFA TAKE 2 PUFFS BY MOUTH EVERY 6 HOURS AS NEEDED What changed:   how much to take  how to take this  when to take this  reasons to take this  additional instructions   cetirizine 10 MG tablet Commonly known as: ZYRTEC Take 1 tablet (10 mg total) by mouth at bedtime.   EPINEPHrine 0.3 mg/0.3 mL Soaj injection Commonly known as: EPI-PEN Inject 0.3 mLs  (0.3 mg total) into the muscle as needed for anaphylaxis.   esomeprazole 40 MG capsule Commonly known as: NexIUM Take 1 capsule (40 mg total) by mouth as needed. What changed:   when to take this  reasons to take this   Fluticasone-Salmeterol 100-50 MCG/DOSE Aepb Commonly known as: Advair Diskus TAKE 1 PUFF BY MOUTH TWICE A DAY What changed:   how much to take  how to take this  when to take this  additional instructions   furosemide 40 MG tablet Commonly known as: LASIX Take 1 tablet (40 mg total) by mouth daily as needed. What changed:   when to take this  reasons to take this   hyoscyamine 0.125 MG SL tablet Commonly known as: LEVSIN SL Place 1 tablet (0.125 mg total) under the tongue as needed. Place 1-2 tablet under the tongue until it dissolves as needed What changed:   reasons to take this  additional instructions   icosapent Ethyl 1 g capsule Commonly known as: Vascepa Take 1 capsule (1 g total) by mouth 2 (two) times daily. What changed: when to take this   levothyroxine 112 MCG tablet Commonly known as: SYNTHROID Take 1 tablet (112 mcg total) by mouth daily.   losartan-hydrochlorothiazide 100-12.5 MG tablet Commonly known as: HYZAAR Take 1 tablet by mouth daily. as directed What changed: how much to take   montelukast 10 MG tablet Commonly known as: SINGULAIR TAKE 1 TABLET BY MOUTH EVERYDAY AT BEDTIME What changed:   how much to take  how to take this  when to take this  additional instructions   multivitamin with minerals tablet Take 1 tablet by mouth daily. One a day woman   naproxen 500 MG tablet Commonly known as: NAPROSYN Take 500 mg by mouth daily as needed (Back pain).   ondansetron 4 MG tablet Commonly known as: ZOFRAN Take 1 tablet (4 mg total) by mouth every 6 (six) hours as needed for nausea.   potassium chloride 10 MEQ tablet Commonly known as:  KLOR-CON Take 1 tablet (10 mEq total) by mouth daily.   rosuvastatin  20 MG tablet Commonly known as: CRESTOR TAKE 1 TABLET (20 MG TOTAL) BY MOUTH DAILY. What changed:   how much to take  how to take this  when to take this  additional instructions   Vitamin D (Ergocalciferol) 1.25 MG (50000 UNIT) Caps capsule Commonly known as: DRISDOL Take 1 capsule (50,000 Units total) by mouth every 7 (seven) days. What changed: additional instructions       Meds ordered this encounter  Medications  . Vitamin D, Ergocalciferol, (DRISDOL) 1.25 MG (50000 UNIT) CAPS capsule    Sig: Take 1 capsule (50,000 Units total) by mouth every 7 (seven) days.    Dispense:  12 capsule    Refill:  3  . rosuvastatin (CRESTOR) 20 MG tablet    Sig: TAKE 1 TABLET (20 MG TOTAL) BY MOUTH DAILY.    Dispense:  90 tablet    Refill:  1  . potassium chloride (KLOR-CON) 10 MEQ tablet    Sig: Take 1 tablet (10 mEq total) by mouth daily.    Dispense:  30 tablet    Refill:  5  . montelukast (SINGULAIR) 10 MG tablet    Sig: TAKE 1 TABLET BY MOUTH EVERYDAY AT BEDTIME    Dispense:  90 tablet    Refill:  3  . losartan-hydrochlorothiazide (HYZAAR) 100-12.5 MG tablet    Sig: Take 1 tablet by mouth daily. as directed    Dispense:  90 tablet    Refill:  1  . levothyroxine (SYNTHROID) 112 MCG tablet    Sig: Take 1 tablet (112 mcg total) by mouth daily.    Dispense:  90 tablet    Refill:  1  . icosapent Ethyl (VASCEPA) 1 g capsule    Sig: Take 1 capsule (1 g total) by mouth 2 (two) times daily.    Dispense:  180 capsule    Refill:  5  . hyoscyamine (LEVSIN SL) 0.125 MG SL tablet    Sig: Place 1 tablet (0.125 mg total) under the tongue as needed. Place 1-2 tablet under the tongue until it dissolves as needed    Dispense:  30 tablet    Refill:  5  . furosemide (LASIX) 40 MG tablet    Sig: Take 1 tablet (40 mg total) by mouth daily as needed.    Dispense:  90 tablet    Refill:  5  . Fluticasone-Salmeterol (ADVAIR DISKUS) 100-50 MCG/DOSE AEPB    Sig: TAKE 1 PUFF BY MOUTH TWICE A DAY      Dispense:  180 each    Refill:  5    PT WOULD LIKE REFILLS PLEASE  . esomeprazole (NEXIUM) 40 MG capsule    Sig: Take 1 capsule (40 mg total) by mouth as needed.    Dispense:  30 capsule    Refill:  5  . cetirizine (ZYRTEC) 10 MG tablet    Sig: Take 1 tablet (10 mg total) by mouth at bedtime.    Dispense:  30 tablet    Refill:  5  . albuterol (VENTOLIN HFA) 108 (90 Base) MCG/ACT inhaler    Sig: TAKE 2 PUFFS BY MOUTH EVERY 6 HOURS AS NEEDED    Dispense:  18 g    Refill:  5      Follow-up: Return in about 6 months (around 01/04/2021), or if symptoms worsen or fail to improve.  Claretta Fraise, M.D.

## 2020-07-07 LAB — LIPID PANEL
Chol/HDL Ratio: 2.7 ratio (ref 0.0–4.4)
Cholesterol, Total: 137 mg/dL (ref 100–199)
HDL: 51 mg/dL (ref >39)
LDL Chol Calc (NIH): 69 mg/dL (ref 0–99)
Triglycerides: 89 mg/dL (ref 0–149)
VLDL Cholesterol Cal: 17 mg/dL (ref 5–40)

## 2020-07-07 LAB — CMP14+EGFR
ALT: 19 IU/L (ref 0–32)
AST: 21 IU/L (ref 0–40)
Albumin/Globulin Ratio: 1.8 (ref 1.2–2.2)
Albumin: 4.3 g/dL (ref 3.8–4.8)
Alkaline Phosphatase: 73 IU/L (ref 44–121)
BUN/Creatinine Ratio: 15 (ref 12–28)
BUN: 13 mg/dL (ref 8–27)
Bilirubin Total: <0.2 mg/dL (ref 0.0–1.2)
CO2: 25 mmol/L (ref 20–29)
Calcium: 9.7 mg/dL (ref 8.7–10.3)
Chloride: 103 mmol/L (ref 96–106)
Creatinine, Ser: 0.87 mg/dL (ref 0.57–1.00)
GFR calc Af Amer: 83 mL/min/{1.73_m2} (ref >59)
GFR calc non Af Amer: 72 mL/min/{1.73_m2} (ref >59)
Globulin, Total: 2.4 g/dL (ref 1.5–4.5)
Glucose: 89 mg/dL (ref 65–99)
Potassium: 3.5 mmol/L (ref 3.5–5.2)
Sodium: 144 mmol/L (ref 134–144)
Total Protein: 6.7 g/dL (ref 6.0–8.5)

## 2020-07-07 LAB — SPECIMEN STATUS REPORT

## 2020-07-13 NOTE — Progress Notes (Signed)
Hello Sarah Garrett,  Your lab result is normal and/or stable.Some minor variations that are not significant are commonly marked abnormal, but do not represent any medical problem for you.  Best regards, Marsh Heckler, M.D.

## 2020-07-21 ENCOUNTER — Telehealth: Payer: Self-pay

## 2020-07-21 NOTE — Telephone Encounter (Signed)
Sarah Garrett (Key: [id number]) Rx #: [phone] Vascepa 1GM capsules   Form Sarah Garrett [place] Medicare Part D General Authorization Form

## 2020-07-22 NOTE — Telephone Encounter (Signed)
PA resubmitted using the [employer] [facility] [employer] Form (CB).  Key: [id number]

## 2020-07-22 NOTE — Telephone Encounter (Signed)
Your request has been approved Effective from 07/22/2020 through 07/21/2021.  Pharmacy aware.

## 2020-09-30 ENCOUNTER — Telehealth: Payer: Self-pay

## 2020-09-30 NOTE — Telephone Encounter (Signed)
Pt states she is tired every afternoon and feels like her thyroid may be acting up again. Scheduled pt to come in with Dr Livia Snellen 10/04/20 at 8:10.

## 2020-09-30 NOTE — Telephone Encounter (Signed)
Pt called requesting to speak with Dr Livia Snellen nursing. Has questions about her thyroid.  [phone]

## 2020-10-04 ENCOUNTER — Other Ambulatory Visit: Payer: Self-pay

## 2020-10-04 ENCOUNTER — Encounter: Payer: Self-pay | Admitting: Family Medicine

## 2020-10-04 ENCOUNTER — Ambulatory Visit (INDEPENDENT_AMBULATORY_CARE_PROVIDER_SITE_OTHER): Payer: BC Managed Care – PPO | Admitting: Family Medicine

## 2020-10-04 VITALS — BP 125/81 | HR 76 | Temp 97.6°F | Wt 191.6 lb

## 2020-10-04 DIAGNOSIS — E039 Hypothyroidism, unspecified: Secondary | ICD-10-CM | POA: Diagnosis not present

## 2020-10-04 DIAGNOSIS — R5383 Other fatigue: Secondary | ICD-10-CM | POA: Diagnosis not present

## 2020-10-04 NOTE — Progress Notes (Signed)
Subjective:  Patient ID: Sarah Garrett, female    DOB: Aug 02, 1960  Age: 60 y.o. MRN: [id number]  CC: Fatigue   HPI Sarah Garrett presents for fatigue every day. Wakes up tired frequently. Gets worse through the day. Struggles toward the end of the work day. PT. Had sleep study several years ago due to not breathing in the night. However the study was normal.  She has a history of hypothyroidism.  We recently reduced her dose due to an overactive level on her blood work.  Since that time she has noticed a decline in energy.  She has experienced weight gain of 5 pounds.  She has not had other stigmata of underactive thyroid though. Depression screen [facility] 2/9 10/04/2020 07/06/2020 04/06/2020  Decreased Interest 0 0 0  Down, Depressed, Hopeless 0 0 0  PHQ - 2 Score 0 0 0    History Sarah Garrett has a past medical history of Anemia, Diverticulosis, GERD (gastroesophageal reflux disease), H/O adenoidectomy, Hemochromatosis (07/07/2011), Hyperlipidemia, Hypertension, Melanoma ([place]), and Thyroid disease.   She has a past surgical history that includes Tonsillectomy and adenoidectomy; Breast biopsy (2004); Melanoma excision (09/28/11); and Colonoscopy with propofol (N/A, 06/29/2020).   Her family history includes Heart disease in her mother.She reports that she quit smoking about 42 years ago. Her smoking use included cigarettes. She started smoking about 46 years ago. She has a 0.75 pack-year smoking history. She has never used smokeless tobacco. She reports that she does not drink alcohol and does not use drugs.    ROS Review of Systems  Constitutional: Negative.   HENT: Negative.   Eyes: Negative for visual disturbance.  Respiratory: Negative for shortness of breath.   Cardiovascular: Negative for chest pain.  Gastrointestinal: Negative for abdominal pain.  Musculoskeletal: Negative for arthralgias.    Objective:  BP 125/81   Pulse 76   Temp 97.6 F (36.4 C)   Wt 191 lb 9.6 oz (86.9  kg)   LMP 11/29/2011   SpO2 95%   BMI 33.94 kg/m   BP Readings from Last 3 Encounters:  10/04/20 125/81  07/06/20 131/80  07/01/20 119/73    Wt Readings from Last 3 Encounters:  10/04/20 191 lb 9.6 oz (86.9 kg)  07/06/20 186 lb 4 oz (84.5 kg)  07/01/20 185 lb (83.9 kg)     Physical Exam Constitutional:      General: She is not in acute distress.    Appearance: She is well-developed.  HENT:     Head: Normocephalic and atraumatic.  Eyes:     Conjunctiva/sclera: Conjunctivae normal.     Pupils: Pupils are equal, round, and reactive to light.  Neck:     Thyroid: No thyromegaly.  Cardiovascular:     Rate and Rhythm: Normal rate and regular rhythm.     Heart sounds: Normal heart sounds. No murmur heard.   Pulmonary:     Effort: Pulmonary effort is normal. No respiratory distress.     Breath sounds: Normal breath sounds. No wheezing or rales.  Abdominal:     General: Bowel sounds are normal. There is no distension.     Palpations: Abdomen is soft.     Tenderness: There is no abdominal tenderness.  Musculoskeletal:        General: Normal range of motion.     Cervical back: Normal range of motion and neck supple.  Lymphadenopathy:     Cervical: No cervical adenopathy.  Skin:    General: Skin is warm and dry.  Neurological:     Mental Status: She is alert and oriented to person, place, and time.  Psychiatric:        Behavior: Behavior normal.        Thought Content: Thought content normal.        Judgment: Judgment normal.       Assessment & Plan:   Sarah Garrett was seen today for fatigue.  Diagnoses and all orders for this visit:  Fatigue, unspecified type -     Home sleep test -     CBC with Differential/Platelet -     CMP14+EGFR -     TSH + free T4  Acquired hypothyroidism -     Home sleep test -     CBC with Differential/Platelet -     CMP14+EGFR -     TSH + free T4  Hereditary hemochromatosis ([place]) -     Ferritin       I am having Sarah Garrett maintain her EPINEPHrine, ondansetron, naproxen, multivitamin with minerals, Vitamin D (Ergocalciferol), rosuvastatin, potassium chloride, montelukast, losartan-hydrochlorothiazide, levothyroxine, icosapent Ethyl, hyoscyamine, furosemide, Fluticasone-Salmeterol, esomeprazole, cetirizine, and albuterol.  Allergies as of 10/04/2020      Reactions   Aspirin Swelling   Swelling of mouth and throat   Ace Inhibitors Other (See Comments)   Per [id number]   Actos [pioglitazone Hydrochloride] Other (See Comments)   Per pt: unknown   Alpha-gal Other (See Comments)   unknown   Ciprofloxacin Other (See Comments)   Unknown reaction   Crestor [rosuvastatin Calcium] Other (See Comments)   Per pt: unknown   Pneumococcal Vaccines Rash   Sulfonamide Derivatives Other (See Comments)   Swelling of mouth and throat      Medication List       Accurate as of October 04, 2020  6:06 PM. If you have any questions, ask your nurse or doctor.        albuterol 108 (90 Base) MCG/ACT inhaler Commonly known as: VENTOLIN HFA TAKE 2 PUFFS BY MOUTH EVERY 6 HOURS AS NEEDED   cetirizine 10 MG tablet Commonly known as: ZYRTEC Take 1 tablet (10 mg total) by mouth at bedtime.   EPINEPHrine 0.3 mg/0.3 mL Soaj injection Commonly known as: EPI-PEN Inject 0.3 mLs (0.3 mg total) into the muscle as needed for anaphylaxis.   esomeprazole 40 MG capsule Commonly known as: NexIUM Take 1 capsule (40 mg total) by mouth as needed.   Fluticasone-Salmeterol 100-50 MCG/DOSE Aepb Commonly known as: Advair Diskus TAKE 1 PUFF BY MOUTH TWICE A DAY   furosemide 40 MG tablet Commonly known as: LASIX Take 1 tablet (40 mg total) by mouth daily as needed.   hyoscyamine 0.125 MG SL tablet Commonly known as: LEVSIN SL Place 1 tablet (0.125 mg total) under the tongue as needed. Place 1-2 tablet under the tongue until it dissolves as needed   icosapent Ethyl 1 g capsule Commonly known as: Vascepa Take 1 capsule (1 g total)  by mouth 2 (two) times daily.   levothyroxine 112 MCG tablet Commonly known as: SYNTHROID Take 1 tablet (112 mcg total) by mouth daily.   losartan-hydrochlorothiazide 100-12.5 MG tablet Commonly known as: HYZAAR Take 1 tablet by mouth daily. as directed   montelukast 10 MG tablet Commonly known as: SINGULAIR TAKE 1 TABLET BY MOUTH EVERYDAY AT BEDTIME   multivitamin with minerals tablet Take 1 tablet by mouth daily. One a day woman   naproxen 500 MG tablet Commonly known as: NAPROSYN Take 500 mg by mouth  daily as needed (Back pain).   ondansetron 4 MG tablet Commonly known as: ZOFRAN Take 1 tablet (4 mg total) by mouth every 6 (six) hours as needed for nausea.   potassium chloride 10 MEQ tablet Commonly known as: KLOR-CON Take 1 tablet (10 mEq total) by mouth daily.   rosuvastatin 20 MG tablet Commonly known as: CRESTOR TAKE 1 TABLET (20 MG TOTAL) BY MOUTH DAILY.   Vitamin D (Ergocalciferol) 1.25 MG (50000 UNIT) Caps capsule Commonly known as: DRISDOL Take 1 capsule (50,000 Units total) by mouth every 7 (seven) days.        Follow-up: Return in about 6 weeks (around 11/15/2020) for Hypothyroidism.  Claretta Fraise, M.D.

## 2020-10-05 ENCOUNTER — Other Ambulatory Visit: Payer: Self-pay | Admitting: Family Medicine

## 2020-10-05 DIAGNOSIS — E032 Hypothyroidism due to medicaments and other exogenous substances: Secondary | ICD-10-CM

## 2020-10-05 LAB — CMP14+EGFR
ALT: 20 IU/L (ref 0–32)
AST: 24 IU/L (ref 0–40)
Albumin/Globulin Ratio: 1.6 (ref 1.2–2.2)
Albumin: 4.5 g/dL (ref 3.8–4.8)
Alkaline Phosphatase: 87 IU/L (ref 44–121)
BUN/Creatinine Ratio: 15 (ref 12–28)
BUN: 12 mg/dL (ref 8–27)
Bilirubin Total: 0.5 mg/dL (ref 0.0–1.2)
CO2: 25 mmol/L (ref 20–29)
Calcium: 9.9 mg/dL (ref 8.7–10.3)
Chloride: 99 mmol/L (ref 96–106)
Creatinine, Ser: 0.82 mg/dL (ref 0.57–1.00)
Globulin, Total: 2.8 g/dL (ref 1.5–4.5)
Glucose: 91 mg/dL (ref 65–99)
Potassium: 3.6 mmol/L (ref 3.5–5.2)
Sodium: 142 mmol/L (ref 134–144)
Total Protein: 7.3 g/dL (ref 6.0–8.5)
eGFR: 81 mL/min/{1.73_m2} (ref >59)

## 2020-10-05 LAB — CBC WITH DIFFERENTIAL/PLATELET
Basophils Absolute: 0.1 10*3/uL (ref 0.0–0.2)
Basos: 1 %
EOS (ABSOLUTE): 0.5 10*3/uL — ABNORMAL HIGH (ref 0.0–0.4)
Eos: 7 %
Hematocrit: 43.8 % (ref 34.0–46.6)
Hemoglobin: 15.4 g/dL (ref 11.1–15.9)
Immature Grans (Abs): 0 10*3/uL (ref 0.0–0.1)
Immature Granulocytes: 0 %
Lymphocytes Absolute: 2.6 10*3/uL (ref 0.7–3.1)
Lymphs: 34 %
MCH: 31.8 pg (ref 26.6–33.0)
MCHC: 35.2 g/dL (ref 31.5–35.7)
MCV: 90 fL (ref 79–97)
Monocytes Absolute: 0.4 10*3/uL (ref 0.1–0.9)
Monocytes: 6 %
Neutrophils Absolute: 4.1 10*3/uL (ref 1.4–7.0)
Neutrophils: 52 %
Platelets: 177 10*3/uL (ref 150–450)
RBC: 4.85 x10E6/uL (ref 3.77–5.28)
RDW: 13 % (ref 11.7–15.4)
WBC: 7.7 10*3/uL (ref 3.4–10.8)

## 2020-10-05 LAB — FERRITIN: Ferritin: 50 ng/mL (ref 15–150)

## 2020-10-05 LAB — TSH+FREE T4
Free T4: 1.42 ng/dL (ref 0.82–1.77)
TSH: 3.7 u[IU]/mL (ref 0.450–4.500)

## 2020-10-05 MED ORDER — LEVOTHYROXINE SODIUM 125 MCG PO TABS: 125.0000 ug | ORAL_TABLET | Freq: Every day | ORAL | 1 refills | Status: DC

## 2020-10-23 DIAGNOSIS — M5416 Radiculopathy, lumbar region: Secondary | ICD-10-CM | POA: Diagnosis not present

## 2020-11-04 DIAGNOSIS — M542 Cervicalgia: Secondary | ICD-10-CM | POA: Diagnosis not present

## 2020-11-04 DIAGNOSIS — M7542 Impingement syndrome of left shoulder: Secondary | ICD-10-CM | POA: Diagnosis not present

## 2020-11-04 DIAGNOSIS — M25561 Pain in right knee: Secondary | ICD-10-CM | POA: Diagnosis not present

## 2020-11-15 ENCOUNTER — Ambulatory Visit: Payer: BC Managed Care – PPO | Admitting: Family Medicine

## 2020-11-23 ENCOUNTER — Other Ambulatory Visit: Payer: Self-pay | Admitting: Family Medicine

## 2020-11-23 DIAGNOSIS — I10 Essential (primary) hypertension: Secondary | ICD-10-CM

## 2020-11-26 ENCOUNTER — Other Ambulatory Visit: Payer: Self-pay | Admitting: Family Medicine

## 2020-11-26 DIAGNOSIS — E7800 Pure hypercholesterolemia, unspecified: Secondary | ICD-10-CM

## 2020-12-06 DIAGNOSIS — M25561 Pain in right knee: Secondary | ICD-10-CM | POA: Diagnosis not present

## 2020-12-09 DIAGNOSIS — M5126 Other intervertebral disc displacement, lumbar region: Secondary | ICD-10-CM | POA: Diagnosis not present

## 2020-12-09 DIAGNOSIS — M542 Cervicalgia: Secondary | ICD-10-CM | POA: Diagnosis not present

## 2020-12-09 DIAGNOSIS — M546 Pain in thoracic spine: Secondary | ICD-10-CM | POA: Diagnosis not present

## 2020-12-10 ENCOUNTER — Other Ambulatory Visit: Payer: BC Managed Care – PPO

## 2020-12-10 DIAGNOSIS — I10 Essential (primary) hypertension: Secondary | ICD-10-CM | POA: Diagnosis not present

## 2020-12-11 LAB — LIPID PANEL
Chol/HDL Ratio: 2.7 ratio (ref 0.0–4.4)
Cholesterol, Total: 154 mg/dL (ref 100–199)
HDL: 57 mg/dL (ref >39)
LDL Chol Calc (NIH): 70 mg/dL (ref 0–99)
Triglycerides: 161 mg/dL — ABNORMAL HIGH (ref 0–149)
VLDL Cholesterol Cal: 27 mg/dL (ref 5–40)

## 2020-12-11 LAB — THYROID PANEL WITH TSH
Free Thyroxine Index: 3.3 (ref 1.2–4.9)
T3 Uptake Ratio: 27 % (ref 24–39)
T4, Total: 12.2 ug/dL — ABNORMAL HIGH (ref 4.5–12.0)
TSH: 2.21 u[IU]/mL (ref 0.450–4.500)

## 2020-12-13 NOTE — Progress Notes (Signed)
Hello Azlyn,  Your lab result is normal and/or stable.Some minor variations that are not significant are commonly marked abnormal, but do not represent any medical problem for you.  Best regards, Earvin Blazier, M.D.

## 2020-12-14 ENCOUNTER — Other Ambulatory Visit: Payer: Self-pay

## 2020-12-14 ENCOUNTER — Ambulatory Visit (INDEPENDENT_AMBULATORY_CARE_PROVIDER_SITE_OTHER): Payer: BC Managed Care – PPO | Admitting: Family Medicine

## 2020-12-14 ENCOUNTER — Encounter: Payer: Self-pay | Admitting: Family Medicine

## 2020-12-14 VITALS — BP 113/73 | HR 81 | Temp 97.8°F | Ht 63.0 in | Wt 192.2 lb

## 2020-12-14 DIAGNOSIS — E039 Hypothyroidism, unspecified: Secondary | ICD-10-CM | POA: Diagnosis not present

## 2020-12-14 DIAGNOSIS — E032 Hypothyroidism due to medicaments and other exogenous substances: Secondary | ICD-10-CM | POA: Diagnosis not present

## 2020-12-14 DIAGNOSIS — Z1231 Encounter for screening mammogram for malignant neoplasm of breast: Secondary | ICD-10-CM | POA: Diagnosis not present

## 2020-12-14 DIAGNOSIS — E7800 Pure hypercholesterolemia, unspecified: Secondary | ICD-10-CM | POA: Diagnosis not present

## 2020-12-14 DIAGNOSIS — I10 Essential (primary) hypertension: Secondary | ICD-10-CM

## 2020-12-14 DIAGNOSIS — S83231D Complex tear of medial meniscus, current injury, right knee, subsequent encounter: Secondary | ICD-10-CM | POA: Diagnosis not present

## 2020-12-14 MED ORDER — ROSUVASTATIN CALCIUM 20 MG PO TABS
ORAL_TABLET | ORAL | 1 refills | Status: DC
Start: 2020-12-14 — End: 2022-01-06

## 2020-12-14 MED ORDER — ICOSAPENT ETHYL 1 G PO CAPS: 1.0000 g | ORAL_CAPSULE | Freq: Two times a day (BID) | ORAL | 1 refills | Status: DC

## 2020-12-14 MED ORDER — LEVOTHYROXINE SODIUM 125 MCG PO TABS: 125.0000 ug | ORAL_TABLET | Freq: Every day | ORAL | 1 refills | Status: DC

## 2020-12-14 MED ORDER — LOSARTAN POTASSIUM-HCTZ 100-12.5 MG PO TABS: 1.0000 | ORAL_TABLET | Freq: Every day | ORAL | 1 refills | Status: DC

## 2020-12-14 NOTE — Progress Notes (Signed)
Subjective:  Patient ID: Sarah Garrett, female    DOB: 1960-09-02  Age: 60 y.o. MRN: [id number]  CC: Medical Management of Chronic Issues   HPI ELESA GARMAN presents for  follow-up on  thyroid. The patient has a history of hypothyroidism for many years. It has been stable recently. Pt. denies any change in  voice, loss of hair, heat or cold intolerance. Energy level has been adequate to good. Patient denies constipation and diarrhea. No myxedema. Medication is as noted below. Verified that pt is taking it daily on an empty stomach. Well tolerated.   in for follow-up of elevated cholesterol. Doing well without complaints on current medication. Denies side effects of statin including myalgia and arthralgia and nausea. Currently no chest pain, shortness of breath or other cardiovascular related symptoms noted.   Depression screen [facility] 2/9 12/14/2020 12/14/2020 10/04/2020  Decreased Interest 0 0 0  Down, Depressed, Hopeless 0 0 0  PHQ - 2 Score 0 0 0  Altered sleeping 1 - -  Tired, decreased energy 1 - -  Change in appetite 0 - -  Feeling bad or failure about yourself  0 - -  Trouble concentrating 0 - -  Moving slowly or fidgety/restless 0 - -  Suicidal thoughts 0 - -  PHQ-9 Score 2 - -  Difficult doing work/chores Somewhat difficult - -    History Jannah has a past medical history of Anemia, Diverticulosis, GERD (gastroesophageal reflux disease), H/O adenoidectomy, Hemochromatosis (07/07/2011), Hyperlipidemia, Hypertension, Melanoma ([place]), and Thyroid disease.   She has a past surgical history that includes Tonsillectomy and adenoidectomy; Breast biopsy (2004); Melanoma excision (09/28/11); and Colonoscopy with propofol (N/A, 06/29/2020).   Her family history includes Heart disease in her mother.She reports that she quit smoking about 42 years ago. Her smoking use included cigarettes. She started smoking about 46 years ago. She has a 0.75 pack-year smoking history. She has never  used smokeless tobacco. She reports that she does not drink alcohol and does not use drugs.    ROS Review of Systems  Constitutional: Negative.   HENT: Negative.   Eyes: Negative for visual disturbance.  Respiratory: Negative for shortness of breath.   Cardiovascular: Negative for chest pain.  Gastrointestinal: Negative for abdominal pain.  Musculoskeletal: Positive for arthralgias (knees) and back pain (under care of Ortho).   Results for orders placed or performed in visit on 12/10/20  Thyroid Panel With TSH  Result Value Ref Range   TSH 2.210 0.450 - 4.500 uIU/mL   T4, Total 12.2 (H) 4.5 - 12.0 ug/dL   T3 Uptake Ratio 27 24 - 39 %   Free Thyroxine Index 3.3 1.2 - 4.9  Lipid panel  Result Value Ref Range   Cholesterol, Total 154 100 - 199 mg/dL   Triglycerides 161 (H) 0 - 149 mg/dL   HDL 57 >39 mg/dL   VLDL Cholesterol Cal 27 5 - 40 mg/dL   LDL Chol Calc (NIH) 70 0 - 99 mg/dL   Chol/HDL Ratio 2.7 0.0 - 4.4 ratio     Objective:  BP 113/73   Pulse 81   Temp 97.8 F (36.6 C)   Ht 5\' 3"  (1.6 m)   Wt 192 lb 3.2 oz (87.2 kg)   LMP 11/29/2011   SpO2 95%   BMI 34.05 kg/m   BP Readings from Last 3 Encounters:  12/14/20 113/73  10/04/20 125/81  07/06/20 131/80    Wt Readings from Last 3 Encounters:  12/14/20 192 lb  3.2 oz (87.2 kg)  10/04/20 191 lb 9.6 oz (86.9 kg)  07/06/20 186 lb 4 oz (84.5 kg)     Physical Exam Constitutional:      General: She is not in acute distress.    Appearance: She is well-developed.  Cardiovascular:     Rate and Rhythm: Normal rate and regular rhythm.  Pulmonary:     Breath sounds: Normal breath sounds.  Skin:    General: Skin is warm and dry.  Neurological:     Mental Status: She is alert and oriented to person, place, and time.       Assessment & Plan:   Corabelle was seen today for medical management of chronic issues.  Diagnoses and all orders for this visit:  Screening mammogram for breast cancer -     MM Digital  Screening; Future  Pure hypercholesterolemia -     rosuvastatin (CRESTOR) 20 MG tablet; TAKE 1 TABLET (20 MG TOTAL) BY MOUTH DAILY. -     icosapent Ethyl (VASCEPA) 1 g capsule; Take 1 capsule (1 g total) by mouth 2 (two) times daily.  Acquired hypothyroidism  Hypothyroidism due to medication -     levothyroxine (SYNTHROID) 125 MCG tablet; Take 1 tablet (125 mcg total) by mouth daily.  Essential hypertension -     losartan-hydrochlorothiazide (HYZAAR) 100-12.5 MG tablet; Take 1 tablet by mouth daily. as directed       I have changed Romie Levee. Erler's icosapent Ethyl and losartan-hydrochlorothiazide. I am also having her maintain her EPINEPHrine, ondansetron, naproxen, multivitamin with minerals, Vitamin D (Ergocalciferol), potassium chloride, montelukast, hyoscyamine, furosemide, Fluticasone-Salmeterol, esomeprazole, cetirizine, albuterol, rosuvastatin, and levothyroxine.  Allergies as of 12/14/2020      Reactions   Aspirin Swelling   Swelling of mouth and throat   Ace Inhibitors Other (See Comments)   Per [id number]   Actos [pioglitazone Hydrochloride] Other (See Comments)   Per pt: unknown   Alpha-gal Other (See Comments)   unknown   Ciprofloxacin Other (See Comments)   Unknown reaction   Crestor [rosuvastatin Calcium] Other (See Comments)   Per pt: unknown   Pneumococcal Vaccines Rash   Sulfonamide Derivatives Other (See Comments)   Swelling of mouth and throat      Medication List       Accurate as of Dec 14, 2020  9:29 AM. If you have any questions, ask your nurse or doctor.        albuterol 108 (90 Base) MCG/ACT inhaler Commonly known as: VENTOLIN HFA TAKE 2 PUFFS BY MOUTH EVERY 6 HOURS AS NEEDED   cetirizine 10 MG tablet Commonly known as: ZYRTEC Take 1 tablet (10 mg total) by mouth at bedtime.   EPINEPHrine 0.3 mg/0.3 mL Soaj injection Commonly known as: EPI-PEN Inject 0.3 mLs (0.3 mg total) into the muscle as needed for anaphylaxis.   esomeprazole  40 MG capsule Commonly known as: NexIUM Take 1 capsule (40 mg total) by mouth as needed.   Fluticasone-Salmeterol 100-50 MCG/DOSE Aepb Commonly known as: Advair Diskus TAKE 1 PUFF BY MOUTH TWICE A DAY   furosemide 40 MG tablet Commonly known as: LASIX Take 1 tablet (40 mg total) by mouth daily as needed.   hyoscyamine 0.125 MG SL tablet Commonly known as: LEVSIN SL Place 1 tablet (0.125 mg total) under the tongue as needed. Place 1-2 tablet under the tongue until it dissolves as needed   icosapent Ethyl 1 g capsule Commonly known as: VASCEPA Take 1 capsule (1 g total) by mouth 2 (two)  times daily.   levothyroxine 125 MCG tablet Commonly known as: SYNTHROID Take 1 tablet (125 mcg total) by mouth daily.   losartan-hydrochlorothiazide 100-12.5 MG tablet Commonly known as: HYZAAR Take 1 tablet by mouth daily. as directed   montelukast 10 MG tablet Commonly known as: SINGULAIR TAKE 1 TABLET BY MOUTH EVERYDAY AT BEDTIME   multivitamin with minerals tablet Take 1 tablet by mouth daily. One a day woman   naproxen 500 MG tablet Commonly known as: NAPROSYN Take 500 mg by mouth daily as needed (Back pain).   ondansetron 4 MG tablet Commonly known as: ZOFRAN Take 1 tablet (4 mg total) by mouth every 6 (six) hours as needed for nausea.   potassium chloride 10 MEQ tablet Commonly known as: KLOR-CON Take 1 tablet (10 mEq total) by mouth daily.   rosuvastatin 20 MG tablet Commonly known as: CRESTOR TAKE 1 TABLET (20 MG TOTAL) BY MOUTH DAILY.   Vitamin D (Ergocalciferol) 1.25 MG (50000 UNIT) Caps capsule Commonly known as: DRISDOL Take 1 capsule (50,000 Units total) by mouth every 7 (seven) days.        Follow-up: Return in about 6 months (around 06/16/2021).  Claretta Fraise, M.D.

## 2020-12-22 ENCOUNTER — Other Ambulatory Visit: Payer: Self-pay | Admitting: Family Medicine

## 2021-01-04 ENCOUNTER — Ambulatory Visit: Payer: BC Managed Care – PPO | Admitting: Family Medicine

## 2021-01-05 DIAGNOSIS — M542 Cervicalgia: Secondary | ICD-10-CM | POA: Diagnosis not present

## 2021-01-05 DIAGNOSIS — M546 Pain in thoracic spine: Secondary | ICD-10-CM | POA: Diagnosis not present

## 2021-01-18 DIAGNOSIS — M5134 Other intervertebral disc degeneration, thoracic region: Secondary | ICD-10-CM | POA: Diagnosis not present

## 2021-01-18 DIAGNOSIS — M5412 Radiculopathy, cervical region: Secondary | ICD-10-CM | POA: Diagnosis not present

## 2021-01-18 DIAGNOSIS — M5126 Other intervertebral disc displacement, lumbar region: Secondary | ICD-10-CM | POA: Diagnosis not present

## 2021-01-18 DIAGNOSIS — M5030 Other cervical disc degeneration, unspecified cervical region: Secondary | ICD-10-CM | POA: Diagnosis not present

## 2021-02-07 ENCOUNTER — Other Ambulatory Visit: Payer: Self-pay

## 2021-02-07 ENCOUNTER — Ambulatory Visit
Admission: RE | Admit: 2021-02-07 | Discharge: 2021-02-07 | Disposition: A | Discharge status: Home / Self Care | Payer: BC Managed Care – PPO | Source: Ambulatory Visit | Attending: Family Medicine | Admitting: Family Medicine

## 2021-02-07 DIAGNOSIS — Z1231 Encounter for screening mammogram for malignant neoplasm of breast: Secondary | ICD-10-CM

## 2021-02-11 ENCOUNTER — Other Ambulatory Visit: Payer: Self-pay | Admitting: Family Medicine

## 2021-02-11 DIAGNOSIS — R928 Other abnormal and inconclusive findings on diagnostic imaging of breast: Secondary | ICD-10-CM

## 2021-02-18 ENCOUNTER — Other Ambulatory Visit: Payer: Self-pay | Admitting: Family Medicine

## 2021-02-18 DIAGNOSIS — J42 Unspecified chronic bronchitis: Secondary | ICD-10-CM

## 2021-03-02 ENCOUNTER — Other Ambulatory Visit: Payer: BC Managed Care – PPO

## 2021-03-04 ENCOUNTER — Other Ambulatory Visit: Payer: Self-pay

## 2021-03-04 ENCOUNTER — Ambulatory Visit
Admission: RE | Admit: 2021-03-04 | Discharge: 2021-03-04 | Disposition: A | Discharge status: Home / Self Care | Payer: BC Managed Care – PPO | Source: Ambulatory Visit | Attending: Family Medicine | Admitting: Family Medicine

## 2021-03-04 DIAGNOSIS — R928 Other abnormal and inconclusive findings on diagnostic imaging of breast: Secondary | ICD-10-CM

## 2021-03-04 DIAGNOSIS — R922 Inconclusive mammogram: Secondary | ICD-10-CM | POA: Diagnosis not present

## 2021-03-11 ENCOUNTER — Encounter: Payer: Self-pay | Admitting: *Deleted

## 2021-04-06 DIAGNOSIS — G8918 Other acute postprocedural pain: Secondary | ICD-10-CM | POA: Diagnosis not present

## 2021-04-06 DIAGNOSIS — S83231A Complex tear of medial meniscus, current injury, right knee, initial encounter: Secondary | ICD-10-CM | POA: Diagnosis not present

## 2021-04-06 DIAGNOSIS — M948X6 Other specified disorders of cartilage, lower leg: Secondary | ICD-10-CM | POA: Diagnosis not present

## 2021-04-06 DIAGNOSIS — X58XXXA Exposure to other specified factors, initial encounter: Secondary | ICD-10-CM | POA: Diagnosis not present

## 2021-04-06 DIAGNOSIS — S83281A Other tear of lateral meniscus, current injury, right knee, initial encounter: Secondary | ICD-10-CM | POA: Diagnosis not present

## 2021-04-06 DIAGNOSIS — Y999 Unspecified external cause status: Secondary | ICD-10-CM | POA: Diagnosis not present

## 2021-04-06 DIAGNOSIS — M958 Other specified acquired deformities of musculoskeletal system: Secondary | ICD-10-CM | POA: Diagnosis not present

## 2021-04-06 DIAGNOSIS — M94261 Chondromalacia, right knee: Secondary | ICD-10-CM | POA: Diagnosis not present

## 2021-04-26 ENCOUNTER — Ambulatory Visit: Payer: BC Managed Care – PPO | Attending: Orthopedic Surgery | Admitting: Physical Therapy

## 2021-04-26 ENCOUNTER — Other Ambulatory Visit: Payer: Self-pay

## 2021-04-26 ENCOUNTER — Encounter: Payer: Self-pay | Admitting: Physical Therapy

## 2021-04-26 DIAGNOSIS — M25661 Stiffness of right knee, not elsewhere classified: Secondary | ICD-10-CM | POA: Diagnosis not present

## 2021-04-26 DIAGNOSIS — R600 Localized edema: Secondary | ICD-10-CM | POA: Diagnosis not present

## 2021-04-26 DIAGNOSIS — G8929 Other chronic pain: Secondary | ICD-10-CM | POA: Insufficient documentation

## 2021-04-26 DIAGNOSIS — M25561 Pain in right knee: Secondary | ICD-10-CM | POA: Insufficient documentation

## 2021-04-26 NOTE — Therapy (Signed)
[facility] [place], [place], [address] Phone: [phone]   Fax:  [phone]  Physical Therapy Evaluation  Patient Details  Name: Sarah Garrett MRN: [id number] Date of Birth: 1961-05-17 Referring Provider (PT): Esmond Plants MD   Encounter Date: 04/26/2021   PT End of Session - 04/26/21 1332     Visit Number 1    Number of Visits 12    Date for PT Re-Evaluation 05/24/21    PT Start Time 6144    PT Stop Time 3154    PT Time Calculation (min) 39 min    Activity Tolerance Patient tolerated treatment well    Behavior During Therapy [facility] for tasks assessed/performed             Past Medical History:  Diagnosis Date   Anemia    Diverticulosis    GERD (gastroesophageal reflux disease)    H/O adenoidectomy    Hemochromatosis 07/07/2011   Hyperlipidemia    Hypertension    Melanoma ([place])    right shoulder   Thyroid disease     Past Surgical History:  Procedure Laterality Date   BREAST BIOPSY  2004   left; benign   COLONOSCOPY WITH PROPOFOL N/A 06/29/2020   Procedure: COLONOSCOPY WITH PROPOFOL;  Surgeon: Rogene Houston, MD;  [facility];  Service: Endoscopy;  Laterality: N/A;   MELANOMA EXCISION  09/28/11   right shoulder   TONSILLECTOMY AND ADENOIDECTOMY      There were no vitals filed for this visit.    Subjective Assessment - 04/26/21 1327     Subjective COVID-19 screen performed prior to patient entering clinic.  The patient presents to the clinic today s/p right knee arthroscopic surgery performed on 04/06/21. She is pleased with her progress thus far.  She is currently on a FWW for safe ambualtion.  Her current pain-level is a 4/10 with pain increasing with increased up time.  She ices trhoughout the day which helps decrease her pain.  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  She is performing ankle pumps, quads sets, heel slides and SLR's as  part of her HEP.    Pertinent History Long h/o right knee pain, HTN, asthma, thyroid problem.    How long can you sit comfortably? Limiting this and lying and elevating her LE's.    How long can you walk comfortably? Around home with walker.    Patient Stated Goals "Recover from surgery."    Currently in Pain? Yes    Pain Score 4     Pain Location Knee    Pain Orientation Right    Pain Descriptors / Indicators Aching    Pain Type Surgical pain    Pain Onset 1 to 4 weeks ago    Pain Frequency Constant    Aggravating Factors  See above.    Pain Relieving Factors Ice and elevation.                [facility] PT Assessment - 04/26/21 0001       Assessment   Medical Diagnosis Right knee arthroscopic surgery    Referring Provider (PT) Esmond Plants MD    Onset Date/Surgical Date 04/06/21      Precautions   Precaution Comments PAIN-FREE RIGHT LE THER EX.      Restrictions   Weight Bearing Restrictions No      Balance Screen   Has the patient fallen  in the past 6 months No    Has the patient had a decrease in activity level because of a fear of falling?  No    Is the patient reluctant to leave their home because of a fear of falling?  No      Home Environment   Living Environment Private residence      Prior Function   Level of Independence Independent      Observation/Other Assessments   Observations Steri-strips and bandaids intact over patient's right knee.      Observation/Other Assessments-Edema    Edema Circumferential      Circumferential Edema   Circumferential - Right RT 1 cm > LT      ROM / Strength   AROM / PROM / Strength AROM;Strength      AROM   Overall AROM Comments In supine:  Full right knee extension and flexion to 100 degrees.  Left knee flexion to 125 degrees.      Strength   Overall Strength Comments Patient able to perform a right ankle gravity SLR without extensor lag.  Right hip flexion and abduction is normal.      Palpation   Palpation  comment Minimal palpable pain reported around her right patella.      Ambulation/Gait   Gait Comments Step- to gait pattern with a FWW.                        Objective measurements completed on examination: See above findings.       [facility] Adult PT Treatment/Exercise - 04/26/21 0001       Modalities   Modalities Vasopneumatic      Vasopneumatic   Number Minutes Vasopneumatic  15 minutes    Vasopnuematic Location  --   Right knee.   Vasopneumatic Pressure Low                          PT Long Term Goals - 04/26/21 1406       PT LONG TERM GOAL #1   Title Independent with a HEP.    Time 4    Period Weeks    Status New      PT LONG TERM GOAL #2   Title Rgith knee active flexion to 125 degrees.    Time 4    Period Weeks    Status New      PT LONG TERM GOAL #3   Title Perform ADL's with pain not > 2-3/10.    Time 4    Period Weeks    Status New      PT LONG TERM GOAL #4   Title Perform a reciprocating stair gait with one railing.    Time 4    Period Weeks    Status New      PT LONG TERM GOAL #5   Title Walk a community without assistive device.    Time 4    Period Weeks    Status New                    Plan - 04/26/21 1341     Clinical Impression Statement The patient presents to OPPT s/p right knee arthroscopic suregry performed on 04/06/21. She is currently walking safely with a FWW.  Her active right knee range of motion is 0 to 110 degrees.  She has minimal edema present today.  She is compliant to her HEP and  is able to perform a right antigravity SLR without extensor lag.  Patient will benefit from skilled physical therapy intervention to address pain and deficits.    Personal Factors and Comorbidities Comorbidity 1;Other    Comorbidities Long h/o right knee pain, HTN, asthma, thyroid problem.    Examination-Activity Limitations Other;Locomotion Level    Examination-Participation Restrictions Other     Stability/Clinical Decision Making Stable/Uncomplicated    Clinical Decision Making Low    Rehab Potential Excellent    PT Frequency 3x / week    PT Duration 4 weeks    PT Treatment/Interventions ADLs/Self Care Home Management;Cryotherapy;Electrical Stimulation;Gait training;Stair training;Functional mobility training;Therapeutic activities;Therapeutic exercise;Neuromuscular re-education;Manual techniques;Patient/family education;Passive range of motion    PT Next Visit Plan Nustep, pain-free right LE ther ex. (O and CKC)  May try VMS to right quadriceps.  PRE's.  Vasopneumatic.    Consulted and Agree with Plan of Care Patient             Patient will benefit from skilled therapeutic intervention in order to improve the following deficits and impairments:  Abnormal gait, Decreased mobility, Decreased activity tolerance, Decreased range of motion, Increased edema, Difficulty walking, Pain  Visit Diagnosis: Chronic pain of right knee - Plan: PT plan of care cert/re-cert  Stiffness of right knee, not elsewhere classified - Plan: PT plan of care cert/re-cert  Localized edema - Plan: PT plan of care cert/re-cert     Problem List Patient Active Problem List   Diagnosis Date Noted   Diverticulitis of intestine with perforation and abscess 03/22/2020   Hypokalemia 03/22/2020   Diverticulitis of large intestine with perforation without bleeding    Morbid obesity ([place]) 09/26/2019   Allergy to alpha-gal 03/14/2019   Contact dermatitis 03/24/2013   Asthma 03/24/2013   Melanoma of upper arm, right. T1a., History of 09/07/2011   Hemochromatosis 07/07/2011   Hypothyroidism 02/17/2008   Hyperlipidemia 10/11/2007   ANEMIA-NOS 10/11/2007   Essential hypertension 10/11/2007   ALLERGIC RHINITIS 10/11/2007   BRONCHITIS, CHRONIC 10/11/2007   GERD 10/11/2007   ADENOIDECTOMY, HX OF 10/11/2007    Berkleigh Beckles, [place], PT 04/26/2021, 2:16 PM  [facility] [address] [place], [place], [address] Phone: [phone]   Fax:  [phone]  Name: EVELENA MASCI MRN: [id number] Date of Birth: 03/23/1961

## 2021-04-28 ENCOUNTER — Ambulatory Visit: Payer: BC Managed Care – PPO | Admitting: *Deleted

## 2021-04-28 ENCOUNTER — Other Ambulatory Visit: Payer: Self-pay

## 2021-04-28 DIAGNOSIS — R600 Localized edema: Secondary | ICD-10-CM

## 2021-04-28 DIAGNOSIS — M25661 Stiffness of right knee, not elsewhere classified: Secondary | ICD-10-CM

## 2021-04-28 DIAGNOSIS — G8929 Other chronic pain: Secondary | ICD-10-CM

## 2021-04-28 DIAGNOSIS — M25561 Pain in right knee: Secondary | ICD-10-CM | POA: Diagnosis not present

## 2021-04-28 NOTE — Therapy (Signed)
[facility] [place], [place], [address] Phone: [phone]   Fax:  [phone]  Physical Therapy Treatment  Patient Details  Name: Sarah Garrett MRN: [id number] Date of Birth: [birth year] Referring Provider (PT): Esmond Plants MD   Encounter Date: 04/28/2021   PT End of Session - 04/28/21 1509     Visit Number 2    Number of Visits 12    Date for PT Re-Evaluation 05/24/21    PT Start Time 1430    PT Stop Time 1520    PT Time Calculation (min) 50 min             Past Medical History:  Diagnosis Date   Anemia    Diverticulosis    GERD (gastroesophageal reflux disease)    H/O adenoidectomy    Hemochromatosis 07/07/2011   Hyperlipidemia    Hypertension    Melanoma ([place])    right shoulder   Thyroid disease     Past Surgical History:  Procedure Laterality Date   BREAST BIOPSY  2004   left; benign   COLONOSCOPY WITH PROPOFOL N/A 06/29/2020   Procedure: COLONOSCOPY WITH PROPOFOL;  Surgeon: Rogene Houston, MD;  [facility];  Service: Endoscopy;  Laterality: N/A;   MELANOMA EXCISION  09/28/11   right shoulder   TONSILLECTOMY AND ADENOIDECTOMY      There were no vitals filed for this visit.   Subjective Assessment - 04/28/21 1432     Subjective COVID-19 screen performed prior to patient entering clinic.  The patient presents to the clinic today s/p right knee arthroscopic surgery performed on 04/06/21. Pain today 5/10 hurting in the back    Pertinent History Long h/o right knee pain, HTN, asthma, thyroid problem.    How long can you sit comfortably? Limiting this and lying and elevating her LE's.    How long can you walk comfortably? Around home with walker.    Patient Stated Goals "Recover from surgery."    Currently in Pain? Yes    Pain Score 4     Pain Location Knee    Pain Orientation Right    Pain Descriptors / Indicators Aching;Sore    Pain Type Surgical pain    Pain Onset 1 to 4  weeks ago                               [name] Adult PT Treatment/Exercise - 04/28/21 0001       Exercises   Exercises Knee/Hip      Knee/Hip Exercises: Aerobic   Nustep L3 x 15 mins      Knee/Hip Exercises: Standing   Heel Raises Both;2 sets;10 reps    Heel Raises Limitations toe ups 2x10      Knee/Hip Exercises: Seated   Long Arc Quad AROM;Right;2 sets;10 reps   hold 5 secs     Knee/Hip Exercises: Sidelying   Hip ABduction Right;3 sets;10 reps      Modalities   Modalities Vasopneumatic      Vasopneumatic   Number Minutes Vasopneumatic  15 minutes    Vasopnuematic Location  --   Right knee.   Vasopneumatic Pressure Low    Vasopneumatic Temperature  34                          PT Long Term Goals - 04/26/21 1406       PT  LONG TERM GOAL #1   Title Independent with a HEP.    Time 4    Period Weeks    Status New      PT LONG TERM GOAL #2   Title Rgith knee active flexion to 125 degrees.    Time 4    Period Weeks    Status New      PT LONG TERM GOAL #3   Title Perform ADL's with pain not > 2-3/10.    Time 4    Period Weeks    Status New      PT LONG TERM GOAL #4   Title Perform a reciprocating stair gait with one railing.    Time 4    Period Weeks    Status New      PT LONG TERM GOAL #5   Title Walk a community without assistive device.    Time 4    Period Weeks    Status New                   Plan - 04/28/21 1513     Clinical Impression Statement Pt arrived today doing fair with RT knee, but reports posteriorknee pain today. She was able to perform heel ups and toe ups without increased knee pain. Rx focused on ROM and quad activation as well. Normal Vaso response today    Personal Factors and Comorbidities Comorbidity 1;Other    Comorbidities Long h/o right knee pain, HTN, asthma, thyroid problem.    Examination-Activity Limitations Other;Locomotion Level    Examination-Participation Restrictions Other     Rehab Potential Excellent    PT Frequency 3x / week    PT Duration 4 weeks    PT Treatment/Interventions ADLs/Self Care Home Management;Cryotherapy;Electrical Stimulation;Gait training;Stair training;Functional mobility training;Therapeutic activities;Therapeutic exercise;Neuromuscular re-education;Manual techniques;Patient/family education;Passive range of motion    PT Next Visit Plan Nustep, pain-free right LE ther ex. (O and CKC)  May try VMS to right quadriceps.  PRE's.  Vasopneumatic.    Consulted and Agree with Plan of Care Patient             Patient will benefit from skilled therapeutic intervention in order to improve the following deficits and impairments:  Abnormal gait, Decreased mobility, Decreased activity tolerance, Decreased range of motion, Increased edema, Difficulty walking, Pain  Visit Diagnosis: Chronic pain of right knee  Stiffness of right knee, not elsewhere classified  Localized edema     Problem List Patient Active Problem List   Diagnosis Date Noted   Diverticulitis of intestine with perforation and abscess 03/22/2020   Hypokalemia 03/22/2020   Diverticulitis of large intestine with perforation without bleeding    Morbid obesity ([place]) 09/26/2019   Allergy to alpha-gal 03/14/2019   Contact dermatitis 03/24/2013   Asthma 03/24/2013   Melanoma of upper arm, right. T1a., History of 09/07/2011   Hemochromatosis 07/07/2011   Hypothyroidism 02/17/2008   Hyperlipidemia 10/11/2007   ANEMIA-NOS 10/11/2007   Essential hypertension 10/11/2007   ALLERGIC RHINITIS 10/11/2007   BRONCHITIS, CHRONIC 10/11/2007   GERD 10/11/2007   ADENOIDECTOMY, HX OF 10/11/2007    Kalynne Womac,CHRIS, PTA 04/28/2021, 3:26 PM  [facility] [address] [place], [place], [address] Phone: [phone]   Fax:  [phone]  Name: Sarah Garrett MRN: [id number] Date of Birth: 06-08-1961

## 2021-05-03 ENCOUNTER — Other Ambulatory Visit: Payer: Self-pay

## 2021-05-03 ENCOUNTER — Ambulatory Visit: Payer: BC Managed Care – PPO | Attending: Orthopedic Surgery | Admitting: *Deleted

## 2021-05-03 DIAGNOSIS — M25561 Pain in right knee: Secondary | ICD-10-CM | POA: Insufficient documentation

## 2021-05-03 DIAGNOSIS — M25661 Stiffness of right knee, not elsewhere classified: Secondary | ICD-10-CM | POA: Insufficient documentation

## 2021-05-03 DIAGNOSIS — G8929 Other chronic pain: Secondary | ICD-10-CM | POA: Insufficient documentation

## 2021-05-03 DIAGNOSIS — R600 Localized edema: Secondary | ICD-10-CM | POA: Diagnosis not present

## 2021-05-03 NOTE — Therapy (Signed)
[facility] [place], [place], [address] Phone: [phone]   Fax:  [phone]  Physical Therapy Treatment  Patient Details  Name: Sarah Garrett MRN: [id number] Date of Birth: 1960/11/24 Referring Provider (PT): Esmond Plants MD   Encounter Date: 05/03/2021   PT End of Session - 05/03/21 1403     Visit Number 3    Number of Visits 12    Date for PT Re-Evaluation 05/24/21    PT Start Time 1300    PT Stop Time 1350    PT Time Calculation (Sarah) 50 Sarah             Past Medical History:  Diagnosis Date   Anemia    Diverticulosis    GERD (gastroesophageal reflux disease)    H/O adenoidectomy    Hemochromatosis 07/07/2011   Hyperlipidemia    Hypertension    Melanoma ([place])    right shoulder   Thyroid disease     Past Surgical History:  Procedure Laterality Date   BREAST BIOPSY  2004   left; benign   COLONOSCOPY WITH PROPOFOL N/A 06/29/2020   Procedure: COLONOSCOPY WITH PROPOFOL;  Surgeon: Rogene Houston, MD;  [facility];  Service: Endoscopy;  Laterality: N/A;   MELANOMA EXCISION  09/28/11   right shoulder   TONSILLECTOMY AND ADENOIDECTOMY      There were no vitals filed for this visit.   Subjective Assessment - 05/03/21 1316     Subjective COVID-19 screen performed prior to patient entering clinic.  The patient presents to the clinic today s/p right knee arthroscopic surgery performed on 04/06/21. Pain today 5/10 hurting in the back    Pertinent History Long h/o right knee pain, HTN, asthma, thyroid problem.    How long can you sit comfortably? Limiting this and lying and elevating her LE's.    How long can you walk comfortably? Around home with walker.                               [facility] Adult PT Treatment/Exercise - 05/03/21 0001       Exercises   Exercises Knee/Hip      Knee/Hip Exercises: Aerobic   Nustep L3 x 15 mins      Knee/Hip Exercises: Standing    Heel Raises Both;2 sets;10 reps    Heel Raises Limitations toe ups 2x10    Rocker Board 2 minutes      Knee/Hip Exercises: Seated   Long Arc Quad AROM;Right;2 sets;10 reps   hold 3 secs   Long Arc Quad Weight 2 lbs.      Knee/Hip Exercises: Supine   Terminal Knee Extension AROM;Right   Rock backs onto RT foot/heel     Knee/Hip Exercises: Sidelying   Hip ABduction Right;3 sets;10 reps      Modalities   Modalities Vasopneumatic      Vasopneumatic   Number Minutes Vasopneumatic  15 minutes    Vasopnuematic Location  --   Right knee.   Vasopneumatic Pressure Low    Vasopneumatic Temperature  34                          PT Long Term Goals - 04/26/21 1406       PT LONG TERM GOAL #1   Title Independent with a HEP.    Time 4    Period Weeks  Status New      PT LONG TERM GOAL #2   Title Rgith knee active flexion to 125 degrees.    Time 4    Period Weeks    Status New      PT LONG TERM GOAL #3   Title Perform ADL's with pain not > 2-3/10.    Time 4    Period Weeks    Status New      PT LONG TERM GOAL #4   Title Perform a reciprocating stair gait with one railing.    Time 4    Period Weeks    Status New      PT LONG TERM GOAL #5   Title Walk a community without assistive device.    Time 4    Period Weeks    Status New                    Patient will benefit from skilled therapeutic intervention in order to improve the following deficits and impairments:     Visit Diagnosis: Chronic pain of right knee  Stiffness of right knee, not elsewhere classified  Localized edema     Problem List Patient Active Problem List   Diagnosis Date Noted   Diverticulitis of intestine with perforation and abscess 03/22/2020   Hypokalemia 03/22/2020   Diverticulitis of large intestine with perforation without bleeding    Morbid obesity ([place]) 09/26/2019   Allergy to alpha-gal 03/14/2019   Contact dermatitis 03/24/2013   Asthma 03/24/2013    Melanoma of upper arm, right. T1a., History of 09/07/2011   Hemochromatosis 07/07/2011   Hypothyroidism 02/17/2008   Hyperlipidemia 10/11/2007   ANEMIA-NOS 10/11/2007   Essential hypertension 10/11/2007   ALLERGIC RHINITIS 10/11/2007   BRONCHITIS, CHRONIC 10/11/2007   GERD 10/11/2007   ADENOIDECTOMY, HX OF 10/11/2007    Burdett Pinzon,CHRIS, PTA 05/03/2021, 2:06 PM  [facility] [address] [place], [place], [address] Phone: [phone]   Fax:  [phone]  Name: AILAH BARNA MRN: [id number] Date of Birth: 1960/11/10

## 2021-05-05 DIAGNOSIS — D485 Neoplasm of uncertain behavior of skin: Secondary | ICD-10-CM | POA: Diagnosis not present

## 2021-05-05 DIAGNOSIS — D225 Melanocytic nevi of trunk: Secondary | ICD-10-CM | POA: Diagnosis not present

## 2021-05-05 DIAGNOSIS — L820 Inflamed seborrheic keratosis: Secondary | ICD-10-CM | POA: Diagnosis not present

## 2021-05-05 DIAGNOSIS — L570 Actinic keratosis: Secondary | ICD-10-CM | POA: Diagnosis not present

## 2021-05-05 DIAGNOSIS — Z85820 Personal history of malignant melanoma of skin: Secondary | ICD-10-CM | POA: Diagnosis not present

## 2021-05-05 DIAGNOSIS — L738 Other specified follicular disorders: Secondary | ICD-10-CM | POA: Diagnosis not present

## 2021-05-05 DIAGNOSIS — B0089 Other herpesviral infection: Secondary | ICD-10-CM | POA: Diagnosis not present

## 2021-05-06 ENCOUNTER — Ambulatory Visit: Payer: BC Managed Care – PPO | Admitting: *Deleted

## 2021-05-06 ENCOUNTER — Other Ambulatory Visit: Payer: Self-pay

## 2021-05-06 DIAGNOSIS — M25661 Stiffness of right knee, not elsewhere classified: Secondary | ICD-10-CM

## 2021-05-06 DIAGNOSIS — G8929 Other chronic pain: Secondary | ICD-10-CM | POA: Diagnosis not present

## 2021-05-06 DIAGNOSIS — M25561 Pain in right knee: Secondary | ICD-10-CM | POA: Diagnosis not present

## 2021-05-06 DIAGNOSIS — R600 Localized edema: Secondary | ICD-10-CM | POA: Diagnosis not present

## 2021-05-06 NOTE — Therapy (Signed)
[facility] [place], [place], [address] Phone: [phone]   Fax:  [phone]  Physical Therapy Treatment  Patient Details  Name: Sarah Garrett MRN: [id number] Date of Birth: 29-Apr-1961 Referring Provider (PT): Esmond Plants MD   Encounter Date: 05/06/2021   PT End of Session - 05/06/21 1217     Visit Number 4    Number of Visits 12    Date for PT Re-Evaluation 05/24/21    PT Start Time 1115    PT Stop Time 3428    PT Time Calculation (min) 50 min             Past Medical History:  Diagnosis Date   Anemia    Diverticulosis    GERD (gastroesophageal reflux disease)    H/O adenoidectomy    Hemochromatosis 07/07/2011   Hyperlipidemia    Hypertension    Melanoma ([place])    right shoulder   Thyroid disease     Past Surgical History:  Procedure Laterality Date   BREAST BIOPSY  2004   left; benign   COLONOSCOPY WITH PROPOFOL N/A 06/29/2020   Procedure: COLONOSCOPY WITH PROPOFOL;  Surgeon: Rogene Houston, MD;  [facility];  Service: Endoscopy;  Laterality: N/A;   MELANOMA EXCISION  09/28/11   right shoulder   TONSILLECTOMY AND ADENOIDECTOMY      There were no vitals filed for this visit.   Subjective Assessment - 05/06/21 1144     Subjective COVID-19 screen performed prior to patient entering clinic. Pain 3/10    Pertinent History Long h/o right knee pain, HTN, asthma, thyroid problem.    How long can you sit comfortably? Limiting this and lying and elevating her LE's.    How long can you walk comfortably? Around home with walker.    Patient Stated Goals "Recover from surgery."    Currently in Pain? Yes    Pain Score 3     Pain Location Knee    Pain Orientation Right    Pain Descriptors / Indicators Aching;Sore    Pain Type Surgical pain    Pain Onset 1 to 4 weeks ago                               [facility] Adult PT Treatment/Exercise - 05/06/21 0001        Exercises   Exercises Knee/Hip      Knee/Hip Exercises: Aerobic   Nustep L3 x 15 mins      Knee/Hip Exercises: Standing   Heel Raises Both;10 reps;3 sets    Heel Raises Limitations toe ups 3x10    Forward Step Up Right;10 reps;3 sets;Hand Hold: 2;Step Height: 4"    Rocker Board 5 minutes   PF/DF, balance     Knee/Hip Exercises: Seated   Long Arc Quad AROM;Right;2 sets;10 reps   hold 3 secs   Long Arc Quad Weight 2 lbs.      Modalities   Modalities Vasopneumatic      Vasopneumatic   Number Minutes Vasopneumatic  15 minutes    Vasopnuematic Location  --   Right knee.   Vasopneumatic Pressure Low    Vasopneumatic Temperature  34      Manual Therapy   Manual therapy comments ROM to 110 degrees today                          PT  Long Term Goals - 04/26/21 1406       PT LONG TERM GOAL #1   Title Independent with a HEP.    Time 4    Period Weeks    Status New      PT LONG TERM GOAL #2   Title Rgith knee active flexion to 125 degrees.    Time 4    Period Weeks    Status New      PT LONG TERM GOAL #3   Title Perform ADL's with pain not > 2-3/10.    Time 4    Period Weeks    Status New      PT LONG TERM GOAL #4   Title Perform a reciprocating stair gait with one railing.    Time 4    Period Weeks    Status New      PT LONG TERM GOAL #5   Title Walk a community without assistive device.    Time 4    Period Weeks    Status New                   Plan - 05/06/21 1218     Clinical Impression Statement Pt arrived today with RW, but reports using SPC some at home. Rx focused on ROM and mmactivation and light strengthening. Gait with SPC also practiced in clinic. Normal Vaso responseend of session    Personal Factors and Comorbidities Comorbidity 1;Other    Comorbidities Long h/o right knee pain, HTN, asthma, thyroid problem.    Examination-Participation Restrictions Other    Stability/Clinical Decision Making Stable/Uncomplicated    Rehab  Potential Excellent    PT Frequency 3x / week    PT Duration 4 weeks    PT Treatment/Interventions ADLs/Self Care Home Management;Cryotherapy;Electrical Stimulation;Gait training;Stair training;Functional mobility training;Therapeutic activities;Therapeutic exercise;Neuromuscular re-education;Manual techniques;Patient/family education;Passive range of motion             Patient will benefit from skilled therapeutic intervention in order to improve the following deficits and impairments:  Abnormal gait, Decreased mobility, Decreased activity tolerance, Decreased range of motion, Increased edema, Difficulty walking, Pain  Visit Diagnosis: Chronic pain of right knee  Stiffness of right knee, not elsewhere classified  Localized edema     Problem List Patient Active Problem List   Diagnosis Date Noted   Diverticulitis of intestine with perforation and abscess 03/22/2020   Hypokalemia 03/22/2020   Diverticulitis of large intestine with perforation without bleeding    Morbid obesity ([place]) 09/26/2019   Allergy to alpha-gal 03/14/2019   Contact dermatitis 03/24/2013   Asthma 03/24/2013   Melanoma of upper arm, right. T1a., History of 09/07/2011   Hemochromatosis 07/07/2011   Hypothyroidism 02/17/2008   Hyperlipidemia 10/11/2007   ANEMIA-NOS 10/11/2007   Essential hypertension 10/11/2007   ALLERGIC RHINITIS 10/11/2007   BRONCHITIS, CHRONIC 10/11/2007   GERD 10/11/2007   ADENOIDECTOMY, HX OF 10/11/2007    Ramani Riva,CHRIS, PTA 05/06/2021, 12:22 PM  [facility] [address] [place], [place], [address] Phone: [phone]   Fax:  [phone]  Name: ADYLINE HUBERTY MRN: [id number] Date of Birth: 1961-02-10

## 2021-05-10 ENCOUNTER — Ambulatory Visit: Payer: BC Managed Care – PPO | Admitting: Physical Therapy

## 2021-05-10 ENCOUNTER — Encounter: Payer: Self-pay | Admitting: Physical Therapy

## 2021-05-10 ENCOUNTER — Other Ambulatory Visit: Payer: Self-pay

## 2021-05-10 DIAGNOSIS — R600 Localized edema: Secondary | ICD-10-CM | POA: Diagnosis not present

## 2021-05-10 DIAGNOSIS — G8929 Other chronic pain: Secondary | ICD-10-CM

## 2021-05-10 DIAGNOSIS — M25561 Pain in right knee: Secondary | ICD-10-CM | POA: Diagnosis not present

## 2021-05-10 DIAGNOSIS — M25661 Stiffness of right knee, not elsewhere classified: Secondary | ICD-10-CM

## 2021-05-10 NOTE — Therapy (Signed)
[facility] [place], [place], [address] Phone: [phone]   Fax:  [phone]  Physical Therapy Treatment  Patient Details  Name: Sarah Garrett MRN: [id number] Date of Birth: 19-Jul-1961 Referring Provider (PT): Esmond Plants MD   Encounter Date: 05/10/2021   PT End of Session - 05/10/21 1306     Visit Number 5    Number of Visits 12    Date for PT Re-Evaluation 05/24/21    PT Start Time 7026    PT Stop Time 3785    PT Time Calculation (min) 44 min    Equipment Utilized During Treatment Other (comment)   [place]   Activity Tolerance Patient tolerated treatment well    Behavior During Therapy [facility] for tasks assessed/performed             Past Medical History:  Diagnosis Date   Anemia    Diverticulosis    GERD (gastroesophageal reflux disease)    H/O adenoidectomy    Hemochromatosis 07/07/2011   Hyperlipidemia    Hypertension    Melanoma ([place])    right shoulder   Thyroid disease     Past Surgical History:  Procedure Laterality Date   BREAST BIOPSY  2004   left; benign   COLONOSCOPY WITH PROPOFOL N/A 06/29/2020   Procedure: COLONOSCOPY WITH PROPOFOL;  Surgeon: Rogene Houston, MD;  [facility];  Service: Endoscopy;  Laterality: N/A;   MELANOMA EXCISION  09/28/11   right shoulder   TONSILLECTOMY AND ADENOIDECTOMY      There were no vitals filed for this visit.   Subjective Assessment - 05/10/21 1304     Subjective COVID-19 screen performed prior to patient entering clinic. Using cane at home and brought to PT today. Knee still feels tight and swelling throughout.    Pertinent History Long h/o right knee pain, HTN, asthma, thyroid problem.    How long can you sit comfortably? Limiting this and lying and elevating her LE's.    How long can you walk comfortably? Around home with walker.    Patient Stated Goals "Recover from surgery."    Currently in Pain? No/denies                 [facility] PT Assessment - 05/10/21 0001       Assessment   Medical Diagnosis Right knee arthroscopic surgery    Referring Provider (PT) Esmond Plants MD    Onset Date/Surgical Date 04/06/21    Next MD Visit 05/19/2021      Precautions   Precaution Comments PAIN-FREE RIGHT LE THER EX.      Restrictions   Weight Bearing Restrictions No                           [facility] Adult PT Treatment/Exercise - 05/10/21 0001       Knee/Hip Exercises: Aerobic   Nustep L4, seat 8 x10 min      Knee/Hip Exercises: Standing   Forward Step Up Right;2 sets;10 reps;Hand Hold: 2;Step Height: 4"    Step Down Right;15 reps;Hand Hold: 2;Step Height: 4"    Rocker Board 4 minutes      Knee/Hip Exercises: Supine   Short Arc Quad Sets AROM;Right;3 sets;10 reps    Straight Leg Raises AROM;Right;2 sets;10 reps      Modalities   Modalities Vasopneumatic      Vasopneumatic   Number Minutes Vasopneumatic  10 minutes    Vasopnuematic Location  Knee    Vasopneumatic Pressure Low    Vasopneumatic Temperature  34                          PT Long Term Goals - 04/26/21 1406       PT LONG TERM GOAL #1   Title Independent with a HEP.    Time 4    Period Weeks    Status New      PT LONG TERM GOAL #2   Title Rgith knee active flexion to 125 degrees.    Time 4    Period Weeks    Status New      PT LONG TERM GOAL #3   Title Perform ADL's with pain not > 2-3/10.    Time 4    Period Weeks    Status New      PT LONG TERM GOAL #4   Title Perform a reciprocating stair gait with one railing.    Time 4    Period Weeks    Status New      PT LONG TERM GOAL #5   Title Walk a community without assistive device.    Time 4    Period Weeks    Status New                   Plan - 05/10/21 1342     Clinical Impression Statement Patient presented in clinic with reports of intermittant popping of R knee. Patient able to tolerate therex fairly well although audible  popping and minimal discomfort reported during step ups. Patient able to demonstrate a proper quad activation and control. Normal vasopneumatic response noted following removal of the modality. Patient using SPC now as well with slow speed as she is still gaining confidence with SPC. More popping reported by patient is weightbearing.    Personal Factors and Comorbidities Comorbidity 1;Other    Comorbidities Long h/o right knee pain, HTN, asthma, thyroid problem.    Examination-Activity Limitations Other;Locomotion Level    Examination-Participation Restrictions Other    Stability/Clinical Decision Making Stable/Uncomplicated    Rehab Potential Excellent    PT Frequency 3x / week    PT Duration 4 weeks    PT Treatment/Interventions ADLs/Self Care Home Management;Cryotherapy;Electrical Stimulation;Gait training;Stair training;Functional mobility training;Therapeutic activities;Therapeutic exercise;Neuromuscular re-education;Manual techniques;Patient/family education;Passive range of motion    PT Next Visit Plan Nustep, pain-free right LE ther ex. (O and CKC)  May try VMS to right quadriceps.  PRE's.  Vasopneumatic.    Consulted and Agree with Plan of Care Patient             Patient will benefit from skilled therapeutic intervention in order to improve the following deficits and impairments:  Abnormal gait, Decreased mobility, Decreased activity tolerance, Decreased range of motion, Increased edema, Difficulty walking, Pain  Visit Diagnosis: Chronic pain of right knee  Stiffness of right knee, not elsewhere classified  Localized edema     Problem List Patient Active Problem List   Diagnosis Date Noted   Diverticulitis of intestine with perforation and abscess 03/22/2020   Hypokalemia 03/22/2020   Diverticulitis of large intestine with perforation without bleeding    Morbid obesity ([place]) 09/26/2019   Allergy to alpha-gal 03/14/2019   Contact dermatitis 03/24/2013   Asthma  03/24/2013   Melanoma of upper arm, right. T1a., History of 09/07/2011   Hemochromatosis 07/07/2011   Hypothyroidism 02/17/2008   Hyperlipidemia 10/11/2007   ANEMIA-NOS 10/11/2007   Essential  hypertension 10/11/2007   ALLERGIC RHINITIS 10/11/2007   BRONCHITIS, CHRONIC 10/11/2007   GERD 10/11/2007   ADENOIDECTOMY, HX OF 10/11/2007    Standley Brooking, PTA 05/10/2021, 1:52 PM  [facility] [address] [place], [place], [address] Phone: [phone]   Fax:  [phone]  Name: Sarah Garrett MRN: [id number] Date of Birth: 1961/03/04

## 2021-05-13 ENCOUNTER — Ambulatory Visit: Payer: BC Managed Care – PPO | Admitting: *Deleted

## 2021-05-13 ENCOUNTER — Other Ambulatory Visit: Payer: Self-pay

## 2021-05-13 DIAGNOSIS — M25661 Stiffness of right knee, not elsewhere classified: Secondary | ICD-10-CM

## 2021-05-13 DIAGNOSIS — G8929 Other chronic pain: Secondary | ICD-10-CM

## 2021-05-13 DIAGNOSIS — M25561 Pain in right knee: Secondary | ICD-10-CM

## 2021-05-13 DIAGNOSIS — R600 Localized edema: Secondary | ICD-10-CM | POA: Diagnosis not present

## 2021-05-13 NOTE — Therapy (Signed)
[facility] [place], [place], [address] Phone: [phone]   Fax:  [phone]  Physical Therapy Treatment  Patient Details  Name: Sarah Garrett MRN: [id number] Date of Birth: March 03, 1961 Referring Provider (PT): Esmond Plants MD   Encounter Date: 05/13/2021   PT End of Session - 05/13/21 1135     Visit Number 6    Number of Visits 12    Date for PT Re-Evaluation 05/24/21    PT Start Time 1115    PT Stop Time 7939    PT Time Calculation (min) 50 min             Past Medical History:  Diagnosis Date   Anemia    Diverticulosis    GERD (gastroesophageal reflux disease)    H/O adenoidectomy    Hemochromatosis 07/07/2011   Hyperlipidemia    Hypertension    Melanoma ([place])    right shoulder   Thyroid disease     Past Surgical History:  Procedure Laterality Date   BREAST BIOPSY  2004   left; benign   COLONOSCOPY WITH PROPOFOL N/A 06/29/2020   Procedure: COLONOSCOPY WITH PROPOFOL;  Surgeon: Rogene Houston, MD;  [facility];  Service: Endoscopy;  Laterality: N/A;   MELANOMA EXCISION  09/28/11   right shoulder   TONSILLECTOMY AND ADENOIDECTOMY      There were no vitals filed for this visit.   Subjective Assessment - 05/13/21 1133     Subjective COVID-19 screen performed prior to patient entering clinic.Doing better    Pertinent History Long h/o right knee pain, HTN, asthma, thyroid problem.    How long can you sit comfortably? Limiting this and lying and elevating her LE's.    How long can you walk comfortably? Around home with walker.    Patient Stated Goals "Recover from surgery."    Currently in Pain? Yes    Pain Score 2     Pain Location Knee    Pain Orientation Right    Pain Descriptors / Indicators Aching;Sore    Pain Type Surgical pain    Pain Onset 1 to 4 weeks ago                               [facility] Adult PT Treatment/Exercise - 05/13/21 0001        Exercises   Exercises Knee/Hip      Knee/Hip Exercises: Aerobic   Nustep L5, seat 8,7  x15 min      Knee/Hip Exercises: Standing   Heel Raises Both;10 reps;3 sets    Heel Raises Limitations toe ups 3x10    Forward Lunges Right;1 set;10 reps   14in box   Forward Step Up Right;2 sets;10 reps;Hand Hold: 2;Step Height: 4"    Step Down Right;15 reps;Hand Hold: 2;Step Height: 4"    Rocker Board 4 minutes      Modalities   Modalities Vasopneumatic      Vasopneumatic   Number Minutes Vasopneumatic  10 minutes    Vasopnuematic Location  Knee    Vasopneumatic Pressure Low    Vasopneumatic Temperature  34      Manual Therapy   Manual therapy comments 0-115 degrees PROM                          PT Long Term Goals - 04/26/21 1406       PT LONG  TERM GOAL #1   Title Independent with a HEP.    Time 4    Period Weeks    Status New      PT LONG TERM GOAL #2   Title Rgith knee active flexion to 125 degrees.    Time 4    Period Weeks    Status New      PT LONG TERM GOAL #3   Title Perform ADL's with pain not > 2-3/10.    Time 4    Period Weeks    Status New      PT LONG TERM GOAL #4   Title Perform a reciprocating stair gait with one railing.    Time 4    Period Weeks    Status New      PT LONG TERM GOAL #5   Title Walk a community without assistive device.    Time 4    Period Weeks    Status New                   Plan - 05/13/21 1205     Clinical Impression Statement Pt arrived today doing very well and continues to progress with ROM and quad control. She does have some audible popping at times, but no increased pain. PROM today was 0-115 degrees. Normal Vaso response.    Personal Factors and Comorbidities Comorbidity 1;Other    Comorbidities Long h/o right knee pain, HTN, asthma, thyroid problem.    Examination-Activity Limitations Other;Locomotion Level    Examination-Participation Restrictions Other    Stability/Clinical Decision Making  Stable/Uncomplicated    Rehab Potential Excellent    PT Frequency 3x / week    PT Treatment/Interventions ADLs/Self Care Home Management;Cryotherapy;Electrical Stimulation;Gait training;Stair training;Functional mobility training;Therapeutic activities;Therapeutic exercise;Neuromuscular re-education;Manual techniques;Patient/family education;Passive range of motion    PT Next Visit Plan Nustep, pain-free right LE ther ex. (O and CKC)  .  PRE's.  Vasopneumatic.             Patient will benefit from skilled therapeutic intervention in order to improve the following deficits and impairments:  Abnormal gait, Decreased mobility, Decreased activity tolerance, Decreased range of motion, Increased edema, Difficulty walking, Pain  Visit Diagnosis: Chronic pain of right knee  Stiffness of right knee, not elsewhere classified  Localized edema     Problem List Patient Active Problem List   Diagnosis Date Noted   Diverticulitis of intestine with perforation and abscess 03/22/2020   Hypokalemia 03/22/2020   Diverticulitis of large intestine with perforation without bleeding    Morbid obesity ([place]) 09/26/2019   Allergy to alpha-gal 03/14/2019   Contact dermatitis 03/24/2013   Asthma 03/24/2013   Melanoma of upper arm, right. T1a., History of 09/07/2011   Hemochromatosis 07/07/2011   Hypothyroidism 02/17/2008   Hyperlipidemia 10/11/2007   ANEMIA-NOS 10/11/2007   Essential hypertension 10/11/2007   ALLERGIC RHINITIS 10/11/2007   BRONCHITIS, CHRONIC 10/11/2007   GERD 10/11/2007   ADENOIDECTOMY, HX OF 10/11/2007    Lakashia Collison,CHRIS, PTA 05/13/2021, 12:15 PM  [facility] [address] [place], [place], [address] Phone: [phone]   Fax:  [phone]  Name: LOREDA SILVERIO MRN: [id number] Date of Birth: Mar 24, 1961

## 2021-05-16 ENCOUNTER — Ambulatory Visit: Payer: BC Managed Care – PPO | Admitting: Physical Therapy

## 2021-05-16 ENCOUNTER — Other Ambulatory Visit: Payer: Self-pay

## 2021-05-16 ENCOUNTER — Encounter: Payer: Self-pay | Admitting: Physical Therapy

## 2021-05-16 DIAGNOSIS — G8929 Other chronic pain: Secondary | ICD-10-CM | POA: Diagnosis not present

## 2021-05-16 DIAGNOSIS — R600 Localized edema: Secondary | ICD-10-CM | POA: Diagnosis not present

## 2021-05-16 DIAGNOSIS — M25661 Stiffness of right knee, not elsewhere classified: Secondary | ICD-10-CM

## 2021-05-16 DIAGNOSIS — M25561 Pain in right knee: Secondary | ICD-10-CM | POA: Diagnosis not present

## 2021-05-16 NOTE — Therapy (Signed)
[facility] [place], [place], [address] Phone: [phone]   Fax:  [phone]  Physical Therapy Treatment  Patient Details  Name: Sarah IGLEHART MRN: [id number] Date of Birth: 1961-04-05 Referring Provider (PT): Esmond Plants MD   Encounter Date: 05/16/2021   PT End of Session - 05/16/21 1350     Visit Number 7    Number of Visits 12    Date for PT Re-Evaluation 05/24/21    PT Start Time 1347    PT Stop Time 1433    PT Time Calculation (min) 46 min    Equipment Utilized During Treatment Other (comment)   [place]   Activity Tolerance Patient tolerated treatment well    Behavior During Therapy [facility] for tasks assessed/performed             Past Medical History:  Diagnosis Date   Anemia    Diverticulosis    GERD (gastroesophageal reflux disease)    H/O adenoidectomy    Hemochromatosis 07/07/2011   Hyperlipidemia    Hypertension    Melanoma ([place])    right shoulder   Thyroid disease     Past Surgical History:  Procedure Laterality Date   BREAST BIOPSY  2004   left; benign   COLONOSCOPY WITH PROPOFOL N/A 06/29/2020   Procedure: COLONOSCOPY WITH PROPOFOL;  Surgeon: Rogene Houston, MD;  [facility];  Service: Endoscopy;  Laterality: N/A;   MELANOMA EXCISION  09/28/11   right shoulder   TONSILLECTOMY AND ADENOIDECTOMY      There were no vitals filed for this visit.   Subjective Assessment - 05/16/21 1349     Subjective COVID-19 screen performed prior to patient entering clinic. feeling only tightness of R knee today.    Pertinent History Long h/o right knee pain, HTN, asthma, thyroid problem.    How long can you sit comfortably? Limiting this and lying and elevating her LE's.    How long can you walk comfortably? Around home with walker.    Patient Stated Goals "Recover from surgery."    Currently in Pain? Yes    Pain Score 4     Pain Location Knee    Pain Orientation Right    Pain  Descriptors / Indicators Tightness    Pain Type Surgical pain    Pain Onset 1 to 4 weeks ago    Pain Frequency Intermittent    Aggravating Factors  Knee flexion                [facility] PT Assessment - 05/16/21 0001       Assessment   Medical Diagnosis Right knee arthroscopic surgery    Referring Provider (PT) Esmond Plants MD    Onset Date/Surgical Date 04/06/21    Next MD Visit 05/19/2021      Precautions   Precaution Comments PAIN-FREE RIGHT LE THER EX.      Restrictions   Weight Bearing Restrictions No      ROM / Strength   AROM / PROM / Strength AROM      AROM   Overall AROM  Within functional limits for tasks performed    Overall AROM Comments supine    AROM Assessment Site Knee    Right/Left Knee Right    Right Knee Extension 0    Right Knee Flexion 129                           [facility]  Adult PT Treatment/Exercise - 05/16/21 0001       Knee/Hip Exercises: Aerobic   Recumbent Bike L1, seat 2 x10 min      Knee/Hip Exercises: Standing   Forward Lunges Right;2 sets;10 reps;4 seconds;Limitations    Forward Lunges Limitations 14" box    Terminal Knee Extension Strengthening;Right;2 sets;10 reps;Theraband    Theraband Level (Terminal Knee Extension) Level 2 (Red)    Forward Step Up Right;2 sets;10 reps;Hand Hold: 2;Step Height: 6"    Step Down Right;15 reps;Hand Hold: 2;Step Height: 6"    Rocker Board 3 minutes      Knee/Hip Exercises: Seated   Long Arc Quad Strengthening;Right;2 sets;10 reps;Weights    Long Arc Quad Weight 3 lbs.      Modalities   Modalities Vasopneumatic      Vasopneumatic   Number Minutes Vasopneumatic  10 minutes    Vasopnuematic Location  Knee    Vasopneumatic Pressure Low    Vasopneumatic Temperature  34                          PT Long Term Goals - 05/16/21 1429       PT LONG TERM GOAL #1   Title Independent with a HEP.    Time 4    Period Weeks    Status On-going      PT LONG TERM GOAL  #2   Title Rgith knee active flexion to 125 degrees.    Time 4    Period Weeks    Status On-going      PT LONG TERM GOAL #3   Title Perform ADL's with pain not > 2-3/10.    Time 4    Period Weeks    Status Achieved      PT LONG TERM GOAL #4   Title Perform a reciprocating stair gait with one railing.    Time 4    Period Weeks    Status On-going      PT LONG TERM GOAL #5   Title Walk a community without assistive device.    Time 4    Period Weeks    Status On-going                   Plan - 05/16/21 1439     Clinical Impression Statement Patient presented in clinic with reports of tightness of the R knee. Patient reports that she has tried the small set of stairs at home to go shower and dress independently. Patient has also tried go shopping at the grocery store with cart but didn't last long until she had to use motorized cart. Patient able to tolerate therex without complaint of any increased pain. AROM of R knee measured as 0-129 deg. Normal vasopneumatic response noted following removal of the modality. Patient using [facility] for ambulation.    Personal Factors and Comorbidities Comorbidity 1;Other    Comorbidities Long h/o right knee pain, HTN, asthma, thyroid problem.    Examination-Activity Limitations Other;Locomotion Level    Examination-Participation Restrictions Other    Stability/Clinical Decision Making Stable/Uncomplicated    Rehab Potential Excellent    PT Frequency 3x / week    PT Duration 4 weeks    PT Treatment/Interventions ADLs/Self Care Home Management;Cryotherapy;Electrical Stimulation;Gait training;Stair training;Functional mobility training;Therapeutic activities;Therapeutic exercise;Neuromuscular re-education;Manual techniques;Patient/family education;Passive range of motion    PT Next Visit Plan Nustep, pain-free right LE ther ex. (O and CKC)  .  PRE's.  Vasopneumatic.  Consulted and Agree with Plan of Care Patient             Patient will  benefit from skilled therapeutic intervention in order to improve the following deficits and impairments:  Abnormal gait, Decreased mobility, Decreased activity tolerance, Decreased range of motion, Increased edema, Difficulty walking, Pain  Visit Diagnosis: Chronic pain of right knee  Stiffness of right knee, not elsewhere classified  Localized edema     Problem List Patient Active Problem List   Diagnosis Date Noted   Diverticulitis of intestine with perforation and abscess 03/22/2020   Hypokalemia 03/22/2020   Diverticulitis of large intestine with perforation without bleeding    Morbid obesity ([place]) 09/26/2019   Allergy to alpha-gal 03/14/2019   Contact dermatitis 03/24/2013   Asthma 03/24/2013   Melanoma of upper arm, right. T1a., History of 09/07/2011   Hemochromatosis 07/07/2011   Hypothyroidism 02/17/2008   Hyperlipidemia 10/11/2007   ANEMIA-NOS 10/11/2007   Essential hypertension 10/11/2007   ALLERGIC RHINITIS 10/11/2007   BRONCHITIS, CHRONIC 10/11/2007   GERD 10/11/2007   ADENOIDECTOMY, HX OF 10/11/2007    Standley Brooking, PTA 05/16/2021, 2:46 PM  [facility] [address] [place], [place], [address] Phone: [phone]   Fax:  [phone]  Name: ARDEAN SIMONICH MRN: [id number] Date of Birth: [birth year]

## 2021-05-18 ENCOUNTER — Encounter: Payer: Self-pay | Admitting: Physical Therapy

## 2021-05-18 ENCOUNTER — Ambulatory Visit: Payer: BC Managed Care – PPO | Admitting: Physical Therapy

## 2021-05-18 ENCOUNTER — Other Ambulatory Visit: Payer: Self-pay

## 2021-05-18 DIAGNOSIS — R600 Localized edema: Secondary | ICD-10-CM | POA: Diagnosis not present

## 2021-05-18 DIAGNOSIS — G8929 Other chronic pain: Secondary | ICD-10-CM

## 2021-05-18 DIAGNOSIS — M25661 Stiffness of right knee, not elsewhere classified: Secondary | ICD-10-CM

## 2021-05-18 DIAGNOSIS — M25561 Pain in right knee: Secondary | ICD-10-CM | POA: Diagnosis not present

## 2021-05-18 NOTE — Therapy (Signed)
[facility] [place], [place], [address] Phone: [phone]   Fax:  [phone]  Physical Therapy Treatment  Patient Details  Name: Sarah Garrett MRN: [id number] Date of Birth: 03/25/1961 Referring Provider (PT): Esmond Plants MD   Encounter Date: 05/18/2021   PT End of Session - 05/18/21 1443     Visit Number 8    Number of Visits 12    Date for PT Re-Evaluation 05/24/21    PT Start Time 1025    PT Stop Time 1423    PT Time Calculation (min) 43 min    Equipment Utilized During Treatment Other (comment)   [place]   Activity Tolerance Patient tolerated treatment well    Behavior During Therapy [facility] for tasks assessed/performed             Past Medical History:  Diagnosis Date   Anemia    Diverticulosis    GERD (gastroesophageal reflux disease)    H/O adenoidectomy    Hemochromatosis 07/07/2011   Hyperlipidemia    Hypertension    Melanoma ([place])    right shoulder   Thyroid disease     Past Surgical History:  Procedure Laterality Date   BREAST BIOPSY  2004   left; benign   COLONOSCOPY WITH PROPOFOL N/A 06/29/2020   Procedure: COLONOSCOPY WITH PROPOFOL;  Surgeon: Rogene Houston, MD;  [facility];  Service: Endoscopy;  Laterality: N/A;   MELANOMA EXCISION  09/28/11   right shoulder   TONSILLECTOMY AND ADENOIDECTOMY      There were no vitals filed for this visit.   Subjective Assessment - 05/18/21 1441     Subjective COVID-19 screen performed prior to patient entering clinic. Reports no pain but is still doing stairs at home.    Pertinent History Long h/o right knee pain, HTN, asthma, thyroid problem.    How long can you sit comfortably? Limiting this and lying and elevating her LE's.    How long can you walk comfortably? Around home with walker.    Patient Stated Goals "Recover from surgery."    Currently in Pain? No/denies                [facility] PT Assessment - 05/18/21 0001        Assessment   Medical Diagnosis Right knee arthroscopic surgery    Referring Provider (PT) Esmond Plants MD    Onset Date/Surgical Date 04/06/21    Next MD Visit 05/19/2021      Precautions   Precaution Comments PAIN-FREE RIGHT LE THER EX.                           [facility] Adult PT Treatment/Exercise - 05/18/21 0001       Knee/Hip Exercises: Aerobic   Recumbent Bike L3, seat 2 x10 min      Knee/Hip Exercises: Standing   Heel Raises Both;2 sets;10 reps    Heel Raises Limitations toe ups 2x10    Forward Lunges Right;2 sets;10 reps;4 seconds;Limitations    Forward Lunges Limitations 14" box    Lateral Step Up Right;2 sets;10 reps;Hand Hold: 2;Step Height: 4"    Step Down Right;2 sets;10 reps;Hand Hold: 2;Step Height: 4"      Knee/Hip Exercises: Seated   Long Arc Quad Strengthening;Right;2 sets;10 reps;Weights    Long Arc Quad Weight 4 lbs.      Modalities   Modalities Vasopneumatic      Vasopneumatic   Number  Minutes Vasopneumatic  10 minutes    Vasopnuematic Location  Knee    Vasopneumatic Pressure Low    Vasopneumatic Temperature  34                          PT Long Term Goals - 05/16/21 1429       PT LONG TERM GOAL #1   Title Independent with a HEP.    Time 4    Period Weeks    Status On-going      PT LONG TERM GOAL #2   Title Rgith knee active flexion to 125 degrees.    Time 4    Period Weeks    Status On-going      PT LONG TERM GOAL #3   Title Perform ADL's with pain not > 2-3/10.    Time 4    Period Weeks    Status Achieved      PT LONG TERM GOAL #4   Title Perform a reciprocating stair gait with one railing.    Time 4    Period Weeks    Status On-going      PT LONG TERM GOAL #5   Title Walk a community without assistive device.    Time 4    Period Weeks    Status On-going                   Plan - 05/18/21 1520     Clinical Impression Statement Patient presented in clinic with no complaints.  Patient has MD appointment tomorrow with R knee ROM WNL. Patient able to tolerate all therex well although patient reports she is sore from resisted TKE and hip strengthening in last treatment. Patient no longer taking pain medication but not driving at this time. Normal vasopneumatic response noted following removal of the modality.    Personal Factors and Comorbidities Comorbidity 1;Other    Comorbidities Long h/o right knee pain, HTN, asthma, thyroid problem.    Examination-Activity Limitations Other;Locomotion Level    Examination-Participation Restrictions Other    Stability/Clinical Decision Making Stable/Uncomplicated    Rehab Potential Excellent    PT Frequency 3x / week    PT Duration 4 weeks    PT Treatment/Interventions ADLs/Self Care Home Management;Cryotherapy;Electrical Stimulation;Gait training;Stair training;Functional mobility training;Therapeutic activities;Therapeutic exercise;Neuromuscular re-education;Manual techniques;Patient/family education;Passive range of motion    PT Next Visit Plan Nustep, pain-free right LE ther ex. (O and CKC)  .  PRE's.  Vasopneumatic.    Consulted and Agree with Plan of Care Patient             Patient will benefit from skilled therapeutic intervention in order to improve the following deficits and impairments:  Abnormal gait, Decreased mobility, Decreased activity tolerance, Decreased range of motion, Increased edema, Difficulty walking, Pain  Visit Diagnosis: Chronic pain of right knee  Stiffness of right knee, not elsewhere classified  Localized edema     Problem List Patient Active Problem List   Diagnosis Date Noted   Diverticulitis of intestine with perforation and abscess 03/22/2020   Hypokalemia 03/22/2020   Diverticulitis of large intestine with perforation without bleeding    Morbid obesity ([place]) 09/26/2019   Allergy to alpha-gal 03/14/2019   Contact dermatitis 03/24/2013   Asthma 03/24/2013   Melanoma of upper arm,  right. T1a., History of 09/07/2011   Hemochromatosis 07/07/2011   Hypothyroidism 02/17/2008   Hyperlipidemia 10/11/2007   ANEMIA-NOS 10/11/2007   Essential hypertension 10/11/2007   ALLERGIC RHINITIS  10/11/2007   BRONCHITIS, CHRONIC 10/11/2007   GERD 10/11/2007   ADENOIDECTOMY, HX OF 10/11/2007    Standley Brooking, PTA 05/18/2021, 3:34 PM  [facility] [place], [place], [address] Phone: [phone]   Fax:  [phone]  Name: BEYLA LONEY MRN: [id number] Date of Birth: 1961-02-15

## 2021-05-23 ENCOUNTER — Other Ambulatory Visit: Payer: Self-pay

## 2021-05-23 ENCOUNTER — Ambulatory Visit: Payer: BC Managed Care – PPO | Admitting: *Deleted

## 2021-05-23 DIAGNOSIS — M25661 Stiffness of right knee, not elsewhere classified: Secondary | ICD-10-CM

## 2021-05-23 DIAGNOSIS — G8929 Other chronic pain: Secondary | ICD-10-CM | POA: Diagnosis not present

## 2021-05-23 DIAGNOSIS — M25561 Pain in right knee: Secondary | ICD-10-CM | POA: Diagnosis not present

## 2021-05-23 DIAGNOSIS — R600 Localized edema: Secondary | ICD-10-CM | POA: Diagnosis not present

## 2021-05-23 NOTE — Therapy (Signed)
[facility] [place], [place], [address] Phone: [phone]   Fax:  [phone]  Physical Therapy Treatment  Patient Details  Name: Sarah Garrett MRN: [id number] Date of Birth: [birth year] Referring Provider (PT): Esmond Plants MD   Encounter Date: 05/23/2021   PT End of Session - 05/23/21 1439     Visit Number 9    Number of Visits 12    Date for PT Re-Evaluation 05/24/21    PT Start Time 1430    PT Stop Time 1525    PT Time Calculation (min) 55 min             Past Medical History:  Diagnosis Date   Anemia    Diverticulosis    GERD (gastroesophageal reflux disease)    H/O adenoidectomy    Hemochromatosis 07/07/2011   Hyperlipidemia    Hypertension    Melanoma ([place])    right shoulder   Thyroid disease     Past Surgical History:  Procedure Laterality Date   BREAST BIOPSY  2004   left; benign   COLONOSCOPY WITH PROPOFOL N/A 06/29/2020   Procedure: COLONOSCOPY WITH PROPOFOL;  Surgeon: Rogene Houston, MD;  [facility];  Service: Endoscopy;  Laterality: N/A;   MELANOMA EXCISION  09/28/11   right shoulder   TONSILLECTOMY AND ADENOIDECTOMY      There were no vitals filed for this visit.   Subjective Assessment - 05/23/21 1434     Subjective COVID-19 screen performed prior to patient entering clinic. Reports 2/10 pain at home    Pertinent History Long h/o right knee pain, HTN, asthma, thyroid problem.    How long can you sit comfortably? Limiting this and lying and elevating her LE's.    How long can you walk comfortably? Around home with walker.    Patient Stated Goals "Recover from surgery."    Currently in Pain? Yes    Pain Score 2     Pain Location Knee    Pain Orientation Right    Pain Descriptors / Indicators Tightness    Pain Type Surgical pain    Pain Onset 1 to 4 weeks ago                               [facility] Adult PT Treatment/Exercise - 05/23/21 0001        Exercises   Exercises Knee/Hip      Knee/Hip Exercises: Aerobic   Recumbent Bike L3, seat 2 x15 min      Knee/Hip Exercises: Standing   Heel Raises Both;2 sets;10 reps    Heel Raises Limitations toe ups 2x10    Forward Lunges Right;2 sets;10 reps;4 seconds;Limitations    Forward Lunges Limitations 14" box    Forward Step Up Right;2 sets;10 reps;Hand Hold: 2;Step Height: 6"    Step Down Right;2 sets;10 reps;Hand Hold: 2;Step Height: 4"      Knee/Hip Exercises: Seated   Long Arc Quad Strengthening;Right;10 reps;Weights;3 sets    [employer] Weight 4 lbs.      Modalities   Modalities Vasopneumatic      Vasopneumatic   Number Minutes Vasopneumatic  10 minutes    Vasopnuematic Location  Knee    Vasopneumatic Pressure Low    Vasopneumatic Temperature  34                          PT  Long Term Goals - 05/16/21 1429       PT LONG TERM GOAL #1   Title Independent with a HEP.    Time 4    Period Weeks    Status On-going      PT LONG TERM GOAL #2   Title Rgith knee active flexion to 125 degrees.    Time 4    Period Weeks    Status On-going      PT LONG TERM GOAL #3   Title Perform ADL's with pain not > 2-3/10.    Time 4    Period Weeks    Status Achieved      PT LONG TERM GOAL #4   Title Perform a reciprocating stair gait with one railing.    Time 4    Period Weeks    Status On-going      PT LONG TERM GOAL #5   Title Walk a community without assistive device.    Time 4    Period Weeks    Status On-going                   Plan - 05/23/21 1440     Clinical Impression Statement Pt arrived today doing fairly well and MD was pleased with current status and wanted her to continue with PT. Pt did well with OKC/CKC exs with no increased pain and manly soreness. Pt still not driving at this time. Improved quad control noted with step ups/down. Normal vaso response    Personal Factors and Comorbidities Comorbidity 1;Other     Comorbidities Long h/o right knee pain, HTN, asthma, thyroid problem.    Examination-Participation Restrictions Other    Stability/Clinical Decision Making Stable/Uncomplicated    Rehab Potential Excellent    PT Frequency 3x / week    PT Duration 4 weeks    PT Treatment/Interventions ADLs/Self Care Home Management;Cryotherapy;Electrical Stimulation;Gait training;Stair training;Functional mobility training;Therapeutic activities;Therapeutic exercise;Neuromuscular re-education;Manual techniques;Patient/family education;Passive range of motion    PT Next Visit Plan Nustep, pain-free right LE ther ex. (O and CKC)  .  PRE's.  Vasopneumatic.    Consulted and Agree with Plan of Care Patient             Patient will benefit from skilled therapeutic intervention in order to improve the following deficits and impairments:  Abnormal gait, Decreased mobility, Decreased activity tolerance, Decreased range of motion, Increased edema, Difficulty walking, Pain  Visit Diagnosis: Chronic pain of right knee  Stiffness of right knee, not elsewhere classified  Localized edema     Problem List Patient Active Problem List   Diagnosis Date Noted   Diverticulitis of intestine with perforation and abscess 03/22/2020   Hypokalemia 03/22/2020   Diverticulitis of large intestine with perforation without bleeding    Morbid obesity ([place]) 09/26/2019   Allergy to alpha-gal 03/14/2019   Contact dermatitis 03/24/2013   Asthma 03/24/2013   Melanoma of upper arm, right. T1a., History of 09/07/2011   Hemochromatosis 07/07/2011   Hypothyroidism 02/17/2008   Hyperlipidemia 10/11/2007   ANEMIA-NOS 10/11/2007   Essential hypertension 10/11/2007   ALLERGIC RHINITIS 10/11/2007   BRONCHITIS, CHRONIC 10/11/2007   GERD 10/11/2007   ADENOIDECTOMY, HX OF 10/11/2007    Daley Mooradian,CHRIS, PTA 05/23/2021, 3:54 PM  [facility] [address] [place], [place], [address] Phone:  [phone]   Fax:  [phone]  Name: Sarah Garrett MRN: [id number] Date of Birth: [birth year]

## 2021-05-26 ENCOUNTER — Encounter: Payer: Self-pay | Admitting: Physical Therapy

## 2021-05-26 ENCOUNTER — Other Ambulatory Visit: Payer: Self-pay

## 2021-05-26 ENCOUNTER — Ambulatory Visit: Payer: BC Managed Care – PPO | Admitting: Physical Therapy

## 2021-05-26 DIAGNOSIS — M25661 Stiffness of right knee, not elsewhere classified: Secondary | ICD-10-CM | POA: Diagnosis not present

## 2021-05-26 DIAGNOSIS — M25561 Pain in right knee: Secondary | ICD-10-CM | POA: Diagnosis not present

## 2021-05-26 DIAGNOSIS — G8929 Other chronic pain: Secondary | ICD-10-CM | POA: Diagnosis not present

## 2021-05-26 DIAGNOSIS — R600 Localized edema: Secondary | ICD-10-CM | POA: Diagnosis not present

## 2021-05-26 NOTE — Therapy (Addendum)
[facility] [place], [place], [address] Phone: [phone]   Fax:  [phone]  Physical Therapy Treatment  Patient Details  Name: Sarah Garrett MRN: [id number] Date of Birth: October 20, 1960 Referring Provider (PT): Esmond Plants MD   Encounter Date: 05/26/2021   PT End of Session - 05/26/21 1437     Visit Number 10    Number of Visits 12    Date for PT Re-Evaluation 05/24/21    PT Start Time 0092    PT Stop Time 1520    PT Time Calculation (min) 49 min    Equipment Utilized During Treatment Other (comment)   [place]   Activity Tolerance Patient tolerated treatment well    Behavior During Therapy [facility] for tasks assessed/performed             Past Medical History:  Diagnosis Date   Anemia    Diverticulosis    GERD (gastroesophageal reflux disease)    H/O adenoidectomy    Hemochromatosis 07/07/2011   Hyperlipidemia    Hypertension    Melanoma ([place])    right shoulder   Thyroid disease     Past Surgical History:  Procedure Laterality Date   BREAST BIOPSY  2004   left; benign   COLONOSCOPY WITH PROPOFOL N/A 06/29/2020   Procedure: COLONOSCOPY WITH PROPOFOL;  Surgeon: Rogene Houston, MD;  [facility];  Service: Endoscopy;  Laterality: N/A;   MELANOMA EXCISION  09/28/11   right shoulder   TONSILLECTOMY AND ADENOIDECTOMY      There were no vitals filed for this visit.   Subjective Assessment - 05/26/21 1434     Subjective COVID-19 screen performed prior to patient entering clinic. Reports her knee has done good while running to MD appointments with her mother.  Has been trying to wean from the cane.    Pertinent History Long h/o right knee pain, HTN, asthma, thyroid problem.    How long can you sit comfortably? Limiting this and lying and elevating her LE's.    How long can you walk comfortably? Around home with walker.    Patient Stated Goals "Recover from surgery."    Currently in Pain? Yes     Pain Score 1     Pain Location Knee    Pain Orientation Right    Pain Descriptors / Indicators Tightness    Pain Type Surgical pain    Pain Onset 1 to 4 weeks ago    Pain Frequency Intermittent                [facility] PT Assessment - 05/26/21 0001       Assessment   Medical Diagnosis Right knee arthroscopic surgery    Referring Provider (PT) Esmond Plants MD    Onset Date/Surgical Date 04/06/21      Precautions   Precaution Comments PAIN-FREE RIGHT LE THER EX.                           [place] Adult PT Treatment/Exercise - 05/26/21 0001       Knee/Hip Exercises: Aerobic   Recumbent Bike L3 x10 min      Knee/Hip Exercises: Standing   Terminal Knee Extension Strengthening;Right;15 reps;Theraband    Theraband Level (Terminal Knee Extension) Level 3 (Green)    Hip Abduction Stengthening;Right;2 sets;10 reps;Knee straight;Limitations    Abduction Limitations red theraband    Hip Extension Stengthening;Right;2 sets;10 reps;Knee straight;Limitations    Extension Limitations  red theraband    Forward Step Up Right;2 sets;10 reps;Hand Hold: 2;Step Height: 6"    Step Down Right;2 sets;10 reps;Hand Hold: 2;Step Height: 6"    Rocker Board 3 minutes      Knee/Hip Exercises: Seated   Long Arc Quad Strengthening;Right;10 reps;Weights;3 sets    [employer] Weight 4 lbs.      Modalities   Modalities Vasopneumatic      Vasopneumatic   Number Minutes Vasopneumatic  10 minutes    Vasopnuematic Location  Knee    Vasopneumatic Pressure Low    Vasopneumatic Temperature  34                          PT Long Term Goals - 05/16/21 1429       PT LONG TERM GOAL #1   Title Independent with a HEP.    Time 4    Period Weeks    Status On-going      PT LONG TERM GOAL #2   Title Rgith knee active flexion to 125 degrees.    Time 4    Period Weeks    Status On-going      PT LONG TERM GOAL #3   Title Perform ADL's with pain not > 2-3/10.    Time 4     Period Weeks    Status Achieved      PT LONG TERM GOAL #4   Title Perform a reciprocating stair gait with one railing.    Time 4    Period Weeks    Status On-going      PT LONG TERM GOAL #5   Title Walk a community without assistive device.    Time 4    Period Weeks    Status On-going                   Plan - 05/26/21 1713     Clinical Impression Statement Patient presented in clinic with reports of minimal tightness of R knee. Patient progressed with resisted strengthening to provide better strength and stability for ambulation without AD. Patient able to ambulate without AD although slow and purposeful but observed with less quad control. Also minimal degree of knee flexion noted in stance phase. Normal vasopneumatic response noted following removal of the modality.    Personal Factors and Comorbidities Comorbidity 1;Other    Comorbidities Long h/o right knee pain, HTN, asthma, thyroid problem.    Examination-Activity Limitations Other;Locomotion Level    Examination-Participation Restrictions Other    Stability/Clinical Decision Making Stable/Uncomplicated    Rehab Potential Excellent    PT Frequency 3x / week    PT Duration 4 weeks    PT Treatment/Interventions ADLs/Self Care Home Management;Cryotherapy;Electrical Stimulation;Gait training;Stair training;Functional mobility training;Therapeutic activities;Therapeutic exercise;Neuromuscular re-education;Manual techniques;Patient/family education;Passive range of motion    PT Next Visit Plan Nustep, pain-free right LE ther ex. (O and CKC)  .  PRE's.  Vasopneumatic.    Consulted and Agree with Plan of Care Patient             Patient will benefit from skilled therapeutic intervention in order to improve the following deficits and impairments:  Abnormal gait, Decreased mobility, Decreased activity tolerance, Decreased range of motion, Increased edema, Difficulty walking, Pain  Visit Diagnosis: Chronic pain of  right knee  Stiffness of right knee, not elsewhere classified  Localized edema     Problem List Patient Active Problem List   Diagnosis Date Noted  Diverticulitis of intestine with perforation and abscess 03/22/2020   Hypokalemia 03/22/2020   Diverticulitis of large intestine with perforation without bleeding    Morbid obesity ([place]) 09/26/2019   Allergy to alpha-gal 03/14/2019   Contact dermatitis 03/24/2013   Asthma 03/24/2013   Melanoma of upper arm, right. T1a., History of 09/07/2011   Hemochromatosis 07/07/2011   Hypothyroidism 02/17/2008   Hyperlipidemia 10/11/2007   ANEMIA-NOS 10/11/2007   Essential hypertension 10/11/2007   ALLERGIC RHINITIS 10/11/2007   BRONCHITIS, CHRONIC 10/11/2007   GERD 10/11/2007   ADENOIDECTOMY, HX OF 10/11/2007    Standley Brooking, PTA 05/26/2021, 5:16 PM  [facility] [address] [place], [place], [address] Phone: [phone]   Fax:  [phone]  Name: KYNDRA CONDRON MRN: [id number] Date of Birth: 22-Feb-1961   Progress Note Reporting Period 04/26/21 to 05/26/21.  See note below for Objective Data and Assessment of Progress/Goals. Good progress.  LTG #3 met at this time.    Mali Applegate MPT

## 2021-05-31 ENCOUNTER — Encounter: Payer: Self-pay | Admitting: Physical Therapy

## 2021-05-31 ENCOUNTER — Other Ambulatory Visit: Payer: Self-pay

## 2021-05-31 ENCOUNTER — Ambulatory Visit: Payer: BC Managed Care – PPO | Attending: Orthopedic Surgery | Admitting: Physical Therapy

## 2021-05-31 DIAGNOSIS — R600 Localized edema: Secondary | ICD-10-CM | POA: Diagnosis not present

## 2021-05-31 DIAGNOSIS — M25561 Pain in right knee: Secondary | ICD-10-CM | POA: Diagnosis not present

## 2021-05-31 DIAGNOSIS — G8929 Other chronic pain: Secondary | ICD-10-CM | POA: Diagnosis not present

## 2021-05-31 DIAGNOSIS — M25661 Stiffness of right knee, not elsewhere classified: Secondary | ICD-10-CM | POA: Diagnosis not present

## 2021-05-31 NOTE — Therapy (Addendum)
[facility] [place], [place], [address] Phone: [phone]   Fax:  [phone]  Physical Therapy Treatment  Patient Details  Name: Sarah Garrett MRN: [id number] Date of Birth: [birth year] Referring Provider (PT): Esmond Plants MD   Encounter Date: 05/31/2021   PT End of Session - 05/31/21 1435     Visit Number 11    Number of Visits 12    Date for PT Re-Evaluation 05/24/21    PT Start Time 0258    PT Stop Time 1521    PT Time Calculation (min) 50 min    Equipment Utilized During Treatment Other (comment)   [place]   Activity Tolerance Patient tolerated treatment well    Behavior During Therapy [facility] for tasks assessed/performed             Past Medical History:  Diagnosis Date   Anemia    Diverticulosis    GERD (gastroesophageal reflux disease)    H/O adenoidectomy    Hemochromatosis 07/07/2011   Hyperlipidemia    Hypertension    Melanoma ([place])    right shoulder   Thyroid disease     Past Surgical History:  Procedure Laterality Date   BREAST BIOPSY  2004   left; benign   COLONOSCOPY WITH PROPOFOL N/A 06/29/2020   Procedure: COLONOSCOPY WITH PROPOFOL;  Surgeon: Rogene Houston, MD;  [facility];  Service: Endoscopy;  Laterality: N/A;   MELANOMA EXCISION  09/28/11   right shoulder   TONSILLECTOMY AND ADENOIDECTOMY      There were no vitals filed for this visit.   Subjective Assessment - 05/31/21 1432     Subjective COVID-19 screen performed prior to patient entering clinic. Reports she has been released by MD as he was pleased. Reports she is only using SPC if she is outside or using stairs.    Pertinent History Long h/o right knee pain, HTN, asthma, thyroid problem.    How long can you sit comfortably? Limiting this and lying and elevating her LE's.    How long can you walk comfortably? Around home with walker.    Patient Stated Goals "Recover from surgery."    Currently in Pain? Yes     Pain Score 1     Pain Location Knee    Pain Orientation Right    Pain Descriptors / Indicators Discomfort    Pain Type Surgical pain    Pain Onset 1 to 4 weeks ago    Pain Frequency Intermittent                [facility] PT Assessment - 05/31/21 0001       Assessment   Medical Diagnosis Right knee arthroscopic surgery    Referring Provider (PT) Esmond Plants MD    Onset Date/Surgical Date 04/06/21    Next MD Visit None      Precautions   Precaution Comments PAIN-FREE RIGHT LE THER EX.      ROM / Strength   AROM / PROM / Strength AROM      AROM   Overall AROM  Within functional limits for tasks performed    AROM Assessment Site Knee    Right/Left Knee Right    Right Knee Flexion 131                           [facility] Adult PT Treatment/Exercise - 05/31/21 0001       Ambulation/Gait   Stairs Yes  Stairs Assistance 6: Modified independent (Device/Increase time)    Stair Management Technique One rail Right;Alternating pattern;Step to pattern;Forwards;With cane    Number of Stairs 4    Height of Stairs 6.5      Knee/Hip Exercises: Aerobic   Recumbent Bike L3 x10 min      Knee/Hip Exercises: Machines for Strengthening   Cybex Leg Press 1 pl ,seat 6 x20 reps      Knee/Hip Exercises: Standing   Hip Abduction Stengthening;Right;2 sets;10 reps;Knee straight;Limitations    Abduction Limitations red theraband    Hip Extension Stengthening;Right;2 sets;10 reps;Knee straight;Limitations    Extension Limitations red theraband    Walking with Sports Cord RLE backwards blue XTS x15 reps      Knee/Hip Exercises: Seated   Long Arc Quad Strengthening;Right;10 reps;Weights;3 sets    [employer] Weight 4 lbs.    Sit to Sand 15 reps;with UE support;without UE support   4" step under LLE     Modalities   Modalities Vasopneumatic      Vasopneumatic   Number Minutes Vasopneumatic  10 minutes    Vasopnuematic Location  Knee    Vasopneumatic Pressure Low     Vasopneumatic Temperature  34                          PT Long Term Goals - 05/31/21 1537       PT LONG TERM GOAL #1   Title Independent with a HEP.    Time 4    Period Weeks    Status On-going      PT LONG TERM GOAL #2   Title Rgith knee active flexion to 125 degrees.    Time 4    Period Weeks    Status Achieved      PT LONG TERM GOAL #3   Title Perform ADL's with pain not > 2-3/10.    Time 4    Period Weeks    Status Achieved      PT LONG TERM GOAL #4   Title Perform a reciprocating stair gait with one railing.    Time 4    Period Weeks    Status Partially Met      PT LONG TERM GOAL #5   Title Walk a community without assistive device.    Time 4    Period Weeks    Status Partially Met   Still takes AD but can walk without it                  Plan - 05/31/21 1539     Clinical Impression Statement Patient presented in clinic with reports of minimal discomfort of R knee. Patient becoming more independent with ambulation without AD within certain enviroments. Patient is not comfortable with driving at this time. Patient able to tolerate all therex with only reports of pain with resisted hip strengthening. AROM of R knee measured as 131 deg. Normal vasopneumatic response noted following removal of the modality.    Personal Factors and Comorbidities Comorbidity 1;Other    Comorbidities Long h/o right knee pain, HTN, asthma, thyroid problem.    Examination-Activity Limitations Other;Locomotion Level    Examination-Participation Restrictions Other    Stability/Clinical Decision Making Stable/Uncomplicated    Rehab Potential Excellent    PT Frequency 3x / week    PT Duration 4 weeks    PT Treatment/Interventions ADLs/Self Care Home Management;Cryotherapy;Electrical Stimulation;Gait training;Stair training;Functional mobility training;Therapeutic activities;Therapeutic exercise;Neuromuscular re-education;Manual techniques;Patient/family  education;Passive range of motion    PT Next Visit Plan Nustep, pain-free right LE ther ex. (O and CKC)  .  PRE's.  Vasopneumatic.    Consulted and Agree with Plan of Care Patient             Patient will benefit from skilled therapeutic intervention in order to improve the following deficits and impairments:  Abnormal gait, Decreased mobility, Decreased activity tolerance, Decreased range of motion, Increased edema, Difficulty walking, Pain  Visit Diagnosis: Chronic pain of right knee  Stiffness of right knee, not elsewhere classified  Localized edema     Problem List Patient Active Problem List   Diagnosis Date Noted   Diverticulitis of intestine with perforation and abscess 03/22/2020   Hypokalemia 03/22/2020   Diverticulitis of large intestine with perforation without bleeding    Morbid obesity ([place]) 09/26/2019   Allergy to alpha-gal 03/14/2019   Contact dermatitis 03/24/2013   Asthma 03/24/2013   Melanoma of upper arm, right. T1a., History of 09/07/2011   Hemochromatosis 07/07/2011   Hypothyroidism 02/17/2008   Hyperlipidemia 10/11/2007   ANEMIA-NOS 10/11/2007   Essential hypertension 10/11/2007   ALLERGIC RHINITIS 10/11/2007   BRONCHITIS, CHRONIC 10/11/2007   GERD 10/11/2007   ADENOIDECTOMY, HX OF 10/11/2007    Standley Brooking, PTA 05/31/2021, 4:11 PM  [facility] [address] [place], [place], [address] Phone: [phone]   Fax:  [phone]  Name: Sarah Garrett MRN: [id number] Date of Birth: 06-05-1961

## 2021-05-31 NOTE — Addendum Note (Signed)
Addended by: Jesiah Grismer, [place] W on: 05/31/2021 04:47 PM   Modules accepted: Orders

## 2021-06-02 ENCOUNTER — Ambulatory Visit: Payer: BC Managed Care – PPO

## 2021-06-02 ENCOUNTER — Other Ambulatory Visit: Payer: Self-pay

## 2021-06-02 DIAGNOSIS — M25561 Pain in right knee: Secondary | ICD-10-CM | POA: Diagnosis not present

## 2021-06-02 DIAGNOSIS — R600 Localized edema: Secondary | ICD-10-CM | POA: Diagnosis not present

## 2021-06-02 DIAGNOSIS — G8929 Other chronic pain: Secondary | ICD-10-CM | POA: Diagnosis not present

## 2021-06-02 DIAGNOSIS — M25661 Stiffness of right knee, not elsewhere classified: Secondary | ICD-10-CM

## 2021-06-02 NOTE — Therapy (Signed)
[facility] [place], [place], [address] Phone: [phone]   Fax:  [phone]  Physical Therapy Treatment  Patient Details  Name: Sarah Garrett MRN: [id number] Date of Birth: 1961-06-10 Referring Provider (PT): Esmond Plants MD   Encounter Date: 06/02/2021   PT End of Session - 06/02/21 1440     Visit Number 12    Number of Visits 20    Date for PT Re-Evaluation 06/28/21    PT Start Time 1430    PT Stop Time 1534    PT Time Calculation (min) 64 min    Equipment Utilized During Treatment Other (comment)   [place]   Activity Tolerance Patient tolerated treatment well    Behavior During Therapy [facility] for tasks assessed/performed             Past Medical History:  Diagnosis Date   Anemia    Diverticulosis    GERD (gastroesophageal reflux disease)    H/O adenoidectomy    Hemochromatosis 07/07/2011   Hyperlipidemia    Hypertension    Melanoma ([place])    right shoulder   Thyroid disease     Past Surgical History:  Procedure Laterality Date   BREAST BIOPSY  2004   left; benign   COLONOSCOPY WITH PROPOFOL N/A 06/29/2020   Procedure: COLONOSCOPY WITH PROPOFOL;  Surgeon: Rogene Houston, MD;  [facility];  Service: Endoscopy;  Laterality: N/A;   MELANOMA EXCISION  09/28/11   right shoulder   TONSILLECTOMY AND ADENOIDECTOMY      There were no vitals filed for this visit.   Subjective Assessment - 06/02/21 1436     Subjective COVID-19 screen performed prior to patient entering clinic. Pt reports 1/10 right knee pain upon arriving for today's treatment session.  Pt is wondering if she is doing too much or too little.  Pt arrives to treatment without SPC, only uses it with steps or on uneven terrain.    Pertinent History Long h/o right knee pain, HTN, asthma, thyroid problem.    How long can you sit comfortably? Limiting this and lying and elevating her LE's.    How long can you walk comfortably? Around  home with walker.    Patient Stated Goals "Recover from surgery."    Currently in Pain? Yes    Pain Score 1     Pain Location Knee    Pain Orientation Right    Pain Onset 1 to 4 weeks ago                               [facility] Adult PT Treatment/Exercise - 06/02/21 0001       Knee/Hip Exercises: Aerobic   Recumbent Bike Lvl 3 x 15 min   seat 1     Knee/Hip Exercises: Machines for Strengthening   Cybex Knee Extension 20# 10 reps    Cybex Knee Flexion 30# 20  reps    Cybex Leg Press 1 plate, seat 6, x 20 reps      Knee/Hip Exercises: Standing   Lateral Step Up Right;20 reps;Hand Hold: 2;Step Height: 6"    Forward Step Up Right;Hand Hold: 2;Step Height: 6";20 reps   cues to not push off with left foot   Step Down Right;10 reps;Step Height: 4";Hand Hold: 1   Cues for eccentric     Vasopneumatic   Number Minutes Vasopneumatic  15 minutes    Vasopnuematic Location  Knee  Vasopneumatic Pressure Low    Vasopneumatic Temperature  34                          PT Long Term Goals - 05/31/21 1537       PT LONG TERM GOAL #1   Title Independent with a HEP.    Time 4    Period Weeks    Status On-going      PT LONG TERM GOAL #2   Title Rgith knee active flexion to 125 degrees.    Time 4    Period Weeks    Status Achieved      PT LONG TERM GOAL #3   Title Perform ADL's with pain not > 2-3/10.    Time 4    Period Weeks    Status Achieved      PT LONG TERM GOAL #4   Title Perform a reciprocating stair gait with one railing.    Time 4    Period Weeks    Status Partially Met      PT LONG TERM GOAL #5   Title Walk a community without assistive device.    Time 4    Period Weeks    Status Partially Met   Still takes AD but can walk without it                  Plan - 06/02/21 1440     Clinical Impression Statement Pt arrives at today's treatment session reporting 1/10 right knee pain.  Pt did not bring her [facility] with her to her  treatment session today.  Pt states that she only uses it on stairs and even surfaces.  Pt able to tolerate the addtion of Cybex knee flexion and extension machine without pain, but does endorse being challenged.  Pt able to tolerate increased step height with forward and lateral step ups with minimal increase in pain of 3/10.  Pt challenged by step downs with 4 inch step height.  Pt reported 0/10 right knee pain at completion of treatment session.    Personal Factors and Comorbidities Comorbidity 1;Other    Comorbidities Long h/o right knee pain, HTN, asthma, thyroid problem.    Examination-Activity Limitations Other;Locomotion Level    Examination-Participation Restrictions Other    Stability/Clinical Decision Making Stable/Uncomplicated    Rehab Potential Excellent    PT Frequency 3x / week    PT Duration 4 weeks    PT Treatment/Interventions ADLs/Self Care Home Management;Cryotherapy;Electrical Stimulation;Gait training;Stair training;Functional mobility training;Therapeutic activities;Therapeutic exercise;Neuromuscular re-education;Manual techniques;Patient/family education;Passive range of motion    PT Next Visit Plan Nustep, pain-free right LE ther ex. (O and CKC)  .  PRE's.  Vasopneumatic.    Consulted and Agree with Plan of Care Patient             Patient will benefit from skilled therapeutic intervention in order to improve the following deficits and impairments:  Abnormal gait, Decreased mobility, Decreased activity tolerance, Decreased range of motion, Increased edema, Difficulty walking, Pain  Visit Diagnosis: Chronic pain of right knee  Stiffness of right knee, not elsewhere classified     Problem List Patient Active Problem List   Diagnosis Date Noted   Diverticulitis of intestine with perforation and abscess 03/22/2020   Hypokalemia 03/22/2020   Diverticulitis of large intestine with perforation without bleeding    Morbid obesity ([place]) 09/26/2019   Allergy to  alpha-gal 03/14/2019   Contact dermatitis 03/24/2013   Asthma 03/24/2013  Melanoma of upper arm, right. T1a., History of 09/07/2011   Hemochromatosis 07/07/2011   Hypothyroidism 02/17/2008   Hyperlipidemia 10/11/2007   ANEMIA-NOS 10/11/2007   Essential hypertension 10/11/2007   ALLERGIC RHINITIS 10/11/2007   BRONCHITIS, CHRONIC 10/11/2007   GERD 10/11/2007   ADENOIDECTOMY, HX OF 10/11/2007    Kathrynn Ducking, PTA 06/02/2021, 3:36 PM  [facility] [address] [place], [place], [address] Phone: [phone]   Fax:  [phone]  Name: IDALY VERRET MRN: [id number] Date of Birth: Mar 26, 1961

## 2021-06-06 ENCOUNTER — Ambulatory Visit: Payer: BC Managed Care – PPO

## 2021-06-06 DIAGNOSIS — M25661 Stiffness of right knee, not elsewhere classified: Secondary | ICD-10-CM | POA: Diagnosis not present

## 2021-06-06 DIAGNOSIS — G8929 Other chronic pain: Secondary | ICD-10-CM | POA: Diagnosis not present

## 2021-06-06 DIAGNOSIS — M25561 Pain in right knee: Secondary | ICD-10-CM | POA: Diagnosis not present

## 2021-06-06 DIAGNOSIS — R600 Localized edema: Secondary | ICD-10-CM

## 2021-06-06 NOTE — Therapy (Signed)
[facility] [place], [place], [address] Phone: [phone]   Fax:  [phone]  Physical Therapy Treatment  Patient Details  Name: Sarah Garrett MRN: [id number] Date of Birth: 1960-12-31 Referring Provider (PT): Esmond Plants MD   Encounter Date: 06/06/2021   PT End of Session - 06/06/21 1521     Visit Number 13    Number of Visits 20    Date for PT Re-Evaluation 06/28/21    PT Start Time 9983    PT Stop Time 1606    PT Time Calculation (min) 51 min    Equipment Utilized During Treatment Other (comment)   [place]   Activity Tolerance Patient tolerated treatment well    Behavior During Therapy [facility] for tasks assessed/performed             Past Medical History:  Diagnosis Date   Anemia    Diverticulosis    GERD (gastroesophageal reflux disease)    H/O adenoidectomy    Hemochromatosis 07/07/2011   Hyperlipidemia    Hypertension    Melanoma ([place])    right shoulder   Thyroid disease     Past Surgical History:  Procedure Laterality Date   BREAST BIOPSY  2004   left; benign   COLONOSCOPY WITH PROPOFOL N/A 06/29/2020   Procedure: COLONOSCOPY WITH PROPOFOL;  Surgeon: Rogene Houston, MD;  [facility];  Service: Endoscopy;  Laterality: N/A;   MELANOMA EXCISION  09/28/11   right shoulder   TONSILLECTOMY AND ADENOIDECTOMY      There were no vitals filed for this visit.   Subjective Assessment - 06/06/21 1519     Subjective COVID-19 screen performed prior to patient entering clinic. Patient reports that her knee is hurting a little bit more today from the weights at her last appointment and walking up a hill over the weekend.    Pertinent History Long h/o right knee pain, HTN, asthma, thyroid problem.    How long can you sit comfortably? Limiting this and lying and elevating her LE's.    How long can you walk comfortably? Around home with walker.    Patient Stated Goals "Recover from surgery."     Currently in Pain? Yes    Pain Score 2     Pain Location Knee    Pain Orientation Right    Pain Descriptors / Indicators Sore    Pain Type Surgical pain    Pain Onset 1 to 4 weeks ago                               [facility] Adult PT Treatment/Exercise - 06/06/21 0001       Knee/Hip Exercises: Aerobic   Recumbent Bike Lvl 3 x 15 min      Knee/Hip Exercises: Machines for Strengthening   Cybex Knee Extension 20# 15 reps    Cybex Knee Flexion 30# 15  reps      Knee/Hip Exercises: Standing   Rocker Board 3 minutes                 Balance Exercises - 06/06/21 0001       Balance Exercises: Standing   Tandem Stance Foam/compliant surface;Intermittent upper extremity support;Eyes open;4 reps;30 secs    Marching Upper extremity assist 2;Foam/compliant surface;Static   on BOSU (ball up); 2 minutes  PT Long Term Goals - 05/31/21 1537       PT LONG TERM GOAL #1   Title Independent with a HEP.    Time 4    Period Weeks    Status On-going      PT LONG TERM GOAL #2   Title Rgith knee active flexion to 125 degrees.    Time 4    Period Weeks    Status Achieved      PT LONG TERM GOAL #3   Title Perform ADL's with pain not > 2-3/10.    Time 4    Period Weeks    Status Achieved      PT LONG TERM GOAL #4   Title Perform a reciprocating stair gait with one railing.    Time 4    Period Weeks    Status Partially Met      PT LONG TERM GOAL #5   Title Walk a community without assistive device.    Time 4    Period Weeks    Status Partially Met   Still takes AD but can walk without it                  Plan - 06/06/21 1521     Clinical Impression Statement Patient presented to treatment with a mild increase in right knee soreness. However, this did not inhibit her ability to complete any of today's interventions. She was introduced to multiple new balance interventions for improved lower extremity stability on  unstable terraiin. She required constant upper extremity support with dynamic balance and intermittent support with static balance. She reported feeling "pretty good" upon the conclusion of treatment. She continues to require skilled physical therapy to safely return to her prior level of function.    Personal Factors and Comorbidities Comorbidity 1;Other    Comorbidities Long h/o right knee pain, HTN, asthma, thyroid problem.    Examination-Activity Limitations Other;Locomotion Level    Examination-Participation Restrictions Other    Stability/Clinical Decision Making Stable/Uncomplicated    Rehab Potential Excellent    PT Frequency 3x / week    PT Duration 4 weeks    PT Treatment/Interventions ADLs/Self Care Home Management;Cryotherapy;Electrical Stimulation;Gait training;Stair training;Functional mobility training;Therapeutic activities;Therapeutic exercise;Neuromuscular re-education;Manual techniques;Patient/family education;Passive range of motion    PT Next Visit Plan Nustep, pain-free right LE ther ex. (O and CKC)  .  PRE's.  Vasopneumatic.    Consulted and Agree with Plan of Care Patient             Patient will benefit from skilled therapeutic intervention in order to improve the following deficits and impairments:  Abnormal gait, Decreased mobility, Decreased activity tolerance, Decreased range of motion, Increased edema, Difficulty walking, Pain  Visit Diagnosis: Chronic pain of right knee  Stiffness of right knee, not elsewhere classified  Localized edema     Problem List Patient Active Problem List   Diagnosis Date Noted   Diverticulitis of intestine with perforation and abscess 03/22/2020   Hypokalemia 03/22/2020   Diverticulitis of large intestine with perforation without bleeding    Morbid obesity ([place]) 09/26/2019   Allergy to alpha-gal 03/14/2019   Contact dermatitis 03/24/2013   Asthma 03/24/2013   Melanoma of upper arm, right. T1a., History of 09/07/2011    Hemochromatosis 07/07/2011   Hypothyroidism 02/17/2008   Hyperlipidemia 10/11/2007   ANEMIA-NOS 10/11/2007   Essential hypertension 10/11/2007   ALLERGIC RHINITIS 10/11/2007   BRONCHITIS, CHRONIC 10/11/2007   GERD 10/11/2007   ADENOIDECTOMY, HX OF 10/11/2007  Darlin Coco, PT 06/06/2021, 4:08 PM  [facility] [place], [place], [address] Phone: [phone]   Fax:  [phone]  Name: Sarah Garrett MRN: [id number] Date of Birth: 01/21/1961

## 2021-06-07 ENCOUNTER — Ambulatory Visit: Payer: BC Managed Care – PPO

## 2021-06-08 ENCOUNTER — Telehealth: Payer: Self-pay | Admitting: Family Medicine

## 2021-06-08 ENCOUNTER — Other Ambulatory Visit: Payer: Self-pay | Admitting: Family Medicine

## 2021-06-08 DIAGNOSIS — E039 Hypothyroidism, unspecified: Secondary | ICD-10-CM

## 2021-06-08 DIAGNOSIS — E7800 Pure hypercholesterolemia, unspecified: Secondary | ICD-10-CM

## 2021-06-08 DIAGNOSIS — I10 Essential (primary) hypertension: Secondary | ICD-10-CM

## 2021-06-08 NOTE — Telephone Encounter (Signed)
Labs ordered.

## 2021-06-09 ENCOUNTER — Other Ambulatory Visit: Payer: Self-pay

## 2021-06-09 ENCOUNTER — Encounter: Payer: Self-pay | Admitting: Physical Therapy

## 2021-06-09 ENCOUNTER — Ambulatory Visit: Payer: BC Managed Care – PPO | Admitting: Physical Therapy

## 2021-06-09 DIAGNOSIS — M25661 Stiffness of right knee, not elsewhere classified: Secondary | ICD-10-CM

## 2021-06-09 DIAGNOSIS — M25561 Pain in right knee: Secondary | ICD-10-CM | POA: Diagnosis not present

## 2021-06-09 DIAGNOSIS — R600 Localized edema: Secondary | ICD-10-CM

## 2021-06-09 DIAGNOSIS — G8929 Other chronic pain: Secondary | ICD-10-CM

## 2021-06-09 NOTE — Therapy (Signed)
[facility] [place], [place], [address] Phone: [phone]   Fax:  [phone]  Physical Therapy Treatment  Patient Details  Name: Sarah Garrett MRN: [id number] Date of Birth: [birth year] Referring Provider (PT): Esmond Plants MD   Encounter Date: 06/09/2021   PT End of Session - 06/09/21 1440     Visit Number 14    Number of Visits 20    Date for PT Re-Evaluation 06/28/21    PT Start Time 4320    PT Stop Time 0379    PT Time Calculation (min) 44 min    Activity Tolerance Patient tolerated treatment well    Behavior During Therapy [facility] for tasks assessed/performed             Past Medical History:  Diagnosis Date   Anemia    Diverticulosis    GERD (gastroesophageal reflux disease)    H/O adenoidectomy    Hemochromatosis 07/07/2011   Hyperlipidemia    Hypertension    Melanoma ([place])    right shoulder   Thyroid disease     Past Surgical History:  Procedure Laterality Date   BREAST BIOPSY  2004   left; benign   COLONOSCOPY WITH PROPOFOL N/A 06/29/2020   Procedure: COLONOSCOPY WITH PROPOFOL;  Surgeon: Rogene Houston, MD;  [facility];  Service: Endoscopy;  Laterality: N/A;   MELANOMA EXCISION  09/28/11   right shoulder   TONSILLECTOMY AND ADENOIDECTOMY      There were no vitals filed for this visit.   Subjective Assessment - 06/09/21 1432     Subjective COVID-19 screen performed prior to patient entering clinic. Minimal pain today. Reports tightness and intermittant pain with ROM.    Pertinent History Long h/o right knee pain, HTN, asthma, thyroid problem.    How long can you walk comfortably? Walking all day but not comfortable    Patient Stated Goals "Recover from surgery."    Currently in Pain? Yes    Pain Score 1     Pain Location Knee    Pain Orientation Right    Pain Descriptors / Indicators Tightness;Discomfort    Pain Type Surgical pain    Pain Onset More than a month ago     Pain Frequency Intermittent                [facility] PT Assessment - 06/09/21 0001       Assessment   Medical Diagnosis Right knee arthroscopic surgery    Referring Provider (PT) Esmond Plants MD    Onset Date/Surgical Date 04/06/21    Next MD Visit None      Precautions   Precaution Comments PAIN-FREE RIGHT LE THER EX.                           [facility] Adult PT Treatment/Exercise - 06/09/21 0001       Knee/Hip Exercises: Aerobic   Recumbent Bike Lvl 3 x 15 min      Knee/Hip Exercises: Machines for Strengthening   Cybex Knee Extension 20# x5 reps    Cybex Knee Flexion 30# 2x15  reps      Knee/Hip Exercises: Standing   Terminal Knee Extension Strengthening;Right;15 reps;Limitations    Terminal Knee Extension Limitations Blue XTS    Walking with Sports Cord Backwards step blue XTS x10 reps      Knee/Hip Exercises: Seated   Sit to Sand 20 reps;without UE support   x10 reps  normal; x10 reps RLE SL with L toe touch     Modalities   Modalities Vasopneumatic      Vasopneumatic   Number Minutes Vasopneumatic  10 minutes    Vasopnuematic Location  Knee    Vasopneumatic Pressure Low    Vasopneumatic Temperature  34                          PT Long Term Goals - 05/31/21 1537       PT LONG TERM GOAL #1   Title Independent with a HEP.    Time 4    Period Weeks    Status On-going      PT LONG TERM GOAL #2   Title Rgith knee active flexion to 125 degrees.    Time 4    Period Weeks    Status Achieved      PT LONG TERM GOAL #3   Title Perform ADL's with pain not > 2-3/10.    Time 4    Period Weeks    Status Achieved      PT LONG TERM GOAL #4   Title Perform a reciprocating stair gait with one railing.    Time 4    Period Weeks    Status Partially Met      PT LONG TERM GOAL #5   Title Walk a community without assistive device.    Time 4    Period Weeks    Status Partially Met   Still takes AD but can walk without it                   Plan - 06/09/21 1524     Clinical Impression Statement Patient presented in clinic with minimal L knee pain. Patient able to tolerate therex but limited with machine knee extension as she had a shocking pain. Exercise was then terminated. No other complaints of pain during treatment session. Moderate edema notable around the R knee but patient reports compliance with icing after work and sitting. Normal vasopneumatic response noted following removal of the modality. Moderate antalgic deviation noted during gait with limited stance on RLE.    Personal Factors and Comorbidities Comorbidity 1;Other    Comorbidities Long h/o right knee pain, HTN, asthma, thyroid problem.    Examination-Activity Limitations Other;Locomotion Level    Examination-Participation Restrictions Other    Stability/Clinical Decision Making Stable/Uncomplicated    Rehab Potential Excellent    PT Frequency 3x / week    PT Duration 4 weeks    PT Treatment/Interventions ADLs/Self Care Home Management;Cryotherapy;Electrical Stimulation;Gait training;Stair training;Functional mobility training;Therapeutic activities;Therapeutic exercise;Neuromuscular re-education;Manual techniques;Patient/family education;Passive range of motion    PT Next Visit Plan Nustep, pain-free right LE ther ex. (O and CKC)  .  PRE's.  Vasopneumatic.    Consulted and Agree with Plan of Care Patient             Patient will benefit from skilled therapeutic intervention in order to improve the following deficits and impairments:  Abnormal gait, Decreased mobility, Decreased activity tolerance, Decreased range of motion, Increased edema, Difficulty walking, Pain  Visit Diagnosis: Chronic pain of right knee  Stiffness of right knee, not elsewhere classified  Localized edema     Problem List Patient Active Problem List   Diagnosis Date Noted   Diverticulitis of intestine with perforation and abscess 03/22/2020   Hypokalemia  03/22/2020   Diverticulitis of large intestine with perforation without bleeding    Morbid obesity (  [place]) 09/26/2019   Allergy to alpha-gal 03/14/2019   Contact dermatitis 03/24/2013   Asthma 03/24/2013   Melanoma of upper arm, right. T1a., History of 09/07/2011   Hemochromatosis 07/07/2011   Hypothyroidism 02/17/2008   Hyperlipidemia 10/11/2007   ANEMIA-NOS 10/11/2007   Essential hypertension 10/11/2007   ALLERGIC RHINITIS 10/11/2007   BRONCHITIS, CHRONIC 10/11/2007   GERD 10/11/2007   ADENOIDECTOMY, HX OF 10/11/2007    Standley Brooking, PTA 06/09/2021, 3:36 PM  [facility] [address] [place], [place], [address] Phone: [phone]   Fax:  [phone]  Name: Sarah Garrett MRN: [id number] Date of Birth: 1961-01-31

## 2021-06-10 ENCOUNTER — Other Ambulatory Visit: Payer: BC Managed Care – PPO

## 2021-06-10 ENCOUNTER — Other Ambulatory Visit: Payer: Self-pay | Admitting: Family Medicine

## 2021-06-10 DIAGNOSIS — E7800 Pure hypercholesterolemia, unspecified: Secondary | ICD-10-CM | POA: Diagnosis not present

## 2021-06-10 DIAGNOSIS — E039 Hypothyroidism, unspecified: Secondary | ICD-10-CM | POA: Diagnosis not present

## 2021-06-10 DIAGNOSIS — E785 Hyperlipidemia, unspecified: Secondary | ICD-10-CM | POA: Diagnosis not present

## 2021-06-10 DIAGNOSIS — I10 Essential (primary) hypertension: Secondary | ICD-10-CM | POA: Diagnosis not present

## 2021-06-10 LAB — CBC WITH DIFFERENTIAL/PLATELET
Basophils Absolute: 0.1 10*3/uL (ref 0.0–0.2)
Basos: 1 %
EOS (ABSOLUTE): 0.4 10*3/uL (ref 0.0–0.4)
Eos: 7 %
Hematocrit: 42.9 % (ref 34.0–46.6)
Hemoglobin: 14.6 g/dL (ref 11.1–15.9)
Immature Grans (Abs): 0 10*3/uL (ref 0.0–0.1)
Immature Granulocytes: 0 %
Lymphocytes Absolute: 2.5 10*3/uL (ref 0.7–3.1)
Lymphs: 37 %
MCH: 30.9 pg (ref 26.6–33.0)
MCHC: 34 g/dL (ref 31.5–35.7)
MCV: 91 fL (ref 79–97)
Monocytes Absolute: 0.4 10*3/uL (ref 0.1–0.9)
Monocytes: 6 %
Neutrophils Absolute: 3.4 10*3/uL (ref 1.4–7.0)
Neutrophils: 49 %
Platelets: 161 10*3/uL (ref 150–450)
RBC: 4.73 x10E6/uL (ref 3.77–5.28)
RDW: 13.6 % (ref 11.7–15.4)
WBC: 6.8 10*3/uL (ref 3.4–10.8)

## 2021-06-10 LAB — CMP14+EGFR
ALT: 17 IU/L (ref 0–32)
AST: 21 IU/L (ref 0–40)
Albumin/Globulin Ratio: 2.2 (ref 1.2–2.2)
Albumin: 4.3 g/dL (ref 3.8–4.8)
Alkaline Phosphatase: 79 IU/L (ref 44–121)
BUN/Creatinine Ratio: 12 (ref 12–28)
BUN: 9 mg/dL (ref 8–27)
Bilirubin Total: 0.4 mg/dL (ref 0.0–1.2)
CO2: 27 mmol/L (ref 20–29)
Calcium: 9.6 mg/dL (ref 8.7–10.3)
Chloride: 104 mmol/L (ref 96–106)
Creatinine, Ser: 0.73 mg/dL (ref 0.57–1.00)
Globulin, Total: 2 g/dL (ref 1.5–4.5)
Glucose: 92 mg/dL (ref 70–99)
Potassium: 4 mmol/L (ref 3.5–5.2)
Sodium: 142 mmol/L (ref 134–144)
Total Protein: 6.3 g/dL (ref 6.0–8.5)
eGFR: 93 mL/min/{1.73_m2} (ref >59)

## 2021-06-10 LAB — TSH+FREE T4
Free T4: 1.32 ng/dL (ref 0.82–1.77)
TSH: 1.73 u[IU]/mL (ref 0.450–4.500)

## 2021-06-13 ENCOUNTER — Ambulatory Visit: Payer: BC Managed Care – PPO | Admitting: Family Medicine

## 2021-06-13 ENCOUNTER — Encounter: Payer: Self-pay | Admitting: Family Medicine

## 2021-06-13 ENCOUNTER — Other Ambulatory Visit: Payer: Self-pay

## 2021-06-13 VITALS — BP 137/80 | HR 85 | Temp 97.3°F | Ht 63.0 in | Wt 198.0 lb

## 2021-06-13 DIAGNOSIS — E032 Hypothyroidism due to medicaments and other exogenous substances: Secondary | ICD-10-CM

## 2021-06-13 DIAGNOSIS — E7800 Pure hypercholesterolemia, unspecified: Secondary | ICD-10-CM

## 2021-06-13 DIAGNOSIS — I10 Essential (primary) hypertension: Secondary | ICD-10-CM | POA: Diagnosis not present

## 2021-06-13 DIAGNOSIS — K219 Gastro-esophageal reflux disease without esophagitis: Secondary | ICD-10-CM

## 2021-06-13 DIAGNOSIS — E039 Hypothyroidism, unspecified: Secondary | ICD-10-CM | POA: Diagnosis not present

## 2021-06-13 DIAGNOSIS — J4530 Mild persistent asthma, uncomplicated: Secondary | ICD-10-CM

## 2021-06-13 MED ORDER — MONTELUKAST SODIUM 10 MG PO TABS: ORAL_TABLET | ORAL | 3 refills | Status: DC

## 2021-06-13 MED ORDER — ICOSAPENT ETHYL 1 G PO CAPS: 1.0000 g | ORAL_CAPSULE | Freq: Two times a day (BID) | ORAL | 3 refills | Status: DC

## 2021-06-13 MED ORDER — LEVOTHYROXINE SODIUM 125 MCG PO TABS: 125.0000 ug | ORAL_TABLET | Freq: Every day | ORAL | 3 refills | Status: DC

## 2021-06-13 MED ORDER — ESOMEPRAZOLE MAGNESIUM 40 MG PO CPDR
40.0000 mg | DELAYED_RELEASE_CAPSULE | ORAL | 5 refills | Status: DC | PRN
Start: 2021-06-13 — End: 2022-06-20

## 2021-06-13 MED ORDER — LOSARTAN POTASSIUM-HCTZ 100-12.5 MG PO TABS
1.0000 | ORAL_TABLET | Freq: Every day | ORAL | 3 refills | Status: DC
Start: 2021-06-13 — End: 2022-06-20

## 2021-06-13 NOTE — Progress Notes (Signed)
Subjective:  Patient ID: Sarah Garrett, female    DOB: 1960-09-07  Age: 60 y.o. MRN: [id number]  CC: Medical Management of Chronic Issues   HPI Sarah Garrett presents for  follow-up of hypertension. Patient has no history of headache chest pain or shortness of breath or recent cough. Patient also denies symptoms of TIA such as focal numbness or weakness. Patient denies side effects from medication. States taking it regularly.   follow-up on  thyroid. The patient has a history of hypothyroidism for many years. It has been stable recently. Pt. denies any change in  voice, loss of hair, heat or cold intolerance. Energy level has been adequate to good. Patient denies constipation and diarrhea. No myxedema. Medication is as noted below. Verified that pt is taking it daily on an empty stomach. Well tolerated.    History [facility] has a past medical history of Anemia, Diverticulosis, GERD (gastroesophageal reflux disease), H/O adenoidectomy, Hemochromatosis (07/07/2011), Hyperlipidemia, Hypertension, Melanoma ([place]), and Thyroid disease.   She has a past surgical history that includes Tonsillectomy and adenoidectomy; Breast biopsy (2004); Melanoma excision (09/28/11); and Colonoscopy with propofol (N/A, 06/29/2020).   Her family history includes Heart disease in her mother.She reports that she quit smoking about 43 years ago. Her smoking use included cigarettes. She started smoking about 46 years ago. She has a 0.75 pack-year smoking history. She has never used smokeless tobacco. She reports that she does not drink alcohol and does not use drugs.  Current Outpatient Medications on File Prior to Visit  Medication Sig Dispense Refill   albuterol (VENTOLIN HFA) 108 (90 Base) MCG/ACT inhaler INHALE 2 PUFFS BY MOUTH EVERY 6 HOURS AS NEEDED 18 each 5   cetirizine (ZYRTEC) 10 MG tablet Take 1 tablet (10 mg total) by mouth at bedtime. 30 tablet 5   EPINEPHrine 0.3 mg/0.3 mL IJ SOAJ injection Inject 0.3  mLs (0.3 mg total) into the muscle as needed for anaphylaxis. 1 each 3   Fluticasone-Salmeterol (ADVAIR DISKUS) 100-50 MCG/DOSE AEPB TAKE 1 PUFF BY MOUTH TWICE A DAY 180 each 5   furosemide (LASIX) 40 MG tablet Take 1 tablet (40 mg total) by mouth daily as needed. 90 tablet 5   hyoscyamine (LEVSIN SL) 0.125 MG SL tablet Place 1 tablet (0.125 mg total) under the tongue as needed. Place 1-2 tablet under the tongue until it dissolves as needed 30 tablet 5   Multiple Vitamins-Minerals (MULTIVITAMIN WITH MINERALS) tablet Take 1 tablet by mouth daily. One a day woman     naproxen (NAPROSYN) 500 MG tablet Take 500 mg by mouth daily as needed (Back pain).     ondansetron (ZOFRAN) 4 MG tablet Take 1 tablet (4 mg total) by mouth every 6 (six) hours as needed for nausea. 20 tablet 0   potassium chloride (KLOR-CON) 10 MEQ tablet TAKE 1 TABLET BY MOUTH EVERY DAY 90 tablet 1   rosuvastatin (CRESTOR) 20 MG tablet TAKE 1 TABLET (20 MG TOTAL) BY MOUTH DAILY. 90 tablet 1   Vitamin D, Ergocalciferol, (DRISDOL) 1.25 MG (50000 UNIT) CAPS capsule Take 1 capsule (50,000 Units total) by mouth every 7 (seven) days. 12 capsule 3   No current facility-administered medications on file prior to visit.    ROS Review of Systems  Constitutional: Negative.   HENT: Negative.    Eyes:  Negative for visual disturbance.  Respiratory:  Negative for shortness of breath.   Cardiovascular:  Negative for chest pain.  Gastrointestinal:  Negative for abdominal pain.  Musculoskeletal:  Negative for arthralgias.   Objective:  BP 137/80   Pulse 85   Temp (!) 97.3 F (36.3 C)   Ht 5' 3"  (1.6 m)   Wt 198 lb (89.8 kg)   LMP 11/29/2011   SpO2 95%   BMI 35.07 kg/m   BP Readings from Last 3 Encounters:  06/13/21 137/80  12/14/20 113/73  10/04/20 125/81    Wt Readings from Last 3 Encounters:  06/13/21 198 lb (89.8 kg)  12/14/20 192 lb 3.2 oz (87.2 kg)  10/04/20 191 lb 9.6 oz (86.9 kg)     Physical Exam Constitutional:       General: She is not in acute distress.    Appearance: She is well-developed.  Cardiovascular:     Rate and Rhythm: Normal rate and regular rhythm.  Pulmonary:     Breath sounds: Normal breath sounds.  Musculoskeletal:        General: Normal range of motion.  Skin:    General: Skin is warm and dry.  Neurological:     Mental Status: She is alert and oriented to person, place, and time.      Assessment & Plan:   Sarah Garrett was seen today for medical management of chronic issues.  Diagnoses and all orders for this visit:  Acquired hypothyroidism -     TSH + free T4  Essential hypertension -     CBC with Differential/Platelet -     CMP14+EGFR -     losartan-hydrochlorothiazide (HYZAAR) 100-12.5 MG tablet; Take 1 tablet by mouth daily. as directed  Pure hypercholesterolemia -     Lipid panel -     icosapent Ethyl (VASCEPA) 1 g capsule; Take 1 capsule (1 g total) by mouth 2 (two) times daily.  Hypothyroidism due to medication -     levothyroxine (SYNTHROID) 125 MCG tablet; Take 1 tablet (125 mcg total) by mouth daily.  Mild persistent asthma without complication -     montelukast (SINGULAIR) 10 MG tablet; TAKE 1 TABLET BY MOUTH EVERYDAY AT BEDTIME  Gastroesophageal reflux disease, unspecified whether esophagitis present -     esomeprazole (NEXIUM) 40 MG capsule; Take 1 capsule (40 mg total) by mouth as needed.  Allergies as of 06/13/2021       Reactions   Aspirin Swelling   Swelling of mouth and throat   Ace Inhibitors Other (See Comments)   Per IR:JJOACZY   Actos [pioglitazone Hydrochloride] Other (See Comments)   Per pt: unknown   Alpha-gal Other (See Comments)   unknown   Ciprofloxacin Other (See Comments)   Unknown reaction   Crestor [rosuvastatin Calcium] Other (See Comments)   Per pt: unknown   Pneumococcal Vaccines Rash   Sulfonamide Derivatives Other (See Comments)   Swelling of mouth and throat        Medication List        Accurate as of  June 13, 2021  7:34 PM. If you have any questions, ask your nurse or doctor.          albuterol 108 (90 Base) MCG/ACT inhaler Commonly known as: Ventolin HFA INHALE 2 PUFFS BY MOUTH EVERY 6 HOURS AS NEEDED   cetirizine 10 MG tablet Commonly known as: ZYRTEC Take 1 tablet (10 mg total) by mouth at bedtime.   EPINEPHrine 0.3 mg/0.3 mL Soaj injection Commonly known as: EPI-PEN Inject 0.3 mLs (0.3 mg total) into the muscle as needed for anaphylaxis.   esomeprazole 40 MG capsule Commonly known as: NexIUM Take 1 capsule (40 mg total)  by mouth as needed.   Fluticasone-Salmeterol 100-50 MCG/DOSE Aepb Commonly known as: Advair Diskus TAKE 1 PUFF BY MOUTH TWICE A DAY   furosemide 40 MG tablet Commonly known as: LASIX Take 1 tablet (40 mg total) by mouth daily as needed.   hyoscyamine 0.125 MG SL tablet Commonly known as: LEVSIN SL Place 1 tablet (0.125 mg total) under the tongue as needed. Place 1-2 tablet under the tongue until it dissolves as needed   icosapent Ethyl 1 g capsule Commonly known as: VASCEPA Take 1 capsule (1 g total) by mouth 2 (two) times daily.   levothyroxine 125 MCG tablet Commonly known as: SYNTHROID Take 1 tablet (125 mcg total) by mouth daily.   losartan-hydrochlorothiazide 100-12.5 MG tablet Commonly known as: HYZAAR Take 1 tablet by mouth daily. as directed   montelukast 10 MG tablet Commonly known as: SINGULAIR TAKE 1 TABLET BY MOUTH EVERYDAY AT BEDTIME   multivitamin with minerals tablet Take 1 tablet by mouth daily. One a day woman   naproxen 500 MG tablet Commonly known as: NAPROSYN Take 500 mg by mouth daily as needed (Back pain).   ondansetron 4 MG tablet Commonly known as: ZOFRAN Take 1 tablet (4 mg total) by mouth every 6 (six) hours as needed for nausea.   potassium chloride 10 MEQ tablet Commonly known as: KLOR-CON TAKE 1 TABLET BY MOUTH EVERY DAY   rosuvastatin 20 MG tablet Commonly known as: CRESTOR TAKE 1 TABLET (20  MG TOTAL) BY MOUTH DAILY.   Vitamin D (Ergocalciferol) 1.25 MG (50000 UNIT) Caps capsule Commonly known as: DRISDOL Take 1 capsule (50,000 Units total) by mouth every 7 (seven) days.        Meds ordered this encounter  Medications   icosapent Ethyl (VASCEPA) 1 g capsule    Sig: Take 1 capsule (1 g total) by mouth 2 (two) times daily.    Dispense:  180 capsule    Refill:  3   levothyroxine (SYNTHROID) 125 MCG tablet    Sig: Take 1 tablet (125 mcg total) by mouth daily.    Dispense:  90 tablet    Refill:  3   losartan-hydrochlorothiazide (HYZAAR) 100-12.5 MG tablet    Sig: Take 1 tablet by mouth daily. as directed    Dispense:  90 tablet    Refill:  3   montelukast (SINGULAIR) 10 MG tablet    Sig: TAKE 1 TABLET BY MOUTH EVERYDAY AT BEDTIME    Dispense:  90 tablet    Refill:  3   esomeprazole (NEXIUM) 40 MG capsule    Sig: Take 1 capsule (40 mg total) by mouth as needed.    Dispense:  30 capsule    Refill:  5      Follow-up: Return in about 6 months (around 12/11/2021) for [facility] physical.  Claretta Fraise, M.D.

## 2021-06-14 ENCOUNTER — Encounter: Payer: Self-pay | Admitting: Physical Therapy

## 2021-06-14 ENCOUNTER — Ambulatory Visit: Payer: BC Managed Care – PPO | Admitting: Family Medicine

## 2021-06-14 ENCOUNTER — Ambulatory Visit: Payer: BC Managed Care – PPO | Admitting: Physical Therapy

## 2021-06-14 DIAGNOSIS — M25661 Stiffness of right knee, not elsewhere classified: Secondary | ICD-10-CM | POA: Diagnosis not present

## 2021-06-14 DIAGNOSIS — M25561 Pain in right knee: Secondary | ICD-10-CM | POA: Diagnosis not present

## 2021-06-14 DIAGNOSIS — R600 Localized edema: Secondary | ICD-10-CM

## 2021-06-14 DIAGNOSIS — G8929 Other chronic pain: Secondary | ICD-10-CM

## 2021-06-14 NOTE — Therapy (Signed)
[facility] [place], [place], [address] Phone: [phone]   Fax:  [phone]  Physical Therapy Treatment  Patient Details  Name: Sarah Garrett MRN: [id number] Date of Birth: March 17, 1961 Referring Provider (PT): Esmond Plants MD   Encounter Date: 06/14/2021   PT End of Session - 06/14/21 1514     Visit Number 15    Number of Visits 20    Date for PT Re-Evaluation 06/28/21    PT Start Time 4967    PT Stop Time 1519    PT Time Calculation (min) 47 min    Activity Tolerance Patient tolerated treatment well    Behavior During Therapy [facility] for tasks assessed/performed             Past Medical History:  Diagnosis Date   Anemia    Diverticulosis    GERD (gastroesophageal reflux disease)    H/O adenoidectomy    Hemochromatosis 07/07/2011   Hyperlipidemia    Hypertension    Melanoma ([place])    right shoulder   Thyroid disease     Past Surgical History:  Procedure Laterality Date   BREAST BIOPSY  2004   left; benign   COLONOSCOPY WITH PROPOFOL N/A 06/29/2020   Procedure: COLONOSCOPY WITH PROPOFOL;  Surgeon: Rogene Houston, MD;  [facility];  Service: Endoscopy;  Laterality: N/A;   MELANOMA EXCISION  09/28/11   right shoulder   TONSILLECTOMY AND ADENOIDECTOMY      There were no vitals filed for this visit.   Subjective Assessment - 06/14/21 1434     Subjective COVID-19 screen performed prior to patient entering clinic. Reports that she started driving last night, not using AD in the rain and slept in her upstairs bedroom last night. States that she didn't sleep well after being restless.    Pertinent History Long h/o right knee pain, HTN, asthma, thyroid problem.    How long can you sit comfortably? Limiting this and lying and elevating her LE's.    How long can you walk comfortably? Walking all day but not comfortable    Patient Stated Goals "Recover from surgery."    Currently in Pain? Yes     Pain Score 2     Pain Location Knee    Pain Orientation Right    Pain Descriptors / Indicators Aching;Sore;Throbbing    Pain Type Surgical pain    Pain Onset More than a month ago    Pain Frequency Intermittent                [facility] PT Assessment - 06/14/21 0001       Assessment   Medical Diagnosis Right knee arthroscopic surgery    Referring Provider (PT) Esmond Plants MD    Onset Date/Surgical Date 04/06/21    Next MD Visit None      Precautions   Precaution Comments PAIN-FREE RIGHT LE THER EX.      Restrictions   Weight Bearing Restrictions No                           [facility] Adult PT Treatment/Exercise - 06/14/21 0001       Knee/Hip Exercises: Aerobic   Recumbent Bike Lvl 3 x 15 min      Knee/Hip Exercises: Machines for Strengthening   Cybex Knee Extension 20# x20 reps    Cybex Knee Flexion 40# x20 reps      Knee/Hip Exercises: Standing  Lateral Step Up Right;20 reps;Hand Hold: 2;Step Height: 6"    Forward Step Up Right;20 reps;Step Height: 8";Hand Hold: 2    Step Down Right;20 reps;Hand Hold: 2;Step Height: 4"    SLS RLE SLS 2x max      Modalities   Modalities Vasopneumatic      Vasopneumatic   Number Minutes Vasopneumatic  10 minutes    Vasopnuematic Location  Knee    Vasopneumatic Pressure Low    Vasopneumatic Temperature  34                          PT Long Term Goals - 05/31/21 1537       PT LONG TERM GOAL #1   Title Independent with a HEP.    Time 4    Period Weeks    Status On-going      PT LONG TERM GOAL #2   Title Rgith knee active flexion to 125 degrees.    Time 4    Period Weeks    Status Achieved      PT LONG TERM GOAL #3   Title Perform ADL's with pain not > 2-3/10.    Time 4    Period Weeks    Status Achieved      PT LONG TERM GOAL #4   Title Perform a reciprocating stair gait with one railing.    Time 4    Period Weeks    Status Partially Met      PT LONG TERM GOAL #5   Title  Walk a community without assistive device.    Time 4    Period Weeks    Status Partially Met   Still takes AD but can walk without it                  Plan - 06/14/21 1515     Clinical Impression Statement Patient presented with minimal R knee pain. Patient able to tolerate all standing and machine strength training today without complaint of pain. Patient fatigued especially with machine strengthening. Patient able to tolerate approximately one minute for RLE SLS before L toe touch or UE support required in parallel bars were required. Normal vasopnuematic response noted following removal of the modality.    Personal Factors and Comorbidities Comorbidity 1;Other    Comorbidities Long h/o right knee pain, HTN, asthma, thyroid problem.    Examination-Activity Limitations Other;Locomotion Level    Examination-Participation Restrictions Other    Stability/Clinical Decision Making Stable/Uncomplicated    Rehab Potential Excellent    PT Frequency 3x / week    PT Duration 4 weeks    PT Treatment/Interventions ADLs/Self Care Home Management;Cryotherapy;Electrical Stimulation;Gait training;Stair training;Functional mobility training;Therapeutic activities;Therapeutic exercise;Neuromuscular re-education;Manual techniques;Patient/family education;Passive range of motion    PT Next Visit Plan Nustep, pain-free right LE ther ex. (O and CKC)  .  PRE's.  Vasopneumatic.    Consulted and Agree with Plan of Care Patient             Patient will benefit from skilled therapeutic intervention in order to improve the following deficits and impairments:  Abnormal gait, Decreased mobility, Decreased activity tolerance, Decreased range of motion, Increased edema, Difficulty walking, Pain  Visit Diagnosis: Chronic pain of right knee  Stiffness of right knee, not elsewhere classified  Localized edema     Problem List Patient Active Problem List   Diagnosis Date Noted   Diverticulitis of  intestine with perforation and abscess 03/22/2020  Hypokalemia 03/22/2020   Diverticulitis of large intestine with perforation without bleeding    Morbid obesity ([place]) 09/26/2019   Allergy to alpha-gal 03/14/2019   Contact dermatitis 03/24/2013   Asthma 03/24/2013   Melanoma of upper arm, right. T1a., History of 09/07/2011   Hemochromatosis 07/07/2011   Hypothyroidism 02/17/2008   Hyperlipidemia 10/11/2007   ANEMIA-NOS 10/11/2007   Essential hypertension 10/11/2007   ALLERGIC RHINITIS 10/11/2007   BRONCHITIS, CHRONIC 10/11/2007   GERD 10/11/2007   ADENOIDECTOMY, HX OF 10/11/2007    Standley Brooking, PTA 06/14/2021, 3:27 PM  [facility] [address] [place], [place], [address] Phone: [phone]   Fax:  [phone]  Name: Sarah Garrett MRN: [id number] Date of Birth: 09-29-1960

## 2021-06-15 LAB — SPECIMEN STATUS REPORT

## 2021-06-15 LAB — LIPID PANEL W/O CHOL/HDL RATIO
Cholesterol, Total: 138 mg/dL (ref 100–199)
HDL: 46 mg/dL (ref >39)
LDL Chol Calc (NIH): 71 mg/dL (ref 0–99)
Triglycerides: 115 mg/dL (ref 0–149)
VLDL Cholesterol Cal: 21 mg/dL (ref 5–40)

## 2021-06-15 NOTE — Progress Notes (Signed)
Hello Nabila,  Your lab result is normal and/or stable.Some minor variations that are not significant are commonly marked abnormal, but do not represent any medical problem for you.  Best regards, Omer Puccinelli, M.D.

## 2021-06-16 ENCOUNTER — Other Ambulatory Visit: Payer: Self-pay

## 2021-06-16 ENCOUNTER — Ambulatory Visit: Payer: BC Managed Care – PPO | Admitting: Physical Therapy

## 2021-06-16 ENCOUNTER — Encounter: Payer: Self-pay | Admitting: Physical Therapy

## 2021-06-16 DIAGNOSIS — G8929 Other chronic pain: Secondary | ICD-10-CM | POA: Diagnosis not present

## 2021-06-16 DIAGNOSIS — M25661 Stiffness of right knee, not elsewhere classified: Secondary | ICD-10-CM

## 2021-06-16 DIAGNOSIS — R600 Localized edema: Secondary | ICD-10-CM | POA: Diagnosis not present

## 2021-06-16 DIAGNOSIS — M25561 Pain in right knee: Secondary | ICD-10-CM | POA: Diagnosis not present

## 2021-06-16 NOTE — Therapy (Signed)
[facility] [place], [place], [address] Phone: [phone]   Fax:  [phone]  Physical Therapy Treatment  Patient Details  Name: Sarah Garrett MRN: [id number] Date of Birth: [birth year] Referring Provider (PT): Esmond Plants MD   Encounter Date: 06/16/2021   PT End of Session - 06/16/21 1439     Visit Number 16    Number of Visits 20    Date for PT Re-Evaluation 06/28/21    PT Start Time 1430    PT Stop Time 1519    PT Time Calculation (min) 49 min    Activity Tolerance Patient tolerated treatment well    Behavior During Therapy [facility] for tasks assessed/performed             Past Medical History:  Diagnosis Date   Anemia    Diverticulosis    GERD (gastroesophageal reflux disease)    H/O adenoidectomy    Hemochromatosis 07/07/2011   Hyperlipidemia    Hypertension    Melanoma ([place])    right shoulder   Thyroid disease     Past Surgical History:  Procedure Laterality Date   BREAST BIOPSY  2004   left; benign   COLONOSCOPY WITH PROPOFOL N/A 06/29/2020   Procedure: COLONOSCOPY WITH PROPOFOL;  Surgeon: Rogene Houston, MD;  [facility];  Service: Endoscopy;  Laterality: N/A;   MELANOMA EXCISION  09/28/11   right shoulder   TONSILLECTOMY AND ADENOIDECTOMY      There were no vitals filed for this visit.   Subjective Assessment - 06/16/21 1439     Subjective COVID-19 screen performed prior to patient entering clinic. Had worse pain yesterday.    Pertinent History Long h/o right knee pain, HTN, asthma, thyroid problem.    How long can you sit comfortably? Limiting this and lying and elevating her LE's.    How long can you walk comfortably? Walking all day but not comfortable    Patient Stated Goals "Recover from surgery."    Currently in Pain? No/denies                [facility] PT Assessment - 06/16/21 0001       Assessment   Medical Diagnosis Right knee arthroscopic surgery     Referring Provider (PT) Esmond Plants MD    Onset Date/Surgical Date 04/06/21    Next MD Visit None      Precautions   Precaution Comments PAIN-FREE RIGHT LE THER EX.                           [place] Adult PT Treatment/Exercise - 06/16/21 0001       Knee/Hip Exercises: Aerobic   Recumbent Bike Lvl 3 x 15 min      Knee/Hip Exercises: Machines for Strengthening   Cybex Knee Extension 20# x20 reps    Cybex Knee Flexion 30# x20 reps    Cybex Leg Press 1 plate, seat 6, x 20 reps      Knee/Hip Exercises: Standing   Heel Raises Both;15 reps   at file cabinet   Heel Raises Limitations B toe raise x15 reps at file cabinet    Walking with Sports Cord Backwards, R sidestep blue XTS x10 reps   lead with RLE     Modalities   Modalities Vasopneumatic      Vasopneumatic   Number Minutes Vasopneumatic  10 minutes    Vasopnuematic Location  Knee    Vasopneumatic  Pressure Low    Vasopneumatic Temperature  34                          PT Long Term Goals - 05/31/21 1537       PT LONG TERM GOAL #1   Title Independent with a HEP.    Time 4    Period Weeks    Status On-going      PT LONG TERM GOAL #2   Title Rgith knee active flexion to 125 degrees.    Time 4    Period Weeks    Status Achieved      PT LONG TERM GOAL #3   Title Perform ADL's with pain not > 2-3/10.    Time 4    Period Weeks    Status Achieved      PT LONG TERM GOAL #4   Title Perform a reciprocating stair gait with one railing.    Time 4    Period Weeks    Status Partially Met      PT LONG TERM GOAL #5   Title Walk a community without assistive device.    Time 4    Period Weeks    Status Partially Met   Still takes AD but can walk without it                  Plan - 06/16/21 1541     Clinical Impression Statement Patient presented in clinic with reports of no R knee pain. Greater R knee discomfort reported yesterday but was also reporting discomfort throughout  LEs. Less resistance utilized on machine knee flexion and extension to reduce discomfort later. Patient observed ambulating with varying degrees of antalgic deviations, but as she fatigues, deviations increase. Normal vasopneumatic response noted following removal of the modality.    Personal Factors and Comorbidities Comorbidity 1;Other    Comorbidities Long h/o right knee pain, HTN, asthma, thyroid problem.    Examination-Activity Limitations Other;Locomotion Level    Examination-Participation Restrictions Other    Stability/Clinical Decision Making Stable/Uncomplicated    Rehab Potential Excellent    PT Frequency 3x / week    PT Duration 4 weeks    PT Treatment/Interventions ADLs/Self Care Home Management;Cryotherapy;Electrical Stimulation;Gait training;Stair training;Functional mobility training;Therapeutic activities;Therapeutic exercise;Neuromuscular re-education;Manual techniques;Patient/family education;Passive range of motion    PT Next Visit Plan Nustep, pain-free right LE ther ex. (O and CKC)  .  PRE's.  Vasopneumatic.    Consulted and Agree with Plan of Care Patient             Patient will benefit from skilled therapeutic intervention in order to improve the following deficits and impairments:  Abnormal gait, Decreased mobility, Decreased activity tolerance, Decreased range of motion, Increased edema, Difficulty walking, Pain  Visit Diagnosis: Chronic pain of right knee  Stiffness of right knee, not elsewhere classified  Localized edema     Problem List Patient Active Problem List   Diagnosis Date Noted   Diverticulitis of intestine with perforation and abscess 03/22/2020   Hypokalemia 03/22/2020   Diverticulitis of large intestine with perforation without bleeding    Morbid obesity ([place]) 09/26/2019   Allergy to alpha-gal 03/14/2019   Contact dermatitis 03/24/2013   Asthma 03/24/2013   Melanoma of upper arm, right. T1a., History of 09/07/2011   Hemochromatosis  07/07/2011   Hypothyroidism 02/17/2008   Hyperlipidemia 10/11/2007   ANEMIA-NOS 10/11/2007   Essential hypertension 10/11/2007   ALLERGIC RHINITIS 10/11/2007   BRONCHITIS,  CHRONIC 10/11/2007   GERD 10/11/2007   ADENOIDECTOMY, HX OF 10/11/2007    Standley Brooking, PTA 06/16/2021, 3:49 PM  [facility] [address] [place], [place], [address] Phone: [phone]   Fax:  [phone]  Name: Sarah Garrett MRN: [id number] Date of Birth: [birth year]

## 2021-06-22 ENCOUNTER — Ambulatory Visit: Payer: BC Managed Care – PPO | Admitting: Physical Therapy

## 2021-06-22 ENCOUNTER — Encounter: Payer: Self-pay | Admitting: Physical Therapy

## 2021-06-22 ENCOUNTER — Other Ambulatory Visit: Payer: Self-pay

## 2021-06-22 DIAGNOSIS — G8929 Other chronic pain: Secondary | ICD-10-CM | POA: Diagnosis not present

## 2021-06-22 DIAGNOSIS — M25561 Pain in right knee: Secondary | ICD-10-CM | POA: Diagnosis not present

## 2021-06-22 DIAGNOSIS — R600 Localized edema: Secondary | ICD-10-CM | POA: Diagnosis not present

## 2021-06-22 DIAGNOSIS — M25661 Stiffness of right knee, not elsewhere classified: Secondary | ICD-10-CM

## 2021-06-22 NOTE — Therapy (Addendum)
[facility] [place], [place], [address] Phone: [phone]   Fax:  [phone]  Physical Therapy Treatment  Patient Details  Name: Sarah Garrett MRN: [id number] Date of Birth: 10/04/1960 Referring Provider (PT): Esmond Plants MD   Encounter Date: 06/22/2021   PT End of Session - 06/22/21 1309     Visit Number 17    Number of Visits 20    Date for PT Re-Evaluation 06/28/21    PT Start Time 4401    PT Stop Time 0272    PT Time Calculation (min) 51 min    Activity Tolerance Patient tolerated treatment well    Behavior During Therapy [facility] for tasks assessed/performed             Past Medical History:  Diagnosis Date   Anemia    Diverticulosis    GERD (gastroesophageal reflux disease)    H/O adenoidectomy    Hemochromatosis 07/07/2011   Hyperlipidemia    Hypertension    Melanoma ([place])    right shoulder   Thyroid disease     Past Surgical History:  Procedure Laterality Date   BREAST BIOPSY  2004   left; benign   COLONOSCOPY WITH PROPOFOL N/A 06/29/2020   Procedure: COLONOSCOPY WITH PROPOFOL;  Surgeon: Rogene Houston, MD;  [facility];  Service: Endoscopy;  Laterality: N/A;   MELANOMA EXCISION  09/28/11   right shoulder   TONSILLECTOMY AND ADENOIDECTOMY      There were no vitals filed for this visit.   Subjective Assessment - 06/22/21 1308     Subjective COVID-19 screen performed prior to patient entering clinic. Has some tightness while sitting for work and some discomfort upon standing.    Pertinent History Long h/o right knee pain, HTN, asthma, thyroid problem.    How long can you sit comfortably? Limiting this and lying and elevating her LE's.    How long can you walk comfortably? Walking all day but not comfortable    Patient Stated Goals "Recover from surgery."    Currently in Pain? No/denies                [facility] PT Assessment - 06/22/21 0001       Assessment   Medical  Diagnosis Right knee arthroscopic surgery    Referring Provider (PT) Esmond Plants MD    Onset Date/Surgical Date 04/06/21    Next MD Visit None      Precautions   Precaution Comments PAIN-FREE RIGHT LE THER EX.                           [place] Adult PT Treatment/Exercise - 06/22/21 0001       Ambulation/Gait   Stairs Yes    Stairs Assistance 6: Modified independent (Device/Increase time)    Stair Management Technique One rail Right;Alternating pattern;Forwards    Number of Stairs 4   x 1   Height of Stairs 6.5      Knee/Hip Exercises: Aerobic   Recumbent Bike Lvl 4x 15 min      Knee/Hip Exercises: Machines for Strengthening   Cybex Knee Extension 10# x30 reps    Cybex Knee Flexion 30# x30 reps    Cybex Leg Press 1 plate, seat 6, x 30 reps      Modalities   Modalities Vasopneumatic      Vasopneumatic   Number Minutes Vasopneumatic  10 minutes    Vasopnuematic Location  Knee  Vasopneumatic Pressure Low    Vasopneumatic Temperature  34                          PT Long Term Goals - 06/22/21 1321       PT LONG TERM GOAL #1   Title Independent with a HEP.    Time 4    Period Weeks    Status Unable to assess      PT LONG TERM GOAL #2   Title Rgith knee active flexion to 125 degrees.    Time 4    Period Weeks    Status Achieved      PT LONG TERM GOAL #3   Title Perform ADL's with pain not > 2-3/10.    Time 4    Period Weeks    Status Achieved      PT LONG TERM GOAL #4   Title Perform a reciprocating stair gait with one railing.    Time 4    Period Weeks    Status Achieved   min discomfort with descending     PT LONG TERM GOAL #5   Title Walk a community without assistive device.    Time 4    Period Weeks    Status Achieved   Still takes AD but can walk without it                  Plan - 06/22/21 1349     Clinical Impression Statement Patient presented in clinic with reports of no current R knee pain.  Patient does experience intermittant R knee tightness in sitting and some discomfort when sit > stand. Patient encouraged to continue icing to control the continued inflammation especially as she short sits while at work. Patient more independent within the community and has discontinued the AD. Patient met all goals at this time. Normal vasopneumatic response noted following removal of the modality.    Personal Factors and Comorbidities Comorbidity 1;Other    Comorbidities Long h/o right knee pain, HTN, asthma, thyroid problem.    Examination-Activity Limitations Other;Locomotion Level    Examination-Participation Restrictions Other    Stability/Clinical Decision Making Stable/Uncomplicated    Rehab Potential Excellent    PT Frequency 3x / week    PT Duration 4 weeks    PT Treatment/Interventions ADLs/Self Care Home Management;Cryotherapy;Electrical Stimulation;Gait training;Stair training;Functional mobility training;Therapeutic activities;Therapeutic exercise;Neuromuscular re-education;Manual techniques;Patient/family education;Passive range of motion    PT Next Visit Plan Nustep, pain-free right LE ther ex. (O and CKC)  .  PRE's.  Vasopneumatic.    Consulted and Agree with Plan of Care Patient             Patient will benefit from skilled therapeutic intervention in order to improve the following deficits and impairments:  Abnormal gait, Decreased mobility, Decreased activity tolerance, Decreased range of motion, Increased edema, Difficulty walking, Pain  Visit Diagnosis: Chronic pain of right knee  Stiffness of right knee, not elsewhere classified  Localized edema     Problem List Patient Active Problem List   Diagnosis Date Noted   Diverticulitis of intestine with perforation and abscess 03/22/2020   Hypokalemia 03/22/2020   Diverticulitis of large intestine with perforation without bleeding    Morbid obesity ([place]) 09/26/2019   Allergy to alpha-gal 03/14/2019   Contact  dermatitis 03/24/2013   Asthma 03/24/2013   Melanoma of upper arm, right. T1a., History of 09/07/2011   Hemochromatosis 07/07/2011   Hypothyroidism 02/17/2008   Hyperlipidemia  10/11/2007   ANEMIA-NOS 10/11/2007   Essential hypertension 10/11/2007   ALLERGIC RHINITIS 10/11/2007   BRONCHITIS, CHRONIC 10/11/2007   GERD 10/11/2007   ADENOIDECTOMY, HX OF 10/11/2007    Standley Brooking, PTA 06/22/2021, 2:20 PM  [facility] [address] [place], [place], [address] Phone: [phone]   Fax:  [phone]  Name: Sarah Garrett MRN: [id number] Date of Birth: 1960-09-06   PHYSICAL THERAPY DISCHARGE SUMMARY  Visits from Start of Care: 17.  Current functional level related to goals / functional outcomes: See above.   Remaining deficits: All goals essentially met.   Education / Equipment: HEP.   Patient agrees to discharge. Patient goals were partially met. Patient is being discharged due to meeting the stated rehab goals.    Mali Applegate MPT

## 2021-07-05 ENCOUNTER — Other Ambulatory Visit: Payer: Self-pay | Admitting: Family Medicine

## 2021-07-05 DIAGNOSIS — E559 Vitamin D deficiency, unspecified: Secondary | ICD-10-CM

## 2021-07-12 ENCOUNTER — Telehealth: Payer: Self-pay | Admitting: *Deleted

## 2021-07-12 NOTE — Telephone Encounter (Signed)
Sarah Garrett (Key: [id number]) Vascepa 1GM capsules  Sent to plan

## 2021-07-14 NOTE — Telephone Encounter (Signed)
Sarah Garrett (Key: [id number]) Vascepa 1GM capsules   Form Blue Building control surveyor Form (CB) Created 6 days ago Sent to Plan 2 days ago Plan Response 2 days ago Submit Clinical Questions 2 days ago Determination Favorable 1 hour ago Prior authorization for Sarah Garrett has been approved. RETURN TO DASHBOARD For patients covered by Medicare Part D, you may submit a tier exception form to the plan or PBM. If approved by the plan or PBM, it may reduce the patient's out-of-pocket costs. Type "tier exception" in the "plan or PBM" field on the Form Pick page to view eligible tier exception forms.  Message from plan: Effective from 07/12/2021 through 07/11/2022.

## 2021-07-27 IMAGING — US US ABDOMEN LIMITED W/ ELASTOGRAPHY
1 series · 12 of 12 positions shown · non-contrast
Comparison: March 22, 2020

CLINICAL DATA: History of hemochromatosis by report.

EXAM:
US ABDOMEN LIMITED - RIGHT UPPER QUADRANT
ULTRASOUND HEPATIC ELASTOGRAPHY
TECHNIQUE: Sonography of the right upper quadrant was performed. In addition,
ultrasound elastography evaluation of the liver was performed. A
region of interest was placed within the right lobe of the liver.
Following application of a compressive sonographic pulse, tissue
compressibility was assessed. Multiple assessments were performed at
the selected site. Median tissue compressibility was determined.
Previously, hepatic stiffness was assessed by shear wave velocity.
Based on recently published Society of Radiologists in Ultrasound
consensus article, reporting is now recommended to be performed in
the SI units of pressure (kiloPascals) representing hepatic
stiffness/elasticity. The obtained result is compared to the
published reference standards. (cACLD = compensated Advanced Chronic
Liver Disease)

[Series 1: us abdomen ruq w/elastography · 12 of 12 slices shown]
[im 1/12]
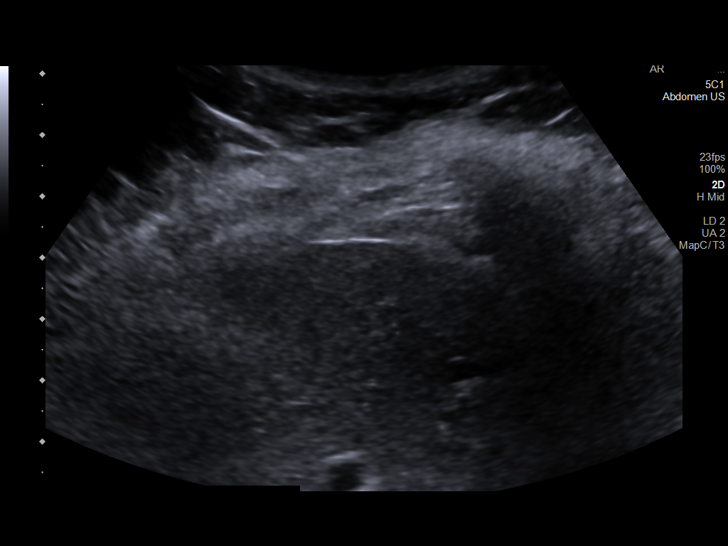
[im 2/12]
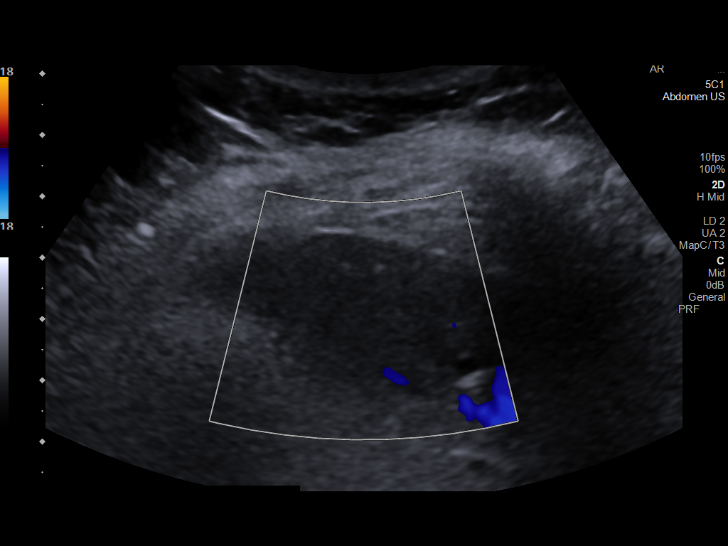
[im 3/12]
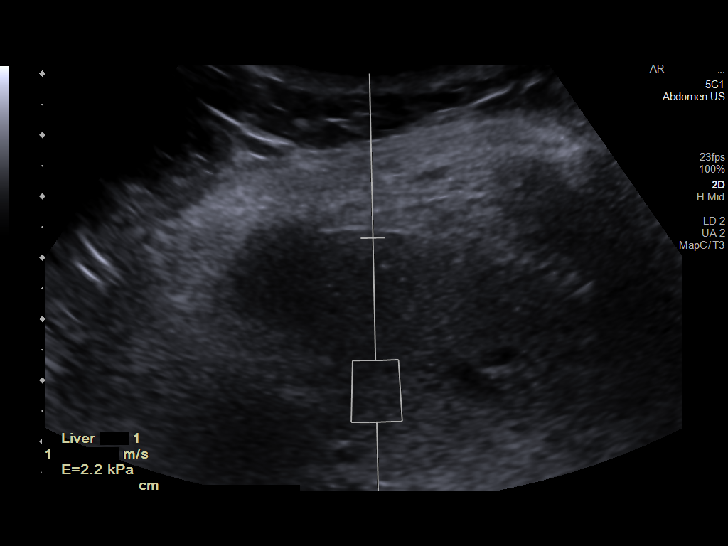
[im 4/12]
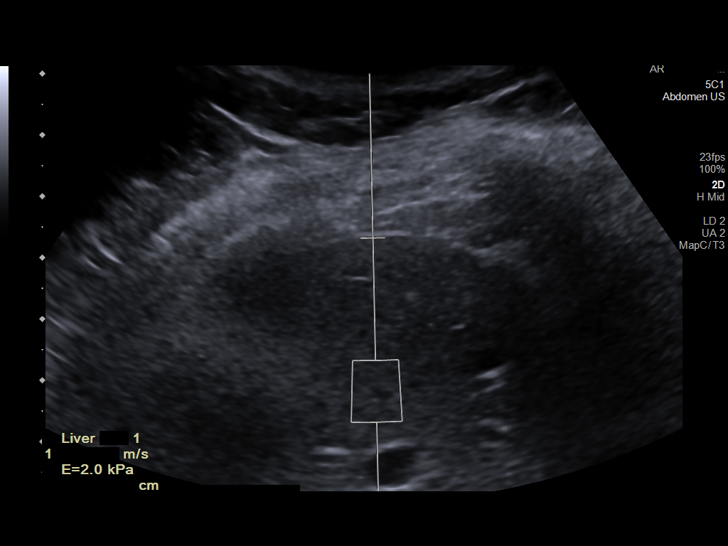
[im 5/12]
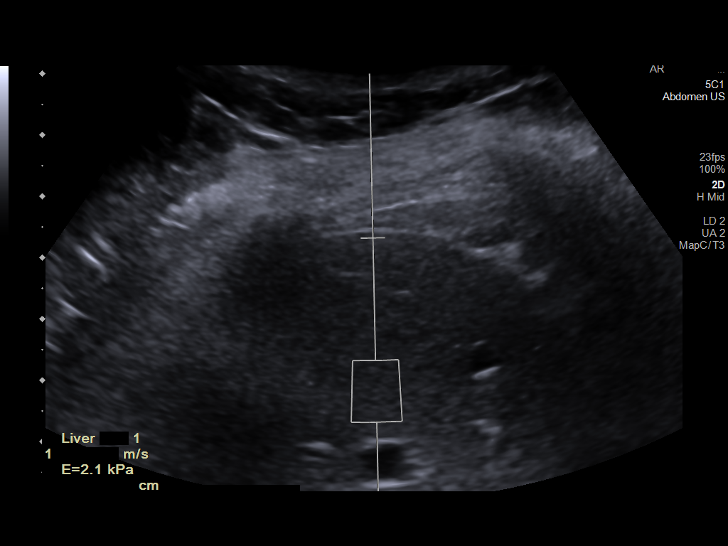
[im 6/12]
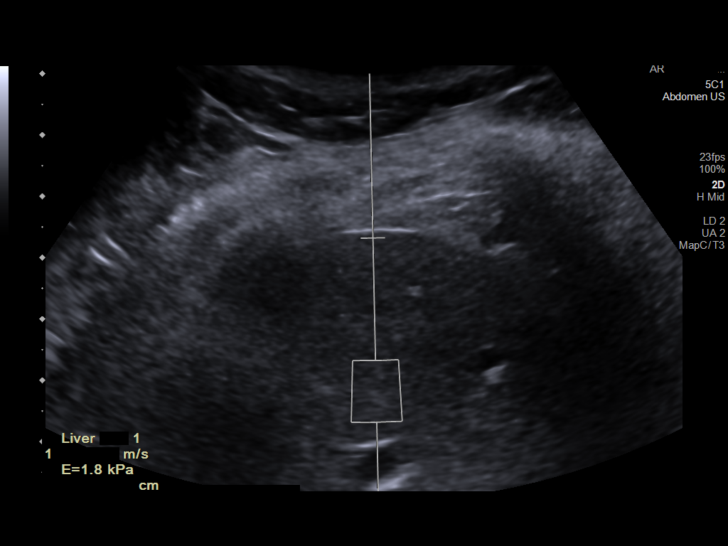
[im 7/12]
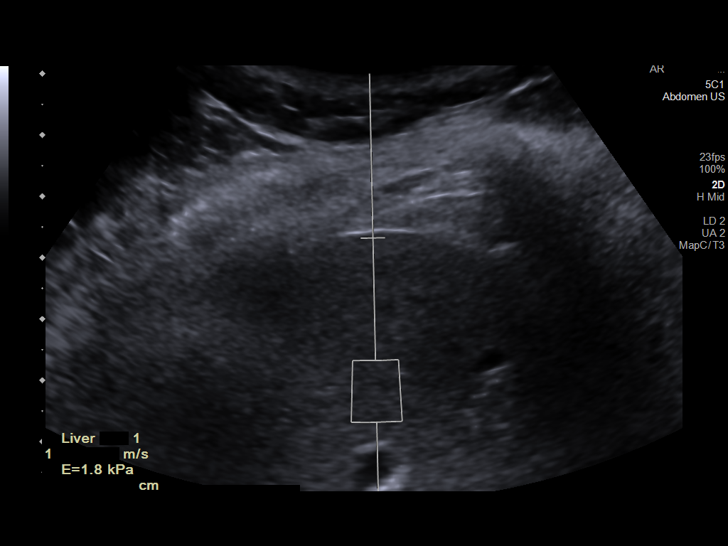
[im 8/12]
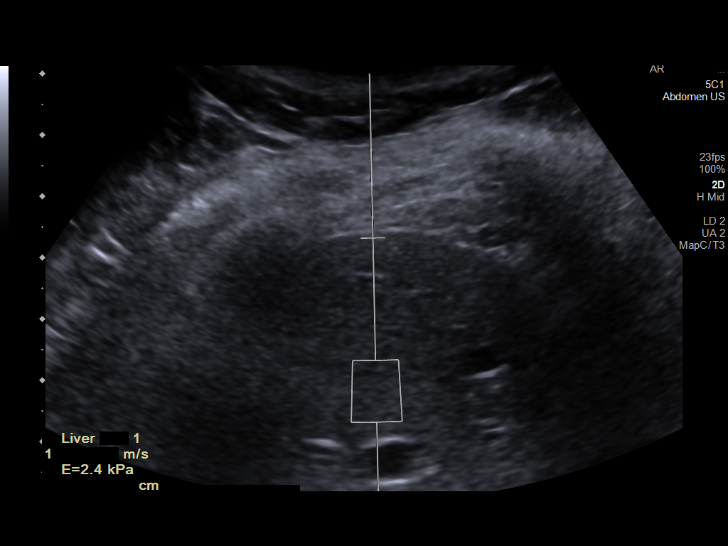
[im 9/12]
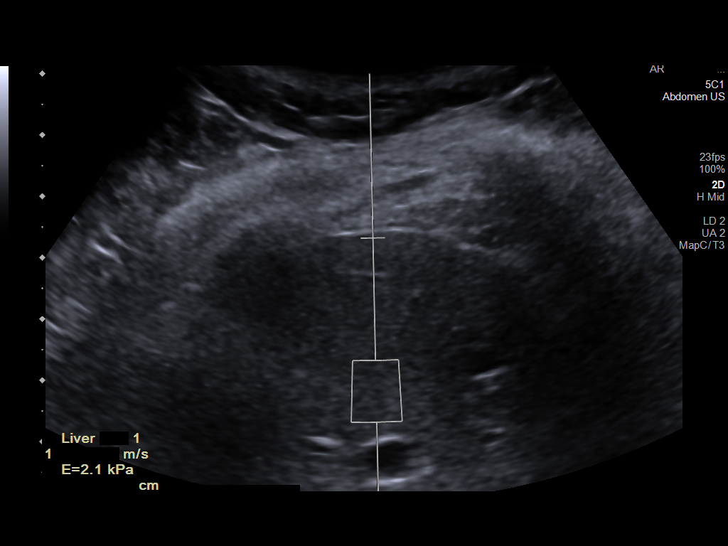
[im 10/12]
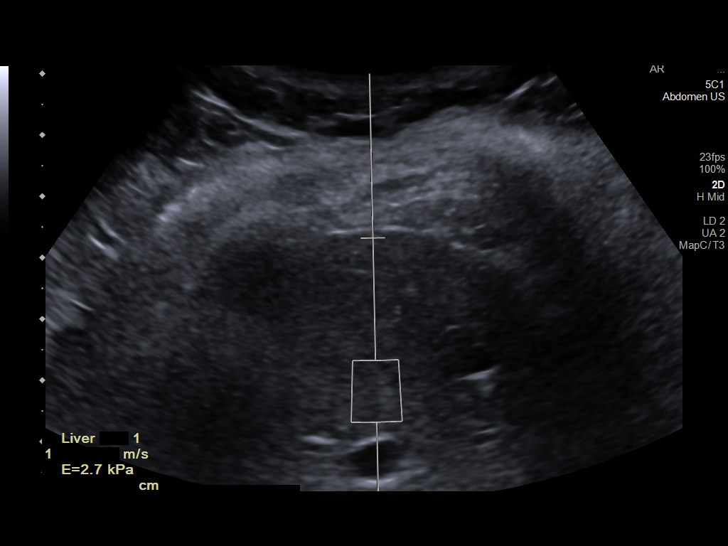
[im 11/12]
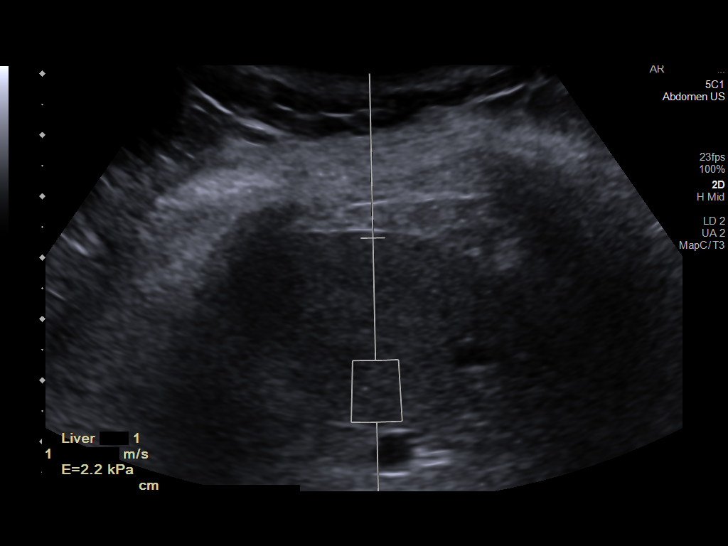
[im 12/12]
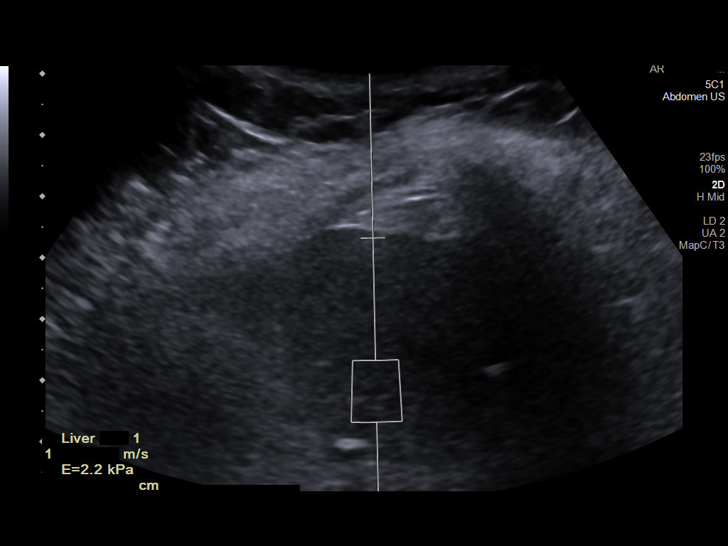

[12 of 12 positions shown; findings below may reference images not displayed]

FINDINGS: ULTRASOUND ABDOMEN LIMITED RIGHT UPPER QUADRANT

Gallbladder:

No gallstones or wall thickening visualized. No sonographic Murphy
sign noted.

Common bile duct:

Diameter: 2.9 mm

Liver:

No focal lesion identified. Within normal limits in parenchymal
echogenicity. Portal vein is patent on color Doppler imaging with
normal direction of blood flow towards the liver.

ULTRASOUND HEPATIC ELASTOGRAPHY

Device: Siemens Helix VTQ

Patient position: Supine

Transducer 5 C1

Number of measurements: 10

Hepatic segment:  VIII

Median kPa:

IQR:

IQR/Median kPa ratio:

Data quality:  Good

Diagnostic category:  < or = 5 kPa: high probability of being normal

The use of hepatic elastography is applicable to patients with viral
hepatitis and non-alcoholic fatty liver disease. At this time, there
is insufficient data for the referenced cut-off values and use in
other causes of liver disease, including alcoholic liver disease.
Patients, however, may be assessed by elastography and serve as
their own reference standard/baseline.

In patients with non-alcoholic liver disease, the values suggesting
compensated advanced chronic liver disease (cACLD) may be lower, and
patients may need additional testing with elasticity results of [DATE]
kPa.

Please note that abnormal hepatic elasticity and shear wave
velocities may also be identified in clinical settings other than
with hepatic fibrosis, such as: acute hepatitis, elevated right
heart and central venous pressures including use of beta blockers,
Praneeth disease ([place]), infiltrative processes such as
mastocytosis/amyloidosis/infiltrative tumor/lymphoma, extrahepatic
cholestasis, with hyperemia in the post-prandial state, and with
liver transplantation. Correlation with patient history, laboratory
data, and clinical condition recommended.

Diagnostic Categories:

< or =5 kPa: high probability of being normal

< or =9 kPa: in the absence of other known clinical signs, rules [DATE] kPa and ?13 kPa: suggestive of cACLD, but needs further testing

>13 kPa: highly suggestive of cACLD

> or =17 kPa: highly suggestive of cACLD with an increased
probability of clinically significant portal hypertension
IMPRESSION: ULTRASOUND RUQ:

Normal limited RIGHT upper quadrant ultrasound.

ULTRASOUND HEPATIC ELASTOGRAPHY:

Median kPa:

Diagnostic category:  < or = 5 kPa: high probability of being normal

## 2021-08-14 ENCOUNTER — Other Ambulatory Visit: Payer: Self-pay | Admitting: Family Medicine

## 2021-08-14 DIAGNOSIS — J42 Unspecified chronic bronchitis: Secondary | ICD-10-CM

## 2021-08-14 DIAGNOSIS — I10 Essential (primary) hypertension: Secondary | ICD-10-CM

## 2021-08-14 DIAGNOSIS — J4530 Mild persistent asthma, uncomplicated: Secondary | ICD-10-CM

## 2021-10-11 DIAGNOSIS — M5416 Radiculopathy, lumbar region: Secondary | ICD-10-CM | POA: Diagnosis not present

## 2021-10-25 DIAGNOSIS — M545 Low back pain, unspecified: Secondary | ICD-10-CM | POA: Diagnosis not present

## 2021-10-25 DIAGNOSIS — M25561 Pain in right knee: Secondary | ICD-10-CM | POA: Diagnosis not present

## 2021-10-29 ENCOUNTER — Other Ambulatory Visit: Payer: Self-pay | Admitting: Family Medicine

## 2021-11-02 ENCOUNTER — Telehealth (INDEPENDENT_AMBULATORY_CARE_PROVIDER_SITE_OTHER): Payer: BC Managed Care – PPO | Admitting: Nurse Practitioner

## 2021-11-02 DIAGNOSIS — W57XXXA Bitten or stung by nonvenomous insect and other nonvenomous arthropods, initial encounter: Secondary | ICD-10-CM | POA: Diagnosis not present

## 2021-11-02 DIAGNOSIS — S1096XA Insect bite of unspecified part of neck, initial encounter: Secondary | ICD-10-CM

## 2021-11-02 MED ORDER — DOXYCYCLINE HYCLATE 200 MG PO TBEC: 200.0000 mg | DELAYED_RELEASE_TABLET | Freq: Once | ORAL | 0 refills | Status: AC

## 2021-11-02 NOTE — Progress Notes (Signed)
? ?Virtual Visit  Note ?Due to COVID-19 pandemic this visit was conducted virtually. This visit type was conducted due to national recommendations for restrictions regarding the COVID-19 Pandemic (e.g. social distancing, sheltering in place) in an effort to limit this patient's exposure and mitigate transmission in our community. All issues noted in this document were discussed and addressed.  A physical exam was not performed with this format. ? ?I connected with Sarah Garrett on 11/03/21 at 4:30 pm  by telephone and verified that I am speaking with the correct person using two identifiers. Sarah Garrett is currently located at home during visit. The provider, Ivy Lynn, NP is located in their office at time of visit. ? ?I discussed the limitations, risks, security and privacy concerns of performing an evaluation and management service by telephone and the availability of in person appointments. I also discussed with the patient that there may be a patient responsible charge related to this service. The patient expressed understanding and agreed to proceed. ? ? ?History and Present Illness: ? ?HPI ?Patient is a 61 year old female who presents for tick bite over phone visit.  Patient denies flulike symptoms.  Reports redness, tenderness at bite site.  Right side neck. Tick completely removed from skin  ? ?Subjective:  ? ? ? ?ROS ?Review of Systems  ?Constitutional: Negative.  Negative for chills and fever.  ?HENT: Negative.    ?Eyes: Negative.  Negative for redness.  ?Respiratory: Negative.    ?Cardiovascular: Negative.   ?Musculoskeletal: Negative.   ?Skin:  Positive for rash.  ?Psychiatric/Behavioral: Negative.    ?All other systems reviewed and are negative. ? ?  ?Objective:  ?  ? ?LMP 11/29/2011  ?Wt Readings from Last 3 Encounters:  ?06/13/21 198 lb (89.8 kg)  ?12/14/20 192 lb 3.2 oz (87.2 kg)  ?10/04/20 191 lb 9.6 oz (86.9 kg)  ? ? ? ?Health Maintenance Due  ?Topic Date Due  ? Zoster Vaccines-  Shingrix (1 of 2) Never done  ? PAP SMEAR-Modifier  03/03/2018  ? COVID-19 Vaccine (4 - Booster for Moderna series) 11/02/2020  ? ? ?There are no preventive care reminders to display for this patient. ? ?Lab Results  ?Component Value Date  ? TSH 1.730 06/10/2021  ? ?Lab Results  ?Component Value Date  ? WBC 6.8 06/10/2021  ? HGB 14.6 06/10/2021  ? HCT 42.9 06/10/2021  ? MCV 91 06/10/2021  ? PLT 161 06/10/2021  ? ?Lab Results  ?Component Value Date  ? NA 142 06/10/2021  ? K 4.0 06/10/2021  ? CHLORIDE 102 07/19/2016  ? CO2 27 06/10/2021  ? GLUCOSE 92 06/10/2021  ? BUN 9 06/10/2021  ? CREATININE 0.73 06/10/2021  ? BILITOT 0.4 06/10/2021  ? ALKPHOS 79 06/10/2021  ? AST 21 06/10/2021  ? ALT 17 06/10/2021  ? PROT 6.3 06/10/2021  ? ALBUMIN 4.3 06/10/2021  ? CALCIUM 9.6 06/10/2021  ? ANIONGAP 6 03/26/2020  ? EGFR 93 06/10/2021  ? ?Lab Results  ?Component Value Date  ? CHOL 138 06/10/2021  ? ?Lab Results  ?Component Value Date  ? HDL 46 06/10/2021  ? ?Lab Results  ?Component Value Date  ? [place] 71 06/10/2021  ? ?Lab Results  ?Component Value Date  ? TRIG 115 06/10/2021  ? ?Lab Results  ?Component Value Date  ? CHOLHDL 2.7 12/10/2020  ? ?No results found for: HGBA1C ? ?  ?Assessment & Plan:  ?Take medication as prescribed ?Tylenol or ibuprofen for pain/fever ?Watch for flulike symptoms ?Follow-up with worsening  unresolved ?Problem List Items Addressed This Visit   ?None ?Visit Diagnoses   ? ? Tick bite of neck, initial encounter    -  Primary  ? Relevant Orders  ? Lyme Disease Serology w/Reflex  ? ?  ? ? ?Meds ordered this encounter  ?Medications  ? Doxycycline Hyclate 200 MG TBEC  ?  Sig: Take 200 mg by mouth once for 1 dose.  ?  Dispense:  1 tablet  ?  Refill:  0  ?  Order Specific Question:   Supervising Provider  ?  AnswerClaretta Fraise [[id number]]  ? ? ?Follow-up: No follow-ups on file.  ? ? ?Ivy Lynn, NP ? ?Observations/Objective: ?Televisit patient not in distress ? ?Assessment and Plan: ?Presents for tick  bite, in the past 24 hours.  Tenderness and erythema present ?Tylenol/ibuprofen for pain ?Lyme ABS lab for future ?Doxycycline prophylaxis 200 mg tablet by mouth once ? ? ?Follow Up Instructions: ?Follow-up for worsening or unresolved symptoms ? ?  ?I discussed the assessment and treatment plan with the patient. The patient was provided an opportunity to ask questions and all were answered. The patient agreed with the plan and demonstrated an understanding of the instructions. ?  ?The patient was advised to call back or seek an in-person evaluation if the symptoms worsen or if the condition fails to improve as anticipated. ? ?The above assessment and management plan was discussed with the patient. The patient verbalized understanding of and has agreed to the management plan. Patient is aware to call the clinic if symptoms persist or worsen. Patient is aware when to return to the clinic for a follow-up visit. Patient educated on when it is appropriate to go to the emergency department.  ? ?Time call ended:   ? ?I provided 12 minutes of  non face-to-face time during this encounter. ? ? ? ?Ivy Lynn, NP ?  ?

## 2021-11-02 NOTE — Patient Instructions (Signed)
Tick Bite Information, Adult  Ticks are insects that can bite. Most ticks live in shrubs and grassy areas. They climb onto people and animals that go by. Then they bite. Some ticks carrygerms that can make you sick. How can I prevent tick bites? Take these steps: Use insect repellent Use an insect repellent that has 20% or higher of the ingredients DEET, picaridin, or IR3535. Follow the instructions on the label. Put it on: Bare skin. The tops of your boots. Your pant legs. The ends of your sleeves. If you use an insect repellent that has the ingredient permethrin, follow the instructions on the label. Put it on: Clothing. Boots. Supplies or outdoor gear. Tents. When you are outside Wear long sleeves and long pants. Wear light-colored clothes. Tuck your pant legs into your socks. Stay in the middle of the trail. Do not touch the bushes. Avoid walking through long grass. Check for ticks on your clothes, hair, and skin often while you are outside. Before going inside your house, check your clothes, skin, head, neck, armpits, waist, groin, and joint areas. When you go indoors Check your clothes for ticks. Dry your clothes in a dryer on high heat for 10 minutes or more. If clothes are damp, additional time may be needed. Wash your clothes right away if they need to be washed. Use hot water. Check your pets and outdoor gear. Shower right away. Check your body for ticks. Do a full body check using a mirror. What is the right way to remove a tick? Remove the tick from your skin as soon as possible. Do not remove the tick with your bare fingers. To remove a tick that is crawling on your skin: Go outdoors and brush the tick off. Use tape or a lint roller. To remove a tick that is biting: Wash your hands. If you have latex gloves, put them on. Use tweezers, curved forceps, or a tick-removal tool to grasp the tick. Grasp the tick as close to your skin and as close to the tick's head as  possible. Gently pull up until the tick lets go. Try to keep the tick's head attached to its body. Do not twist or jerk the tick. Do not squeeze or crush the tick. Do not try to remove a tick with heat, alcohol, petroleum jelly, or fingernail polish. What should I do after taking out a tick? Throw away the tick. Do not crush a tick with your fingers. Clean the bite area and your hands with soap and water, rubbing alcohol, or an iodine wash. If an antiseptic cream or ointment is available, apply a small amount to the bite area. Wash and disinfect any instruments that you used to remove the tick. How should I get rid of a live tick? To dispose of a live tick, use one of these methods: Place the tick in rubbing alcohol. Place the tick in a bag or container you can close tightly. Wrap the tick tightly in tape. Flush the tick down the toilet. Contact a doctor if: You have symptoms, such as: A fever or chills. A red rash that makes a circle (bull's-eye rash) in the bite area. Redness and swelling where the tick bit you. Headache. Pain in a muscle, joint, or bone. Being more tired than normal. Trouble walking or moving your legs. Numbness in your legs. Tender and swollen lymph glands. A part of a tick breaks off and gets stuck in your skin. Get help right away if: You cannot remove a   tick. You cannot move (have paralysis) or feel weak. You are feeling worse or have new symptoms. You find a tick that is biting you and filled with blood. This is important if you are in an area where diseases from ticks are common. Summary Ticks may carry germs that can make you sick. To prevent tick bites wear long sleeves, long pants, and light colors. Use insect repellent. Follow the instructions on the label. If the tick is biting, do not try to remove it with heat, alcohol, petroleum jelly, or fingernail polish. Use tweezers, curved forceps, or a tick-removal tool to grasp the tick. Gently pull up  until the tick lets go. Do not twist or jerk the tick. Do not squeeze or crush the tick. If you have symptoms, contact a doctor. This information is not intended to replace advice given to you by your health care provider. Make sure you discuss any questions you have with your healthcare provider. Document Revised: 07/14/2019 Document Reviewed: 07/14/2019 Elsevier Patient Education  2022 [employer] Inc[employer]  

## 2021-11-03 ENCOUNTER — Encounter: Payer: Self-pay | Admitting: Nurse Practitioner

## 2021-11-16 ENCOUNTER — Other Ambulatory Visit: Payer: Self-pay | Admitting: Family Medicine

## 2021-11-16 ENCOUNTER — Other Ambulatory Visit (INDEPENDENT_AMBULATORY_CARE_PROVIDER_SITE_OTHER): Payer: BC Managed Care – PPO

## 2021-11-16 DIAGNOSIS — W57XXXA Bitten or stung by nonvenomous insect and other nonvenomous arthropods, initial encounter: Secondary | ICD-10-CM

## 2021-11-16 DIAGNOSIS — S1096XA Insect bite of unspecified part of neck, initial encounter: Secondary | ICD-10-CM | POA: Diagnosis not present

## 2021-11-17 LAB — LYME DISEASE SEROLOGY W/REFLEX: Lyme Total Antibody EIA: NEGATIVE

## 2022-01-06 ENCOUNTER — Other Ambulatory Visit: Payer: Self-pay | Admitting: Family Medicine

## 2022-01-06 DIAGNOSIS — E7800 Pure hypercholesterolemia, unspecified: Secondary | ICD-10-CM

## 2022-01-27 ENCOUNTER — Other Ambulatory Visit: Payer: Self-pay | Admitting: Family Medicine

## 2022-01-31 ENCOUNTER — Other Ambulatory Visit: Payer: Self-pay | Admitting: Family Medicine

## 2022-01-31 DIAGNOSIS — E7800 Pure hypercholesterolemia, unspecified: Secondary | ICD-10-CM

## 2022-02-10 ENCOUNTER — Other Ambulatory Visit: Payer: Self-pay | Admitting: Family Medicine

## 2022-03-08 ENCOUNTER — Other Ambulatory Visit: Payer: Self-pay | Admitting: Family Medicine

## 2022-03-08 DIAGNOSIS — I10 Essential (primary) hypertension: Secondary | ICD-10-CM

## 2022-03-08 DIAGNOSIS — J42 Unspecified chronic bronchitis: Secondary | ICD-10-CM

## 2022-03-08 DIAGNOSIS — J4530 Mild persistent asthma, uncomplicated: Secondary | ICD-10-CM

## 2022-03-09 ENCOUNTER — Other Ambulatory Visit: Payer: Self-pay | Admitting: Family Medicine

## 2022-03-09 DIAGNOSIS — E7800 Pure hypercholesterolemia, unspecified: Secondary | ICD-10-CM

## 2022-04-02 ENCOUNTER — Other Ambulatory Visit: Payer: Self-pay | Admitting: Family Medicine

## 2022-04-02 DIAGNOSIS — I10 Essential (primary) hypertension: Secondary | ICD-10-CM

## 2022-04-06 ENCOUNTER — Other Ambulatory Visit: Payer: Self-pay | Admitting: Family Medicine

## 2022-04-06 DIAGNOSIS — J4530 Mild persistent asthma, uncomplicated: Secondary | ICD-10-CM

## 2022-04-06 DIAGNOSIS — J42 Unspecified chronic bronchitis: Secondary | ICD-10-CM

## 2022-05-10 ENCOUNTER — Other Ambulatory Visit: Payer: Self-pay | Admitting: Family Medicine

## 2022-05-10 DIAGNOSIS — E7800 Pure hypercholesterolemia, unspecified: Secondary | ICD-10-CM

## 2022-05-10 DIAGNOSIS — I10 Essential (primary) hypertension: Secondary | ICD-10-CM

## 2022-05-10 DIAGNOSIS — J42 Unspecified chronic bronchitis: Secondary | ICD-10-CM

## 2022-05-10 DIAGNOSIS — J4530 Mild persistent asthma, uncomplicated: Secondary | ICD-10-CM

## 2022-05-11 DIAGNOSIS — L738 Other specified follicular disorders: Secondary | ICD-10-CM | POA: Diagnosis not present

## 2022-05-11 DIAGNOSIS — D2239 Melanocytic nevi of other parts of face: Secondary | ICD-10-CM | POA: Diagnosis not present

## 2022-05-11 DIAGNOSIS — L570 Actinic keratosis: Secondary | ICD-10-CM | POA: Diagnosis not present

## 2022-05-11 DIAGNOSIS — Z85820 Personal history of malignant melanoma of skin: Secondary | ICD-10-CM | POA: Diagnosis not present

## 2022-05-11 DIAGNOSIS — D2262 Melanocytic nevi of left upper limb, including shoulder: Secondary | ICD-10-CM | POA: Diagnosis not present

## 2022-05-17 ENCOUNTER — Other Ambulatory Visit: Payer: Self-pay | Admitting: Family Medicine

## 2022-05-17 DIAGNOSIS — Z1231 Encounter for screening mammogram for malignant neoplasm of breast: Secondary | ICD-10-CM

## 2022-05-29 ENCOUNTER — Telehealth: Payer: BC Managed Care – PPO | Admitting: Family Medicine

## 2022-05-29 DIAGNOSIS — L237 Allergic contact dermatitis due to plants, except food: Secondary | ICD-10-CM | POA: Diagnosis not present

## 2022-05-29 MED ORDER — PREDNISONE 10 MG (21) PO TBPK: ORAL_TABLET | ORAL | 0 refills | Status: DC

## 2022-05-29 MED ORDER — HYDROXYZINE PAMOATE 25 MG PO CAPS: 25.0000 mg | ORAL_CAPSULE | Freq: Three times a day (TID) | ORAL | 0 refills | Status: DC | PRN

## 2022-05-29 NOTE — Progress Notes (Signed)
MyChart Video visit  Subjective: [id number] PCP: Claretta Fraise, MD [id number] Sarah Garrett is a 61 y.o. female. Patient provides verbal consent for consult held via video.  Due to COVID-19 pandemic this visit was conducted virtually. This visit type was conducted due to national recommendations for restrictions regarding the COVID-19 Pandemic (e.g. social distancing, sheltering in place) in an effort to limit this patient's exposure and mitigate transmission in our community. All issues noted in this document were discussed and addressed.  A physical exam was not performed with this format.   Location of patient: home Location of provider: [facility] Others present for call: none  1. Poison oak dermatitis She got into some poison oak either Thursday/ Friday and developed a rash on her legs, hands and arms and now on her left eyelid and face.  She used dawn dish detergent to try and get it off.  She reports pruritis. Using calamine lotion and Zyrtec.    ROS: Per HPI  Allergies  Allergen Reactions   Aspirin Swelling    Swelling of mouth and throat   Ace Inhibitors Other (See Comments)    Per [id number]   Actos [Pioglitazone Hydrochloride] Other (See Comments)    Per pt: unknown   Alpha-Gal Other (See Comments)    unknown   Ciprofloxacin Other (See Comments)    Unknown reaction   Crestor [Rosuvastatin Calcium] Other (See Comments)    Per pt: unknown   Pneumococcal Vaccines Rash   Sulfonamide Derivatives Other (See Comments)    Swelling of mouth and throat   Past Medical History:  Diagnosis Date   Anemia    Diverticulosis    GERD (gastroesophageal reflux disease)    H/O adenoidectomy    Hemochromatosis 07/07/2011   Hyperlipidemia    Hypertension    Melanoma ([place])    right shoulder   Thyroid disease     Current Outpatient Medications:    ADVAIR DISKUS 100-50 MCG/ACT AEPB, INHALE 1 PUFF INTO THE LUNGS 2 (TWO) TIMES DAILY. (NEEDS TO BE SEEN BEFORE NEXT REFILL), Disp: 60 each,  Rfl: 0   albuterol (VENTOLIN HFA) 108 (90 Base) MCG/ACT inhaler, INHALE 2 PUFFS BY MOUTH EVERY 6 HOURS AS NEEDED, Disp: 18 each, Rfl: 5   cetirizine (ZYRTEC) 10 MG tablet, Take 1 tablet (10 mg total) by mouth at bedtime., Disp: 30 tablet, Rfl: 5   EPINEPHrine 0.3 mg/0.3 mL IJ SOAJ injection, Inject 0.3 mLs (0.3 mg total) into the muscle as needed for anaphylaxis., Disp: 1 each, Rfl: 3   esomeprazole (NEXIUM) 40 MG capsule, Take 1 capsule (40 mg total) by mouth as needed., Disp: 30 capsule, Rfl: 5   furosemide (LASIX) 40 MG tablet, TAKE 1 TABLET (40 MG TOTAL) BY MOUTH DAILY AS NEEDED. (NEEDS TO BE SEEN BEFORE NEXT REFILL), Disp: 30 tablet, Rfl: 0   hyoscyamine (LEVSIN SL) 0.125 MG SL tablet, Place 1 tablet (0.125 mg total) under the tongue as needed. Place 1-2 tablet under the tongue until it dissolves as needed, Disp: 30 tablet, Rfl: 5   icosapent Ethyl (VASCEPA) 1 g capsule, Take 1 capsule (1 g total) by mouth 2 (two) times daily., Disp: 180 capsule, Rfl: 3   levothyroxine (SYNTHROID) 125 MCG tablet, Take 1 tablet (125 mcg total) by mouth daily., Disp: 90 tablet, Rfl: 3   losartan-hydrochlorothiazide (HYZAAR) 100-12.5 MG tablet, Take 1 tablet by mouth daily. as directed, Disp: 90 tablet, Rfl: 3   montelukast (SINGULAIR) 10 MG tablet, TAKE 1 TABLET BY MOUTH EVERYDAY AT BEDTIME, Disp: 90  tablet, Rfl: 3   Multiple Vitamins-Minerals (MULTIVITAMIN WITH MINERALS) tablet, Take 1 tablet by mouth daily. One a day woman, Disp: , Rfl:    naproxen (NAPROSYN) 500 MG tablet, Take 500 mg by mouth daily as needed (Back pain)., Disp: , Rfl:    ondansetron (ZOFRAN) 4 MG tablet, Take 1 tablet (4 mg total) by mouth every 6 (six) hours as needed for nausea., Disp: 20 tablet, Rfl: 0   potassium chloride (KLOR-CON) 10 MEQ tablet, Take 1 tablet (10 mEq total) by mouth daily. (NEEDS TO BE SEEN BEFORE NEXT REFILL), Disp: 30 tablet, Rfl: 0   rosuvastatin (CRESTOR) 20 MG tablet, TAKE 1 TABLET BY MOUTH EVERY DAY (NEEDS TO BE  SEEN BEFORE NEXT REFILL), Disp: 30 tablet, Rfl: 0   Vitamin D, Ergocalciferol, (DRISDOL) 1.25 MG (50000 UNIT) CAPS capsule, TAKE 1 CAPSULE (50,000 UNITS TOTAL) BY MOUTH EVERY 7 (SEVEN) DAYS, Disp: 12 capsule, Rfl: 3  Gen: nontoxic appearing female/ NAD HEENT: Maculopapular rash noted along the left side of the jaw and the left upper lid.  Similar involvement in the left and right upper extremities but to a lesser degree  Assessment/ Plan: 61 y.o. female   Poison oak dermatitis - Plan: hydrOXYzine (VISTARIL) 25 MG capsule, predniSONE (STERAPRED UNI-PAK 21 TAB) 10 MG (21) TBPK tablet  Clinically consistent with widespread poison oak dermatitis.  Given involvement of face and multiple extremities will treat with oral corticosteroids.  This has been sent to pharmacy.  Vistaril sent for as needed use for breakthrough itching.  Discussed signs and symptoms of secondary bacterial infection and reasons for reevaluation.  She voiced good understanding will follow-up as needed  Start time: 11:42a End time: 11:49a  Total time spent on patient care (including video visit/ documentation): 7 minutes  [place], [place] [phone]

## 2022-06-01 ENCOUNTER — Other Ambulatory Visit: Payer: Self-pay | Admitting: Family Medicine

## 2022-06-01 DIAGNOSIS — E7800 Pure hypercholesterolemia, unspecified: Secondary | ICD-10-CM

## 2022-06-01 DIAGNOSIS — I10 Essential (primary) hypertension: Secondary | ICD-10-CM

## 2022-06-05 ENCOUNTER — Other Ambulatory Visit: Payer: Self-pay | Admitting: Family Medicine

## 2022-06-05 DIAGNOSIS — J4530 Mild persistent asthma, uncomplicated: Secondary | ICD-10-CM

## 2022-06-05 DIAGNOSIS — J42 Unspecified chronic bronchitis: Secondary | ICD-10-CM

## 2022-06-17 ENCOUNTER — Other Ambulatory Visit: Payer: Self-pay | Admitting: Family Medicine

## 2022-06-17 DIAGNOSIS — E7800 Pure hypercholesterolemia, unspecified: Secondary | ICD-10-CM

## 2022-06-17 DIAGNOSIS — E032 Hypothyroidism due to medicaments and other exogenous substances: Secondary | ICD-10-CM

## 2022-06-20 ENCOUNTER — Encounter: Payer: Self-pay | Admitting: Family Medicine

## 2022-06-20 ENCOUNTER — Ambulatory Visit: Payer: BC Managed Care – PPO | Admitting: Family Medicine

## 2022-06-20 VITALS — BP 129/75 | HR 86 | Temp 97.9°F | Ht 63.0 in | Wt 195.2 lb

## 2022-06-20 DIAGNOSIS — K219 Gastro-esophageal reflux disease without esophagitis: Secondary | ICD-10-CM

## 2022-06-20 DIAGNOSIS — J4530 Mild persistent asthma, uncomplicated: Secondary | ICD-10-CM

## 2022-06-20 DIAGNOSIS — I10 Essential (primary) hypertension: Secondary | ICD-10-CM

## 2022-06-20 DIAGNOSIS — E7800 Pure hypercholesterolemia, unspecified: Secondary | ICD-10-CM

## 2022-06-20 DIAGNOSIS — E039 Hypothyroidism, unspecified: Secondary | ICD-10-CM

## 2022-06-20 DIAGNOSIS — Z91018 Allergy to other foods: Secondary | ICD-10-CM

## 2022-06-20 DIAGNOSIS — E559 Vitamin D deficiency, unspecified: Secondary | ICD-10-CM | POA: Diagnosis not present

## 2022-06-20 DIAGNOSIS — J42 Unspecified chronic bronchitis: Secondary | ICD-10-CM

## 2022-06-20 MED ORDER — MONTELUKAST SODIUM 10 MG PO TABS: ORAL_TABLET | ORAL | 3 refills | Status: DC

## 2022-06-20 MED ORDER — FLUTICASONE-SALMETEROL 100-50 MCG/ACT IN AEPB: 1.0000 | INHALATION_SPRAY | Freq: Two times a day (BID) | RESPIRATORY_TRACT | 3 refills | Status: DC

## 2022-06-20 MED ORDER — ROSUVASTATIN CALCIUM 20 MG PO TABS: ORAL_TABLET | ORAL | 3 refills | Status: DC

## 2022-06-20 MED ORDER — LOSARTAN POTASSIUM-HCTZ 100-12.5 MG PO TABS: 1.0000 | ORAL_TABLET | Freq: Every day | ORAL | 3 refills | Status: DC

## 2022-06-20 MED ORDER — ICOSAPENT ETHYL 1 G PO CAPS: 1.0000 g | ORAL_CAPSULE | Freq: Two times a day (BID) | ORAL | 3 refills | Status: DC

## 2022-06-20 MED ORDER — ESOMEPRAZOLE MAGNESIUM 40 MG PO CPDR: 40.0000 mg | DELAYED_RELEASE_CAPSULE | ORAL | 3 refills | Status: DC | PRN

## 2022-06-20 MED ORDER — EPINEPHRINE 0.3 MG/0.3ML IJ SOAJ: 0.3000 mg | INTRAMUSCULAR | 3 refills | Status: DC | PRN

## 2022-06-20 NOTE — Progress Notes (Signed)
Subjective:  Patient ID: Sarah Garrett, female    DOB: 09/13/1960  Age: 61 y.o. MRN: [id number]  CC: Medical Management of Chronic Issues   HPI JEANETT ANTONOPOULOS presents for follow-up of elevated cholesterol. Doing well without complaints on current medication. Denies side effects of statin including myalgia and arthralgia and nausea. Also in today for liver function testing. Currently no chest pain, shortness of breath or other cardiovascular related symptoms noted.   follow-up on  thyroid. The patient has a history of hypothyroidism for many years. It has been stable recently. Pt. denies any change in  voice, loss of hair, heat or cold intolerance. Energy level has been adequate to good. Patient denies constipation and diarrhea. No myxedema. Medication is as noted below. Verified that pt is taking it daily on an empty stomach. Well tolerated.  Patient in for follow-up of GERD. Currently asymptomatic taking  PPI daily. There is no chest pain or heartburn. No hematemesis and no melena. No dysphagia or choking. Onset is remote. Progression is stable. Complicating factors, none.  For last few months a couple of mild asthma attacks that she took care of with albuterol rescue.   History Arielle has a past medical history of Anemia, Diverticulosis, GERD (gastroesophageal reflux disease), H/O adenoidectomy, Hemochromatosis (07/07/2011), Hyperlipidemia, Hypertension, Melanoma ([place]), and Thyroid disease.   She has a past surgical history that includes Tonsillectomy and adenoidectomy; Breast biopsy (07/31/2002); Melanoma excision (09/28/2011); Colonoscopy with propofol (N/A, 06/29/2020); and Knee arthroscopy w/ meniscal repair (Right).   Her family history includes Heart disease in her mother.She reports that she quit smoking about 44 years ago. Her smoking use included cigarettes. She started smoking about 47 years ago. She has a 0.75 pack-year smoking history. She has never used smokeless  tobacco. She reports that she does not drink alcohol and does not use drugs.  Current Outpatient Medications on File Prior to Visit  Medication Sig Dispense Refill   albuterol (VENTOLIN HFA) 108 (90 Base) MCG/ACT inhaler INHALE 2 PUFFS BY MOUTH EVERY 6 HOURS AS NEEDED 18 each 5   cetirizine (ZYRTEC) 10 MG tablet Take 1 tablet (10 mg total) by mouth at bedtime. 30 tablet 5   furosemide (LASIX) 40 MG tablet TAKE 1 TABLET (40 MG TOTAL) BY MOUTH DAILY AS NEEDED. (NEEDS TO BE SEEN BEFORE NEXT REFILL) 90 tablet 1   hydrOXYzine (VISTARIL) 25 MG capsule Take 1 capsule (25 mg total) by mouth every 8 (eight) hours as needed for itching. 30 capsule 0   hyoscyamine (LEVSIN SL) 0.125 MG SL tablet Place 1 tablet (0.125 mg total) under the tongue as needed. Place 1-2 tablet under the tongue until it dissolves as needed 30 tablet 5   levothyroxine (SYNTHROID) 125 MCG tablet TAKE 1 TABLET BY MOUTH EVERY DAY 90 tablet 0   Multiple Vitamins-Minerals (MULTIVITAMIN WITH MINERALS) tablet Take 1 tablet by mouth daily. One a day woman     naproxen (NAPROSYN) 500 MG tablet Take 500 mg by mouth daily as needed (Back pain).     ondansetron (ZOFRAN) 4 MG tablet Take 1 tablet (4 mg total) by mouth every 6 (six) hours as needed for nausea. 20 tablet 0   potassium chloride (KLOR-CON) 10 MEQ tablet Take 1 tablet (10 mEq total) by mouth daily. (NEEDS TO BE SEEN BEFORE NEXT REFILL) 30 tablet 0   predniSONE (STERAPRED UNI-PAK 21 TAB) 10 MG (21) TBPK tablet As directed x 6 days 21 tablet 0   Vitamin D, Ergocalciferol, (DRISDOL) 1.25 MG (  50000 UNIT) CAPS capsule TAKE 1 CAPSULE (50,000 UNITS TOTAL) BY MOUTH EVERY 7 (SEVEN) DAYS 12 capsule 3   No current facility-administered medications on file prior to visit.    ROS Review of Systems  Constitutional: Negative.   HENT: Negative.    Eyes:  Negative for visual disturbance.  Respiratory:  Negative for shortness of breath.   Cardiovascular:  Negative for chest pain.   Gastrointestinal:  Negative for abdominal pain.  Musculoskeletal:  Negative for arthralgias.    Objective:  BP 129/75   Pulse 86   Temp 97.9 F (36.6 C)   Ht _0  (1.6 m)   Wt 195 lb 3.2 oz (88.5 kg)   LMP 11/29/2011   SpO2 94%   BMI 34.58 kg/m   BP Readings from Last 3 Encounters:  06/20/22 129/75  06/13/21 137/80  12/14/20 113/73    Wt Readings from Last 3 Encounters:  06/20/22 195 lb 3.2 oz (88.5 kg)  06/13/21 198 lb (89.8 kg)  12/14/20 192 lb 3.2 oz (87.2 kg)     Physical Exam Constitutional:      General: She is not in acute distress.    Appearance: She is well-developed.  HENT:     Head: Normocephalic and atraumatic.  Eyes:     Conjunctiva/sclera: Conjunctivae normal.     Pupils: Pupils are equal, round, and reactive to light.  Neck:     Thyroid: No thyromegaly.  Cardiovascular:     Rate and Rhythm: Normal rate and regular rhythm.     Heart sounds: Normal heart sounds. No murmur heard. Pulmonary:     Effort: Pulmonary effort is normal. No respiratory distress.     Breath sounds: Normal breath sounds. No wheezing or rales.  Abdominal:     General: Bowel sounds are normal. There is no distension.     Palpations: Abdomen is soft.     Tenderness: There is no abdominal tenderness.  Musculoskeletal:        General: Normal range of motion.     Cervical back: Normal range of motion and neck supple.  Lymphadenopathy:     Cervical: No cervical adenopathy.  Skin:    General: Skin is warm and dry.  Neurological:     Mental Status: She is alert and oriented to person, place, and time.  Psychiatric:        Behavior: Behavior normal.        Thought Content: Thought content normal.        Judgment: Judgment normal.     No results found for: "HGBA1C"  Lab Results  Component Value Date   WBC 6.8 06/10/2021   HGB 14.6 06/10/2021   HCT 42.9 06/10/2021   PLT 161 06/10/2021   GLUCOSE 92 06/10/2021   CHOL 138 06/10/2021   TRIG 115 06/10/2021   HDL 46  06/10/2021   LDLCALC 71 06/10/2021   ALT 17 06/10/2021   AST 21 06/10/2021   NA 142 06/10/2021   K 4.0 06/10/2021   CL 104 06/10/2021   CREATININE 0.73 06/10/2021   BUN 9 06/10/2021   CO2 27 06/10/2021   TSH 1.730 06/10/2021   MICROALBUR 0.50 10/24/2012    MM DIAG BREAST TOMO UNI RIGHT  Result Date: 03/04/2021 CLINICAL DATA:  61 year old female for further evaluation of possible RIGHT breast mass on screening mammogram. EXAM: DIGITAL DIAGNOSTIC UNILATERAL RIGHT MAMMOGRAM WITH TOMOSYNTHESIS AND CAD; ULTRASOUND RIGHT BREAST LIMITED TECHNIQUE: Right digital diagnostic mammography and breast tomosynthesis was performed. The images were evaluated with  computer-aided detection.; Targeted ultrasound examination of the right breast was performed COMPARISON:  Previous exam(s). ACR Breast Density Category b: There are scattered areas of fibroglandular density. FINDINGS: Full field views of the RIGHT breast demonstrate a persistent circumscribed oval mass within the UPPER RIGHT breast. Targeted ultrasound is performed, showing several benign cysts within the UPPER RIGHT breast. A 0.3 x 0.4 cm benign cyst at the 12 o'clock of the RIGHT breast 2 cm from the nipple corresponds to the screening study finding. IMPRESSION: Benign cyst within the UPPER RIGHT breast corresponding to the screening study finding. RECOMMENDATION: Bilateral screening mammogram in 1 year. I have discussed the findings and recommendations with the patient. If applicable, a reminder letter will be sent to the patient regarding the next appointment. BI-RADS CATEGORY  2: Benign. Electronically Signed   By: Margarette Canada M.D.   On: 03/04/2021 16:08  US BREAST LTD UNI RIGHT INC AXILLA  Result Date: 03/04/2021 CLINICAL DATA:  61 year old female for further evaluation of possible RIGHT breast mass on screening mammogram. EXAM: DIGITAL DIAGNOSTIC UNILATERAL RIGHT MAMMOGRAM WITH TOMOSYNTHESIS AND CAD; ULTRASOUND RIGHT BREAST LIMITED TECHNIQUE: Right  digital diagnostic mammography and breast tomosynthesis was performed. The images were evaluated with computer-aided detection.; Targeted ultrasound examination of the right breast was performed COMPARISON:  Previous exam(s). ACR Breast Density Category b: There are scattered areas of fibroglandular density. FINDINGS: Full field views of the RIGHT breast demonstrate a persistent circumscribed oval mass within the UPPER RIGHT breast. Targeted ultrasound is performed, showing several benign cysts within the UPPER RIGHT breast. A 0.3 x 0.4 cm benign cyst at the 12 o'clock of the RIGHT breast 2 cm from the nipple corresponds to the screening study finding. IMPRESSION: Benign cyst within the UPPER RIGHT breast corresponding to the screening study finding. RECOMMENDATION: Bilateral screening mammogram in 1 year. I have discussed the findings and recommendations with the patient. If applicable, a reminder letter will be sent to the patient regarding the next appointment. BI-RADS CATEGORY  2: Benign. Electronically Signed   By: Margarette Canada M.D.   On: 03/04/2021 16:08   Assessment & Plan:   Aimi was seen today for medical management of chronic issues.  Diagnoses and all orders for this visit:  Essential hypertension -     CBC with Differential/Platelet -     CMP14+EGFR -     losartan-hydrochlorothiazide (HYZAAR) 100-12.5 MG tablet; Take 1 tablet by mouth daily. as directed  Acquired hypothyroidism -     TSH + free T4  Pure hypercholesterolemia -     Lipid panel -     icosapent Ethyl (VASCEPA) 1 g capsule; Take 1 capsule (1 g total) by mouth 2 (two) times daily. -     rosuvastatin (CRESTOR) 20 MG tablet; TAKE 1 TABLET BY MOUTH EVERY DAY  Vitamin D deficiency -     VITAMIN D 25 Hydroxy (Vit-D Deficiency, Fractures)  Gastroesophageal reflux disease, unspecified whether esophagitis present -     esomeprazole (NEXIUM) 40 MG capsule; Take 1 capsule (40 mg total) by mouth as needed.  Mild persistent  asthma without complication -     fluticasone-salmeterol (ADVAIR DISKUS) 100-50 MCG/ACT AEPB; Inhale 1 puff into the lungs 2 (two) times daily. -     montelukast (SINGULAIR) 10 MG tablet; TAKE 1 TABLET BY MOUTH EVERYDAY AT BEDTIME  Chronic bronchitis, unspecified chronic bronchitis type (HCC) -     fluticasone-salmeterol (ADVAIR DISKUS) 100-50 MCG/ACT AEPB; Inhale 1 puff into the lungs 2 (two) times daily.  Allergy to alpha-gal -     EPINEPHrine 0.3 mg/0.3 mL IJ SOAJ injection; Inject 0.3 mg into the muscle as needed for anaphylaxis.   I have changed Romie Levee. Moise's Vascepa to icosapent Ethyl. I have also changed her rosuvastatin and EPINEPHrine. I am also having her maintain her ondansetron, naproxen, multivitamin with minerals, hyoscyamine, cetirizine, albuterol, Vitamin D (Ergocalciferol), potassium chloride, hydrOXYzine, predniSONE, furosemide, levothyroxine, esomeprazole, fluticasone-salmeterol, losartan-hydrochlorothiazide, and montelukast.  Meds ordered this encounter  Medications   esomeprazole (NEXIUM) 40 MG capsule    Sig: Take 1 capsule (40 mg total) by mouth as needed.    Dispense:  90 capsule    Refill:  3   fluticasone-salmeterol (ADVAIR DISKUS) 100-50 MCG/ACT AEPB    Sig: Inhale 1 puff into the lungs 2 (two) times daily.    Dispense:  180 each    Refill:  3   icosapent Ethyl (VASCEPA) 1 g capsule    Sig: Take 1 capsule (1 g total) by mouth 2 (two) times daily.    Dispense:  180 capsule    Refill:  3   losartan-hydrochlorothiazide (HYZAAR) 100-12.5 MG tablet    Sig: Take 1 tablet by mouth daily. as directed    Dispense:  90 tablet    Refill:  3   rosuvastatin (CRESTOR) 20 MG tablet    Sig: TAKE 1 TABLET BY MOUTH EVERY DAY    Dispense:  90 tablet    Refill:  3   EPINEPHrine 0.3 mg/0.3 mL IJ SOAJ injection    Sig: Inject 0.3 mg into the muscle as needed for anaphylaxis.    Dispense:  1 each    Refill:  3   montelukast (SINGULAIR) 10 MG tablet    Sig: TAKE 1  TABLET BY MOUTH EVERYDAY AT BEDTIME    Dispense:  90 tablet    Refill:  3     Follow-up: Return in about 1 year (around 06/21/2023).  Claretta Fraise, M.D.

## 2022-06-21 LAB — CBC WITH DIFFERENTIAL/PLATELET
Basophils Absolute: 0.1 10*3/uL (ref 0.0–0.2)
Basos: 1 %
EOS (ABSOLUTE): 0.5 10*3/uL — ABNORMAL HIGH (ref 0.0–0.4)
Eos: 5 %
Hematocrit: 45 % (ref 34.0–46.6)
Hemoglobin: 15.6 g/dL (ref 11.1–15.9)
Immature Grans (Abs): 0 10*3/uL (ref 0.0–0.1)
Immature Granulocytes: 0 %
Lymphocytes Absolute: 3 10*3/uL (ref 0.7–3.1)
Lymphs: 34 %
MCH: 31.5 pg (ref 26.6–33.0)
MCHC: 34.7 g/dL (ref 31.5–35.7)
MCV: 91 fL (ref 79–97)
Monocytes Absolute: 0.4 10*3/uL (ref 0.1–0.9)
Monocytes: 5 %
Neutrophils Absolute: 5 10*3/uL (ref 1.4–7.0)
Neutrophils: 55 %
Platelets: 178 10*3/uL (ref 150–450)
RBC: 4.95 x10E6/uL (ref 3.77–5.28)
RDW: 13.7 % (ref 11.7–15.4)
WBC: 8.9 10*3/uL (ref 3.4–10.8)

## 2022-06-21 LAB — CMP14+EGFR
ALT: 18 IU/L (ref 0–32)
AST: 21 IU/L (ref 0–40)
Albumin/Globulin Ratio: 1.8 (ref 1.2–2.2)
Albumin: 4.4 g/dL (ref 3.9–4.9)
Alkaline Phosphatase: 90 IU/L (ref 44–121)
BUN/Creatinine Ratio: 20 (ref 12–28)
BUN: 15 mg/dL (ref 8–27)
Bilirubin Total: 0.4 mg/dL (ref 0.0–1.2)
CO2: 28 mmol/L (ref 20–29)
Calcium: 10 mg/dL (ref 8.7–10.3)
Chloride: 99 mmol/L (ref 96–106)
Creatinine, Ser: 0.75 mg/dL (ref 0.57–1.00)
Globulin, Total: 2.5 g/dL (ref 1.5–4.5)
Glucose: 112 mg/dL — ABNORMAL HIGH (ref 70–99)
Potassium: 3.2 mmol/L — ABNORMAL LOW (ref 3.5–5.2)
Sodium: 143 mmol/L (ref 134–144)
Total Protein: 6.9 g/dL (ref 6.0–8.5)
eGFR: 89 mL/min/{1.73_m2} (ref >59)

## 2022-06-21 LAB — LIPID PANEL
Chol/HDL Ratio: 4.1 ratio (ref 0.0–4.4)
Cholesterol, Total: 169 mg/dL (ref 100–199)
HDL: 41 mg/dL (ref >39)
LDL Chol Calc (NIH): 67 mg/dL (ref 0–99)
Triglycerides: 389 mg/dL — ABNORMAL HIGH (ref 0–149)
VLDL Cholesterol Cal: 61 mg/dL — ABNORMAL HIGH (ref 5–40)

## 2022-06-21 LAB — TSH+FREE T4
Free T4: 1.39 ng/dL (ref 0.82–1.77)
TSH: 6.13 u[IU]/mL — ABNORMAL HIGH (ref 0.450–4.500)

## 2022-06-21 LAB — VITAMIN D 25 HYDROXY (VIT D DEFICIENCY, FRACTURES): Vit D, 25-Hydroxy: 46.1 ng/mL (ref 30.0–100.0)

## 2022-06-29 ENCOUNTER — Telehealth: Payer: Self-pay | Admitting: *Deleted

## 2022-06-29 NOTE — Telephone Encounter (Signed)
Sarah Garrett (Key: [id number]) Vascepa 1GM capsules  Status: PA Response - Approved  Effective from 06/29/2022 through 06/28/2023.

## 2022-07-06 ENCOUNTER — Ambulatory Visit
Admission: RE | Admit: 2022-07-06 | Discharge: 2022-07-06 | Disposition: A | Discharge status: Home / Self Care | Payer: BC Managed Care – PPO | Source: Ambulatory Visit | Attending: Family Medicine | Admitting: Family Medicine

## 2022-07-06 DIAGNOSIS — Z1231 Encounter for screening mammogram for malignant neoplasm of breast: Secondary | ICD-10-CM | POA: Diagnosis not present

## 2022-08-29 ENCOUNTER — Telehealth: Payer: Self-pay | Admitting: Family Medicine

## 2022-08-29 ENCOUNTER — Encounter: Payer: Self-pay | Admitting: Family Medicine

## 2022-08-29 ENCOUNTER — Telehealth (INDEPENDENT_AMBULATORY_CARE_PROVIDER_SITE_OTHER): Payer: BC Managed Care – PPO | Admitting: Family Medicine

## 2022-08-29 DIAGNOSIS — J208 Acute bronchitis due to other specified organisms: Secondary | ICD-10-CM | POA: Diagnosis not present

## 2022-08-29 DIAGNOSIS — J4530 Mild persistent asthma, uncomplicated: Secondary | ICD-10-CM

## 2022-08-29 DIAGNOSIS — B9689 Other specified bacterial agents as the cause of diseases classified elsewhere: Secondary | ICD-10-CM

## 2022-08-29 MED ORDER — FLUTICASONE-SALMETEROL 250-50 MCG/ACT IN AEPB: 1.0000 | INHALATION_SPRAY | Freq: Two times a day (BID) | RESPIRATORY_TRACT | 3 refills | Status: DC

## 2022-08-29 MED ORDER — CEFDINIR 300 MG PO CAPS: 300.0000 mg | ORAL_CAPSULE | Freq: Two times a day (BID) | ORAL | 0 refills | Status: DC

## 2022-08-29 NOTE — Progress Notes (Signed)
MyChart Video visit  Subjective: CC:URI PCP: Claretta Fraise, MD EPP:IRJJOAC Sarah Garrett is a 62 y.o. female. Patient provides verbal consent for consult held via video.  Due to COVID-19 pandemic this visit was conducted virtually. This visit type was conducted due to national recommendations for restrictions regarding the COVID-19 Pandemic (e.g. social distancing, sheltering in place) in an effort to limit this patient's exposure and mitigate transmission in our community. All issues noted in this document were discussed and addressed.  A physical exam was not performed with this format.   Location of patient: home Location of provider: [facility] Others present for call: none  1. URI Patient reports that on Friday, she started having head congestion and cough.  No fever.  She reports some fatigue. She has been taking OTC meds. Has a history of asthma and treated with generic Advair.  She has history of hospitalization or intubation for asthma exacerbation in the past.  Cough is not productive.  No purulence from nares.  Has not tested for COVID.  No known exposures   ROS: Per HPI  Allergies  Allergen Reactions   Aspirin Swelling    Swelling of mouth and throat   Ace Inhibitors Other (See Comments)    Per [id number]   Actos [Pioglitazone Hydrochloride] Other (See Comments)    Per pt: unknown   Alpha-Gal Other (See Comments)    unknown   Ciprofloxacin Other (See Comments)    Unknown reaction   Crestor [Rosuvastatin Calcium] Other (See Comments)    Per pt: unknown   Pneumococcal Vaccines Rash   Sulfonamide Derivatives Other (See Comments)    Swelling of mouth and throat   Past Medical History:  Diagnosis Date   Anemia    Diverticulosis    GERD (gastroesophageal reflux disease)    H/O adenoidectomy    Hemochromatosis 07/07/2011   Hyperlipidemia    Hypertension    Melanoma ([place])    right shoulder   Thyroid disease     Current Outpatient Medications:    albuterol (VENTOLIN HFA)  108 (90 Base) MCG/ACT inhaler, INHALE 2 PUFFS BY MOUTH EVERY 6 HOURS AS NEEDED, Disp: 18 each, Rfl: 5   cetirizine (ZYRTEC) 10 MG tablet, Take 1 tablet (10 mg total) by mouth at bedtime., Disp: 30 tablet, Rfl: 5   EPINEPHrine 0.3 mg/0.3 mL IJ SOAJ injection, Inject 0.3 mg into the muscle as needed for anaphylaxis., Disp: 1 each, Rfl: 3   esomeprazole (NEXIUM) 40 MG capsule, Take 1 capsule (40 mg total) by mouth as needed., Disp: 90 capsule, Rfl: 3   fluticasone-salmeterol (ADVAIR DISKUS) 100-50 MCG/ACT AEPB, Inhale 1 puff into the lungs 2 (two) times daily., Disp: 180 each, Rfl: 3   furosemide (LASIX) 40 MG tablet, TAKE 1 TABLET (40 MG TOTAL) BY MOUTH DAILY AS NEEDED. (NEEDS TO BE SEEN BEFORE NEXT REFILL), Disp: 90 tablet, Rfl: 1   hydrOXYzine (VISTARIL) 25 MG capsule, Take 1 capsule (25 mg total) by mouth every 8 (eight) hours as needed for itching., Disp: 30 capsule, Rfl: 0   hyoscyamine (LEVSIN SL) 0.125 MG SL tablet, Place 1 tablet (0.125 mg total) under the tongue as needed. Place 1-2 tablet under the tongue until it dissolves as needed, Disp: 30 tablet, Rfl: 5   icosapent Ethyl (VASCEPA) 1 g capsule, Take 1 capsule (1 g total) by mouth 2 (two) times daily., Disp: 180 capsule, Rfl: 3   levothyroxine (SYNTHROID) 125 MCG tablet, TAKE 1 TABLET BY MOUTH EVERY DAY, Disp: 90 tablet, Rfl: 0  losartan-hydrochlorothiazide (HYZAAR) 100-12.5 MG tablet, Take 1 tablet by mouth daily. as directed, Disp: 90 tablet, Rfl: 3   montelukast (SINGULAIR) 10 MG tablet, TAKE 1 TABLET BY MOUTH EVERYDAY AT BEDTIME, Disp: 90 tablet, Rfl: 3   Multiple Vitamins-Minerals (MULTIVITAMIN WITH MINERALS) tablet, Take 1 tablet by mouth daily. One a day woman, Disp: , Rfl:    naproxen (NAPROSYN) 500 MG tablet, Take 500 mg by mouth daily as needed (Back pain)., Disp: , Rfl:    ondansetron (ZOFRAN) 4 MG tablet, Take 1 tablet (4 mg total) by mouth every 6 (six) hours as needed for nausea., Disp: 20 tablet, Rfl: 0   potassium chloride  (KLOR-CON) 10 MEQ tablet, Take 1 tablet (10 mEq total) by mouth daily. (NEEDS TO BE SEEN BEFORE NEXT REFILL), Disp: 30 tablet, Rfl: 0   predniSONE (STERAPRED UNI-PAK 21 TAB) 10 MG (21) TBPK tablet, As directed x 6 days, Disp: 21 tablet, Rfl: 0   rosuvastatin (CRESTOR) 20 MG tablet, TAKE 1 TABLET BY MOUTH EVERY DAY, Disp: 90 tablet, Rfl: 3   Vitamin D, Ergocalciferol, (DRISDOL) 1.25 MG (50000 UNIT) CAPS capsule, TAKE 1 CAPSULE (50,000 UNITS TOTAL) BY MOUTH EVERY 7 (SEVEN) DAYS, Disp: 12 capsule, Rfl: 3  Gen: Nontoxic-appearing female no acute distress Pulm: Normal work of breathing on room air.  No appreciable wheezing or dyspnea with speech  Assessment/ Plan: 62 y.o. female   Acute bacterial bronchitis - Plan: cefdinir (OMNICEF) 300 MG capsule  Mild persistent asthma without complication - Plan: fluticasone-salmeterol (ADVAIR DISKUS) 250-50 MCG/ACT AEPB  I given a pocket prescription for possible early acute bacterial bronchitis.  However, I did think that perhaps she may be just needs a higher dose of her Advair.  I advance her 250 mcg inhaled twice daily.  We discussed red flag signs and symptoms warranting further evaluation and reasons to initiate the antibiotic.  She will COVID test at home and contact me back with results  Start time: 11:33am End time: 11:41a  Total time spent on patient care (including video visit/ documentation): 8 minutes  [place], [place] [phone]

## 2022-08-29 NOTE — Telephone Encounter (Signed)
Patient calling to let Dr Darnell Level know that she took an at home COVID test and it was negative

## 2022-08-29 NOTE — Telephone Encounter (Signed)
Wonderful!

## 2022-09-05 ENCOUNTER — Other Ambulatory Visit: Payer: Self-pay | Admitting: Family Medicine

## 2022-09-05 DIAGNOSIS — E559 Vitamin D deficiency, unspecified: Secondary | ICD-10-CM

## 2022-10-03 ENCOUNTER — Other Ambulatory Visit: Payer: Self-pay | Admitting: Family Medicine

## 2022-10-03 DIAGNOSIS — I10 Essential (primary) hypertension: Secondary | ICD-10-CM

## 2022-10-03 DIAGNOSIS — E559 Vitamin D deficiency, unspecified: Secondary | ICD-10-CM

## 2022-10-03 DIAGNOSIS — E032 Hypothyroidism due to medicaments and other exogenous substances: Secondary | ICD-10-CM

## 2022-11-07 ENCOUNTER — Telehealth (INDEPENDENT_AMBULATORY_CARE_PROVIDER_SITE_OTHER): Payer: BC Managed Care – PPO | Admitting: Family Medicine

## 2022-11-07 ENCOUNTER — Encounter: Payer: Self-pay | Admitting: Family Medicine

## 2022-11-07 DIAGNOSIS — J208 Acute bronchitis due to other specified organisms: Secondary | ICD-10-CM | POA: Diagnosis not present

## 2022-11-07 DIAGNOSIS — J4531 Mild persistent asthma with (acute) exacerbation: Secondary | ICD-10-CM

## 2022-11-07 DIAGNOSIS — B9689 Other specified bacterial agents as the cause of diseases classified elsewhere: Secondary | ICD-10-CM

## 2022-11-07 MED ORDER — BENZONATATE 100 MG PO CAPS
100.0000 mg | ORAL_CAPSULE | Freq: Three times a day (TID) | ORAL | 0 refills | Status: DC | PRN
Start: 2022-11-07 — End: 2022-12-19

## 2022-11-07 MED ORDER — PREDNISONE 10 MG (21) PO TBPK
ORAL_TABLET | ORAL | 0 refills | Status: DC
Start: 2022-11-07 — End: 2022-11-16

## 2022-11-07 MED ORDER — CEFDINIR 300 MG PO CAPS: 300.0000 mg | ORAL_CAPSULE | Freq: Two times a day (BID) | ORAL | 0 refills | Status: DC

## 2022-11-07 NOTE — Progress Notes (Signed)
MyChart Video visit  Subjective: CM:KLKJZ PCP: Mechele Claude, MD PHX:[id number] ELECTA VOIROL is a 62 y.o. female. Patient provides verbal consent for consult held via video.  Due to COVID-19 pandemic this visit was conducted virtually. This visit type was conducted due to national recommendations for restrictions regarding the COVID-19 Pandemic (e.g. social distancing, sheltering in place) in an effort to limit this patient's exposure and mitigate transmission in our community. All issues noted in this document were discussed and addressed.  A physical exam was not performed with this format.   Location of patient: home Location of provider: [facility] Others present for call: none  1. Cough Deep cough onset Friday.  Occasional wheezing.  Has been using her Advair, which was increased in dose in January.  Using mucinex, dayquil.  Not much improvement with this.  Symptoms seem to onset after pollen exposure. No known exposures to flu, covid, etc.  No fevers, myalgia.  Sputum does have some brownish discoloration.  Of note her last treatment for same was back in January.  She received both antibiotics and steroid Dosepak.  She was previously under the care of asthma and allergy but he has retired and she has not seen a specialist for years  ROS: Per HPI  Allergies  Allergen Reactions   Aspirin Swelling    Swelling of mouth and throat   Ace Inhibitors Other (See Comments)    Per [id number]   Actos [Pioglitazone Hydrochloride] Other (See Comments)    Per pt: unknown   Alpha-Gal Other (See Comments)    unknown   Ciprofloxacin Other (See Comments)    Unknown reaction   Crestor [Rosuvastatin Calcium] Other (See Comments)    Per pt: unknown   Pneumococcal Vaccines Rash   Sulfonamide Derivatives Other (See Comments)    Swelling of mouth and throat   Past Medical History:  Diagnosis Date   Anemia    Diverticulosis    GERD (gastroesophageal reflux disease)    H/O adenoidectomy    Hemochromatosis  07/07/2011   Hyperlipidemia    Hypertension    Melanoma (HCC)    right shoulder   Thyroid disease     Current Outpatient Medications:    albuterol (VENTOLIN HFA) 108 (90 Base) MCG/ACT inhaler, INHALE 2 PUFFS BY MOUTH EVERY 6 HOURS AS NEEDED, Disp: 18 each, Rfl: 5   cefdinir (OMNICEF) 300 MG capsule, Take 1 capsule (300 mg total) by mouth 2 (two) times daily. 1 po BID, Disp: 20 capsule, Rfl: 0   cetirizine (ZYRTEC) 10 MG tablet, Take 1 tablet (10 mg total) by mouth at bedtime., Disp: 30 tablet, Rfl: 5   EPINEPHrine 0.3 mg/0.3 mL IJ SOAJ injection, Inject 0.3 mg into the muscle as needed for anaphylaxis., Disp: 1 each, Rfl: 3   esomeprazole (NEXIUM) 40 MG capsule, Take 1 capsule (40 mg total) by mouth as needed., Disp: 90 capsule, Rfl: 3   fluticasone-salmeterol (ADVAIR DISKUS) 250-50 MCG/ACT AEPB, Inhale 1 puff into the lungs in the morning and at bedtime. Change in dose, Disp: 180 each, Rfl: 3   furosemide (LASIX) 40 MG tablet, Take 1 tablet (40 mg total) by mouth daily as needed., Disp: 90 tablet, Rfl: 0   hydrOXYzine (VISTARIL) 25 MG capsule, Take 1 capsule (25 mg total) by mouth every 8 (eight) hours as needed for itching., Disp: 30 capsule, Rfl: 0   hyoscyamine (LEVSIN SL) 0.125 MG SL tablet, Place 1 tablet (0.125 mg total) under the tongue as needed. Place 1-2 tablet under the tongue until  it dissolves as needed, Disp: 30 tablet, Rfl: 5   icosapent Ethyl (VASCEPA) 1 g capsule, Take 1 capsule (1 g total) by mouth 2 (two) times daily., Disp: 180 capsule, Rfl: 3   levothyroxine (SYNTHROID) 125 MCG tablet, TAKE 1 TABLET BY MOUTH EVERY DAY, Disp: 90 tablet, Rfl: 0   losartan-hydrochlorothiazide (HYZAAR) 100-12.5 MG tablet, Take 1 tablet by mouth daily. as directed, Disp: 90 tablet, Rfl: 3   montelukast (SINGULAIR) 10 MG tablet, TAKE 1 TABLET BY MOUTH EVERYDAY AT BEDTIME, Disp: 90 tablet, Rfl: 3   Multiple Vitamins-Minerals (MULTIVITAMIN WITH MINERALS) tablet, Take 1 tablet by mouth daily. One a  day woman, Disp: , Rfl:    naproxen (NAPROSYN) 500 MG tablet, Take 500 mg by mouth daily as needed (Back pain)., Disp: , Rfl:    ondansetron (ZOFRAN) 4 MG tablet, Take 1 tablet (4 mg total) by mouth every 6 (six) hours as needed for nausea., Disp: 20 tablet, Rfl: 0   potassium chloride (KLOR-CON) 10 MEQ tablet, Take 1 tablet (10 mEq total) by mouth daily., Disp: 90 tablet, Rfl: 0   rosuvastatin (CRESTOR) 20 MG tablet, TAKE 1 TABLET BY MOUTH EVERY DAY, Disp: 90 tablet, Rfl: 3   Vitamin D, Ergocalciferol, (DRISDOL) 1.25 MG (50000 UNIT) CAPS capsule, TAKE 1 CAPSULE (50,000 UNITS TOTAL) BY MOUTH EVERY 7 (SEVEN) DAYS, Disp: 13 capsule, Rfl: 3  Gen: nontoxic female/ NAD Pulm: normal work of breahting on room air. No appreciable wheezes. Coughing intermittently.   Assessment/ Plan: 62 y.o. female   Acute bacterial bronchitis - Plan: cefdinir (OMNICEF) 300 MG capsule, predniSONE (STERAPRED UNI-PAK 21 TAB) 10 MG (21) TBPK tablet, Ambulatory referral to Allergy, benzonatate (TESSALON PERLES) 100 MG capsule  Mild persistent asthma with exacerbation - Plan: Ambulatory referral to Allergy  Concerning that she is having yet another asthma exacerbation with what sounds like bacterial bronchitis given brown sputum.  This is despite recent advancement of the Advair to 250 mcg. I am going to place her on antibiotics, another steroid pack but I am going to go ahead and place referral to allergy and asthma for this.  We discussed the risks of recurrent steroid use.  Home care instructions were reviewed and reasons for reevaluation discussed.  She will follow-up as needed  Start time: 12:25pm End time: 12:33pm  Total time spent on patient care (including video visit/ documentation): 8 minutes  Tashawna Thom Hulen Skains, DO [facility] [phone]

## 2022-11-15 ENCOUNTER — Telehealth: Payer: Self-pay | Admitting: Family Medicine

## 2022-11-15 NOTE — Telephone Encounter (Signed)
As we discussed during the visit, if symptoms are worse, she needs to be seen again in office.  Her last 2 visits for this were video.  This is no longer appropriate for this patient.

## 2022-11-15 NOTE — Telephone Encounter (Signed)
Pt called to let Dr Nadine Counts know that she finished her meds and says her cough is worse than it was before. Needs advise on what to do. Please advise and call patient.  Pt says she did schedule an appt with the allergy specialist but says the appt isnt until May.

## 2022-11-16 ENCOUNTER — Ambulatory Visit (INDEPENDENT_AMBULATORY_CARE_PROVIDER_SITE_OTHER): Payer: BC Managed Care – PPO | Admitting: Family Medicine

## 2022-11-16 ENCOUNTER — Encounter: Payer: Self-pay | Admitting: Family Medicine

## 2022-11-16 VITALS — BP 131/87 | HR 86 | Temp 98.2°F | Ht 63.0 in | Wt 194.2 lb

## 2022-11-16 DIAGNOSIS — J329 Chronic sinusitis, unspecified: Secondary | ICD-10-CM

## 2022-11-16 DIAGNOSIS — J40 Bronchitis, not specified as acute or chronic: Secondary | ICD-10-CM

## 2022-11-16 MED ORDER — FEXOFENADINE-PSEUDOEPHED ER 180-240 MG PO TB24: 1.0000 | ORAL_TABLET | Freq: Every evening | ORAL | 11 refills | Status: DC

## 2022-11-16 MED ORDER — BETAMETHASONE SOD PHOS & ACET 6 (3-3) MG/ML IJ SUSP
6.0000 mg | Freq: Once | INTRAMUSCULAR | Status: AC
Start: 2022-11-16 — End: ?

## 2022-11-16 MED ORDER — MOXIFLOXACIN HCL 400 MG PO TABS
400.0000 mg | ORAL_TABLET | Freq: Every day | ORAL | 0 refills | Status: DC
Start: 2022-11-16 — End: 2022-12-19

## 2022-11-16 NOTE — Progress Notes (Signed)
Chief Complaint  Patient presents with   Cough    HPI  Patient presents today for Symptoms include congestion,  nasal congestion, deep, non productive cough, chest rattling. Has  post nasal drip. There is no fever, chills, or sweats. Onset of symptoms was two weeks ago, gradually worsening since that time. No relief with prednisone and omnicef. Using her advair and albuterol.    PMH: Smoking status noted ROS: Per HPI  Objective: BP 131/87   Pulse 86   Temp 98.2 F (36.8 C)   Ht  (1.6 m)   Wt 194 lb 3.2 oz (88.1 kg)   LMP 11/29/2011   SpO2 94%   BMI 34.40 kg/m  Gen: NAD, alert, cooperative with exam HEENT: NCAT, EOMI, PERRL. Tms clear nasal passages swollen & red CV: RRR, good S1/S2, no murmur Resp: CTABL, no wheezes, non-labored Abd: SNTND, BS present, no guarding or organomegaly Ext: No edema, warm Neuro: Alert and oriented, No gross deficits  Assessment and plan:  1. Sinobronchitis     Meds ordered this encounter  Medications   moxifloxacin (AVELOX) 400 MG tablet    Sig: Take 1 tablet (400 mg total) by mouth daily.    Dispense:  10 tablet    Refill:  0   fexofenadine-pseudoephedrine (ALLEGRA-D 24) 180-240 MG 24 hr tablet    Sig: Take 1 tablet by mouth every evening. For allergy and congestion    Dispense:  30 tablet    Refill:  11   betamethasone acetate-betamethasone sodium phosphate (CELESTONE) injection 6 mg    No orders of the defined types were placed in this encounter.   Follow up as needed.  Mechele Claude, MD

## 2022-11-16 NOTE — Telephone Encounter (Signed)
Called and got pt a apt today to see pcp

## 2022-12-12 DIAGNOSIS — M25562 Pain in left knee: Secondary | ICD-10-CM | POA: Diagnosis not present

## 2022-12-19 ENCOUNTER — Ambulatory Visit: Payer: BC Managed Care – PPO | Admitting: Internal Medicine

## 2022-12-19 ENCOUNTER — Other Ambulatory Visit: Payer: Self-pay

## 2022-12-19 ENCOUNTER — Encounter: Payer: Self-pay | Admitting: Internal Medicine

## 2022-12-19 VITALS — BP 130/72 | HR 62 | Temp 98.2°F | Resp 16 | Ht 63.0 in | Wt 197.0 lb

## 2022-12-19 DIAGNOSIS — J4540 Moderate persistent asthma, uncomplicated: Secondary | ICD-10-CM | POA: Diagnosis not present

## 2022-12-19 DIAGNOSIS — Z91018 Allergy to other foods: Secondary | ICD-10-CM

## 2022-12-19 DIAGNOSIS — J3089 Other allergic rhinitis: Secondary | ICD-10-CM

## 2022-12-19 MED ORDER — ALBUTEROL SULFATE HFA 108 (90 BASE) MCG/ACT IN AERS: INHALATION_SPRAY | RESPIRATORY_TRACT | 5 refills | Status: DC

## 2022-12-19 MED ORDER — CETIRIZINE HCL 10 MG PO TABS
10.0000 mg | ORAL_TABLET | Freq: Every day | ORAL | 5 refills | Status: DC
Start: 2022-12-19 — End: 2023-02-14

## 2022-12-19 MED ORDER — AZELASTINE HCL 0.1 % NA SOLN: 1.0000 | Freq: Two times a day (BID) | NASAL | 5 refills | Status: DC | PRN

## 2022-12-19 MED ORDER — MONTELUKAST SODIUM 10 MG PO TABS: ORAL_TABLET | ORAL | 3 refills | Status: DC

## 2022-12-19 MED ORDER — EPINEPHRINE 0.3 MG/0.3ML IJ SOAJ: 0.3000 mg | INTRAMUSCULAR | 3 refills | Status: DC | PRN

## 2022-12-19 MED ORDER — FLUTICASONE-SALMETEROL 250-50 MCG/ACT IN AEPB: 1.0000 | INHALATION_SPRAY | Freq: Two times a day (BID) | RESPIRATORY_TRACT | 5 refills | Status: DC

## 2022-12-19 MED ORDER — FLUTICASONE PROPIONATE 50 MCG/ACT NA SUSP
2.0000 | Freq: Every day | NASAL | 5 refills | Status: DC
Start: 2022-12-19 — End: 2023-02-14

## 2022-12-19 NOTE — Patient Instructions (Addendum)
Moderate Persistent Asthma - MDI technique discussed.   - Maintenance inhaler: Continue Wixela 250-42mcg 1 puff twice daily with the proper technique. Continue Singulair 10mg  daily  - Rescue inhaler: Albuterol 2 puffs via spacer or 1 vial via nebulizer every 4-6 hours as needed for respiratory symptoms of cough, shortness of breath, or wheezing Asthma control goals:  Full participation in all desired activities (may need albuterol before activity) Albuterol use two times or less a week on average (not counting use with activity) Cough interfering with sleep two times or less a month Oral steroids no more than once a year No hospitalizations   Allergic Rhinitis: - Positive skin test 11/2022: dust mite, mouse - Avoidance measures discussed. - Use nasal saline rinses before nose sprays such as with Neilmed Sinus Rinse.  Use distilled water.   - Use Flonase 2 sprays each nostril daily. Aim upward and outward. - Use Azelastine 1-2 sprays each nostril twice daily as needed for congestion, runny nose, drainage, sneezing.  Aim upward and outward.  Hold this 3 days prior to next visit for intradermal testing.  - Use Zyrtec 10 mg daily.  Hold this 3 days prior to next visit for intradermal testing.  - Use Singulair 10mg  daily. Stop if there are any mood/behavioral changes. 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  - can retest in the future.  - for SKIN only reaction, okay to take Benadryl 25mg  capsules every 6 hours as needed.  - for SKIN + ANY additional symptoms, OR IF concern for LIFE THREATENING reaction = Epipen Autoinjector EpiPen 0.3 mg. - If using Epinephrine autoinjector, call 911  or go to the ER.

## 2022-12-19 NOTE — Progress Notes (Signed)
NEW PATIENT  Date of Service/Encounter:  12/19/22  Consult requested by: Mechele Claude, MD   Subjective:   Sarah Garrett (DOB: Dec 24, 1960) is a 62 y.o. female who presents to the clinic on 12/19/2022 with a chief complaint of Asthma and Cough (Better - Patient stated when doesn't take her antihistamines regularly - cough comes back; voice changes; sore throat) .    History obtained from: chart review and patient.   Asthma:  Diagnosed in high school.  It has been up and down over the years.  It was more severe when she was younger. In 1990s, she had a severe exacerbation requiring intubation.  Previously saw Dr. Barnie Del and Dr. Christell Constant and did AIT and asthma care with them but then following with PCP since their retirement.   Still has some shortness of breath with activity but reports no severe flare ups.  She does flare up easily with illness.    Few times a week daytime symptoms in past month, sometimes  nighttime awakenings in past month Using rescue inhaler: last use was about 1 month ago  Limitations to daily activity: mild 0 ED visits/UC visits and 2 oral steroids in the past year, mostly sees PCP  1 number of lifetime hospitalizations, 1 number of lifetime intubations.  Identified Triggers: respiratory illness Prior PFTs or spirometry: none available for review  Previously used therapies: Advair Diskus but no longer covered by insurance Current regimen:  Maintenance: Wixela 250-5 1 puff BID; doing okay with this  Rescue: Albuterol 2 puffs q4-6 hrs PRN. Also has a nebulizer machine.   Rhinitis:  Started in childhood.  Symptoms include: nasal congestion, rhinorrhea, post nasal drainage, sneezing, watery eyes, and itchy eyes If this continues, then it turns into a sinus infections and "bronchitis."   Occurs year-round Potential triggers: not sure Treatments tried:  AIT for 15-20 years.  Zyrtec; last use 3 days ago Singulair daily No nose sprays   Previous  allergy testing: yes  History of reflux/heartburn: yes; controlled Nexium  History of sinus surgery: no Nonallergic triggers: none    Concern for Food Allergy:  Foods of concern: mammalian meat  History of reaction:  Reports recurrent rashes similar to hives on legs around 2020.  Alpha gal IgE was positive per patient.  It resolved with mammalian meat cessation. No recent tick bites.  Previous allergy testing yes Carries an epinephrine autoinjector: yes   Past Medical History: Past Medical History:  Diagnosis Date   Anemia    Diverticulosis    GERD (gastroesophageal reflux disease)    H/O adenoidectomy    Hemochromatosis 07/07/2011   Hyperlipidemia    Hypertension    Melanoma (HCC)    right shoulder   Thyroid disease     Past Surgical History: Past Surgical History:  Procedure Laterality Date   BREAST BIOPSY  07/31/2002   left; benign   COLONOSCOPY WITH PROPOFOL N/A 06/29/2020   Procedure: COLONOSCOPY WITH PROPOFOL;  Surgeon: Malissa Hippo, MD;  Location: [facility];  Service: Endoscopy;  Laterality: N/A;   KNEE ARTHROSCOPY W/ MENISCAL REPAIR Right    MELANOMA EXCISION  09/28/2011   right shoulder   TONSILLECTOMY AND ADENOIDECTOMY      Family History: Family History  Problem Relation Age of Onset   Heart disease Mother        by pass   Breast cancer Mother 5   Colon cancer Neg Hx    Stomach cancer Neg Hx     Social History:  Lives in a 48 year house Flooring in bedroom: carpet Pets: dog Tobacco use/exposure: none Job: retired   Medication List:  Allergies as of 12/19/2022       Reactions   Aspirin Swelling   Swelling of mouth and throat   Ace Inhibitors Other (See Comments)   Per [id number]   Actos [pioglitazone Hydrochloride] Other (See Comments)   Per pt: unknown   Alpha-gal Other (See Comments)   unknown   Ciprofloxacin Other (See Comments)   Unknown reaction   Crestor [rosuvastatin Calcium] Other (See Comments)   Per pt: unknown    Pneumococcal Vaccines Rash   Sulfonamide Derivatives Other (See Comments)   Swelling of mouth and throat        Medication List        Accurate as of Dec 19, 2022  6:08 PM. If you have any questions, ask your nurse or doctor.          STOP taking these medications    benzonatate 100 MG capsule Commonly known as: Lawyer Stopped by: Birder Robson, MD   cefdinir 300 MG capsule Commonly known as: OMNICEF Stopped by: Birder Robson, MD   esomeprazole 40 MG capsule Commonly known as: NexIUM Stopped by: Birder Robson, MD   hydrOXYzine 25 MG capsule Commonly known as: VISTARIL Stopped by: Birder Robson, MD   moxifloxacin 400 MG tablet Commonly known as: AVELOX Stopped by: Birder Robson, MD   naproxen 500 MG tablet Commonly known as: NAPROSYN Stopped by: Birder Robson, MD   ondansetron 4 MG tablet Commonly known as: ZOFRAN Stopped by: Birder Robson, MD       TAKE these medications    albuterol 108 (90 Base) MCG/ACT inhaler Commonly known as: Ventolin HFA INHALE 2 PUFFS BY MOUTH EVERY 6 HOURS AS NEEDED   azelastine 0.1 % nasal spray Commonly known as: ASTELIN Place 1 spray into both nostrils 2 (two) times daily as needed for allergies or rhinitis. Use in each nostril as directed Started by: Birder Robson, MD   cetirizine 10 MG tablet Commonly known as: ZyrTEC Allergy Take 1 tablet (10 mg total) by mouth daily. Started by: Birder Robson, MD   EPINEPHrine 0.3 mg/0.3 mL Soaj injection Commonly known as: EPI-PEN Inject 0.3 mg into the muscle as needed for anaphylaxis.   fexofenadine-pseudoephedrine 180-240 MG 24 hr tablet Commonly known as: ALLEGRA-D 24 Take 1 tablet by mouth every evening. For allergy and congestion   fluticasone 50 MCG/ACT nasal spray Commonly known as: FLONASE Place 2 sprays into both nostrils daily. Started by: Birder Robson, MD   fluticasone-salmeterol 250-50 MCG/ACT Aepb Commonly known as: Wixela Inhub Inhale 1 puff  into the lungs in the morning and at bedtime. What changed: Another medication with the same name was removed. Continue taking this medication, and follow the directions you see here. Changed by: Birder Robson, MD   furosemide 40 MG tablet Commonly known as: LASIX Take 1 tablet (40 mg total) by mouth daily as needed.   hyoscyamine 0.125 MG SL tablet Commonly known as: LEVSIN SL Place 1 tablet (0.125 mg total) under the tongue as needed. Place 1-2 tablet under the tongue until it dissolves as needed   icosapent Ethyl 1 g capsule Commonly known as: Vascepa Take 1 capsule (1 g total) by mouth 2 (two) times daily.   levothyroxine 125 MCG tablet Commonly known as: SYNTHROID TAKE 1 TABLET BY MOUTH EVERY DAY   losartan-hydrochlorothiazide 100-12.5  MG tablet Commonly known as: HYZAAR Take 1 tablet by mouth daily. as directed   montelukast 10 MG tablet Commonly known as: SINGULAIR TAKE 1 TABLET BY MOUTH EVERYDAY AT BEDTIME   multivitamin with minerals tablet Take 1 tablet by mouth daily. One a day woman   potassium chloride 10 MEQ tablet Commonly known as: KLOR-CON Take 1 tablet (10 mEq total) by mouth daily.   rosuvastatin 20 MG tablet Commonly known as: CRESTOR TAKE 1 TABLET BY MOUTH EVERY DAY   Vitamin D (Ergocalciferol) 1.25 MG (50000 UNIT) Caps capsule Commonly known as: DRISDOL TAKE 1 CAPSULE (50,000 UNITS TOTAL) BY MOUTH EVERY 7 (SEVEN) DAYS         REVIEW OF SYSTEMS: Pertinent positives and negatives discussed in HPI.   Objective:   Physical Exam: BP 130/72 (BP Location: Right Arm, Patient Position: Sitting, Cuff Size: Large)   Pulse 62   Temp 98.2 F (36.8 C) (Temporal)   Resp 16   Ht 5\' 3"  (1.6 m)   Wt 197 lb (89.4 kg)   LMP 11/29/2011   SpO2 96%   BMI 34.90 kg/m  Body mass index is 34.9 kg/m. GEN: alert, well developed HEENT: clear conjunctiva, TM grey and translucent, nose with + inferior turbinate hypertrophy, pale nasal mucosa, slight clear  rhinorrhea, + cobblestoning HEART: regular rate and rhythm, no murmur LUNGS: clear to auscultation bilaterally, no coughing, unlabored respiration ABDOMEN: soft, non distended  SKIN: no rashes or lesions  Reviewed:  11/07/2022: saw Dr. Nadine Counts for wheezing, cough, congestion, drainage.  Increased Advair dose.  Given prednisone pack and omnicef.   11/16/2022: saw Dr. Broadus John PCP for congestion, deep non productive cough, PND.  Started about 2 weeks ago.  Using Advair and Albuterol without relief. Given prednisone and omnicef preivously.  Started on Moxifloxacin and Allegra D.  06/20/2022: saw PCP for routine visit.  Had a mild asthma attack resolved with Albuterol. On Adviar Diskus and Singulair.  Also has an Epipen for alpha gal.   04/06/2020: IgE alpha gal 0.13 05/2022: AEC 500  Spirometry:  Tracings reviewed. Her effort: Good reproducible efforts. FVC: 2.09L, 71%; post 2.35L, 80% FEV1: 1.24L, 54% predicted; post 1.51L, 66% FEV1/FVC ratio: 59% Interpretation: Spirometry consistent with mixed obstructive and restrictive disease. Significant bronchodilator response/reversibility present.  Please see scanned spirometry results for details.  Skin Testing:  Skin prick testing was placed, which includes aeroallergens/foods, histamine control, and saline control.  Verbal consent was obtained prior to placing test.   Allergy testing results were read and interpreted by myself, documented by clinical staff. Adequate positive and negative control.  Patient reported a queasy feeling in stomach after skin testing so intradermal testing was not performed. No respiratory or skin symptoms. Given water and observed her 30 minutes afterwards without any issues.  Rechecked vitals stable.   Results discussed with patient/family.  Airborne Adult Perc - 12/19/22 1047     Time Antigen Placed 1047    Allergen Manufacturer Waynette Buttery    Location Back    Number of Test 59    1. Control-Buffer 50% Glycerol  Negative    2. Control-Histamine 1 mg/ml 3+    3. Albumin saline Negative    4. Bahia Negative    5. French Southern Territories Negative    6. Johnson Negative    7. Kentucky Blue Negative    8. Meadow Fescue Negative    9. Perennial Rye Negative    10. Sweet Vernal Negative    11. Timothy Negative    12.  Cocklebur Negative    13. Burweed Marshelder Negative    14. Ragweed, short Negative    15. Ragweed, Giant Negative    16. Plantain,  English Negative    17. Lamb's Quarters Negative    18. Sheep Sorrell Negative    19. Rough Pigweed Negative    20. Marsh Elder, Rough Negative    21. Mugwort, Common Negative    22. Ash mix Negative    23. Birch mix Negative    24. Beech American Negative    25. Box, Elder Negative    26. Cedar, red Negative    27. Cottonwood, Guinea-Bissau Negative    28. Elm mix Negative    29. Hickory Negative    30. Maple mix Negative    32. Pecan Pollen Negative    33. Pine mix Negative    34. Sycamore Eastern Negative    35. Walnut, Black Pollen Negative    36. Alternaria alternata Negative    37. Cladosporium Herbarum Negative    38. Aspergillus mix Negative    39. Penicillium mix Negative    40. Bipolaris sorokiniana (Helminthosporium) Negative    41. Drechslera spicifera (Curvularia) Negative    42. Mucor plumbeus Negative    43. Fusarium moniliforme Negative    44. Aureobasidium pullulans (pullulara) Negative    45. Rhizopus oryzae Negative    46. Botrytis cinera Negative    47. Epicoccum nigrum Negative    48. Phoma betae Negative    49. Candida Albicans Negative    50. Trichophyton mentagrophytes Negative    51. Mite, D Farinae  5,000 AU/ml Negative    52. Mite, D Pteronyssinus  5,000 AU/ml 3+    53. Cat Hair 10,000 BAU/ml Negative    54.  Dog Epithelia Negative    55. Mixed Feathers Negative    56. Horse Epithelia Negative    57. Cockroach, German Negative    58. Mouse 2+    59. Tobacco Leaf Negative             Intradermal - 12/19/22 1121      Time Antigen Placed --    Allergen Manufacturer --    Location --    Number of Test --            Intradermal testing was not performed today.    Assessment:   1. Moderate persistent asthma without complication   2. Allergy to alpha-gal   3. Allergic rhinitis due to dust mite     Plan/Recommendations:  Moderate Persistent Asthma - Spirometry with reversible obstruction. Poor technique so we worked on this today. MDI technique discussed.   - Maintenance inhaler: Continue Wixela 250-39mcg 1 puff twice daily. Continue Singulair 10mg  daily  - Rescue inhaler: Albuterol 2 puffs via spacer or 1 vial via nebulizer every 4-6 hours as needed for respiratory symptoms of cough, shortness of breath, or wheezing Asthma control goals:  Full participation in all desired activities (may need albuterol before activity) Albuterol use two times or less a week on average (not counting use with activity) Cough interfering with sleep two times or less a month Oral steroids no more than once a year No hospitalizations   Allergic Rhinitis: - Due to turbinate hypertrophy and unresponsive to OTC meds, performed skin testing to identify aeroallergen triggers.   - Positive skin test 11/2022: dust mite, mouse.  Unable to do intradermals today as she reported feeling queasy in stomach after skin testing; will plan to do this next visit.  -  Avoidance measures discussed. - Use nasal saline rinses before nose sprays such as with Neilmed Sinus Rinse.  Use distilled water.   - Use Flonase 2 sprays each nostril daily. Aim upward and outward. - Use Azelastine 1-2 sprays each nostril twice daily as needed for congestion, runny nose, drainage, sneezing.  Aim upward and outward. Hold this 3 days prior to next visit for intradermal testing  to aeroallergens.  - Use Zyrtec 10 mg daily. Hold this 3 days prior to next visit for intradermal testing to aeroallergens.  - Use Singulair 10mg  daily. Stop if there are any  mood/behavioral changes. 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  - can retest in the future.  - for SKIN only reaction, okay to take Benadryl 25mg  capsules every 6 hours as needed.  - for SKIN + ANY additional symptoms, OR IF concern for LIFE THREATENING reaction = Epipen Autoinjector EpiPen 0.3 mg. - If using Epinephrine autoinjector, call 911  or go to the ER.      Return in about 6 weeks (around 01/30/2023).  Alesia Morin, MD [facility]

## 2023-01-07 ENCOUNTER — Other Ambulatory Visit: Payer: Self-pay | Admitting: Family Medicine

## 2023-01-07 DIAGNOSIS — E032 Hypothyroidism due to medicaments and other exogenous substances: Secondary | ICD-10-CM

## 2023-01-08 ENCOUNTER — Other Ambulatory Visit: Payer: Self-pay | Admitting: Family Medicine

## 2023-02-09 ENCOUNTER — Ambulatory Visit: Payer: BC Managed Care – PPO | Admitting: Internal Medicine

## 2023-02-12 ENCOUNTER — Other Ambulatory Visit: Payer: Self-pay | Admitting: Family Medicine

## 2023-02-14 ENCOUNTER — Other Ambulatory Visit: Payer: Self-pay

## 2023-02-14 ENCOUNTER — Encounter: Payer: Self-pay | Admitting: Internal Medicine

## 2023-02-14 ENCOUNTER — Ambulatory Visit: Payer: BC Managed Care – PPO | Admitting: Internal Medicine

## 2023-02-14 VITALS — BP 126/82 | HR 79 | Temp 98.7°F | Resp 18 | Wt 200.0 lb

## 2023-02-14 DIAGNOSIS — J3081 Allergic rhinitis due to animal (cat) (dog) hair and dander: Secondary | ICD-10-CM

## 2023-02-14 DIAGNOSIS — J3089 Other allergic rhinitis: Secondary | ICD-10-CM | POA: Diagnosis not present

## 2023-02-14 DIAGNOSIS — J4540 Moderate persistent asthma, uncomplicated: Secondary | ICD-10-CM | POA: Diagnosis not present

## 2023-02-14 DIAGNOSIS — J301 Allergic rhinitis due to pollen: Secondary | ICD-10-CM

## 2023-02-14 DIAGNOSIS — Z91018 Allergy to other foods: Secondary | ICD-10-CM

## 2023-02-14 MED ORDER — SPACER/AERO-HOLDING CHAMBERS DEVI: 1.0000 | Freq: Two times a day (BID) | 1 refills | Status: AC

## 2023-02-14 MED ORDER — CETIRIZINE HCL 10 MG PO TABS: 10.0000 mg | ORAL_TABLET | Freq: Every day | ORAL | 5 refills | Status: DC

## 2023-02-14 MED ORDER — MONTELUKAST SODIUM 10 MG PO TABS: ORAL_TABLET | ORAL | 1 refills | Status: DC

## 2023-02-14 MED ORDER — BREZTRI AEROSPHERE 160-9-4.8 MCG/ACT IN AERO: 2.0000 | INHALATION_SPRAY | Freq: Two times a day (BID) | RESPIRATORY_TRACT | 5 refills | Status: DC

## 2023-02-14 MED ORDER — EPINEPHRINE 0.3 MG/0.3ML IJ SOAJ: 0.3000 mg | INTRAMUSCULAR | 1 refills | Status: DC | PRN

## 2023-02-14 MED ORDER — FLUTICASONE PROPIONATE 50 MCG/ACT NA SUSP: 2.0000 | Freq: Every day | NASAL | 5 refills | Status: DC

## 2023-02-14 MED ORDER — AZELASTINE HCL 0.1 % NA SOLN: 1.0000 | Freq: Two times a day (BID) | NASAL | 5 refills | Status: DC | PRN

## 2023-02-14 MED ORDER — ALBUTEROL SULFATE HFA 108 (90 BASE) MCG/ACT IN AERS: 1.0000 | INHALATION_SPRAY | Freq: Four times a day (QID) | RESPIRATORY_TRACT | 1 refills | Status: DC | PRN

## 2023-02-14 NOTE — Progress Notes (Signed)
FOLLOW UP Date of Service/Encounter:  02/14/23   Subjective:  Sarah Garrett (DOB: September 25, 1960) is a 62 y.o. female who [facility] for follow up for moderate persistent asthma, allergic rhinoconjunctivitis and alpha gal allergy.   History obtained from: chart review and patient. At last visit, spirometry showed reversible obstruction and with poor technique, so discussed proper technique with continuing Wixela and Singulair.  Feeling queasy so held off on intradermals.  SPT positive to dust mites/mouse.  Started on Flonase, Azelastine, Zyrtec. Avoiding alpha gal, has Epipen.   Asthma: Reports doing okay overall but has noticed some worsening since stopping her Zyrtec for intradermal testing today.  Feels like she has noted more coughing and shortness of breath.  Also was working in the basement and had exposure to lots of dust. Rarely needs Albuterol.  Taking Wixela BID but feels like it goes to her throat rather than lungs.  Also on Singulair. No ER visits/oral prednisone since last visit.   Allergies:  Have flared up since stopping Zyrtec also with more congestion, drainage.  Trying to stay indoors more. Held anti histamines for intradermal testing.  Taking Flonase and previously was also using Azelastine and Zyrtec daily.    Alpha Gal:  Still avoiding mammalian meat. Has an Epipen.  No accidental exposures or reactions.    Past Medical History: Past Medical History:  Diagnosis Date   Anemia    Diverticulosis    GERD (gastroesophageal reflux disease)    H/O adenoidectomy    Hemochromatosis 07/07/2011   Hyperlipidemia    Hypertension    Melanoma (HCC)    right shoulder   Thyroid disease     Objective:  BP 126/82   Pulse 79   Temp 98.7 F (37.1 C) (Temporal)   Resp 18   Wt 200 lb (90.7 kg)   LMP 11/29/2011   SpO2 95%   BMI 35.43 kg/m  Body mass index is 35.43 kg/m. Physical Exam: GEN: alert, well developed HEENT: clear  conjunctiva, TM grey and translucent, nose with moderate inferior turbinate hypertrophy, pink nasal mucosa, clear rhinorrhea, + cobblestoning HEART: regular rate and rhythm, no murmur LUNGS: clear to auscultation bilaterally, no coughing, unlabored respiration SKIN: no rashes or lesions  Spirometry:  Tracings reviewed. Her effort: Good reproducible efforts. FVC: 2.36L FEV1: 1.40L, 61% predicted FEV1/FVC ratio: 59% Interpretation: Spirometry consistent with moderate obstructive disease.  Please see scanned spirometry results for details.  Skin Testing:  Skin prick testing was placed, which includes aeroallergens/foods, histamine control, and saline control.  Verbal consent was obtained prior to placing test.  Patient tolerated procedure well.  Allergy testing results were read and interpreted by myself, documented by clinical staff. Adequate positive and negative control.  Positive results to:  Results discussed with patient/family.  Intradermal - 02/14/23 1100     Time Antigen Placed 1114    Allergen Manufacturer Waynette Buttery    Location Arm    Number of Test 15    Control Negative    French Southern Territories Negative    Johnson Negative    7 Grass Negative    Ragweed Mix 2+    Weed Mix Negative    Tree Mix 2+    Mold 1 Negative    Mold 2 Negative    Mold 3 2+    Mold 4 3+    Cat 2+    Dog 3+    Cockroach Negative  Assessment:   1. Moderate persistent asthma without complication   2. Allergy to alpha-gal   3. Allergic rhinitis due to dust mite   4. Allergic rhinitis due to animal hair or dander   5. Allergic rhinitis caused by mold   6. Seasonal allergic rhinitis due to pollen     Plan/Recommendations:  Moderate Persistent Asthma - Uncontrolled.  Spirometry with obstruction today. Will stop ICS/LABA and escalated to ICS/LABA/LAMA with spacer for better technique.  Her technique was not the best today so I did tell her to pick up the spacer  - Maintenance inhaler: Stop  Wixela. Start Breztri 160-9-4.28mcg 2 puffs twice daily with spacer Continue Singulair 10mg  daily  - Rescue inhaler: Albuterol 2 puffs via spacer or 1 vial via nebulizer every 4-6 hours as needed for respiratory symptoms of cough, shortness of breath, or wheezing Asthma control goals:  Full participation in all desired activities (may need albuterol before activity) Albuterol use two times or less a week on average (not counting use with activity) Cough interfering with sleep two times or less a month Oral steroids no more than once a year No hospitalizations   Allergic Rhinitis: - Uncontrolled with medical management as below but still having frequent symptoms.  Discussed to proceed with intradermal testing as she has not been well controlled while on multiple medications as discussed below.  - Positive skin test 11/2022: trees, ragweed, mold, cats, dogs, dust mite, mouse - Avoidance measures discussed. - Use nasal saline rinses before nose sprays such as with Neilmed Sinus Rinse.  Use distilled water.   - Use Flonase 2 sprays each nostril daily. Aim upward and outward. - Restart Azelastine 1-2 sprays each nostril twice daily as needed for congestion, runny nose, drainage, sneezing.  Aim upward and outward.   - Restart Zyrtec 10 mg daily.  - Use Singulair 10mg  daily. Stop if there are any mood/behavioral changes. 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  - can retest in the future.  - for SKIN only reaction, okay to take Benadryl 25mg  capsules every 6 hours as needed.  - for SKIN + ANY additional symptoms, OR IF concern for LIFE THREATENING reaction = Epipen Autoinjector EpiPen 0.3 mg. - If using Epinephrine autoinjector, call 911  or go to the ER.    Return in about 6 weeks (around 03/28/2023).  Alesia Morin, MD [facility]

## 2023-02-14 NOTE — Patient Instructions (Addendum)
Moderate Persistent Asthma - Maintenance inhaler: Continue Breztri 160-9-4.62mcg 2 puffs twice daily with spacer Continue Singulair 10mg  daily  - Rescue inhaler: Albuterol 2 puffs via spacer or 1 vial via nebulizer every 4-6 hours as needed for respiratory symptoms of cough, shortness of breath, or wheezing Asthma control goals:  Full participation in all desired activities (may need albuterol before activity) Albuterol use two times or less a week on average (not counting use with activity) Cough interfering with sleep two times or less a month Oral steroids no more than once a year No hospitalizations   Allergic Rhinitis: - Positive skin test 11/2022: trees, ragweed, mold, cats, dogs, dust mite, mouse - Avoidance measures discussed. - Use nasal saline rinses before nose sprays such as with Neilmed Sinus Rinse.  Use distilled water.   - Use Flonase 2 sprays each nostril daily. Aim upward and outward. - Use Azelastine 1-2 sprays each nostril twice daily as needed for congestion, runny nose, drainage, sneezing.  Aim upward and outward.   - Use Zyrtec 10 mg daily.  - Use Singulair 10mg  daily. Stop if there are any mood/behavioral changes. 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  - can retest in the future.  - for SKIN only reaction, okay to take Benadryl 25mg  capsules every 6 hours as needed.  - for SKIN + ANY additional symptoms, OR IF concern for LIFE THREATENING reaction = Epipen Autoinjector EpiPen 0.3 mg. - If using Epinephrine autoinjector, call 911  or go to the ER.    ALLERGEN AVOIDANCE MEASURES   Dust Mites Use central air conditioning and heat; and change the filter monthly.  Pleated filters work better than mesh filters.  Electrostatic filters may also be used; wash the filter monthly.  Window air conditioners may be  used, but do not clean the air as well as a central air conditioner.  Change or wash the filter monthly. Keep windows closed.  Do not use attic fans.   Encase the mattress, box springs and pillows with zippered, dust proof covers. Wash the bed linens in hot water weekly.   Remove carpet, especially from the bedroom. Remove stuffed animals, throw pillows, dust ruffles, heavy drapes and other items that collect dust from the bedroom. Do not use a humidifier.   Use wood, vinyl or leather furniture instead of cloth furniture in the bedroom. Keep the indoor humidity at 30 - 40%.  Monitor with a humidity gauge.  Molds - Indoor avoidance Use air conditioning to reduce indoor humidity.  Do not use a humidifier. Keep indoor humidity at 30 - 40%.  Use a dehumidifier if needed. In the bathroom use an exhaust fan or open a window after showering.  Wipe down damp surfaces after showering.  Clean bathrooms with a mold-killing solution (diluted bleach, or products like Tilex, etc) at least once a month. In the kitchen use an exhaust fan to remove steam from cooking.  Throw away spoiled foods immediately, and empty garbage daily.  Empty water pans below self-defrosting refrigerators frequently. Vent the clothes dryer to the outside. Limit indoor houseplants; mold grows in the dirt.  No houseplants in the bedroom. Remove carpet from the bedroom. Encase the mattress and box springs with a zippered encasing.  Molds - Outdoor avoidance Avoid being outside when the grass is being mowed, or the ground is tilled.  Avoid playing in leaves, pine straw, hay, etc.  Dead plant materials contain mold. Avoid going into barns or grain storage areas. Remove leaves, clippings and compost from around the home.  Pollen Avoidance Pollen levels are highest during the mid-day and afternoon.  Consider this when planning outdoor activities. Avoid being outside when the grass is being mowed, or wear a mask if the pollen-allergic  person must be the one to mow the grass. Keep the windows closed to keep pollen outside of the home. Use an air conditioner to filter the air. Take a shower, wash hair, and change clothing after working or playing outdoors during pollen season. Pet Dander Keep the pet out of your bedroom and restrict it to only a few rooms. Be advised that keeping the pet in only one room will not limit the allergens to that room. Don't pet, hug or kiss the pet; if you do, wash your hands with soap and water. High-efficiency particulate air (HEPA) cleaners run continuously in a bedroom or living room can reduce allergen levels over time. Regular use of a high-efficiency vacuum cleaner or a central vacuum can reduce allergen levels. Giving your pet a bath at least once a week can reduce airborne allergen.

## 2023-03-03 ENCOUNTER — Other Ambulatory Visit: Payer: Self-pay | Admitting: Family Medicine

## 2023-03-03 DIAGNOSIS — I10 Essential (primary) hypertension: Secondary | ICD-10-CM

## 2023-03-21 ENCOUNTER — Encounter: Payer: Self-pay | Admitting: Family

## 2023-03-28 ENCOUNTER — Ambulatory Visit: Payer: BC Managed Care – PPO | Admitting: Internal Medicine

## 2023-03-28 ENCOUNTER — Other Ambulatory Visit: Payer: Self-pay

## 2023-03-28 ENCOUNTER — Encounter: Payer: Self-pay | Admitting: Internal Medicine

## 2023-03-28 ENCOUNTER — Encounter: Payer: Self-pay | Admitting: Family

## 2023-03-28 VITALS — BP 118/72 | HR 74 | Temp 98.4°F

## 2023-03-28 DIAGNOSIS — Z91018 Allergy to other foods: Secondary | ICD-10-CM

## 2023-03-28 DIAGNOSIS — J302 Other seasonal allergic rhinitis: Secondary | ICD-10-CM | POA: Diagnosis not present

## 2023-03-28 DIAGNOSIS — J4540 Moderate persistent asthma, uncomplicated: Secondary | ICD-10-CM

## 2023-03-28 DIAGNOSIS — J3089 Other allergic rhinitis: Secondary | ICD-10-CM

## 2023-03-28 MED ORDER — ALBUTEROL SULFATE (2.5 MG/3ML) 0.083% IN NEBU: 2.5000 mg | INHALATION_SOLUTION | Freq: Four times a day (QID) | RESPIRATORY_TRACT | 1 refills | Status: DC | PRN

## 2023-03-28 MED ORDER — FLUTICASONE PROPIONATE 50 MCG/ACT NA SUSP: 2.0000 | Freq: Every day | NASAL | 5 refills | Status: DC

## 2023-03-28 MED ORDER — MONTELUKAST SODIUM 10 MG PO TABS: ORAL_TABLET | ORAL | 1 refills | Status: DC

## 2023-03-28 MED ORDER — ALBUTEROL SULFATE HFA 108 (90 BASE) MCG/ACT IN AERS: 1.0000 | INHALATION_SPRAY | Freq: Four times a day (QID) | RESPIRATORY_TRACT | 1 refills | Status: DC | PRN

## 2023-03-28 MED ORDER — BREZTRI AEROSPHERE 160-9-4.8 MCG/ACT IN AERO: 2.0000 | INHALATION_SPRAY | Freq: Two times a day (BID) | RESPIRATORY_TRACT | 5 refills | Status: DC

## 2023-03-28 MED ORDER — AZELASTINE HCL 0.1 % NA SOLN: 1.0000 | Freq: Two times a day (BID) | NASAL | 5 refills | Status: DC | PRN

## 2023-03-28 MED ORDER — CETIRIZINE HCL 10 MG PO TABS: 10.0000 mg | ORAL_TABLET | Freq: Every day | ORAL | 5 refills | Status: DC

## 2023-03-28 NOTE — Patient Instructions (Addendum)
Moderate Persistent Asthma - Maintenance inhaler: Continue Breztri 160-9-4.65mcg 2 puffs twice daily with spacer Continue Singulair 10mg  daily  - Rescue inhaler: Albuterol 2 puffs via spacer or 1 vial via nebulizer every 4-6 hours as needed for respiratory symptoms of cough, shortness of breath, or wheezing Asthma control goals:  Full participation in all desired activities (may need albuterol before activity) Albuterol use two times or less a week on average (not counting use with activity) Cough interfering with sleep two times or less a month Oral steroids no more than once a year No hospitalizations   Allergic Rhinitis: - Positive skin test 11/2022: trees, ragweed, mold, cats, dogs, dust mite, mouse - Avoidance measures discussed. - Use nasal saline rinses before nose sprays such as with Neilmed Sinus Rinse.  Use distilled water.   - Use Flonase 2 sprays each nostril daily. Aim upward and outward. - Use Azelastine 1-2 sprays each nostril twice daily as needed for congestion, runny nose, drainage, sneezing.  Aim upward and outward.   - Use Zyrtec 10 mg daily.  - Use Singulair 10mg  daily. Stop if there are any mood/behavioral changes. 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  - will retest at next visit.  - for SKIN only reaction, okay to take Benadryl 25mg  capsules every 6 hours as needed.  - for SKIN + ANY additional symptoms, OR IF concern for LIFE THREATENING reaction = Epipen Autoinjector EpiPen 0.3 mg. - If using Epinephrine autoinjector, call 911  or go to the ER.

## 2023-03-28 NOTE — Progress Notes (Signed)
FOLLOW UP Date of Service/Encounter:  03/28/23   Subjective:  Sarah Garrett (DOB: 1960/12/03) is a 62 y.o. female who [facility] for follow up for moderate persistent asthma, allergic rhinoconjunctivitis and alpha gal allergy.   History obtained from: chart review and patient.  At last visit on February 14, 2023 we did switch her from [place] to [place].  For her allergies she is on Singulair, Flonase, Zyrtec, azelastine.  Also avoiding mammalian meat for alpha gal.  Asthma: Asthma Control Test: ACT Total Score: 21.    Reports this has been doing well overall.  She did have to use her albuterol once since last visit when they went to a game in [place] and she had to walk a long distance uphill in hot weather.  Other than that has not had much trouble with shortness of breath or cough or wheeze.  Taking her Breztri twice daily and Singulair daily.  No ER visits or oral prednisone use since last visit.  Rhinoconjunctivitis: Reports doing a lot better since last visit.  Not having as much congestion, drainage, runny nose.  She was even at another family member's house who had a cat and did not have much trouble.  Taking Flonase, Zyrtec, Singulair daily and uses azelastine when needed.  She is planning on traveling to [place] with her mom who also has a Seen.  Alpha gal: Avoiding all mammalian meat.  No accidental exposures.  Has an EpiPen.  Would be interested in reintroduction if test is negative.  Past Medical History: Past Medical History:  Diagnosis Date   Anemia    Diverticulosis    GERD (gastroesophageal reflux disease)    H/O adenoidectomy    Hemochromatosis 07/07/2011   Hyperlipidemia    Hypertension    Melanoma (HCC)    right shoulder   Thyroid disease     Objective:  BP 118/72   Pulse 74   Temp 98.4 F (36.9 C) (Temporal)   LMP 11/29/2011   SpO2 96%  There is no height or weight on file to calculate BMI. Physical  Exam: GEN: alert, well developed HEENT: clear conjunctiva, TM grey and translucent, nose with mild inferior turbinate hypertrophy, pink nasal mucosa, slight clear rhinorrhea, no cobblestoning HEART: regular rate and rhythm, no murmur LUNGS: clear to auscultation bilaterally, no coughing, unlabored respiration SKIN: no rashes or lesions  Spirometry:  Tracings reviewed. Her effort: It was hard to get consistent efforts and there is a question as to whether this reflects a maximal maneuver. FVC: 2.26L FEV1: 1.56L, 68% predicted FEV1/FVC ratio: 69% Interpretation: Spirometry consistent with mixed obstructive and restrictive disease.  Please see scanned spirometry results for details.    Assessment:   1. Moderate persistent asthma without complication   2. Seasonal and perennial allergic rhinitis   3. Allergy to alpha-gal     Plan/Recommendations:  Moderate Persistent Asthma - Well controlled, spirometry with questionable restriction, possibly very mild obstruction.  Doing better with switch to Breztri + spacer.  Will continue for now.  MDI teaching done.  - Maintenance inhaler: Continue Breztri 160-9-4.29mcg 2 puffs twice daily with spacer Continue Singulair 10mg  daily  - Rescue inhaler: Albuterol 2 puffs via spacer or 1 vial via nebulizer every 4-6 hours as needed for respiratory symptoms of cough, shortness of breath, or wheezing Asthma control goals:  Full participation in all desired activities (may need albuterol before activity) Albuterol use two times or less a week on average (not  counting use with activity) Cough interfering with sleep two times or less a month Oral steroids no more than once a year No hospitalizations   Allergic Rhinitis: - Improved  - Positive skin test 11/2022: trees, ragweed, mold, cats, dogs, dust mite, mouse - Avoidance measures discussed. - Use nasal saline rinses before nose sprays such as with Neilmed Sinus Rinse.  Use distilled water.   - Use  Flonase 2 sprays each nostril daily. Aim upward and outward. - Use Azelastine 1-2 sprays each nostril twice daily as needed for congestion, runny nose, drainage, sneezing.  Aim upward and outward.   - Use Zyrtec 10 mg daily.  - Use Singulair 10mg  daily. Stop if there are any mood/behavioral changes. 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  - will retest at next visit.  - for SKIN only reaction, okay to take Benadryl 25mg  capsules every 6 hours as needed.  - for SKIN + ANY additional symptoms, OR IF concern for LIFE THREATENING reaction = Epipen Autoinjector EpiPen 0.3 mg. - If using Epinephrine autoinjector, call 911  or go to the ER.        Return in about 3 months (around 06/28/2023).  Alesia Morin, MD [facility]

## 2023-04-12 ENCOUNTER — Encounter: Payer: Self-pay | Admitting: Family Medicine

## 2023-04-12 ENCOUNTER — Other Ambulatory Visit: Payer: Self-pay | Admitting: Family Medicine

## 2023-04-12 DIAGNOSIS — I10 Essential (primary) hypertension: Secondary | ICD-10-CM

## 2023-04-12 DIAGNOSIS — E032 Hypothyroidism due to medicaments and other exogenous substances: Secondary | ICD-10-CM

## 2023-04-12 MED ORDER — LEVOTHYROXINE SODIUM 125 MCG PO TABS
125.0000 ug | ORAL_TABLET | Freq: Every day | ORAL | 0 refills | Status: DC
Start: 2023-04-12 — End: 2023-05-17

## 2023-04-12 NOTE — Telephone Encounter (Signed)
Pt scheduled for 05/17/2023

## 2023-04-12 NOTE — Addendum Note (Signed)
Addended by: Julious Payer D on: 04/12/2023 10:50 AM   Modules accepted: Orders

## 2023-04-12 NOTE — Telephone Encounter (Signed)
Stacks NTBS 30 days given 03/05/23

## 2023-04-12 NOTE — Telephone Encounter (Signed)
Letter sent.

## 2023-04-15 ENCOUNTER — Other Ambulatory Visit: Payer: Self-pay | Admitting: Family Medicine

## 2023-04-15 DIAGNOSIS — I10 Essential (primary) hypertension: Secondary | ICD-10-CM

## 2023-05-03 ENCOUNTER — Encounter: Payer: Self-pay | Admitting: Family

## 2023-05-03 ENCOUNTER — Other Ambulatory Visit (HOSPITAL_COMMUNITY): Payer: Self-pay

## 2023-05-10 DIAGNOSIS — D2271 Melanocytic nevi of right lower limb, including hip: Secondary | ICD-10-CM | POA: Diagnosis not present

## 2023-05-10 DIAGNOSIS — L738 Other specified follicular disorders: Secondary | ICD-10-CM | POA: Diagnosis not present

## 2023-05-10 DIAGNOSIS — L820 Inflamed seborrheic keratosis: Secondary | ICD-10-CM | POA: Diagnosis not present

## 2023-05-10 DIAGNOSIS — D2272 Melanocytic nevi of left lower limb, including hip: Secondary | ICD-10-CM | POA: Diagnosis not present

## 2023-05-10 DIAGNOSIS — L570 Actinic keratosis: Secondary | ICD-10-CM | POA: Diagnosis not present

## 2023-05-10 DIAGNOSIS — Z85820 Personal history of malignant melanoma of skin: Secondary | ICD-10-CM | POA: Diagnosis not present

## 2023-05-16 ENCOUNTER — Telehealth: Payer: Self-pay

## 2023-05-16 NOTE — Telephone Encounter (Signed)
Sarah Garrett (KeyNikki Dom) Rx #: [id number] Need Help? Call us at [phone] Status sent iconSent to Plan today Drug Vascepa 1GM capsules ePA cloud logo Form [employer] [place] [employer] Electronic Request Form Original Claim Info 75

## 2023-05-17 ENCOUNTER — Encounter: Payer: Self-pay | Admitting: Family Medicine

## 2023-05-17 ENCOUNTER — Ambulatory Visit (INDEPENDENT_AMBULATORY_CARE_PROVIDER_SITE_OTHER): Payer: BC Managed Care – PPO | Admitting: Family Medicine

## 2023-05-17 VITALS — BP 139/85 | HR 74 | Temp 97.3°F | Ht 63.0 in | Wt 202.8 lb

## 2023-05-17 DIAGNOSIS — E039 Hypothyroidism, unspecified: Secondary | ICD-10-CM | POA: Diagnosis not present

## 2023-05-17 DIAGNOSIS — E7800 Pure hypercholesterolemia, unspecified: Secondary | ICD-10-CM | POA: Diagnosis not present

## 2023-05-17 DIAGNOSIS — I10 Essential (primary) hypertension: Secondary | ICD-10-CM | POA: Diagnosis not present

## 2023-05-17 MED ORDER — VALACYCLOVIR HCL 1 G PO TABS: 2000.0000 mg | ORAL_TABLET | Freq: Two times a day (BID) | ORAL | 5 refills | Status: AC

## 2023-05-17 MED ORDER — FUROSEMIDE 40 MG PO TABS
40.0000 mg | ORAL_TABLET | Freq: Every day | ORAL | 3 refills | Status: DC | PRN
Start: 2023-05-17 — End: 2024-05-27

## 2023-05-17 MED ORDER — ROSUVASTATIN CALCIUM 20 MG PO TABS
ORAL_TABLET | ORAL | 3 refills | Status: DC
Start: 2023-05-17 — End: 2024-05-27

## 2023-05-17 MED ORDER — ICOSAPENT ETHYL 1 G PO CAPS
1.0000 g | ORAL_CAPSULE | Freq: Two times a day (BID) | ORAL | 3 refills | Status: DC
Start: 2023-05-17 — End: 2023-05-28

## 2023-05-17 MED ORDER — LOSARTAN POTASSIUM-HCTZ 100-12.5 MG PO TABS
1.0000 | ORAL_TABLET | Freq: Every day | ORAL | 3 refills | Status: DC
Start: 2023-05-17 — End: 2024-05-27

## 2023-05-17 MED ORDER — LEVOTHYROXINE SODIUM 125 MCG PO TABS: 125.0000 ug | ORAL_TABLET | Freq: Every day | ORAL | 3 refills | Status: DC

## 2023-05-17 NOTE — Progress Notes (Signed)
Subjective:  Patient ID: SUZZETTE MCQUIRE,  female    DOB: 1961/03/07  Age: 62 y.o.    CC: Medical Management of Chronic Issues   HPI WILETTA COLUM[place]IA presents for  follow-up of hypertension. Patient has no history of headache chest pain or shortness of breath or recent cough. Patient also denies symptoms of TIA such as numbness weakness lateralizing. Patient denies side effects from medication. States taking it regularly.  Patient also  in for follow-up of elevated cholesterol. Doing well without complaints on current medication. Denies side effects  including myalgia and arthralgia and nausea. Also in today for liver function testing. Currently no chest pain, shortness of breath or other cardiovascular related symptoms noted.    follow-up on  thyroid. The patient has a history of hypothyroidism for many years. It has been stable recently. Pt. denies any change in  voice, loss of hair, heat or cold intolerance. Energy level has been adequate to good. Not great due to schedule. "Nonstop." empty stomach. Well tolerated.  Having Alpha Gal test redone in December.     History Harrietta has a past medical history of Anemia, Diverticulosis, GERD (gastroesophageal reflux disease), H/O adenoidectomy, Hemochromatosis (07/07/2011), Hyperlipidemia, Hypertension, Melanoma (HCC), and Thyroid disease.   She has a past surgical history that includes Tonsillectomy and adenoidectomy; Breast biopsy (07/31/2002); Melanoma excision (09/28/2011); Colonoscopy with propofol (N/A, 06/29/2020); and Knee arthroscopy w/ meniscal repair (Right).   Her family history includes Breast cancer (age of onset: 55) in her mother; Heart disease in her mother.She reports that she quit smoking about 45 years ago. Her smoking use included cigarettes. She started smoking about 48 years ago. She has a 0.9 pack-year smoking history. She has been exposed to tobacco smoke. She has never used smokeless tobacco. She reports that she  does not drink alcohol and does not use drugs.  Current Outpatient Medications on File Prior to Visit  Medication Sig Dispense Refill   albuterol (PROVENTIL) (2.5 MG/3ML) 0.083% nebulizer solution Take 3 mLs (2.5 mg total) by nebulization every 6 (six) hours as needed. 75 mL 1   albuterol (VENTOLIN HFA) 108 (90 Base) MCG/ACT inhaler Inhale 1-2 puffs into the lungs every 6 (six) hours as needed for wheezing or shortness of breath. 8 g 1   azelastine (ASTELIN) 0.1 % nasal spray Place 1 spray into both nostrils 2 (two) times daily as needed for allergies or rhinitis. Use in each nostril as directed 30 mL 5   Budeson-Glycopyrrol-Formoterol (BREZTRI AEROSPHERE) 160-9-4.8 MCG/ACT AERO Inhale 2 puffs into the lungs in the morning and at bedtime. 10.7 g 5   cetirizine (ZYRTEC ALLERGY) 10 MG tablet Take 1 tablet (10 mg total) by mouth daily. 30 tablet 5   EPINEPHrine 0.3 mg/0.3 mL IJ SOAJ injection Inject 0.3 mg into the muscle as needed for anaphylaxis. 2 each 1   fluticasone (FLONASE) 50 MCG/ACT nasal spray Place 2 sprays into both nostrils daily. 16 g 5   furosemide (LASIX) 40 MG tablet Take 1 tablet (40 mg total) by mouth daily as needed. 90 tablet 0   hyoscyamine (LEVSIN SL) 0.125 MG SL tablet Place 1 tablet (0.125 mg total) under the tongue as needed. Place 1-2 tablet under the tongue until it dissolves as needed 30 tablet 5   icosapent Ethyl (VASCEPA) 1 g capsule Take 1 capsule (1 g total) by mouth 2 (two) times daily. 180 capsule 3   levothyroxine (SYNTHROID) 125 MCG tablet Take 1 tablet (125 mcg total) by mouth daily.  90 tablet 0   losartan-hydrochlorothiazide (HYZAAR) 100-12.5 MG tablet Take 1 tablet by mouth daily. as directed 90 tablet 3   montelukast (SINGULAIR) 10 MG tablet TAKE 1 TABLET BY MOUTH EVERYDAY AT BEDTIME 90 tablet 1   Multiple Vitamins-Minerals (MULTIVITAMIN WITH MINERALS) tablet Take 1 tablet by mouth daily. One a day woman     potassium chloride (KLOR-CON) 10 MEQ tablet TAKE 1  TABLET (10 MEQ TOTAL) BY MOUTH DAILY. (NEEDS TO BE SEEN BEFORE NEXT REFILL) 90 tablet 1   rosuvastatin (CRESTOR) 20 MG tablet TAKE 1 TABLET BY MOUTH EVERY DAY 90 tablet 3   Spacer/Aero-Holding Chambers DEVI 1 Device by Does not apply route in the morning and at bedtime. 1 each 1   Vitamin D, Ergocalciferol, (DRISDOL) 1.25 MG (50000 UNIT) CAPS capsule TAKE 1 CAPSULE (50,000 UNITS TOTAL) BY MOUTH EVERY 7 (SEVEN) DAYS 13 capsule 3   Current Facility-Administered Medications on File Prior to Visit  Medication Dose Route Frequency Provider Last Rate Last Admin   betamethasone acetate-betamethasone sodium phosphate (CELESTONE) injection 6 mg  6 mg Intramuscular Once Mechele Claude, MD        ROS Review of Systems  Objective:  BP 139/85   Pulse 74   Temp (!) 97.3 F (36.3 C)   Ht 5\' 3"  (1.6 m)   Wt 202 lb 12.8 oz (92 kg)   LMP 11/29/2011   SpO2 94%   BMI 35.92 kg/m   BP Readings from Last 3 Encounters:  05/17/23 139/85  03/28/23 118/72  02/14/23 126/82    Wt Readings from Last 3 Encounters:  05/17/23 202 lb 12.8 oz (92 kg)  02/14/23 200 lb (90.7 kg)  12/19/22 197 lb (89.4 kg)     Physical Exam  Diabetic Foot Exam - Simple   No data filed     No results found for: "HGBA1C"  Assessment & Plan:   Marvel was seen today for medical management of chronic issues.  Diagnoses and all orders for this visit:  Essential hypertension  Acquired hypothyroidism  Pure hypercholesterolemia  Hypothyroidism due to medication   I am having Lonell Grandchild. Hutzler maintain her multivitamin with minerals, hyoscyamine, icosapent Ethyl, losartan-hydrochlorothiazide, rosuvastatin, Vitamin D (Ergocalciferol), potassium chloride, Spacer/Aero-Holding Chambers, EPINEPHrine, albuterol, albuterol, azelastine, Breztri Aerosphere, cetirizine, fluticasone, montelukast, levothyroxine, and furosemide. We will continue to administer betamethasone acetate-betamethasone sodium phosphate.  No orders of  the defined types were placed in this encounter.    Follow-up: No follow-ups on file.  Mechele Claude, M.D.

## 2023-05-17 NOTE — Telephone Encounter (Signed)
Clinical questions answered. PA submitted.

## 2023-05-18 LAB — CBC WITH DIFFERENTIAL/PLATELET
Basophils Absolute: 0.1 10*3/uL (ref 0.0–0.2)
Basos: 1 %
EOS (ABSOLUTE): 0.3 10*3/uL (ref 0.0–0.4)
Eos: 4 %
Hematocrit: 46.1 % (ref 34.0–46.6)
Hemoglobin: 15.7 g/dL (ref 11.1–15.9)
Immature Grans (Abs): 0 10*3/uL (ref 0.0–0.1)
Immature Granulocytes: 0 %
Lymphocytes Absolute: 2.6 10*3/uL (ref 0.7–3.1)
Lymphs: 32 %
MCH: 31 pg (ref 26.6–33.0)
MCHC: 34.1 g/dL (ref 31.5–35.7)
MCV: 91 fL (ref 79–97)
Monocytes Absolute: 0.5 10*3/uL (ref 0.1–0.9)
Monocytes: 6 %
Neutrophils Absolute: 4.5 10*3/uL (ref 1.4–7.0)
Neutrophils: 57 %
Platelets: 180 10*3/uL (ref 150–450)
RBC: 5.07 x10E6/uL (ref 3.77–5.28)
RDW: 13.3 % (ref 11.7–15.4)
WBC: 8 10*3/uL (ref 3.4–10.8)

## 2023-05-18 LAB — CMP14+EGFR
ALT: 28 [IU]/L (ref 0–32)
AST: 32 [IU]/L (ref 0–40)
Albumin: 4.8 g/dL (ref 3.9–4.9)
Alkaline Phosphatase: 88 [IU]/L (ref 44–121)
BUN/Creatinine Ratio: 12 (ref 12–28)
BUN: 10 mg/dL (ref 8–27)
Bilirubin Total: 0.6 mg/dL (ref 0.0–1.2)
CO2: 27 mmol/L (ref 20–29)
Calcium: 9.9 mg/dL (ref 8.7–10.3)
Chloride: 98 mmol/L (ref 96–106)
Creatinine, Ser: 0.84 mg/dL (ref 0.57–1.00)
Globulin, Total: 2.6 g/dL (ref 1.5–4.5)
Glucose: 82 mg/dL (ref 70–99)
Potassium: 3.6 mmol/L (ref 3.5–5.2)
Sodium: 141 mmol/L (ref 134–144)
Total Protein: 7.4 g/dL (ref 6.0–8.5)
eGFR: 78 mL/min/{1.73_m2} (ref >59)

## 2023-05-18 LAB — LIPID PANEL
Chol/HDL Ratio: 2.7 {ratio} (ref 0.0–4.4)
Cholesterol, Total: 156 mg/dL (ref 100–199)
HDL: 58 mg/dL (ref >39)
LDL Chol Calc (NIH): 73 mg/dL (ref 0–99)
Triglycerides: 143 mg/dL (ref 0–149)
VLDL Cholesterol Cal: 25 mg/dL (ref 5–40)

## 2023-05-18 LAB — TSH+FREE T4
Free T4: 1.76 ng/dL (ref 0.82–1.77)
TSH: 1.31 u[IU]/mL (ref 0.450–4.500)

## 2023-05-20 NOTE — Progress Notes (Signed)
Hello Jevon,  Your lab result is normal and/or stable.Some minor variations that are not significant are commonly marked abnormal, but do not represent any medical problem for you.  Best regards, Yahir Tavano, M.D.

## 2023-05-22 DIAGNOSIS — M25562 Pain in left knee: Secondary | ICD-10-CM | POA: Diagnosis not present

## 2023-05-27 ENCOUNTER — Other Ambulatory Visit: Payer: Self-pay | Admitting: Family Medicine

## 2023-05-27 DIAGNOSIS — E7800 Pure hypercholesterolemia, unspecified: Secondary | ICD-10-CM

## 2023-05-28 NOTE — Telephone Encounter (Signed)
Name from pharmacy: ICOSAPENT ETHYL 1 GRAM CAPSULE  Pharmacy comment: Alternative Requested:BOTH BRAND AND GENERIC REQUIRE A PRIOR AUTH; PLEASE CONTACT INSURANCE TO GET APPROVED.

## 2023-05-30 ENCOUNTER — Other Ambulatory Visit (HOSPITAL_COMMUNITY): Payer: Self-pay

## 2023-06-09 DIAGNOSIS — M25562 Pain in left knee: Secondary | ICD-10-CM | POA: Diagnosis not present

## 2023-06-19 ENCOUNTER — Encounter: Payer: Self-pay | Admitting: *Deleted

## 2023-06-19 DIAGNOSIS — M1712 Unilateral primary osteoarthritis, left knee: Secondary | ICD-10-CM | POA: Diagnosis not present

## 2023-06-21 ENCOUNTER — Telehealth: Payer: Self-pay

## 2023-06-21 ENCOUNTER — Other Ambulatory Visit (HOSPITAL_COMMUNITY): Payer: Self-pay

## 2023-06-21 NOTE — Telephone Encounter (Signed)
Pharmacy Patient Advocate Encounter   Received notification from CoverMyMeds that prior authorization for VASCEPA is required/requested.   Insurance verification completed.   The patient is insured through [facility] .   Per test claim: PA required; PA submitted to above mentioned insurance via CoverMyMeds Key/confirmation #/EOC Key: [id number]  Status is pending

## 2023-06-22 NOTE — Telephone Encounter (Signed)
Pharmacy Patient Advocate Encounter  Received notification from [facility] that Prior Authorization for Vascepa has been APPROVED from 11.22.24 to 11.22.25   PA #/Case ID/Reference #: Key: [id number]

## 2023-06-27 ENCOUNTER — Encounter: Payer: Self-pay | Admitting: Internal Medicine

## 2023-06-27 ENCOUNTER — Ambulatory Visit: Payer: BC Managed Care – PPO | Admitting: Internal Medicine

## 2023-06-27 VITALS — BP 128/78 | HR 70 | Temp 98.0°F | Resp 18

## 2023-06-27 DIAGNOSIS — Z91018 Allergy to other foods: Secondary | ICD-10-CM | POA: Diagnosis not present

## 2023-06-27 DIAGNOSIS — J4540 Moderate persistent asthma, uncomplicated: Secondary | ICD-10-CM

## 2023-06-27 DIAGNOSIS — J302 Other seasonal allergic rhinitis: Secondary | ICD-10-CM | POA: Diagnosis not present

## 2023-06-27 DIAGNOSIS — J3089 Other allergic rhinitis: Secondary | ICD-10-CM

## 2023-06-27 MED ORDER — BREZTRI AEROSPHERE 160-9-4.8 MCG/ACT IN AERO: 2.0000 | INHALATION_SPRAY | Freq: Two times a day (BID) | RESPIRATORY_TRACT | 5 refills | Status: DC

## 2023-06-27 MED ORDER — MONTELUKAST SODIUM 10 MG PO TABS: ORAL_TABLET | ORAL | 1 refills | Status: DC

## 2023-06-27 MED ORDER — AZELASTINE HCL 0.1 % NA SOLN: 1.0000 | Freq: Two times a day (BID) | NASAL | 5 refills | Status: DC | PRN

## 2023-06-27 MED ORDER — FLUTICASONE PROPIONATE 50 MCG/ACT NA SUSP: 2.0000 | Freq: Every day | NASAL | 5 refills | Status: DC

## 2023-06-27 MED ORDER — ALBUTEROL SULFATE HFA 108 (90 BASE) MCG/ACT IN AERS: 1.0000 | INHALATION_SPRAY | Freq: Four times a day (QID) | RESPIRATORY_TRACT | 1 refills | Status: DC | PRN

## 2023-06-27 MED ORDER — CETIRIZINE HCL 10 MG PO TABS: 10.0000 mg | ORAL_TABLET | Freq: Every day | ORAL | 5 refills | Status: DC

## 2023-06-27 NOTE — Progress Notes (Signed)
FOLLOW UP Date of Service/Encounter:  06/27/23   Subjective:  Sarah Garrett (DOB: [birth year]) is a 62 y.o. female who [facility] for follow up for alpha gal, allergic rhinitis and asthma.   History obtained from: chart review and patient. Last visit was with me on 03/28/2023  Asthma- controlled on Breztri + Singulair Allergies- Flonase, PRN Azelastine, Zyrtec, Singulair Alpha gal- will plan to recheck at next visit.  Asthma Control Test: ACT Total Score: 21.    Since last visit, reports asthma is doing well.  Has only had ot use albuterol a couple of times since August.  Has also been traveling to [place] without much issues.  Doing well on Breztri BID and Singulair.  No ER visits/oral prednisone since last visit.   Allergies are up and down. Usually does get sick in Winter.  Using Flonase, Zyrtec, Singulair daily; has not needed Azelastine much though.   Still avoiding mammalian meat.  Would like to reintroduce slowly if alpha gal are negative.    Past Medical History: Past Medical History:  Diagnosis Date   Anemia    Diverticulosis    GERD (gastroesophageal reflux disease)    H/O adenoidectomy    Hemochromatosis 07/07/2011   Hyperlipidemia    Hypertension    Melanoma (HCC)    right shoulder   Thyroid disease     Objective:  BP 128/78 (BP Location: Left Arm, Patient Position: Sitting, Cuff Size: Large)   Pulse 70   Temp 98 F (36.7 C) (Temporal)   Resp 18   LMP 11/29/2011   SpO2 97%  There is no height or weight on file to calculate BMI. Physical Exam: GEN: alert, well developed HEENT: clear conjunctiva, nose with mild inferior turbinate hypertrophy, pink nasal mucosa, clear rhinorrhea, no cobblestoning HEART: regular rate and rhythm, no murmur LUNGS: clear to auscultation bilaterally, no coughing, unlabored respiration SKIN: no rashes or lesions  Spirometry:  Tracings reviewed. Her effort: Good  reproducible efforts. FVC: 2.30L, 79% predicted  FEV1: 1.42L, 62% predicted FEV1/FVC ratio: 62% Interpretation: Spirometry consistent with mild obstructive disease.  Please see scanned spirometry results for details.    Assessment:   1. Seasonal and perennial allergic rhinitis   2. Allergy to alpha-gal   3. Moderate persistent asthma without complication     Plan/Recommendations:  Moderate Persistent Asthma - Controlled symptomatically; some obstruction on spirometry today.  MDI teaching done.  Will continue Breztri + Singulair.   - Maintenance inhaler: Continue Breztri 160-9-4.57mcg 2 puffs twice daily with spacer. Continue Singulair 10mg  daily  - Rescue inhaler: Albuterol 2 puffs via spacer or 1 vial via nebulizer every 4-6 hours as needed for respiratory symptoms of cough, shortness of breath, or wheezing Asthma control goals:  Full participation in all desired activities (may need albuterol before activity) Albuterol use two times or less a week on average (not counting use with activity) Cough interfering with sleep two times or less a month Oral steroids no more than once a year No hospitalizations   Allergic Rhinitis: - Controlled  - Positive skin test 11/2022: trees, ragweed, mold, cats, dogs, dust mite, mouse - Avoidance measures discussed. - Use nasal saline rinses before nose sprays such as with Neilmed Sinus Rinse.  Use distilled water.   - Use Flonase 2 sprays each nostril daily. Aim upward and outward. - Use Azelastine 1-2 sprays each nostril twice daily as needed for congestion, runny nose, drainage, sneezing.  Aim upward and  outward.   - Use Zyrtec 10 mg daily.  - Use Singulair 10mg  daily. Stop if there are any mood/behavioral changes. 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 Will recheck labs today.   -  initial rxn: recurrent hives around 2020 that resolved with mammalian meat avoidance  - for SKIN only reaction, okay to take Benadryl 25mg  capsules every 6 hours as needed.  - for SKIN + ANY additional symptoms, OR IF concern for LIFE THREATENING reaction = Epipen Autoinjector EpiPen 0.3 mg. - If using Epinephrine autoinjector, call 911  or go to the ER.       Return in about 3 months (around 09/27/2023).  Alesia Morin, MD [facility]

## 2023-06-27 NOTE — Patient Instructions (Addendum)
Moderate Persistent Asthma - Maintenance inhaler: Continue Breztri 160-9-4.62mcg 2 puffs twice daily with spacer. Continue Singulair 10mg  daily  - Rescue inhaler: Albuterol 2 puffs via spacer or 1 vial via nebulizer every 4-6 hours as needed for respiratory symptoms of cough, shortness of breath, or wheezing Asthma control goals:  Full participation in all desired activities (may need albuterol before activity) Albuterol use two times or less a week on average (not counting use with activity) Cough interfering with sleep two times or less a month Oral steroids no more than once a year No hospitalizations   Allergic Rhinitis: - Positive skin test 11/2022: trees, ragweed, mold, cats, dogs, dust mite, mouse - Avoidance measures discussed. - Use nasal saline rinses before nose sprays such as with Neilmed Sinus Rinse.  Use distilled water.   - Use Flonase 2 sprays each nostril daily. Aim upward and outward. - Use Azelastine 1-2 sprays each nostril twice daily as needed for congestion, runny nose, drainage, sneezing.  Aim upward and outward.   - Use Zyrtec 10 mg daily.  - Use Singulair 10mg  daily. Stop if there are any mood/behavioral changes. 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 Will obtain lab testing today.  - for SKIN only reaction, okay to take Benadryl 25mg  capsules every 6 hours as needed.  - for SKIN + ANY additional symptoms, OR IF concern for LIFE THREATENING reaction = Epipen Autoinjector EpiPen 0.3 mg. - If using Epinephrine autoinjector, call 911  or go to the ER.

## 2023-07-01 LAB — ALPHA-GAL PANEL
Allergen Lamb IgE: 3.27 kU/L — AB
Beef IgE: 1.84 kU/L — AB
IgE (Immunoglobulin E), Serum: 5381 [IU]/mL — ABNORMAL HIGH (ref 6–495)
O215-IgE Alpha-Gal: 0.63 kU/L — AB
Pork IgE: 5.66 kU/L — AB

## 2023-07-11 ENCOUNTER — Other Ambulatory Visit: Payer: Self-pay | Admitting: Family Medicine

## 2023-07-11 DIAGNOSIS — Z1231 Encounter for screening mammogram for malignant neoplasm of breast: Secondary | ICD-10-CM

## 2023-07-26 ENCOUNTER — Other Ambulatory Visit: Payer: Self-pay | Admitting: Family Medicine

## 2023-08-09 ENCOUNTER — Ambulatory Visit
Admission: RE | Admit: 2023-08-09 | Discharge: 2023-08-09 | Disposition: A | Discharge status: Home / Self Care | Payer: BC Managed Care – PPO | Source: Ambulatory Visit | Attending: Family Medicine | Admitting: Family Medicine

## 2023-08-09 ENCOUNTER — Encounter: Payer: Self-pay | Admitting: Family

## 2023-08-09 DIAGNOSIS — Z1231 Encounter for screening mammogram for malignant neoplasm of breast: Secondary | ICD-10-CM

## 2023-09-06 ENCOUNTER — Encounter: Payer: Self-pay | Admitting: Family

## 2023-09-21 ENCOUNTER — Other Ambulatory Visit: Payer: Self-pay

## 2023-09-21 ENCOUNTER — Encounter: Payer: Self-pay | Admitting: Internal Medicine

## 2023-09-21 ENCOUNTER — Ambulatory Visit: Payer: BC Managed Care – PPO | Admitting: Internal Medicine

## 2023-09-21 VITALS — BP 124/78 | HR 74 | Temp 98.3°F | Wt 203.5 lb

## 2023-09-21 DIAGNOSIS — Z91018 Allergy to other foods: Secondary | ICD-10-CM

## 2023-09-21 DIAGNOSIS — K219 Gastro-esophageal reflux disease without esophagitis: Secondary | ICD-10-CM

## 2023-09-21 DIAGNOSIS — J4540 Moderate persistent asthma, uncomplicated: Secondary | ICD-10-CM

## 2023-09-21 DIAGNOSIS — J3089 Other allergic rhinitis: Secondary | ICD-10-CM | POA: Diagnosis not present

## 2023-09-21 DIAGNOSIS — J302 Other seasonal allergic rhinitis: Secondary | ICD-10-CM

## 2023-09-21 MED ORDER — MONTELUKAST SODIUM 10 MG PO TABS: ORAL_TABLET | ORAL | 1 refills | Status: DC

## 2023-09-21 MED ORDER — FLUTICASONE PROPIONATE 50 MCG/ACT NA SUSP: 2.0000 | Freq: Every day | NASAL | 5 refills | Status: DC

## 2023-09-21 MED ORDER — AZELASTINE HCL 0.1 % NA SOLN: 1.0000 | Freq: Two times a day (BID) | NASAL | 5 refills | Status: DC | PRN

## 2023-09-21 MED ORDER — ESOMEPRAZOLE MAGNESIUM 20 MG PO CPDR: 20.0000 mg | DELAYED_RELEASE_CAPSULE | Freq: Every day | ORAL | 3 refills | Status: DC

## 2023-09-21 MED ORDER — ALBUTEROL SULFATE (2.5 MG/3ML) 0.083% IN NEBU: 2.5000 mg | INHALATION_SOLUTION | Freq: Four times a day (QID) | RESPIRATORY_TRACT | 1 refills | Status: DC | PRN

## 2023-09-21 MED ORDER — CETIRIZINE HCL 10 MG PO TABS: 10.0000 mg | ORAL_TABLET | Freq: Every day | ORAL | 5 refills | Status: DC

## 2023-09-21 MED ORDER — ALBUTEROL SULFATE HFA 108 (90 BASE) MCG/ACT IN AERS
1.0000 | INHALATION_SPRAY | Freq: Four times a day (QID) | RESPIRATORY_TRACT | 1 refills | Status: DC | PRN
Start: 2023-09-21 — End: 2023-11-20

## 2023-09-21 MED ORDER — BREZTRI AEROSPHERE 160-9-4.8 MCG/ACT IN AERO: 2.0000 | INHALATION_SPRAY | Freq: Two times a day (BID) | RESPIRATORY_TRACT | 5 refills | Status: DC

## 2023-09-21 NOTE — Progress Notes (Signed)
 FOLLOW UP Date of Service/Encounter:  09/21/23   Subjective:  Sarah Garrett (DOB: 10-15-1960) is a 63 y.o. female who [facility] for follow up for alpha gal, allergic rhinitis and asthma. Also with GERD.   History obtained from: chart review and patient. Last visit was on 06/27/2023 and at the time was well controlled on Breztri, Singulair, Flonase, Azelastine PRN, Zyrtec.    Since last visit, reports asthma was doing very well until January and had 3 weeks of coughing that then resolved on its own.  Not much dyspnea/wheezing.  Did note some increased post nasal drip and worsening raspy voice since then.  Did not have to go to the ER/urgent care/need oral prednisone.  Using Breztri BID, has not needed Albuterol since that flare up in January.    Also has noted worsening reflux with multiple episodes a week of indigestion and heartburn.  Taking Nexium PRN rather than daily.  Last EGD was in 2002.   For sinuses, not much congestion but just post nasal drip and worsening raspy voice.  Using Flonase, Zyrtec, Singulair daily.  Has not been taking Azelastine.     Past Medical History: Past Medical History:  Diagnosis Date   Anemia    Diverticulosis    GERD (gastroesophageal reflux disease)    H/O adenoidectomy    Hemochromatosis 07/07/2011   Hyperlipidemia    Hypertension    Melanoma (HCC)    right shoulder   Thyroid disease     Objective:  BP 124/78 (BP Location: Left Arm, Patient Position: Sitting, Cuff Size: Large)   Pulse 74   Temp 98.3 F (36.8 C) (Temporal)   Wt 203 lb 8 oz (92.3 kg)   LMP 11/29/2011   SpO2 94%   BMI 36.05 kg/m  Body mass index is 36.05 kg/m. Physical Exam: GEN: alert, well developed HEENT: clear conjunctiva, nose with mild inferior turbinate hypertrophy, pink nasal mucosa, no rhinorrhea, + cobblestoning HEART: regular rate and rhythm, no murmur LUNGS: clear to auscultation bilaterally, no coughing,  unlabored respiration SKIN: no rashes or lesions  Spirometry:  Tracings reviewed. Her effort: Good reproducible efforts. FVC: 2.42L, 83% predicted  FEV1: 1.5L, 66% predicted FEV1/FVC ratio: 62% Interpretation: Spirometry consistent with mild obstructive disease.  Please see scanned spirometry results for details.  Assessment:   1. Seasonal and perennial allergic rhinitis   2. Gastroesophageal reflux disease, unspecified whether esophagitis present   3. Allergy to alpha-gal   4. Moderate persistent asthma without complication     Plan/Recommendations:  Moderate Persistent Asthma - Recent flare up with cough, discussed possibly related to uncontrolled rhinitis and GERD; will target that as discussed below. Keep track of symptoms and rescue inhaler use; can consider a biologic.  Spirometry with obstruction but stable. MDI technique discussed.  - Maintenance inhaler: Continue Breztri 160-9-4.88mcg 2 puffs twice daily with spacer. Continue Singulair 10mg  daily  - Rescue inhaler: Albuterol 2 puffs via spacer or 1 vial via nebulizer every 4-6 hours as needed for respiratory symptoms of cough, shortness of breath, or wheezing Asthma control goals:  Full participation in all desired activities (may need albuterol before activity) Albuterol use two times or less a week on average (not counting use with activity) Cough interfering with sleep two times or less a month Oral steroids no more than once a year No hospitalizations   Allergic Rhinitis: - Uncontrolled, add Azelastine. Consider AIT.  Will consider ENT referral if raspy voice continues to worsen.  -  Positive skin test 11/2022: trees, ragweed, mold, cats, dogs, dust mite, mouse - Avoidance measures discussed. - Use nasal saline rinses before nose sprays such as with Neilmed Sinus Rinse.  Use distilled water.   - Use Flonase 2 sprays each nostril daily. Aim upward and outward. - Use Azelastine 1-2 sprays each nostril twice daily as  needed for congestion, runny nose, drainage, sneezing.  Aim upward and outward.   - Use Zyrtec 10 mg daily.  - Use Singulair 10mg  daily. Stop if there are any mood/behavioral changes. - Consider allergy shots as long term control of your symptoms by teaching your immune system to be more tolerant of your allergy triggers.  GERD - Uncontrolled  - Restart Nexium 20mg  daily for 2 weeks.  If symptoms improve, can go back to as needed after. Take on empty stomach in the morning.   -Avoid lying down for at least two hours after a meal or after drinking acidic beverages, like soda, or other caffeinated beverages. This can help to prevent stomach contents from flowing back into the esophagus. -Keep your head elevated while you sleep. Using an extra pillow or two can also help to prevent reflux. -Eat smaller and more frequent meals each day instead of a few large meals. This promotes digestion and can aid in preventing heartburn. -Wear loose-fitting clothes to ease pressure on the stomach, which can worsen heartburn and reflux. -Reduce excess weight around the midsection. This can ease pressure on the stomach. Such pressure can force some stomach contents back up the esophagus.   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  - initial rxn: recurrent hives around 2020 that resolved with mammalian meat avoidance  - sIgE 06/2023: alpha gal 0.63 - for SKIN only reaction, okay to take Benadryl 25mg  capsules every 6 hours as needed.  - for SKIN + ANY additional symptoms, OR IF concern for LIFE THREATENING reaction = Epipen Autoinjector EpiPen 0.3 mg. - If using Epinephrine autoinjector, call 911  or go to the ER.      Return in about 2 months (around 11/19/2023).  Alesia Morin, MD [facility]

## 2023-09-21 NOTE — Patient Instructions (Addendum)
 Moderate Persistent Asthma - Maintenance inhaler: Continue Breztri 160-9-4.19mcg 2 puffs twice daily with spacer. Continue Singulair 10mg  daily  - Rescue inhaler: Albuterol 2 puffs via spacer or 1 vial via nebulizer every 4-6 hours as needed for respiratory symptoms of cough, shortness of breath, or wheezing Asthma control goals:  Full participation in all desired activities (may need albuterol before activity) Albuterol use two times or less a week on average (not counting use with activity) Cough interfering with sleep two times or less a month Oral steroids no more than once a year No hospitalizations   Allergic Rhinitis: - Positive skin test 11/2022: trees, ragweed, mold, cats, dogs, dust mite, mouse - Avoidance measures discussed. - Use nasal saline rinses before nose sprays such as with Neilmed Sinus Rinse.  Use distilled water.   - Use Flonase 2 sprays each nostril daily. Aim upward and outward. - Use Azelastine 1-2 sprays each nostril twice daily as needed for congestion, runny nose, drainage, sneezing.  Aim upward and outward.   - Use Zyrtec 10 mg daily.  - Use Singulair 10mg  daily. Stop if there are any mood/behavioral changes. - Consider allergy shots as long term control of your symptoms by teaching your immune system to be more tolerant of your allergy triggers.  GERD - Restart Nexium 20mg  daily for 2 weeks.  If symptoms improve, can go back to as needed after. Take on empty stomach in the morning.   -Avoid lying down for at least two hours after a meal or after drinking acidic beverages, like soda, or other caffeinated beverages. This can help to prevent stomach contents from flowing back into the esophagus. -Keep your head elevated while you sleep. Using an extra pillow or two can also help to prevent reflux. -Eat smaller and more frequent meals each day instead of a few large meals. This promotes digestion and can aid in preventing heartburn. -Wear loose-fitting clothes to  ease pressure on the stomach, which can worsen heartburn and reflux. -Reduce excess weight around the midsection. This can ease pressure on the stomach. Such pressure can force some stomach contents back up the esophagus.   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  - for SKIN only reaction, okay to take Benadryl 25mg  capsules every 6 hours as needed.  - for SKIN + ANY additional symptoms, OR IF concern for LIFE THREATENING reaction = Epipen Autoinjector EpiPen 0.3 mg. - If using Epinephrine autoinjector, call 911  or go to the ER.

## 2023-10-26 ENCOUNTER — Other Ambulatory Visit: Payer: Self-pay | Admitting: Family Medicine

## 2023-10-26 DIAGNOSIS — E559 Vitamin D deficiency, unspecified: Secondary | ICD-10-CM

## 2023-11-06 ENCOUNTER — Encounter: Payer: Self-pay | Admitting: Family

## 2023-11-15 ENCOUNTER — Encounter: Payer: Self-pay | Admitting: Family

## 2023-11-15 ENCOUNTER — Ambulatory Visit (INDEPENDENT_AMBULATORY_CARE_PROVIDER_SITE_OTHER): Payer: BC Managed Care – PPO | Admitting: Family Medicine

## 2023-11-15 ENCOUNTER — Encounter: Payer: Self-pay | Admitting: Family Medicine

## 2023-11-15 VITALS — BP 136/90 | HR 80 | Temp 97.9°F | Ht 63.0 in | Wt 202.0 lb

## 2023-11-15 DIAGNOSIS — J45909 Unspecified asthma, uncomplicated: Secondary | ICD-10-CM | POA: Diagnosis not present

## 2023-11-15 DIAGNOSIS — M545 Low back pain, unspecified: Secondary | ICD-10-CM | POA: Diagnosis not present

## 2023-11-15 DIAGNOSIS — I10 Essential (primary) hypertension: Secondary | ICD-10-CM

## 2023-11-15 DIAGNOSIS — R350 Frequency of micturition: Secondary | ICD-10-CM

## 2023-11-15 DIAGNOSIS — E7800 Pure hypercholesterolemia, unspecified: Secondary | ICD-10-CM | POA: Diagnosis not present

## 2023-11-15 DIAGNOSIS — E039 Hypothyroidism, unspecified: Secondary | ICD-10-CM

## 2023-11-15 LAB — MICROSCOPIC EXAMINATION
Bacteria, UA: NONE SEEN
RBC, Urine: NONE SEEN /HPF (ref 0–2)
Renal Epithel, UA: NONE SEEN /HPF

## 2023-11-15 LAB — URINALYSIS, ROUTINE W REFLEX MICROSCOPIC
Bilirubin, UA: NEGATIVE
Glucose, UA: NEGATIVE
Ketones, UA: NEGATIVE
Nitrite, UA: NEGATIVE
Protein,UA: NEGATIVE
RBC, UA: NEGATIVE
Specific Gravity, UA: 1.015 (ref 1.005–1.030)
Urobilinogen, Ur: 0.2 mg/dL (ref 0.2–1.0)
pH, UA: 6.5 (ref 5.0–7.5)

## 2023-11-15 MED ORDER — POTASSIUM CHLORIDE ER 10 MEQ PO TBCR
10.0000 meq | EXTENDED_RELEASE_TABLET | Freq: Every day | ORAL | 1 refills | Status: DC
Start: 2023-11-15 — End: 2024-04-04

## 2023-11-15 MED ORDER — AMOXICILLIN-POT CLAVULANATE 875-125 MG PO TABS: 1.0000 | ORAL_TABLET | Freq: Two times a day (BID) | ORAL | 0 refills | Status: DC

## 2023-11-15 NOTE — Progress Notes (Signed)
 Subjective:  Patient ID: Sarah Garrett, female    DOB: 15-Mar-1961  Age: 63 y.o. MRN: [id number]  CC: Back Pain (Lower to mid back pain off and on for a couple weeks. Occasional dark urine. Frequent urination but on fluid pill. )   HPI Sarah Garrett presents for  follow-up of hypertension. Patient has no history of headache chest pain or shortness of breath or recent cough. Patient also denies symptoms of TIA such as focal numbness or weakness. Patient denies side effects from medication. States taking it regularly.  hAS A NEW ALLERGY  DR. Considering allergy  shots.    follow-up on  thyroid . The patient has a history of hypothyroidism for many years. It has been stable recently. Pt. denies loss of hair, heat  intolerance. Some Cold intolerance intermittently. Energy level has been adequate to good. Patient denies constipation and diarrhea. No myxedema. Medication is as noted below. Verified that pt is taking it daily on an empty stomach. Well tolerated.Voice is raspy for several years.     History Sarah Garrett has a past medical history of Anemia, Diverticulosis, GERD (gastroesophageal reflux disease), H/O adenoidectomy, Hemochromatosis (07/07/2011), Hyperlipidemia, Hypertension, Melanoma (HCC), and Thyroid  disease.   Sarah Garrett has a past surgical history that includes Tonsillectomy and adenoidectomy; Breast biopsy (07/31/2002); Melanoma excision (09/28/2011); Colonoscopy with propofol  (N/A, 06/29/2020); and Knee arthroscopy w/ meniscal repair (Right).   Her family history includes Breast cancer (age of onset: 68) in her mother; Heart disease in her mother.Sarah Garrett reports that Sarah Garrett quit smoking about 45 years ago. Her smoking use included cigarettes. Sarah Garrett started smoking about 49 years ago. Sarah Garrett has a 0.9 pack-year smoking history. Sarah Garrett has been exposed to tobacco smoke. Sarah Garrett has never used smokeless tobacco. Sarah Garrett reports that Sarah Garrett does not drink alcohol  and does not use drugs.  Current Outpatient Medications  on File Prior to Visit  Medication Sig Dispense Refill   albuterol  (PROVENTIL ) (2.5 MG/3ML) 0.083% nebulizer solution Take 3 mLs (2.5 mg total) by nebulization every 6 (six) hours as needed. 75 mL 1   albuterol  (VENTOLIN  HFA) 108 (90 Base) MCG/ACT inhaler Inhale 1-2 puffs into the lungs every 6 (six) hours as needed for wheezing or shortness of breath. 8 g 1   azelastine  (ASTELIN ) 0.1 % nasal spray Place 1 spray into both nostrils 2 (two) times daily as needed for allergies or rhinitis. Use in each nostril as directed 30 mL 5   Budeson-Glycopyrrol-Formoterol  (BREZTRI  AEROSPHERE) 160-9-4.8 MCG/ACT AERO Inhale 2 puffs into the lungs in the morning and at bedtime. 10.7 g 5   cetirizine  (ZYRTEC  ALLERGY ) 10 MG tablet Take 1 tablet (10 mg total) by mouth daily. 30 tablet 5   EPINEPHrine  0.3 mg/0.3 mL IJ SOAJ injection Inject 0.3 mg into the muscle as needed for anaphylaxis. 2 each 1   esomeprazole  (NEXIUM ) 20 MG capsule Take 1 capsule (20 mg total) by mouth daily at 12 noon. 30 capsule 3   fluticasone  (FLONASE ) 50 MCG/ACT nasal spray Place 2 sprays into both nostrils daily. 16 g 5   furosemide  (LASIX ) 40 MG tablet Take 1 tablet (40 mg total) by mouth daily as needed. 90 tablet 3   hyoscyamine  (LEVSIN SL) 0.125 MG SL tablet Place 1 tablet (0.125 mg total) under the tongue as needed. Place 1-2 tablet under the tongue until it dissolves as needed 30 tablet 5   icosapent  Ethyl (VASCEPA ) 1 g capsule TAKE 1 CAPSULE BY MOUTH 2 TIMES DAILY. 180 capsule 3   levothyroxine  (SYNTHROID ) 125 MCG tablet  Take 1 tablet (125 mcg total) by mouth daily. 90 tablet 3   losartan -hydrochlorothiazide (HYZAAR ) 100-12.5 MG tablet Take 1 tablet by mouth daily. as directed 90 tablet 3   montelukast  (SINGULAIR ) 10 MG tablet TAKE 1 TABLET BY MOUTH EVERYDAY AT BEDTIME 90 tablet 1   Multiple Vitamins-Minerals (MULTIVITAMIN WITH MINERALS) tablet Take 1 tablet by mouth daily. One a day woman     rosuvastatin  (CRESTOR ) 20 MG tablet TAKE 1  TABLET BY MOUTH EVERY DAY 90 tablet 3   Spacer/Aero-Holding Chambers DEVI 1 Device by Does not apply route in the morning and at bedtime. 1 each 1   Vitamin D , Ergocalciferol , (DRISDOL ) 1.25 MG (50000 UNIT) CAPS capsule TAKE 1 CAPSULE (50,000 UNITS TOTAL) BY MOUTH EVERY 7 (SEVEN) DAYS 13 capsule 3   Current Facility-Administered Medications on File Prior to Visit  Medication Dose Route Frequency Provider Last Rate Last Admin   betamethasone  acetate-betamethasone  sodium phosphate (CELESTONE ) injection 6 mg  6 mg Intramuscular Once Evo Aderman, MD        ROS Review of Systems  Constitutional:  Negative for fever.  HENT:  Positive for congestion. Negative for rhinorrhea and sore throat.   Respiratory:  Negative for cough and shortness of breath.   Cardiovascular:  Negative for chest pain and palpitations.  Gastrointestinal:  Negative for abdominal pain.  Musculoskeletal:  Positive for back pain. Negative for arthralgias and myalgias.    Objective:  BP (!) 136/90   Pulse 80   Temp 97.9 F (36.6 C)   Ht 5\' 3"  (1.6 m)   Wt 202 lb (91.6 kg)   LMP 11/29/2011   SpO2 93%   BMI 35.78 kg/m   BP Readings from Last 3 Encounters:  11/15/23 (!) 136/90  09/21/23 124/78  06/27/23 128/78    Wt Readings from Last 3 Encounters:  11/15/23 202 lb (91.6 kg)  09/21/23 203 lb 8 oz (92.3 kg)  05/17/23 202 lb 12.8 oz (92 kg)     Physical Exam Constitutional:      General: Sarah Garrett is not in acute distress.    Appearance: Sarah Garrett is well-developed.  Cardiovascular:     Rate and Rhythm: Normal rate and regular rhythm.  Pulmonary:     Breath sounds: Normal breath sounds.  Musculoskeletal:        General: Normal range of motion.  Skin:    General: Skin is warm and dry.  Neurological:     Mental Status: Sarah Garrett is alert and oriented to person, place, and time.       Assessment & Plan:  Low back pain without sciatica, unspecified back pain laterality, unspecified chronicity -     Urinalysis,  Routine w reflex microscopic -     Urine Culture -     CBC with Differential/Platelet -     CMP14+EGFR  Frequent urination -     Urinalysis, Routine w reflex microscopic -     Urine Culture -     CBC with Differential/Platelet -     CMP14+EGFR  Uncomplicated asthma, unspecified asthma severity, unspecified whether persistent -     CBC with Differential/Platelet -     CMP14+EGFR  Pure hypercholesterolemia -     CBC with Differential/Platelet -     CMP14+EGFR -     Lipid panel  Acquired hypothyroidism -     TSH + free T4 -     CBC with Differential/Platelet -     CMP14+EGFR  Essential hypertension -     CBC with  Differential/Platelet -     CMP14+EGFR  Other orders -     Potassium Chloride  ER; Take 1 tablet (10 mEq total) by mouth daily.  Dispense: 90 tablet; Refill: 1 -     Amoxicillin -Pot Clavulanate; Take 1 tablet by mouth 2 (two) times daily. Take all of this medication  Dispense: 20 tablet; Refill: 0 -     Microscopic Examination    Allergies as of 11/15/2023       Reactions   Aspirin Swelling   Swelling of mouth and throat   Ace Inhibitors Other (See Comments)   Per [id number]   Actos [pioglitazone Hydrochloride] Other (See Comments)   Per pt: unknown   Alpha-gal Other (See Comments)   unknown   Ciprofloxacin  Other (See Comments)   Unknown reaction   Crestor  [rosuvastatin  Calcium ] Other (See Comments)   Per pt: unknown   Pneumococcal Vaccines Rash   Sulfonamide Derivatives Other (See Comments)   Swelling of mouth and throat        Medication List        Accurate as of November 15, 2023 11:59 PM. If you have any questions, ask your nurse or doctor.          albuterol  108 (90 Base) MCG/ACT inhaler Commonly known as: Ventolin  HFA Inhale 1-2 puffs into the lungs every 6 (six) hours as needed for wheezing or shortness of breath.   albuterol  (2.5 MG/3ML) 0.083% nebulizer solution Commonly known as: PROVENTIL  Take 3 mLs (2.5 mg total) by nebulization  every 6 (six) hours as needed.   amoxicillin -clavulanate 875-125 MG tablet Commonly known as: AUGMENTIN  Take 1 tablet by mouth 2 (two) times daily. Take all of this medication Started by: Vallorie Gayer Amaad Byers   azelastine  0.1 % nasal spray Commonly known as: ASTELIN  Place 1 spray into both nostrils 2 (two) times daily as needed for allergies or rhinitis. Use in each nostril as directed   Breztri  Aerosphere 160-9-4.8 MCG/ACT Aero inhaler Generic drug: budeson-glycopyrrolate-formoterol  Inhale 2 puffs into the lungs in the morning and at bedtime.   cetirizine  10 MG tablet Commonly known as: ZyrTEC  Allergy  Take 1 tablet (10 mg total) by mouth daily.   EPINEPHrine  0.3 mg/0.3 mL Soaj injection Commonly known as: EPI-PEN Inject 0.3 mg into the muscle as needed for anaphylaxis.   esomeprazole  20 MG capsule Commonly known as: NEXIUM  Take 1 capsule (20 mg total) by mouth daily at 12 noon.   fluticasone  50 MCG/ACT nasal spray Commonly known as: FLONASE  Place 2 sprays into both nostrils daily.   furosemide  40 MG tablet Commonly known as: LASIX  Take 1 tablet (40 mg total) by mouth daily as needed.   hyoscyamine  0.125 MG SL tablet Commonly known as: LEVSIN SL Place 1 tablet (0.125 mg total) under the tongue as needed. Place 1-2 tablet under the tongue until it dissolves as needed   icosapent  Ethyl 1 g capsule Commonly known as: VASCEPA  TAKE 1 CAPSULE BY MOUTH 2 TIMES DAILY.   levothyroxine  125 MCG tablet Commonly known as: SYNTHROID  Take 1 tablet (125 mcg total) by mouth daily.   losartan -hydrochlorothiazide 100-12.5 MG tablet Commonly known as: HYZAAR  Take 1 tablet by mouth daily. as directed   montelukast  10 MG tablet Commonly known as: SINGULAIR  TAKE 1 TABLET BY MOUTH EVERYDAY AT BEDTIME   multivitamin with minerals tablet Take 1 tablet by mouth daily. One a day woman   potassium chloride  10 MEQ tablet Commonly known as: KLOR-CON  Take 1 tablet (10 mEq total) by mouth  daily.  rosuvastatin  20 MG tablet Commonly known as: CRESTOR  TAKE 1 TABLET BY MOUTH EVERY DAY   Spacer/Aero-Holding Ismael Maria 1 Device by Does not apply route in the morning and at bedtime.   Vitamin D  (Ergocalciferol ) 1.25 MG (50000 UNIT) Caps capsule Commonly known as: DRISDOL  TAKE 1 CAPSULE (50,000 UNITS TOTAL) BY MOUTH EVERY 7 (SEVEN) DAYS         Follow-up: Return in about 6 months (around 05/16/2024).  Sarah Garrett, M.D.

## 2023-11-15 NOTE — Patient Instructions (Signed)

## 2023-11-16 LAB — CMP14+EGFR
ALT: 24 IU/L (ref 0–32)
AST: 27 IU/L (ref 0–40)
Albumin: 4.7 g/dL (ref 3.9–4.9)
Alkaline Phosphatase: 94 IU/L (ref 44–121)
BUN/Creatinine Ratio: 16 (ref 12–28)
BUN: 12 mg/dL (ref 8–27)
Bilirubin Total: 0.6 mg/dL (ref 0.0–1.2)
CO2: 26 mmol/L (ref 20–29)
Calcium: 10.6 mg/dL — ABNORMAL HIGH (ref 8.7–10.3)
Chloride: 99 mmol/L (ref 96–106)
Creatinine, Ser: 0.76 mg/dL (ref 0.57–1.00)
Globulin, Total: 2.9 g/dL (ref 1.5–4.5)
Glucose: 89 mg/dL (ref 70–99)
Potassium: 3.5 mmol/L (ref 3.5–5.2)
Sodium: 142 mmol/L (ref 134–144)
Total Protein: 7.6 g/dL (ref 6.0–8.5)
eGFR: 87 mL/min/{1.73_m2} (ref >59)

## 2023-11-16 LAB — CBC WITH DIFFERENTIAL/PLATELET
Basophils Absolute: 0.1 10*3/uL (ref 0.0–0.2)
Basos: 1 %
EOS (ABSOLUTE): 0.2 10*3/uL (ref 0.0–0.4)
Eos: 2 %
Hematocrit: 44.8 % (ref 34.0–46.6)
Hemoglobin: 15.3 g/dL (ref 11.1–15.9)
Immature Grans (Abs): 0 10*3/uL (ref 0.0–0.1)
Immature Granulocytes: 0 %
Lymphocytes Absolute: 3.1 10*3/uL (ref 0.7–3.1)
Lymphs: 35 %
MCH: 31.2 pg (ref 26.6–33.0)
MCHC: 34.2 g/dL (ref 31.5–35.7)
MCV: 91 fL (ref 79–97)
Monocytes Absolute: 0.6 10*3/uL (ref 0.1–0.9)
Monocytes: 6 %
Neutrophils Absolute: 4.9 10*3/uL (ref 1.4–7.0)
Neutrophils: 56 %
Platelets: 227 10*3/uL (ref 150–450)
RBC: 4.9 x10E6/uL (ref 3.77–5.28)
RDW: 13.2 % (ref 11.7–15.4)
WBC: 8.8 10*3/uL (ref 3.4–10.8)

## 2023-11-16 LAB — LIPID PANEL
Chol/HDL Ratio: 3 ratio (ref 0.0–4.4)
Cholesterol, Total: 147 mg/dL (ref 100–199)
HDL: 49 mg/dL (ref >39)
LDL Chol Calc (NIH): 72 mg/dL (ref 0–99)
Triglycerides: 150 mg/dL — ABNORMAL HIGH (ref 0–149)
VLDL Cholesterol Cal: 26 mg/dL (ref 5–40)

## 2023-11-16 LAB — TSH+FREE T4
Free T4: 1.85 ng/dL — ABNORMAL HIGH (ref 0.82–1.77)
TSH: 0.504 u[IU]/mL (ref 0.450–4.500)

## 2023-11-17 LAB — URINE CULTURE

## 2023-11-19 ENCOUNTER — Telehealth: Payer: Self-pay

## 2023-11-19 ENCOUNTER — Other Ambulatory Visit: Payer: Self-pay | Admitting: Family Medicine

## 2023-11-19 MED ORDER — CEFPROZIL 500 MG PO TABS: 500.0000 mg | ORAL_TABLET | Freq: Two times a day (BID) | ORAL | 0 refills | Status: DC

## 2023-11-19 NOTE — Telephone Encounter (Signed)
 Lm on vm on 4/12 regarding new medication and lab results. 4/21 LS

## 2023-11-19 NOTE — Telephone Encounter (Signed)
 Please let the patient know that I sent their prescription to their pharmacy. Thanks, WS

## 2023-11-19 NOTE — Telephone Encounter (Signed)
 Copied from CRM [id number]. Topic: Clinical - Prescription Issue >> Nov 19, 2023 10:46 AM Carlatta H wrote: Reason for CRM: Patient called to advised that prescription amoxicillin -clavulanate (AUGMENTIN ) 875-125 MG tablet [[id number]] made her sick//Please call patient at [phone] for a different medication

## 2023-11-20 ENCOUNTER — Other Ambulatory Visit: Payer: Self-pay

## 2023-11-20 ENCOUNTER — Ambulatory Visit (INDEPENDENT_AMBULATORY_CARE_PROVIDER_SITE_OTHER): Payer: BC Managed Care – PPO | Admitting: Internal Medicine

## 2023-11-20 ENCOUNTER — Encounter: Payer: Self-pay | Admitting: Internal Medicine

## 2023-11-20 VITALS — BP 124/88 | HR 76 | Temp 98.1°F | Wt 202.4 lb

## 2023-11-20 DIAGNOSIS — K219 Gastro-esophageal reflux disease without esophagitis: Secondary | ICD-10-CM | POA: Diagnosis not present

## 2023-11-20 DIAGNOSIS — J302 Other seasonal allergic rhinitis: Secondary | ICD-10-CM

## 2023-11-20 DIAGNOSIS — J3089 Other allergic rhinitis: Secondary | ICD-10-CM

## 2023-11-20 DIAGNOSIS — R499 Unspecified voice and resonance disorder: Secondary | ICD-10-CM

## 2023-11-20 DIAGNOSIS — J4540 Moderate persistent asthma, uncomplicated: Secondary | ICD-10-CM

## 2023-11-20 MED ORDER — EPINEPHRINE 0.3 MG/0.3ML IJ SOAJ: 0.3000 mg | INTRAMUSCULAR | 1 refills | Status: DC | PRN

## 2023-11-20 MED ORDER — CETIRIZINE HCL 10 MG PO TABS: 10.0000 mg | ORAL_TABLET | Freq: Every day | ORAL | 5 refills | Status: DC

## 2023-11-20 MED ORDER — BREZTRI AEROSPHERE 160-9-4.8 MCG/ACT IN AERO: 2.0000 | INHALATION_SPRAY | Freq: Two times a day (BID) | RESPIRATORY_TRACT | 5 refills | Status: DC

## 2023-11-20 MED ORDER — AZELASTINE HCL 0.1 % NA SOLN: 1.0000 | Freq: Two times a day (BID) | NASAL | 5 refills | Status: DC | PRN

## 2023-11-20 MED ORDER — ALBUTEROL SULFATE HFA 108 (90 BASE) MCG/ACT IN AERS: 1.0000 | INHALATION_SPRAY | Freq: Four times a day (QID) | RESPIRATORY_TRACT | 1 refills | Status: DC | PRN

## 2023-11-20 MED ORDER — FLUTICASONE PROPIONATE 50 MCG/ACT NA SUSP: 2.0000 | Freq: Every day | NASAL | 5 refills | Status: DC

## 2023-11-20 MED ORDER — MONTELUKAST SODIUM 10 MG PO TABS: ORAL_TABLET | ORAL | 1 refills | Status: DC

## 2023-11-20 NOTE — Progress Notes (Signed)
 FOLLOW UP Date of Service/Encounter:  11/20/23   Subjective:  Sarah Garrett (DOB: 05-Mar-1961) is a 63 y.o. female who [facility] for follow up for alpha gal, allergic rhinitis, asthma, GERD.  History obtained from: chart review and patient. Last visit was with me on 09/21/2023  Asthma/GERD- recent flare up of cough, thought to be GERD related, discussed restarting PPI.  Continue breztri /singulair   AR- uncontrolled, added Azelastine , consider AIT  Alpha gal- avoiding mammalian meat  Reports doing better since last visit.  Not much trouble with cough/wheezing..  Only has had to use Albuterol  twice since last visit, when pollen counts were high. No ER/urgent care/prednisone  use since last visit.  Allergies are doing okay. Uses Zyrtec  and Singulair  daily, nose sprays PRN. Still with raspy voice, no improvement.   Reflux is controlled on Nexium  daily; has not been able to stop it as GERD worsens.   Past Medical History: Past Medical History:  Diagnosis Date   Anemia    Diverticulosis    GERD (gastroesophageal reflux disease)    H/O adenoidectomy    Hemochromatosis 07/07/2011   Hyperlipidemia    Hypertension    Melanoma (HCC)    right shoulder   Thyroid  disease     Objective:  BP 124/88 (BP Location: Left Wrist, Patient Position: Sitting, Cuff Size: Normal)   Pulse 76   Temp 98.1 F (36.7 C) (Temporal)   Wt 202 lb 6.4 oz (91.8 kg)   LMP 11/29/2011   SpO2 96%   BMI 35.85 kg/m  Body mass index is 35.85 kg/m. Physical Exam: GEN: alert, well developed HEENT: clear conjunctiva, nose with mild inferior turbinate hypertrophy, pink nasal mucosa, clear rhinorrhea, + cobblestoning HEART: regular rate and rhythm, no murmur LUNGS: clear to auscultation bilaterally, no coughing, unlabored respiration SKIN: no rashes or lesions  Spirometry:  Tracings reviewed. Her effort: Good reproducible efforts. FVC: 2.36L, 81% predicted  FEV1:  1.59L, 69% predicted FEV1/FVC ratio: 67% Interpretation: Spirometry consistent with mild obstructive disease.  Please see scanned spirometry results for details.  Assessment:   1. Seasonal and perennial allergic rhinitis   2. Gastroesophageal reflux disease, unspecified whether esophagitis present   3. Moderate persistent asthma without complication   4. Abnormal voice     Plan/Recommendations:   Moderate Persistent Asthma - Well controlled, spirometry with mild obstruction.   - Maintenance inhaler: Continue Breztri  160-9-4.24mcg 2 puffs twice daily with spacer. Continue Singulair  10mg  daily  - Rescue inhaler: Albuterol  2 puffs via spacer or 1 vial via nebulizer every 4-6 hours as needed for respiratory symptoms of cough, shortness of breath, or wheezing Asthma control goals:  Full participation in all desired activities (may need albuterol  before activity) Albuterol  use two times or less a week on average (not counting use with activity) Cough interfering with sleep two times or less a month Oral steroids no more than once a year No hospitalizations   Allergic Rhinitis: - Controlled, will refer to ENT for raspy voice.  - Positive skin test 11/2022: trees, ragweed, mold, cats, dogs, dust mite, mouse - Use nasal saline rinses before nose sprays such as with Neilmed Sinus Rinse.  Use distilled water .   - Use Flonase  2 sprays each nostril daily. Aim upward and outward. - Use Azelastine  2 sprays each nostril twice daily as needed for congestion, runny nose, drainage, sneezing.  Aim upward and outward.   - Use Zyrtec  10 mg daily.  - Use Singulair  10mg  daily. Stop  if there are any mood/behavioral changes. - Consider allergy  shots as long term control of your symptoms by teaching your immune system to be more tolerant of your allergy  triggers.  GERD - Controlled  - Continue Nexium  20mg  daily for 2 weeks.  Take on empty stomach in the morning.   -Avoid lying down for at least two hours  after a meal or after drinking acidic beverages, like soda, or other caffeinated beverages. This can help to prevent stomach contents from flowing back into the esophagus. -Keep your head elevated while you sleep. Using an extra pillow or two can also help to prevent reflux. -Eat smaller and more frequent meals each day instead of a few large meals. This promotes digestion and can aid in preventing heartburn. -Wear loose-fitting clothes to ease pressure on the stomach, which can worsen heartburn and reflux. -Reduce excess weight around the midsection. This can ease pressure on the stomach. Such pressure can force some stomach contents back up the esophagus.   Alpha Gal Allergy  - please strictly avoid mammalian meat.  - initial rxn: recurrent hives around 2020 that resolved with mammalian meat avoidance  - sIgE 06/2023: alpha gal 0.63 - for SKIN only reaction, okay to take Benadryl 25mg  capsules every 6 hours as needed.  - for SKIN + ANY additional symptoms, OR IF concern for LIFE THREATENING reaction = Epipen  Autoinjector EpiPen  0.3 mg. - If using Epinephrine  autoinjector, call 911  or go to the ER.       Return in about 4 months (around 03/21/2024).  Kristen Petri, MD [facility] 

## 2023-11-20 NOTE — Patient Instructions (Signed)
 Moderate Persistent Asthma - Maintenance inhaler: Continue Breztri  160-9-4.55mcg 2 puffs twice daily with spacer. Continue Singulair  10mg  daily  - Rescue inhaler: Albuterol  2 puffs via spacer or 1 vial via nebulizer every 4-6 hours as needed for respiratory symptoms of cough, shortness of breath, or wheezing Asthma control goals:  Full participation in all desired activities (may need albuterol  before activity) Albuterol  use two times or less a week on average (not counting use with activity) Cough interfering with sleep two times or less a month Oral steroids no more than once a year No hospitalizations   Allergic Rhinitis: - Positive skin test 11/2022: trees, ragweed, mold, cats, dogs, dust mite, mouse - Use nasal saline rinses before nose sprays such as with Neilmed Sinus Rinse.  Use distilled water .   - Use Flonase  2 sprays each nostril daily. Aim upward and outward. - Use Azelastine  2 sprays each nostril twice daily as needed for congestion, runny nose, drainage, sneezing.  Aim upward and outward.   - Use Zyrtec  10 mg daily.  - Use Singulair  10mg  daily. Stop if there are any mood/behavioral changes. - Consider allergy  shots as long term control of your symptoms by teaching your immune system to be more tolerant of your allergy  triggers.  GERD - Continue Nexium  20mg  daily for 2 weeks.  Take on empty stomach in the morning.   -Avoid lying down for at least two hours after a meal or after drinking acidic beverages, like soda, or other caffeinated beverages. This can help to prevent stomach contents from flowing back into the esophagus. -Keep your head elevated while you sleep. Using an extra pillow or two can also help to prevent reflux. -Eat smaller and more frequent meals each day instead of a few large meals. This promotes digestion and can aid in preventing heartburn. -Wear loose-fitting clothes to ease pressure on the stomach, which can worsen heartburn and reflux. -Reduce excess  weight around the midsection. This can ease pressure on the stomach. Such pressure can force some stomach contents back up the esophagus.   Alpha Gal Allergy  - please strictly avoid mammalian meat.  - for SKIN only reaction, okay to take Benadryl 25mg  capsules every 6 hours as needed.  - for SKIN + ANY additional symptoms, OR IF concern for LIFE THREATENING reaction = Epipen  Autoinjector EpiPen  0.3 mg. - If using Epinephrine  autoinjector, call 911  or go to the ER.

## 2023-11-21 NOTE — Telephone Encounter (Signed)
 Duplicate. Documented in result notes. LS

## 2023-11-22 ENCOUNTER — Telehealth: Payer: Self-pay | Admitting: Internal Medicine

## 2023-11-22 NOTE — Telephone Encounter (Signed)
 Sarah Garrett has been internally referred to [facility].  They will reach out to the patient to schedule.  I will follow up in one week to see if there has been any activity on the referral.

## 2023-11-29 ENCOUNTER — Encounter: Payer: Self-pay | Admitting: *Deleted

## 2023-11-29 NOTE — Telephone Encounter (Signed)
 Lavell has been scheduled with [facility]NT with Dr. Soldatova for 01/31/24 at 10:00 am.

## 2024-01-01 ENCOUNTER — Encounter: Payer: Self-pay | Admitting: Family

## 2024-01-07 ENCOUNTER — Telehealth (INDEPENDENT_AMBULATORY_CARE_PROVIDER_SITE_OTHER): Admitting: Family

## 2024-01-07 ENCOUNTER — Telehealth: Payer: Self-pay | Admitting: Family Medicine

## 2024-01-07 ENCOUNTER — Encounter: Payer: Self-pay | Admitting: Family

## 2024-01-07 ENCOUNTER — Ambulatory Visit: Payer: Self-pay | Admitting: *Deleted

## 2024-01-07 DIAGNOSIS — B0230 Zoster ocular disease, unspecified: Secondary | ICD-10-CM

## 2024-01-07 MED ORDER — VALACYCLOVIR HCL 1 G PO TABS: 1000.0000 mg | ORAL_TABLET | Freq: Three times a day (TID) | ORAL | 0 refills | Status: AC

## 2024-01-07 MED ORDER — VALACYCLOVIR HCL 1 G PO TABS: 1000.0000 mg | ORAL_TABLET | Freq: Three times a day (TID) | ORAL | 0 refills | Status: DC

## 2024-01-07 NOTE — Telephone Encounter (Signed)
 Copied from CRM [id number]. Topic: Referral - Request for Referral >> Jan 07, 2024  4:37 PM Felizardo Hotter wrote: Did the patient discuss referral with their provider in the last year? Yes (If No - schedule appointment) (If Yes - send message)  Appointment offered? Yes  Type of order/referral and detailed reason for visit: Optometrist,   Preference of office, provider, location: [facility] [address] [place], [place], [place] [address] [phone]  If referral order, have you been seen by this specialty before? No (If Yes, this issue or another issue? When? Where?  Can we respond through MyChart? No

## 2024-01-07 NOTE — Patient Instructions (Signed)
 Shingles  Shingles, or herpes zoster, is an infection. It gives you a skin rash and blisters. These infected areas may hurt a lot. Shingles only happens if: You've had chickenpox. You've been given a shot called a vaccine to protect you from getting chickenpox. Shingles is rare in this case. What are the causes? Shingles is caused by a germ called the varicella-zoster virus. This is the same germ that causes chickenpox. After you're exposed to the germ, it stays in your body but is dormant. This means it isn't active. Shingles happens if the germ becomes active again. This can happen years after you're first exposed to the germ. What increases the risk? You may be more likely to get shingles if: You're older than 65 years of age. You're under a lot of stress. You have a weak immune system. The immune system is your body's defense system. It may be weak if: You have human immunodeficiency virus (HIV). You have acquired immunodeficiency syndrome (AIDS). You have cancer. You take medicines that weaken your immune system. These include organ transplant medicines. What are the signs or symptoms? The first symptoms of shingles may be itching, tingling, or pain. Your skin may feel like it's burning. A few days or weeks later, you'll get a rash. Here's what you can expect: The rash is likely to be on one side of your body. The rash may be shaped like a belt or a band. Over time, it will turn into blisters filled with fluid. The blisters will break open and change into scabs. The scabs will dry up in about 2-3 weeks. You may also have: A fever. Chills. A headache. Nausea. How is this diagnosed? Shingles is diagnosed with a skin exam. A sample called a culture may be taken from one of your blisters and sent to a lab. This will show if you have shingles. How is this treated? The rash may last for several weeks. There's no cure for shingles, but your health care provider may give you medicines.  These medicines may: Help with pain. Help with itching. Help with irritation and swelling. Help you get better sooner. Help to prevent long-term problems. If the rash is on your face, you may need to see an eye doctor or an ear, nose, and throat (ENT) doctor. Follow these instructions at home: Medicines Take your medicines only as told by your provider. Put an anti-itch cream or numbing cream on the rash or blisters as told by your provider. Relieving itching and discomfort  To help with itching: Put cold, wet cloths called cold compresses on the rash or blisters. Take a cool bath. Try adding baking soda or dry oatmeal to the water. Do not bathe in hot water. Use calamine lotion on the rash or blisters. You can get this type of lotion at the store. Blister and rash care Keep your rash covered with a loose bandage. Wear loose clothes that don't rub on your rash. Take care of your rash as told by your provider. Make sure you: Wash your hands with soap and water for at least 20 seconds before and after you change your bandage. If you can't use soap and water, use hand sanitizer. Keep your rash and blisters clean by washing them with mild soap and cool water. Change your bandage. Check your rash every day for signs of infection. Check for: More redness, swelling, or pain. Fluid or blood. Warmth. Pus or a bad smell. Do not scratch your rash. Do not pick at your  blisters. To help you not scratch: Keep your fingernails clean and cut short. Try to wear gloves or mittens when you sleep. General instructions Rest. Wash your hands often with soap and water for at least 20 seconds. If you can't use soap and water, use hand sanitizer. Washing your hands lowers your chance of getting a skin infection. Your infection can cause chickenpox in others. If you have blisters that aren't scabs yet, stay away from: Babies. Pregnant people. Children who have eczema. Older people who have organ  transplants. People who have a long-term, or chronic, illness. Anyone who hasn't had chickenpox before. Anyone who hasn't gotten the chickenpox vaccine. How is this prevented? Vaccines are the best way to prevent you from getting chickenpox or shingles. Talk with your provider about getting these shots. Where to find more information Centers for Disease Control and Prevention (CDC): [employer].no Contact a health care provider if: Your pain doesn't get better with medicine. Your pain doesn't get better after the rash heals. You have any signs of infection around the rash. Your rash or blisters get worse. You have a fever or chills. Get help right away if: The rash is on your face or nose. You have pain in your face or by your eye. You lose feeling on one side of your face. You have trouble seeing. You have ear pain or ringing in your ear. This information is not intended to replace advice given to you by your health care provider. Make sure you discuss any questions you have with your health care provider. Document Revised: 04/19/2023 Document Reviewed: 09/01/2022 Elsevier Patient Education  2024 [employer].

## 2024-01-07 NOTE — Telephone Encounter (Signed)
 FYI Only or Action Required?: Action required by provider  Patient was last seen in primary care on 11/15/2023 by Roise Cleaver, MD. Called Nurse Triage reporting Rash. Symptoms began several days ago. Interventions attempted: Nothing. Symptoms are: gradually worsening.  Triage Disposition: See HCP Within 4 Hours (Or PCP Triage)  Patient/caregiver understands and will follow disposition?: Patient is calling for late afternoon appointment- [facility] with [facility] now- patient advised no open appointment- will send request Reason for Disposition  Shingles rash on the eyelid or tip of the nose  Answer Assessment - Initial Assessment Questions 1. APPEARANCE of RASH: "Describe the rash."     Blisters/redness 2. LOCATION: "Where is the rash located?"       Bridge of the nose-across the top- glasses area  5. ONSET: "When did the rash start?"      Started Saturday  6. ITCHING: "Does the rash itch?" If Yes, ask: "How bad is the itch?"  (Scale 0-10; or none, mild, moderate, severe)     no 7. PAIN: "Does the rash hurt?" If Yes, ask: "How bad is the pain?"  (Scale 0-10; or none, mild, moderate, severe)    - NONE (0): no pain    - MILD (1-3): doesn't interfere with normal activities     - MODERATE (4-7): interferes with normal activities or awakens from sleep     - SEVERE (8-10): excruciating pain, unable to do any normal activities     Little pain/burning-mild 8. OTHER SYMPTOMS: "Do you have any other symptoms?" (e.g., fever)     headache  Answer Assessment - Initial Assessment Questions 1. APPEARANCE of RASH: "Describe the rash."      *No Answer* 2. LOCATION: "Where is the rash located?"      *No Answer* 3. ONSET: "When did the rash start?"      *No Answer* 4. ITCHING: "Does the rash itch?" If Yes, ask: "How bad is the itch?"  (Scale 1-10; or mild, moderate, severe)     *No Answer* 5. PAIN: "Does the rash hurt?" If Yes, ask: "How bad is the pain?"  (Scale 0-10; or none,  mild, moderate, severe)    - NONE (0): no pain    - MILD (1-3): doesn't interfere with normal activities     - MODERATE (4-7): interferes with normal activities or awakens from sleep     - SEVERE (8-10): excruciating pain, unable to do any normal activities     *No Answer* 6. OTHER SYMPTOMS: "Do you have any other symptoms?" (e.g., fever)     *No Answer* 7. PREGNANCY: "Is there any chance you are pregnant?" "When was your last menstrual period?"     *No Answer*  Protocols used: Rash or Redness - Localized-A-AH, Shingles (Zoster)-A-AH   Copied from CRM [id number]. Topic: Clinical - Red Word Triage >> Jan 07, 2024 12:45 PM Crispin Dolphin wrote: Red Word that prompted transfer to Nurse Triage: Rash possible shingles, headache

## 2024-01-07 NOTE — Addendum Note (Signed)
 Addended by: Lisbeth Rides on: 01/07/2024 04:57 PM   Modules accepted: Orders

## 2024-01-07 NOTE — Telephone Encounter (Signed)
 Video visit made with DOD. Pt verbalized understanding

## 2024-01-07 NOTE — Progress Notes (Signed)
 Virtual Visit Consent   Sarah Garrett, you are scheduled for a virtual visit with a [facility] provider today. Just as with appointments in the office, your consent must be obtained to participate. Your consent will be active for this visit and any virtual visit you may have with one of our providers in the next 365 days. If you have a MyChart account, a copy of this consent can be sent to you electronically.  As this is a virtual visit, video technology does not allow for your provider to perform a traditional examination. This may limit your provider's ability to fully assess your condition. If your provider identifies any concerns that need to be evaluated in person or the need to arrange testing (such as labs, EKG, etc.), we will make arrangements to do so. Although advances in technology are sophisticated, we cannot ensure that it will always work on either your end or our end. If the connection with a video visit is poor, the visit may have to be switched to a telephone visit. With either a video or telephone visit, we are not always able to ensure that we have a secure connection.  By engaging in this virtual visit, you consent to the provision of healthcare and authorize for your insurance to be billed (if applicable) for the services provided during this visit. Depending on your insurance coverage, you may receive a charge related to this service.  I need to obtain your verbal consent now. Are you willing to proceed with your visit today? Sarah Garrett has provided verbal consent on 01/07/2024 for a virtual visit (video or telephone). Tommas Fragmin, FNP  Date: 01/07/2024 4:17 PM   Virtual Visit via Video Note   I, Tommas Fragmin, connected with  Sarah Garrett  ([id number], [birth year]) on 01/07/24 at  4:45 PM EDT by a video-enabled telemedicine application and verified that I am speaking with the correct person using two identifiers.  Location: Patient: Virtual Visit Location  Patient: Home Provider: Virtual Visit Location Provider: Home Office   I discussed the limitations of evaluation and management by telemedicine and the availability of in person appointments. The patient expressed understanding and agreed to proceed.    History of Present Illness: Sarah Garrett is a 63 y.o. who identifies as a female who was assigned female at birth, and is being seen today for rash on face that she noticed two days ago.  HPI: Rash This is a new problem. The current episode started in the past 7 days. The problem has been gradually worsening since onset. The affected locations include the face. Associated symptoms include eye pain. (Headache ) The treatment provided no relief.    Problems:  Patient Active Problem List   Diagnosis Date Noted   Diverticulitis of intestine with perforation and abscess 03/22/2020   Hypokalemia 03/22/2020   Diverticulitis of large intestine with perforation without bleeding    Morbid obesity (HCC) 09/26/2019   Allergy  to alpha-gal 03/14/2019   Contact dermatitis 03/24/2013   Asthma 03/24/2013   Melanoma of upper arm, right. T1a., History of 09/07/2011   Hemochromatosis 07/07/2011   Hypothyroidism 02/17/2008   Hyperlipidemia 10/11/2007   ANEMIA-NOS 10/11/2007   Essential hypertension 10/11/2007   ALLERGIC RHINITIS 10/11/2007   BRONCHITIS, CHRONIC 10/11/2007   GERD 10/11/2007   ADENOIDECTOMY, HX OF 10/11/2007    Allergies:  Allergies  Allergen Reactions   Aspirin Swelling    Swelling of mouth and throat   Ace Inhibitors Other (See Comments)  Per YN:[id number]   Actos [Pioglitazone Hydrochloride] Other (See Comments)    Per pt: unknown   Alpha-Gal Other (See Comments)    unknown   Ciprofloxacin  Other (See Comments)    Unknown reaction   Crestor  [Rosuvastatin  Calcium ] Other (See Comments)    Per pt: unknown   Pneumococcal Vaccines Rash   Sulfonamide Derivatives Other (See Comments)    Swelling of mouth and throat    Medications:  Current Outpatient Medications:    valACYclovir  (VALTREX ) 1000 MG tablet, Take 1 tablet (1,000 mg total) by mouth 3 (three) times daily., Disp: 30 tablet, Rfl: 0   albuterol  (PROVENTIL ) (2.5 MG/3ML) 0.083% nebulizer solution, Take 3 mLs (2.5 mg total) by nebulization every 6 (six) hours as needed., Disp: 75 mL, Rfl: 1   albuterol  (VENTOLIN  HFA) 108 (90 Base) MCG/ACT inhaler, Inhale 1-2 puffs into the lungs every 6 (six) hours as needed for wheezing or shortness of breath., Disp: 8 g, Rfl: 1   azelastine  (ASTELIN ) 0.1 % nasal spray, Place 1 spray into both nostrils 2 (two) times daily as needed for allergies or rhinitis. Use in each nostril as directed, Disp: 30 mL, Rfl: 5   budeson-glycopyrrolate-formoterol  (BREZTRI  AEROSPHERE) 160-9-4.8 MCG/ACT AERO inhaler, Inhale 2 puffs into the lungs in the morning and at bedtime., Disp: 10.7 g, Rfl: 5   cetirizine  (ZYRTEC  ALLERGY ) 10 MG tablet, Take 1 tablet (10 mg total) by mouth daily., Disp: 30 tablet, Rfl: 5   EPINEPHrine  0.3 mg/0.3 mL IJ SOAJ injection, Inject 0.3 mg into the muscle as needed for anaphylaxis., Disp: 2 each, Rfl: 1   esomeprazole  (NEXIUM ) 20 MG capsule, Take 1 capsule (20 mg total) by mouth daily at 12 noon., Disp: 30 capsule, Rfl: 3   fluticasone  (FLONASE ) 50 MCG/ACT nasal spray, Place 2 sprays into both nostrils daily., Disp: 16 g, Rfl: 5   furosemide  (LASIX ) 40 MG tablet, Take 1 tablet (40 mg total) by mouth daily as needed., Disp: 90 tablet, Rfl: 3   hyoscyamine  (LEVSIN SL) 0.125 MG SL tablet, Place 1 tablet (0.125 mg total) under the tongue as needed. Place 1-2 tablet under the tongue until it dissolves as needed, Disp: 30 tablet, Rfl: 5   icosapent  Ethyl (VASCEPA ) 1 g capsule, TAKE 1 CAPSULE BY MOUTH 2 TIMES DAILY. (Patient not taking: Reported on 11/20/2023), Disp: 180 capsule, Rfl: 3   levothyroxine  (SYNTHROID ) 125 MCG tablet, Take 1 tablet (125 mcg total) by mouth daily., Disp: 90 tablet, Rfl: 3    losartan -hydrochlorothiazide (HYZAAR ) 100-12.5 MG tablet, Take 1 tablet by mouth daily. as directed, Disp: 90 tablet, Rfl: 3   montelukast  (SINGULAIR ) 10 MG tablet, TAKE 1 TABLET BY MOUTH EVERYDAY AT BEDTIME, Disp: 90 tablet, Rfl: 1   Multiple Vitamins-Minerals (MULTIVITAMIN WITH MINERALS) tablet, Take 1 tablet by mouth daily. One a day woman, Disp: , Rfl:    potassium chloride  (KLOR-CON ) 10 MEQ tablet, Take 1 tablet (10 mEq total) by mouth daily., Disp: 90 tablet, Rfl: 1   rosuvastatin  (CRESTOR ) 20 MG tablet, TAKE 1 TABLET BY MOUTH EVERY DAY, Disp: 90 tablet, Rfl: 3   Spacer/Aero-Holding Chambers DEVI, 1 Device by Does not apply route in the morning and at bedtime., Disp: 1 each, Rfl: 1   Vitamin D , Ergocalciferol , (DRISDOL ) 1.25 MG (50000 UNIT) CAPS capsule, TAKE 1 CAPSULE (50,000 UNITS TOTAL) BY MOUTH EVERY 7 (SEVEN) DAYS, Disp: 13 capsule, Rfl: 3  Current Facility-Administered Medications:    betamethasone  acetate-betamethasone  sodium phosphate (CELESTONE ) injection 6 mg, 6 mg, Intramuscular, Once, Stacks, Vallorie Gayer,  MD  Observations/Objective: Patient is well-developed, well-nourished in no acute distress.  Resting comfortably  at home.  Head is normocephalic, atraumatic.  No labored breathing. Speech is clear and coherent with logical content.  Patient is alert and oriented at baseline.  Cluster vesicle rash on forehead extending down to right eye. Right eye lid mildly erythemas in corner.   Assessment and Plan: 1. Herpes zoster with ophthalmic complication, unspecified herpes zoster eye disease (Primary) - valACYclovir  (VALTREX ) 1000 MG tablet; Take 1 tablet (1,000 mg total) by mouth 3 (three) times daily.  Dispense: 30 tablet; Refill: 0  Pt will call her ophthalmologist today for an appointment  Start Valtrex  today Avoid elderly, pregnant, or immune compromised patients Cool compresses  Will get shingles vaccine in 6 months  Follow up if symptoms worsen or do not improve   Follow  Up Instructions: I discussed the assessment and treatment plan with the patient. The patient was provided an opportunity to ask questions and all were answered. The patient agreed with the plan and demonstrated an understanding of the instructions.  A copy of instructions were sent to the patient via MyChart unless otherwise noted below.    The patient was advised to call back or seek an in-person evaluation if the symptoms worsen or if the condition fails to improve as anticipated.    Tommas Fragmin, FNP

## 2024-01-08 ENCOUNTER — Other Ambulatory Visit: Payer: Self-pay | Admitting: Family Medicine

## 2024-01-08 NOTE — Telephone Encounter (Signed)
Pt. Needs to be seen for this. Thanks, WS 

## 2024-01-09 NOTE — Telephone Encounter (Signed)
 Lmtcb. Pt needs a office or video visit if she calls back please schedule 01/09/24 LS

## 2024-01-10 NOTE — Telephone Encounter (Signed)
 Pt reports that she did have a visit with christy regarding this. Groat is scheduling her anyway's without referral. LS

## 2024-01-31 ENCOUNTER — Encounter (INDEPENDENT_AMBULATORY_CARE_PROVIDER_SITE_OTHER): Payer: Self-pay | Admitting: Otolaryngology

## 2024-01-31 ENCOUNTER — Other Ambulatory Visit (HOSPITAL_COMMUNITY): Payer: Self-pay | Admitting: *Deleted

## 2024-01-31 ENCOUNTER — Encounter: Payer: Self-pay | Admitting: Family

## 2024-01-31 ENCOUNTER — Ambulatory Visit (INDEPENDENT_AMBULATORY_CARE_PROVIDER_SITE_OTHER): Admitting: Otolaryngology

## 2024-01-31 VITALS — BP 129/76 | HR 79

## 2024-01-31 DIAGNOSIS — Z87891 Personal history of nicotine dependence: Secondary | ICD-10-CM | POA: Diagnosis not present

## 2024-01-31 DIAGNOSIS — R490 Dysphonia: Secondary | ICD-10-CM | POA: Diagnosis not present

## 2024-01-31 DIAGNOSIS — R0989 Other specified symptoms and signs involving the circulatory and respiratory systems: Secondary | ICD-10-CM

## 2024-01-31 DIAGNOSIS — J383 Other diseases of vocal cords: Secondary | ICD-10-CM

## 2024-01-31 DIAGNOSIS — J3089 Other allergic rhinitis: Secondary | ICD-10-CM | POA: Diagnosis not present

## 2024-01-31 DIAGNOSIS — R0981 Nasal congestion: Secondary | ICD-10-CM

## 2024-01-31 DIAGNOSIS — J343 Hypertrophy of nasal turbinates: Secondary | ICD-10-CM

## 2024-01-31 DIAGNOSIS — K219 Gastro-esophageal reflux disease without esophagitis: Secondary | ICD-10-CM

## 2024-01-31 DIAGNOSIS — R1310 Dysphagia, unspecified: Secondary | ICD-10-CM

## 2024-01-31 DIAGNOSIS — R059 Cough, unspecified: Secondary | ICD-10-CM

## 2024-01-31 DIAGNOSIS — J342 Deviated nasal septum: Secondary | ICD-10-CM

## 2024-01-31 DIAGNOSIS — R0982 Postnasal drip: Secondary | ICD-10-CM

## 2024-01-31 NOTE — Progress Notes (Signed)
 ENT CONSULT:  Reason for Consult: hoarseness dysphonia x for years    HPI: Discussed the use of AI scribe software for clinical note transcription with the patient, who gave verbal consent to proceed.  History of Present Illness Sarah Garrett is a 63 year old female who presents with chronic hoarseness and voice changes.  She has experienced hoarseness and a raspy voice for several years, with episodes severe enough to impede her ability to speak. Her symptoms have persisted due to prioritizing care for her mother, who has breast cancer.  She uses Zyrtec  and Astelin  nasal spray for allergies and occasionally uses albuterol  for breathing issues. Recently, she started using Breztri . She has a history of heartburn and reflux, for which she takes Nexium  daily, although she sometimes forgets to take it.   She experiences frequent choking when swallowing, particularly with water . She recalls an incident at church where she choked on a sip of water  but managed to clear her throat herself. She often feels the need to clear her throat.  She has a history of smoking when she was younger but quit long time ago.      Records Reviewed:  [facility] 11/20/23 Sarah Garrett (DOB: [birth year]) is a 63 y.o. female who [facility] for follow up for alpha gal, allergic rhinitis, asthma, GERD.   History obtained from: chart review and patient. Last visit was with me on 09/21/2023  Asthma/GERD- recent flare up of cough, thought to be GERD related, discussed restarting PPI.  Continue breztri /singulair   AR- uncontrolled, added Azelastine , consider AIT  Alpha gal- avoiding mammalian meat   Reports doing better since last visit.  Not much trouble with cough/wheezing..  Only has had to use Albuterol  twice since last visit, when pollen counts were high. No ER/urgent care/prednisone  use since last visit.   Allergies are doing okay. Uses Zyrtec   and Singulair  daily, nose sprays PRN. Still with raspy voice, no improvement.    Reflux is controlled on Nexium  daily; has not been able to stop it as GERD worsens.     Past Medical History:  Diagnosis Date   Anemia    Diverticulosis    GERD (gastroesophageal reflux disease)    H/O adenoidectomy    Hemochromatosis 07/07/2011   Hyperlipidemia    Hypertension    Melanoma (HCC)    right shoulder   Thyroid  disease     Past Surgical History:  Procedure Laterality Date   BREAST BIOPSY  07/31/2002   left; benign   COLONOSCOPY WITH PROPOFOL  N/A 06/29/2020   Procedure: COLONOSCOPY WITH PROPOFOL ;  Surgeon: Golda Claudis PENNER, MD;  Location: [facility];  Service: Endoscopy;  Laterality: N/A;   KNEE ARTHROSCOPY W/ MENISCAL REPAIR Right    MELANOMA EXCISION  09/28/2011   right shoulder   TONSILLECTOMY AND ADENOIDECTOMY      Family History  Problem Relation Age of Onset   Heart disease Mother        by pass   Breast cancer Mother 30   Colon cancer Neg Hx    Stomach cancer Neg Hx     Social History:  reports that she quit smoking about 46 years ago. Her smoking use included cigarettes. She started smoking about 49 years ago. She has a 0.9 pack-year smoking history. She has been exposed to tobacco smoke. She has never used smokeless tobacco. She reports that she does not drink alcohol  and does not use drugs.  Allergies:  Allergies  Allergen Reactions   Aspirin Swelling    Swelling of mouth and throat   Ace Inhibitors Other (See Comments)    Per eu:[id number]   Actos [Pioglitazone Hydrochloride] Other (See Comments)    Per pt: unknown   Alpha-Gal Other (See Comments)    unknown   Ciprofloxacin  Other (See Comments)    Unknown reaction   Crestor  [Rosuvastatin  Calcium ] Other (See Comments)    Per pt: unknown   Pneumococcal Vaccines Rash   Sulfonamide Derivatives Other (See Comments)    Swelling of mouth and throat    Medications: I have reviewed the patient's current  medications.  The PMH, PSH, Medications, Allergies, and SH were reviewed and updated.  ROS: Constitutional: Negative for fever, weight loss and weight gain. Cardiovascular: Negative for chest pain and dyspnea on exertion. Respiratory: Is not experiencing shortness of breath at rest. Gastrointestinal: Negative for nausea and vomiting. Neurological: Negative for headaches. Psychiatric: The patient is not nervous/anxious  Blood pressure 129/76, pulse 79, last menstrual period 11/29/2011, SpO2 93%. There is no height or weight on file to calculate BMI.  PHYSICAL EXAM:  Exam: General: Well-developed, well-nourished Communication and Voice: raspy Respiratory Respiratory effort: Equal inspiration and expiration without stridor Cardiovascular Peripheral Vascular: Warm extremities with equal color/perfusion Eyes: No nystagmus with equal extraocular motion bilaterally Neuro/Psych/Balance: Patient oriented to person, place, and time; Appropriate mood and affect; Gait is intact with no imbalance; Cranial nerves I-XII are intact Head and Face Inspection: Normocephalic and atraumatic without mass or lesion Palpation: Facial skeleton intact without bony stepoffs Salivary Glands: No mass or tenderness Facial Strength: Facial motility symmetric and full bilaterally ENT Pinna: External ear intact and fully developed External canal: Canal is patent with intact skin Tympanic Membrane: Clear and mobile External Nose: No scar or anatomic deformity Internal Nose: Septum is deviated to the left. No polyp, or purulence. Mucosal edema and erythema present.  Bilateral inferior turbinate hypertrophy.  Lips, Teeth, and gums: Mucosa and teeth intact and viable TMJ: No pain to palpation with full mobility Oral cavity/oropharynx: No erythema or exudate, no lesions present Nasopharynx: No mass or lesion with intact mucosa Hypopharynx: Intact mucosa without pooling of secretions Larynx Glottic: Full true  vocal cord mobility without lesion or mass Supraglottic: Normal appearing epiglottis and AE folds Interarytenoid Space: Moderate pachydermia&edema Subglottic Space: Patent without lesion or edema Neck Neck and Trachea: Midline trachea without mass or lesion Thyroid : No mass or nodularity Lymphatics: No lymphadenopathy  Procedure: Preoperative diagnosis: dysphonia and dysphagia for years  Postoperative diagnosis:   Same + VF atrophy + GERD LPR and PND  Procedure: Flexible fiberoptic laryngoscopy  Surgeon: Alejos Reinhardt, MD  Anesthesia: Topical lidocaine and Afrin Complications: None Condition is stable throughout exam  Indications and consent:  The patient presents to the clinic with above symptoms. Indirect laryngoscopy view was incomplete. Thus it was recommended that they undergo a flexible fiberoptic laryngoscopy. All of the risks, benefits, and potential complications were reviewed with the patient preoperatively and verbal informed consent was obtained.  Procedure: The patient was seated upright in the clinic. Topical lidocaine and Afrin were applied to the nasal cavity. After adequate anesthesia had occurred, I then proceeded to pass the flexible telescope into the nasal cavity. The nasal cavity was patent without rhinorrhea or polyp. The nasopharynx was also patent without mass or lesion. The base of tongue was visualized and was normal. There were no signs of pooling of secretions in the piriform sinuses. The true vocal folds  were mobile bilaterally. There were no signs of glottic or supraglottic mucosal lesion or mass. There was moderate interarytenoid pachydermia and post cricoid edema. The telescope was then slowly withdrawn and the patient tolerated the procedure throughout.    Assessment/Plan: Encounter Diagnoses  Name Primary?   Glottic insufficiency Yes   Dysphonia    Hoarseness    Age-related vocal fold atrophy    Chronic GERD    Chronic nasal congestion     Environmental and seasonal allergies    Post-nasal drip    Nasal septal deviation    Hypertrophy of both inferior nasal turbinates    Chronic throat clearing    Dysphagia, unspecified type     Assessment and Plan Assessment & Plan Hoarseness for years  Chronic hoarseness, intermittent voice loss. Flexible scope exam without masses or lesions, but evidence of VF atrophy, glottic insufficiency, post-nasal drainage and GERD LPR. 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  - Refer to speech therapy for voice optimization. - Re-evaluate post-speech therapy for further interventions.  Chronic nasal congestion Environmental allergies and Postnasal drip Postnasal drainage contributing to hoarseness. - Recommend nasal saline rinses. - continue Zyrtec  and Astelin  nasal spray.   Gastroesophageal reflux disease (GERD) Chronic GERD with persistent reflux irritation despite Nexium . - Recommend Reflux Gourmet supplement after meals. - Continue Nexium .  Dysphagia Frequent choking episodes, particularly with water . - Order swallow study. MBS and esophagram       Thank you for allowing me to participate in the care of this patient. Please do not hesitate to contact me with any questions or concerns.   Elena Larry, MD Otolaryngology [facility] Phone: [phone] Fax: [phone]    01/31/2024, 10:16 AM

## 2024-01-31 NOTE — Patient Instructions (Signed)
 Sarah Garrett Med Nasal Saline Rinse   - start nasal saline rinses with [employer] Bottle available over the counter or online to help with nasal congestion

## 2024-02-04 ENCOUNTER — Encounter: Payer: Self-pay | Admitting: Family

## 2024-02-07 ENCOUNTER — Other Ambulatory Visit: Payer: Self-pay

## 2024-02-07 ENCOUNTER — Encounter: Payer: Self-pay | Admitting: Speech Pathology

## 2024-02-07 ENCOUNTER — Ambulatory Visit: Attending: Otolaryngology | Admitting: Speech Pathology

## 2024-02-07 DIAGNOSIS — J383 Other diseases of vocal cords: Secondary | ICD-10-CM | POA: Insufficient documentation

## 2024-02-07 DIAGNOSIS — R498 Other voice and resonance disorders: Secondary | ICD-10-CM | POA: Diagnosis present

## 2024-02-07 DIAGNOSIS — R1310 Dysphagia, unspecified: Secondary | ICD-10-CM | POA: Diagnosis present

## 2024-02-07 DIAGNOSIS — R490 Dysphonia: Secondary | ICD-10-CM | POA: Insufficient documentation

## 2024-02-07 NOTE — Patient Instructions (Addendum)
  Reflux Gourmet - 1 tsp after each meal and before bed - [employer]  This week - focus on eliminating throat clearing - have family let you know when you clear your throat  Rather than clearing your throat, swallow, take a sip  Sustain vowel - Ha! With some volume 10x  Pitch glide up and down Ha-Ha with some volume 10x  3x Knoll low pitch Knoll mid [employer] high pitch  Read 10 sentences in a high pitch like you are calling to a neighbor over the fence  Read 10 sentences in a deep voice like you are the boss  When you project your voice and air out, you get a clear voice  When you hold your voice in your throat and talk softly, your voice gets pressed in your throat

## 2024-02-07 NOTE — Therapy (Signed)
 OUTPATIENT SPEECH LANGUAGE PATHOLOGY VOICE EVALUATION   Patient Name: Sarah Garrett MRN: [id number] DOB:Jul 06, 1961, 63 y.o., female Today's Date: 02/07/2024  PCP: Zollie Lowers, MD REFERRING PROVIDER: Okey Burns, MD  END OF SESSION:  End of Session - 02/07/24 1616     Visit Number 1    Number of Visits 12    Date for SLP Re-Evaluation 04/17/24    SLP Start Time 1400    SLP Stop Time  1445    SLP Time Calculation (min) 45 min    Activity Tolerance Patient tolerated treatment well          Past Medical History:  Diagnosis Date   Anemia    Diverticulosis    GERD (gastroesophageal reflux disease)    H/O adenoidectomy    Hemochromatosis 07/07/2011   Hyperlipidemia    Hypertension    Melanoma (HCC)    right shoulder   Thyroid  disease    Past Surgical History:  Procedure Laterality Date   BREAST BIOPSY  07/31/2002   left; benign   COLONOSCOPY WITH PROPOFOL  N/A 06/29/2020   Procedure: COLONOSCOPY WITH PROPOFOL ;  Surgeon: Golda Claudis PENNER, MD;  Location: [facility];  Service: Endoscopy;  Laterality: N/A;   KNEE ARTHROSCOPY W/ MENISCAL REPAIR Right    MELANOMA EXCISION  09/28/2011   right shoulder   TONSILLECTOMY AND ADENOIDECTOMY     Patient Active Problem List   Diagnosis Date Noted   Diverticulitis of intestine with perforation and abscess 03/22/2020   Hypokalemia 03/22/2020   Diverticulitis of large intestine with perforation without bleeding    Morbid obesity (HCC) 09/26/2019   Allergy  to alpha-gal 03/14/2019   Contact dermatitis 03/24/2013   Asthma 03/24/2013   Melanoma of upper arm, right. 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   She has experienced hoarseness and a raspy voice for several years, with episodes severe enough to impede her ability to speak. Her symptoms have persisted due to prioritizing care for her mother, who has breast cancer.   She uses Zyrtec  and Astelin  nasal spray for allergies and occasionally uses albuterol  for breathing issues. Recently, she started using Breztri . She has a history of heartburn and reflux, for which she takes Nexium  daily, although she sometimes forgets to take it.    She experiences frequent choking when swallowing, particularly with water . She recalls an incident at church where she choked on a sip of water  but managed to clear her throat herself. She often feels the need to clear her throat.  PAIN:  Are you having pain? Yes: NPRS scale: 3/10 Pain location: knees Pain description: ache Aggravating factors: movement Relieving factors: seeing a doctor  FALLS: Has patient fallen in last 6 months? No, Number of falls: 0  LIVING ENVIRONMENT: Lives with: lives with their family Lives in: House/apartment  PLOF:Level  of assistance: Independent with ADLs, Independent with IADLs Employment: Retired  PATIENT GOALS: Not choking and getting my voice to where I'm not raspy and not losing my voice  OBJECTIVE:  Note: Objective measures were completed at Evaluation unless otherwise noted.  DIAGNOSTIC FINDINGS: The nasal cavity was patent without rhinorrhea or polyp. The nasopharynx was also patent without mass or lesion. The base of  tongue was visualized and was normal. There were no signs of pooling of secretions in the piriform sinuses. The true vocal folds were mobile bilaterally. There were no signs of glottic or supraglottic mucosal lesion or mass. There was moderate interarytenoid pachydermia and post cricoid edema. Flexible scope exam without masses or lesions, but evidence of VF atrophy, glottic insufficiency, post-nasal drainage and GERD LPR. 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  - Refer to speech therapy for voice optimization. - Re-evaluate post-speech therapy for further interventions.  COGNITION: Overall cognitive status: Within functional limits for tasks assessed    SOCIAL HISTORY: Occupation: retired Water  intake: optimal Caffeine/alcohol  intake: minimal Daily voice use: moderate  PERCEPTUAL VOICE ASSESSMENT: Voice quality: hoarse, rough, strained, and vocal fatigue Vocal abuse: habitual throat clearing Resonance: normal Respiratory function: thoracic breathing  OBJECTIVE VOICE ASSESSMENT: Maximum phonation time for sustained ah: 7.55 sec Conversational loudness average: 67 dB Conversational loudness range: 65-72 dB S/z ratio: 1.32 (Suggestive of dysfunction >1.4)  PATIENT REPORTED OUTCOME MEASURES (PROM): V-RQOL: 59 - Cindy rated a 4, a lot of difficulty being anxious about her voice, trouble doing her job due to voice. She rated a 3 or a medium amount of difficulty being heard over noise, not knowing what will come out when she starts talking. Rated a 2, small difficulty running out of air, using the phone due to voice, having to repeat herself to be understood                                                                                                                            TREATMENT DATE:   Alanna - next session 02/07/24: Evaluation completed. Initiated HEP for vocal fold atrophy and glottic insufficiency with high intensity voice exercises (PhORTE) - Loud  Ah sustained 7 seconds, averaged 85dB with occasional min A - phonation clear 10/10 reps. Glides required usual mod verbal cues and modeling, mild fry at end of low glide 5/10x, other wise clear phonation. Sustaining knoll at low, mid and high pitch with rare min A after initial instruction, clear phonation 3/3 trials. Reading 10 high pitch and low pitch sentences with volume with rare min A, clear phonation. Reading in habitual pitch with intent and volume, clear phonation rare min A 10/10. Trained in throat clear alternatives - initially with frequent mod A to Id throat clears, by end of session she ID's and suppressed 2 throat clears with supervision cues. LPR education and ENT rec for alginate review   PATIENT EDUCATION: Education details: HEP for voice, vocal hygiene, throat clear alternatives,  reflux precautions Person educated: Patient Education method: Explanation, Demonstration, Verbal cues, and Handouts Education comprehension: verbalized understanding, returned demonstration, verbal cues required, and needs further education  HOME EXERCISE PROGRAM: PhoORTE, SOVTE, resonant voice exercises, flow phonation  GOALS: Goals reviewed with patient? Yes  SHORT TERM GOALS: Target date: 03/13/24  Pt will complete HEP for voice with rare min A Baseline: Goal status: INITIAL  2.  Pt will maintain 72dB 18/20 sentences Baseline:  Goal status: INITIAL  3.  Pt will use throat clear alternatives 3/4 opportunities Baseline:  Goal status: INITIAL  4.  Pt will verbalize 3 reflux precautions Baseline:  Goal status: INITIAL  5.  Pt will maintain clear phonation over 5 minute conversation with rare min A Baseline:  Goal status: INITIAL    LONG TERM GOALS: Target date: 04/17/24  Pt will complete HEP with mod I Baseline:  Goal status: INITIAL  2.  Pt will average 70dB over 15  minute conversation Baseline:  Goal status: INITIAL  3.  Pt will clear her throat no more than 2x a  sessions Baseline:  Goal status: INITIAL  4.  Pt will maintain clear phonation over 15 minute conversation Baseline:  Goal status: INITIAL  5.  Pt will improve score on VRQOL Baseline: 23 Goal status: INITIAL   ASSESSMENT:  CLINICAL IMPRESSION: Patient is a 63 y.o. female who was seen today for mild dysphonia. She reports her voice goes away after talking. She endorses some difficulty swallowing rarely when water  tickles her throat. Consider [facility] next session. Overall volume low, averaging 65-67dB with mildly hoarse voice. Increased vocal intensity improved voice quality with min A, indicating she is a good candidate for voice therapy. Consider MBSS if indicated. I recommend skilled ST to maximize intelligibility and voice quality for safety to be understood, for QOL and to reduce family burden of requesting her to repeat message. .   OBJECTIVE IMPAIRMENTS: include voice disorder and dysphagia. These impairments are limiting patient from effectively communicating at home and in community and safety when swallowing. Factors affecting potential to achieve goals and functional outcome are n/a.SABRA Patient will benefit from skilled SLP services to address above impairments and improve overall function.  REHAB POTENTIAL: Good  PLAN:  SLP FREQUENCY: 1-2x/week  SLP DURATION: 10 weeks  PLANNED INTERVENTIONS: Aspiration precaution training, Pharyngeal strengthening exercises, Diet toleration management , Environmental controls, Trials of upgraded texture/liquids, Cueing hierachy, Internal/external aids, Functional tasks, Multimodal communication approach, SLP instruction and feedback, Compensatory strategies, Patient/family education, and [id number] Treatment of speech (30 or 45 min)  MBSS    [place], Leita Caldron, CCC-SLP 02/07/2024, 4:17 PM

## 2024-02-14 ENCOUNTER — Ambulatory Visit: Admitting: Speech Pathology

## 2024-02-14 ENCOUNTER — Encounter: Payer: Self-pay | Admitting: Family

## 2024-02-14 DIAGNOSIS — R1310 Dysphagia, unspecified: Secondary | ICD-10-CM

## 2024-02-14 DIAGNOSIS — R498 Other voice and resonance disorders: Secondary | ICD-10-CM | POA: Diagnosis not present

## 2024-02-14 NOTE — Therapy (Signed)
 OUTPATIENT SPEECH LANGUAGE PATHOLOGY VOICE EVALUATION   Patient Name: Sarah Garrett MRN: [id number] DOB:25-Jul-1961, 63 y.o., female Today's Date: 02/14/2024  PCP: Zollie Lowers, MD REFERRING PROVIDER: Okey Burns, MD  END OF SESSION:  End of Session - 02/14/24 1402     Visit Number 2    Number of Visits 12    Date for SLP Re-Evaluation 04/17/24    SLP Start Time 1402    SLP Stop Time  1445    SLP Time Calculation (min) 43 min    Activity Tolerance Patient tolerated treatment well          Past Medical History:  Diagnosis Date   Anemia    Diverticulosis    GERD (gastroesophageal reflux disease)    H/O adenoidectomy    Hemochromatosis 07/07/2011   Hyperlipidemia    Hypertension    Melanoma (HCC)    right shoulder   Thyroid  disease    Past Surgical History:  Procedure Laterality Date   BREAST BIOPSY  07/31/2002   left; benign   COLONOSCOPY WITH PROPOFOL  N/A 06/29/2020   Procedure: COLONOSCOPY WITH PROPOFOL ;  Surgeon: Golda Claudis PENNER, MD;  Location: [facility];  Service: Endoscopy;  Laterality: N/A;   KNEE ARTHROSCOPY W/ MENISCAL REPAIR Right    MELANOMA EXCISION  09/28/2011   right shoulder   TONSILLECTOMY AND ADENOIDECTOMY     Patient Active Problem List   Diagnosis Date Noted   Diverticulitis of intestine with perforation and abscess 03/22/2020   Hypokalemia 03/22/2020   Diverticulitis of large intestine with perforation without bleeding    Morbid obesity (HCC) 09/26/2019   Allergy  to alpha-gal 03/14/2019   Contact dermatitis 03/24/2013   Asthma 03/24/2013   Melanoma of upper arm, right. 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   She has experienced hoarseness and a raspy voice for several years, with episodes severe enough to impede her ability to speak. Her symptoms have persisted due to prioritizing care for her mother, who has breast cancer.   She uses Zyrtec  and Astelin  nasal spray for allergies and occasionally uses albuterol  for breathing issues. Recently, she started using Breztri . She has a history of heartburn and reflux, for which she takes Nexium  daily, although she sometimes forgets to take it.    She experiences frequent choking when swallowing, particularly with water . She recalls an incident at church where she choked on a sip of water  but managed to clear her throat herself. She often feels the need to clear her throat.  PAIN:  Are you having pain? Yes: NPRS scale: 3/10 Pain location: knees Pain description: ache Aggravating factors: movement Relieving factors: seeing a doctor  FALLS: Has patient fallen in last 6 months? No, Number of falls: 0  LIVING ENVIRONMENT: Lives with: lives with their family Lives  in: House/apartment  PLOF:Level of assistance: Independent with ADLs, Independent with IADLs Employment: Retired  PATIENT GOALS: Not choking and getting my voice to where I'm not raspy and not losing my voice  OBJECTIVE:  Note: Objective measures were completed at Evaluation unless otherwise noted.  DIAGNOSTIC FINDINGS: The nasal cavity was patent without rhinorrhea or polyp. The nasopharynx was also patent without  mass or lesion. The base of tongue was visualized and was normal. There were no signs of pooling of secretions in the piriform sinuses. The true vocal folds were mobile bilaterally. There were no signs of glottic or supraglottic mucosal lesion or mass. There was moderate interarytenoid pachydermia and post cricoid edema. Flexible scope exam without masses or lesions, but evidence of VF atrophy, glottic insufficiency, post-nasal drainage and GERD LPR. 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  - Refer to speech therapy for voice optimization. - Re-evaluate post-speech therapy for further interventions.  DYSPHAGIA EVALUATION: SUBJECTIVE DYSPHAGIA REPORTS:  Date of onset: per pt report, several years  Reported symptoms: coughing with liquids, choking with liquids, globus sensation, xerostomia, regurgitation, hoarseness, and heartburn  Current diet: regular and thin liquids  Co-morbid voice changes: Yes Pill administration: takes with liquids, reports sensation of statis at top of throat   FACTORS WHICH MAY INCREASE RISK OF ADVERSE EVENT IN PRESENCE OF ASPIRATION:  General health: well appearing  Risk factors: GERD or other GI disease    ORAL MOTOR EXAMINATION: Overall status: WFL  CLINICAL SWALLOW ASSESSMENT:   Dentition: adequate natural dentition Vocal quality at baseline: hoarse and harsh Patient directly observed with POs: Yes: thin liquids  Feeding: able to feed self Liquids provided by: cup [facility] Swallow Protocol: Pass Oral phase signs and symptoms: oral holding Pharyngeal phase signs and symptoms: multiple swallows and complaints of residue  PATIENT REPORTED OUTCOME MEASURES (PROM): Question Patient's Response  My swallowing problem has caused me to lose weight 0  2.  My swallowing problem interferes with my ability to go out to meals 0  3.  Swallowing liquids takes extra effort 3  4.  Swallowing solids takes extra effort 1  5.  Swallowing pills takes  extra effort 1  6.  Swallowing is painful 0  7.  The pleasure of eating is affected by my swallowing 2  8.  When I swallow food sticks in my throat 1  9.  I cough when I eat 0  10.  Swallowing is stressful  2  TOTAL 10  0= No problem 4= Severe problem                                                                                                                         TREATMENT DATE:   02/14/24: Swallow evaluation completed, see above. Pt with teach back of throat clear alternatives, tells SLP she has increased awareness and has been attempting to implement. SLP led pt through vocal warm up using semi-occluded vocal tract exercises using straw in water   for feedback of airflow. Pt completed sustained phonation, sirens, happy birthday with model and min-A to optimize breath support. Clear, strong voicing with good breath flow evidenced. Target voice quality using high resistance phonation exercises. Pt completes sustained phonation, glides, and sentence level speech tasks with usual demonstration of clear vocal quality, occasional fry at end of exercises. SLP cues for belly breaths, avoiding speaking on residual capacity and increased effort to optimize exercise completion.   02/07/24: Evaluation completed. Initiated HEP for vocal fold atrophy and glottic insufficiency with high intensity voice exercises (PhORTE) - Loud Ah sustained 7 seconds, averaged 85dB with occasional min A - phonation clear 10/10 reps. Glides required usual mod verbal cues and modeling, mild fry at end of low glide 5/10x, other wise clear phonation. Sustaining knoll at low, mid and high pitch with rare min A after initial instruction, clear phonation 3/3 trials. Reading 10 high pitch and low pitch sentences with volume with rare min A, clear phonation. Reading in habitual pitch with intent and volume, clear phonation rare min A 10/10. Trained in throat clear alternatives - initially with frequent mod A to Id throat clears, by end  of session she ID's and suppressed 2 throat clears with supervision cues. LPR education and ENT rec for alginate review   PATIENT EDUCATION: Education details: HEP for voice, vocal hygiene, throat clear alternatives, reflux precautions Person educated: Patient Education method: Explanation, Demonstration, Verbal cues, and Handouts Education comprehension: verbalized understanding, returned demonstration, verbal cues required, and needs further education  HOME EXERCISE PROGRAM: PhoORTE, SOVTE, resonant voice exercises, flow phonation  GOALS: Goals reviewed with patient? Yes  SHORT TERM GOALS: Target date: 03/13/24  Pt will complete HEP for voice with rare min A Baseline: Goal status: INITIAL  2.  Pt will maintain 72dB 18/20 sentences Baseline:  Goal status: INITIAL  3.  Pt will use throat clear alternatives 3/4 opportunities Baseline:  Goal status: INITIAL  4.  Pt will verbalize 3 reflux precautions Baseline:  Goal status: INITIAL  5.  Pt will maintain clear phonation over 5 minute conversation with rare min A Baseline:  Goal status: INITIAL    LONG TERM GOALS: Target date: 04/17/24  Pt will complete HEP with mod I Baseline:  Goal status: INITIAL  2.  Pt will average 70dB over 15  minute conversation Baseline:  Goal status: INITIAL  3.  Pt will clear her throat no more than 2x a sessions Baseline:  Goal status: INITIAL  4.  Pt will maintain clear phonation over 15 minute conversation Baseline:  Goal status: INITIAL  5.  Pt will improve score on VRQOL Baseline: 23 Goal status: INITIAL   ASSESSMENT:  CLINICAL IMPRESSION: Patient is a 63 y.o. female who was seen today for mild dysphonia. She reports her voice goes away after talking. She endorses some difficulty swallowing rarely when water  tickles her throat. Consider [facility] next session. Overall volume low, averaging 65-67dB with mildly hoarse voice. Increased vocal intensity improved voice quality with  min A, indicating she is a good candidate for voice therapy. Consider MBSS if indicated. I recommend skilled ST to maximize intelligibility and voice quality for safety to be understood, for QOL and to reduce family burden of requesting her to repeat message. .   OBJECTIVE IMPAIRMENTS: include voice disorder and dysphagia. These impairments are limiting patient from effectively communicating at home and in community and safety when swallowing. Factors affecting potential to achieve goals and functional outcome are n/a.SABRA Patient will benefit from skilled SLP services  to address above impairments and improve overall function.  REHAB POTENTIAL: Good  PLAN:  SLP FREQUENCY: 1-2x/week  SLP DURATION: 10 weeks  PLANNED INTERVENTIONS: Aspiration precaution training, Pharyngeal strengthening exercises, Diet toleration management , Environmental controls, Trials of upgraded texture/liquids, Cueing hierachy, Internal/external aids, Functional tasks, Multimodal communication approach, SLP instruction and feedback, Compensatory strategies, Patient/family education, and 07492 Treatment of speech (30 or 45 min)  MBSS    Harlene LITTIE Ned, CCC-SLP 02/14/2024, 2:03 PM

## 2024-02-19 ENCOUNTER — Inpatient Hospital Stay (HOSPITAL_COMMUNITY)
Admission: RE | Admit: 2024-02-19 | Discharge: 2024-02-19 | Discharge status: Home / Self Care | Source: Ambulatory Visit | Attending: Family Medicine | Admitting: Family Medicine

## 2024-02-19 ENCOUNTER — Other Ambulatory Visit (INDEPENDENT_AMBULATORY_CARE_PROVIDER_SITE_OTHER): Payer: Self-pay | Admitting: Otolaryngology

## 2024-02-19 ENCOUNTER — Inpatient Hospital Stay (HOSPITAL_COMMUNITY)
Admission: RE | Admit: 2024-02-19 | Discharge: 2024-02-19 | Discharge status: Home / Self Care | Source: Ambulatory Visit | Attending: Otolaryngology

## 2024-02-19 DIAGNOSIS — R490 Dysphonia: Secondary | ICD-10-CM

## 2024-02-19 DIAGNOSIS — K449 Diaphragmatic hernia without obstruction or gangrene: Secondary | ICD-10-CM | POA: Insufficient documentation

## 2024-02-19 DIAGNOSIS — R059 Cough, unspecified: Secondary | ICD-10-CM

## 2024-02-19 DIAGNOSIS — J3089 Other allergic rhinitis: Secondary | ICD-10-CM

## 2024-02-19 DIAGNOSIS — R1310 Dysphagia, unspecified: Secondary | ICD-10-CM

## 2024-02-19 DIAGNOSIS — R0989 Other specified symptoms and signs involving the circulatory and respiratory systems: Secondary | ICD-10-CM | POA: Insufficient documentation

## 2024-02-19 DIAGNOSIS — K219 Gastro-esophageal reflux disease without esophagitis: Secondary | ICD-10-CM

## 2024-02-19 DIAGNOSIS — J343 Hypertrophy of nasal turbinates: Secondary | ICD-10-CM

## 2024-02-19 DIAGNOSIS — R12 Heartburn: Secondary | ICD-10-CM | POA: Diagnosis not present

## 2024-02-19 DIAGNOSIS — R0982 Postnasal drip: Secondary | ICD-10-CM

## 2024-02-19 DIAGNOSIS — R0981 Nasal congestion: Secondary | ICD-10-CM

## 2024-02-19 DIAGNOSIS — J342 Deviated nasal septum: Secondary | ICD-10-CM

## 2024-02-19 DIAGNOSIS — J383 Other diseases of vocal cords: Secondary | ICD-10-CM

## 2024-02-19 NOTE — Therapy (Unsigned)
 OUTPATIENT SPEECH LANGUAGE PATHOLOGY VOICE TREATMENT   Patient Name: Sarah Garrett MRN: [id number] DOB:[birth year], 63 y.o., female Today's Date: 02/20/2024  PCP: Zollie Lowers, MD REFERRING PROVIDER: Okey Burns, MD  END OF SESSION:  End of Session - 02/20/24 0748     Visit Number 3    Number of Visits 12    Date for SLP Re-Evaluation 04/17/24    SLP Start Time 0803    SLP Stop Time  0845    SLP Time Calculation (min) 42 min    Activity Tolerance Patient tolerated treatment well           Past Medical History:  Diagnosis Date   Anemia    Diverticulosis    GERD (gastroesophageal reflux disease)    H/O adenoidectomy    Hemochromatosis 07/07/2011   Hyperlipidemia    Hypertension    Melanoma (HCC)    right shoulder   Thyroid  disease    Past Surgical History:  Procedure Laterality Date   BREAST BIOPSY  07/31/2002   left; benign   COLONOSCOPY WITH PROPOFOL  N/A 06/29/2020   Procedure: COLONOSCOPY WITH PROPOFOL ;  Surgeon: Golda Claudis PENNER, MD;  Location: [facility];  Service: Endoscopy;  Laterality: N/A;   KNEE ARTHROSCOPY W/ MENISCAL REPAIR Right    MELANOMA EXCISION  09/28/2011   right shoulder   TONSILLECTOMY AND ADENOIDECTOMY     Patient Active Problem List   Diagnosis Date Noted   Diverticulitis of intestine with perforation and abscess 03/22/2020   Hypokalemia 03/22/2020   Diverticulitis of large intestine with perforation without bleeding    Morbid obesity (HCC) 09/26/2019   Allergy  to alpha-gal 03/14/2019   Contact dermatitis 03/24/2013   Asthma 03/24/2013   Melanoma of upper arm, right. 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   She has experienced hoarseness and a raspy voice for several years, with episodes severe enough to impede her ability to speak. Her symptoms have persisted due to prioritizing care for her mother, who has breast cancer.   She uses Zyrtec  and Astelin  nasal spray for allergies and occasionally uses albuterol  for breathing issues. Recently, she started using Breztri . She has a history of heartburn and reflux, for which she takes Nexium  daily, although she sometimes forgets to take it.    She experiences frequent choking when swallowing, particularly with water . She recalls an incident at church where she choked on a sip of water  but managed to clear her throat herself. She often feels the need to clear her throat.  PAIN:  Are you having pain? Yes: NPRS scale: 3/10 Pain location: knees Pain description: ache Aggravating factors: movement Relieving factors: seeing a doctor  FALLS: Has patient fallen in last 6 months? No, Number of falls: 0  LIVING ENVIRONMENT: Lives  with: lives with their family Lives in: House/apartment  PLOF:Level of assistance: Independent with ADLs, Independent with IADLs Employment: Retired  PATIENT GOALS: Not choking and getting my voice to where I'm not raspy and not losing my voice  OBJECTIVE:  Note: Objective measures were completed at Evaluation unless otherwise noted.  DIAGNOSTIC FINDINGS: The nasal cavity was patent without rhinorrhea or polyp. The nasopharynx  was also patent without mass or lesion. The base of tongue was visualized and was normal. There were no signs of pooling of secretions in the piriform sinuses. The true vocal folds were mobile bilaterally. There were no signs of glottic or supraglottic mucosal lesion or mass. There was moderate interarytenoid pachydermia and post cricoid edema. Flexible scope exam without masses or lesions, but evidence of VF atrophy, glottic insufficiency, post-nasal drainage and GERD LPR. 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  - Refer to speech therapy for voice optimization. - Re-evaluate post-speech therapy for further interventions.  MBSS (02/19/24):  Sarah Garrett presented with intermittent penetration of thin liquids to the level of the vocal folds. There was no aspiration.  Pharyngeal swallow triggered at the level of the pyriform sinuses for most thin liquid boluses - thin liquids tended to spill into the larynx, reaching the vocal folds and prompting throat-clearing or cough.  Pt tended to hold neck in mild extension during testing - when cued to bring chin into neutral position, there were fewer incidents of penetration.  Oral phase was normal. There was sufficient pharyngeal stripping and adequate UES patency (no residue post-swallow). A 13 mm barium pill traveled readily through esophagus.    Recommend that she continue current diet (regular solids/thin liquids).  She will benefit from ongoing SLP tx to address awareness of head posture, preparation for thin bolus arrival, glottal closure prior to bolus arrival.  Swallow Evaluation Recommendations Recommendations: PO diet PO Diet Recommendation: Regular;Thin liquids (Level 0) Liquid Administration via: Cup;Straw Medication Administration: Whole meds with liquid Supervision: Patient able to self-feed Swallowing strategies  : Hold breath before and during swallow (supraglottic swallow);Hold breath during swallow, followed by a cough  (super supraglottic swallow);Swallow hold (Mendelsohn maneuver) (These strategies could be tried during therapy sessions to determine benefit) Oral care recommendations: Oral care BID (2x/day)  DYSPHAGIA EVALUATION: SUBJECTIVE DYSPHAGIA REPORTS:  Date of onset: per pt report, several years  Reported symptoms: coughing with liquids, choking with liquids, globus sensation, xerostomia, regurgitation, hoarseness, and heartburn  Current diet: regular and thin liquids  Co-morbid voice changes: Yes Pill administration: takes with liquids, reports sensation of statis at top of throat   FACTORS WHICH MAY INCREASE RISK OF ADVERSE EVENT IN PRESENCE OF ASPIRATION:  General health: well appearing  Risk factors: GERD or other GI disease    ORAL MOTOR EXAMINATION: Overall status: WFL  CLINICAL SWALLOW ASSESSMENT:   Dentition: adequate natural dentition Vocal quality at baseline: hoarse and harsh Patient directly observed with POs: Yes: thin liquids  Feeding: able to feed self Liquids provided by: cup [facility] Swallow Protocol: Pass Oral phase signs and symptoms: oral holding Pharyngeal phase signs and symptoms: multiple swallows and complaints of residue  PATIENT REPORTED OUTCOME MEASURES (PROM): Question Patient's Response  My swallowing problem has caused me to lose weight 0  2.  My swallowing problem interferes with my ability to go out to meals 0  3.  Swallowing liquids takes extra effort 3  4.  Swallowing solids takes extra effort 1  5.  Swallowing pills takes extra effort 1  6.  Swallowing is painful 0  7.  The pleasure of eating is affected by my swallowing 2  8.  When I swallow food sticks in my throat 1  9.  I cough when I eat 0  10.  Swallowing is stressful  2  TOTAL 10  0= No problem 4= Severe problem                                                                                                                         TREATMENT DATE:  02/20/24: Reviewed yesterday's MBSS results  and recommendations. Pt demonstrated use of recommended neutral head posture with rare min A this date. Trialed oral hold with thin liquids for increased bolus control and containment, with some benefit observed. Introduced effortful swallows, supraglottic swallows, and Mendelsohn exercises today. Additional training required for [facility] next session d/t difficulty completing this date. Occasional coughs with thin liquids noted x3 this session. Reported increased awareness of throat clearing (in absence of PO), with pt able to employ alternatives for ~85% accuracy without cues. Reviewed SOVTE exercises with occasional min A for forward projection and labial seal. Trialed resonant voice exercises, with occasional min A required to optimize forward resonance. Increased vocal clarity exhibited following voice exercises. Utilized negative feedback practice at word level to maximize awareness of clear vs hoarse voice, with benefit.   02/14/24: Swallow evaluation completed, see above. Pt with teach back of throat clear alternatives, tells SLP she has increased awareness and has been attempting to implement. SLP led pt through vocal warm up using semi-occluded vocal tract exercises using straw in water  for feedback of airflow. Pt completed sustained phonation, sirens, happy birthday with model and min-A to optimize breath support. Clear, strong voicing with good breath flow evidenced. Target voice quality using high resistance phonation exercises. Pt completes sustained phonation, glides, and sentence level speech tasks with usual demonstration of clear vocal quality, occasional fry at end of exercises. SLP cues for belly breaths, avoiding speaking on residual capacity and increased effort to optimize exercise completion.   02/07/24: Evaluation completed. Initiated HEP for vocal fold atrophy and glottic insufficiency with high intensity voice exercises (PhORTE) - Loud Ah sustained 7 seconds, averaged 85dB with  occasional min A - phonation clear 10/10 reps. Glides required usual mod verbal cues and modeling, mild fry at end of low glide 5/10x, other wise clear phonation. Sustaining knoll at low, mid and high pitch with rare min A after initial instruction, clear phonation 3/3 trials. Reading 10 high pitch and low pitch sentences with volume with rare min A, clear phonation. Reading in habitual pitch with intent and volume, clear phonation rare min A 10/10. Trained in throat clear alternatives - initially with frequent mod A to Id throat clears, by end of session she ID's and suppressed 2 throat clears with supervision cues. LPR education and ENT rec for alginate review   PATIENT EDUCATION: Education details: HEP for voice, vocal hygiene, throat clear alternatives, reflux precautions Person  educated: Patient Education method: Programmer, multimedia, Demonstration, Verbal cues, and Handouts Education comprehension: verbalized understanding, returned demonstration, verbal cues required, and needs further education  HOME EXERCISE PROGRAM: PhoORTE, SOVTE, resonant voice exercises, flow phonation  GOALS: Goals reviewed with patient? Yes  SHORT TERM GOALS: Target date: 03/13/24  Pt will complete HEP for voice with rare min A Baseline: Goal status: ONGOING  2.  Pt will maintain 72dB 18/20 sentences Baseline:  Goal status: ONGOING  3.  Pt will use throat clear alternatives 3/4 opportunities Baseline:  Goal status: ONGOING  4.  Pt will verbalize 3 reflux precautions Baseline:  Goal status: ONGOING  5.  Pt will maintain clear phonation over 5 minute conversation with rare min A Baseline:  Goal status: ONGOING    LONG TERM GOALS: Target date: 04/17/24  Pt will complete HEP with mod I Baseline:  Goal status: ONGOING  2.  Pt will average 70dB over 15  minute conversation Baseline:  Goal status: ONGOING  3.  Pt will clear her throat no more than 2x a sessions Baseline:  Goal status: ONGOING  4.   Pt will maintain clear phonation over 15 minute conversation Baseline:  Goal status: ONGOING  5.  Pt will improve score on VRQOL Baseline: 23 Goal status: ONGOING   ASSESSMENT:  CLINICAL IMPRESSION: Patient is a 63 y.o. female who was seen today for mild dysphonia. She reports her voice goes away after talking. She endorses some difficulty swallowing rarely when water  tickles her throat. Overall volume low, averaging 65-67dB with mildly hoarse voice. Increased vocal intensity improved voice quality with min A, indicating she is a good candidate for voice therapy. I recommend skilled ST to maximize intelligibility and voice quality for safety to be understood, for QOL and to reduce family burden of requesting her to repeat message.   OBJECTIVE IMPAIRMENTS: include voice disorder and dysphagia. These impairments are limiting patient from effectively communicating at home and in community and safety when swallowing. Factors affecting potential to achieve goals and functional outcome are n/a.SABRA Patient will benefit from skilled SLP services to address above impairments and improve overall function.  REHAB POTENTIAL: Good  PLAN:  SLP FREQUENCY: 1-2x/week  SLP DURATION: 10 weeks  PLANNED INTERVENTIONS: Aspiration precaution training, Pharyngeal strengthening exercises, Diet toleration management , Environmental controls, Trials of upgraded texture/liquids, Cueing hierachy, Internal/external aids, Functional tasks, Multimodal communication approach, SLP instruction and feedback, Compensatory strategies, Patient/family education, and [id number] Treatment of speech (30 or 45 min)  MBSS    Comer LILLETTE Louder, CCC-SLP 02/20/2024, 9:24 AM

## 2024-02-19 NOTE — Evaluation (Signed)
 Modified Barium Swallow Study  Patient Details  Name: Sarah Garrett MRN: [id number] Date of Birth: 1960/12/16  Today's Date: 02/19/2024  Modified Barium Swallow completed.  Full report located under Chart Review in the Imaging Section.  History of Present Illness Sarah Garrett is a 63 year old female who was referred for an OP MBS.  She is participating in OP speech therapy to address voice (chronic hoarseness and voice changes) and has been followed by Dr. Elena Larry.  She has experienced hoarseness and a raspy voice for several years, with episodes severe enough to impede her ability to speak.   She has a history of heartburn and reflux, for which she takes Nexium  daily, although she sometimes forgets to take it. She experiences frequent choking when swallowing, particularly with water . She recalls an incident at church where she choked on a sip of water  but managed to clear her throat herself. She often feels the need to clear her throat. Flexible fiberoptic laryngoscopy 01/31/24: The base of tongue was visualized and was normal. There were no signs of pooling of secretions in the piriform sinuses. The true vocal folds were mobile bilaterally. There were no signs of glottic or supraglottic mucosal lesion or mass. There was moderate interarytenoid pachydermia and post cricoid edema. There was evidence of VF atrophy and glottic insufficiency, GERD LPR.   Clinical Impression Sarah Garrett presented with intermittent penetration of thin liquids to the level of the vocal folds. There was no aspiration.  Pharyngeal swallow triggered at the level of the pyriform sinuses for most thin liquid boluses - thin liquids tended to spill into the larynx, reaching the vocal folds and prompting throat-clearing or cough.  Pt tended to hold neck in mild extension during testing - when cued to bring chin into neutral position, there were fewer incidents of penetration.  Oral phase was normal. There was  sufficient pharyngeal stripping and adequate UES patency (no residue post-swallow). A 13 mm barium pill traveled readily through esophagus.   Recommend that she continue current diet (regular solids/thin liquids).  She will benefit from ongoing SLP tx to address awareness of head posture, preparation for thin bolus arrival, glottal closure prior to bolus arrival.  Pt watched video in real time for feedback purposes and we discussed basic results. I explained that imaging would be reviewed with greater scrutiny and report would be available in Mychart by this afternoon. Pt verbalized understanding.  Factors that may increase risk of adverse event in presence of aspiration Noe & Lianne 2021):  none  Swallow Evaluation Recommendations Recommendations: PO diet PO Diet Recommendation: Regular;Thin liquids (Level 0) Liquid Administration via: Cup;Straw Medication Administration: Whole meds with liquid Supervision: Patient able to self-feed Swallowing strategies  : Hold breath before and during swallow (supraglottic swallow);Hold breath during swallow, followed by a cough (super supraglottic swallow);Swallow hold (Mendelsohn maneuver) (These strategies could be tried during therapy sessions to determine benefit) Oral care recommendations: Oral care BID (2x/day)     Jazae Gandolfi L. Vona, MA CCC/SLP Clinical Specialist - Acute Care SLP Acute Rehabilitation Services Office number [phone]  Vona Palma Laurice 02/19/2024,1:40 PM

## 2024-02-20 ENCOUNTER — Ambulatory Visit

## 2024-02-20 DIAGNOSIS — R498 Other voice and resonance disorders: Secondary | ICD-10-CM | POA: Diagnosis not present

## 2024-02-20 DIAGNOSIS — R1310 Dysphagia, unspecified: Secondary | ICD-10-CM

## 2024-02-20 NOTE — Patient Instructions (Addendum)
?

## 2024-02-27 ENCOUNTER — Ambulatory Visit

## 2024-02-27 DIAGNOSIS — R498 Other voice and resonance disorders: Secondary | ICD-10-CM | POA: Diagnosis not present

## 2024-02-27 DIAGNOSIS — R1310 Dysphagia, unspecified: Secondary | ICD-10-CM

## 2024-02-27 NOTE — Therapy (Signed)
 OUTPATIENT SPEECH LANGUAGE PATHOLOGY VOICE TREATMENT   Patient Name: Sarah Garrett MRN: [id number] DOB:02/11/1961, 63 y.o., female Today's Date: 02/27/2024  PCP: Zollie Lowers, MD REFERRING PROVIDER: Okey Burns, MD  END OF SESSION:  End of Session - 02/27/24 1404     Visit Number 4    Number of Visits 12    Date for SLP Re-Evaluation 04/17/24    SLP Start Time 1404    SLP Stop Time  1445    SLP Time Calculation (min) 41 min    Activity Tolerance Patient tolerated treatment well            Past Medical History:  Diagnosis Date   Anemia    Diverticulosis    GERD (gastroesophageal reflux disease)    H/O adenoidectomy    Hemochromatosis 07/07/2011   Hyperlipidemia    Hypertension    Melanoma (HCC)    right shoulder   Thyroid  disease    Past Surgical History:  Procedure Laterality Date   BREAST BIOPSY  07/31/2002   left; benign   COLONOSCOPY WITH PROPOFOL  N/A 06/29/2020   Procedure: COLONOSCOPY WITH PROPOFOL ;  Surgeon: Golda Claudis PENNER, MD;  Location: [facility];  Service: Endoscopy;  Laterality: N/A;   KNEE ARTHROSCOPY W/ MENISCAL REPAIR Right    MELANOMA EXCISION  09/28/2011   right shoulder   TONSILLECTOMY AND ADENOIDECTOMY     Patient Active Problem List   Diagnosis Date Noted   Diverticulitis of intestine with perforation and abscess 03/22/2020   Hypokalemia 03/22/2020   Diverticulitis of large intestine with perforation without bleeding    Morbid obesity (HCC) 09/26/2019   Allergy  to alpha-gal 03/14/2019   Contact dermatitis 03/24/2013   Asthma 03/24/2013   Melanoma of upper arm, right. 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 Organized closet which irritated allergies Pt accompanied by: self  PERTINENT HISTORY: Sarah Garrett is a 63 year old female who presents with chronic hoarseness and voice changes.   She has experienced hoarseness and a raspy voice for several years, with episodes severe enough to impede her ability to speak. Her symptoms have persisted due to prioritizing care for her mother, who has breast cancer.   She uses Zyrtec  and Astelin  nasal spray for allergies and occasionally uses albuterol  for breathing issues. Recently, she started using Breztri . She has a history of heartburn and reflux, for which she takes Nexium  daily, although she sometimes forgets to take it.    She experiences frequent choking when swallowing, particularly with water . She recalls an incident at church where she choked on a sip of water  but managed to clear her throat herself. She often feels the need to clear her throat.  PAIN:  Are you having pain? Yes: NPRS scale: 3/10 Pain location: knees Pain description: ache Aggravating factors: movement Relieving factors: seeing a doctor  FALLS: Has patient fallen in last 6 months? No, Number of falls: 0  LIVING ENVIRONMENT:  Lives with: lives with their family Lives in: House/apartment  PLOF:Level of assistance: Independent with ADLs, Independent with IADLs Employment: Retired  PATIENT GOALS: Not choking and getting my voice to where I'm not raspy and not losing my voice  OBJECTIVE:  Note: Objective measures were completed at Evaluation unless otherwise noted.  DIAGNOSTIC FINDINGS: The nasal cavity was patent without rhinorrhea  or polyp. The nasopharynx was also patent without mass or lesion. The base of tongue was visualized and was normal. There were no signs of pooling of secretions in the piriform sinuses. The true vocal folds were mobile bilaterally. There were no signs of glottic or supraglottic mucosal lesion or mass. There was moderate interarytenoid pachydermia and post cricoid edema. Flexible scope exam without masses or lesions, but evidence of VF atrophy, glottic insufficiency, post-nasal drainage and GERD LPR. 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  - Refer to speech therapy for voice optimization. - Re-evaluate post-speech therapy for further interventions.  MBSS (02/19/24):  Ms. Curet presented with intermittent penetration of thin liquids to the level of the vocal folds. There was no aspiration.  Pharyngeal swallow triggered at the level of the pyriform sinuses for most thin liquid boluses - thin liquids tended to spill into the larynx, reaching the vocal folds and prompting throat-clearing or cough.  Pt tended to hold neck in mild extension during testing - when cued to bring chin into neutral position, there were fewer incidents of penetration.  Oral phase was normal. There was sufficient pharyngeal stripping and adequate UES patency (no residue post-swallow). A 13 mm barium pill traveled readily through esophagus.    Recommend that she continue current diet (regular solids/thin liquids).  She will benefit from ongoing SLP tx to address awareness of head posture, preparation for thin bolus arrival, glottal closure prior to bolus arrival.  Swallow Evaluation Recommendations Recommendations: PO diet PO Diet Recommendation: Regular;Thin liquids (Level 0) Liquid Administration via: Cup;Straw Medication Administration: Whole meds with liquid Supervision: Patient able to self-feed Swallowing strategies  : Hold breath before and during swallow (supraglottic swallow);Hold breath during  swallow, followed by a cough (super supraglottic swallow);Swallow hold (Mendelsohn maneuver) (These strategies could be tried during therapy sessions to determine benefit) Oral care recommendations: Oral care BID (2x/day)  DYSPHAGIA EVALUATION: SUBJECTIVE DYSPHAGIA REPORTS:  Date of onset: per pt report, several years  Reported symptoms: coughing with liquids, choking with liquids, globus sensation, xerostomia, regurgitation, hoarseness, and heartburn  Current diet: regular and thin liquids  Co-morbid voice changes: Yes Pill administration: takes with liquids, reports sensation of statis at top of throat   FACTORS WHICH MAY INCREASE RISK OF ADVERSE EVENT IN PRESENCE OF ASPIRATION:  General health: well appearing  Risk factors: GERD or other GI disease    ORAL MOTOR EXAMINATION: Overall status: WFL  CLINICAL SWALLOW ASSESSMENT:   Dentition: adequate natural dentition Vocal quality at baseline: hoarse and harsh Patient directly observed with POs: Yes: thin liquids  Feeding: able to feed self Liquids provided by: cup [facility] Swallow Protocol: Pass Oral phase signs and symptoms: oral holding Pharyngeal phase signs and symptoms: multiple swallows and complaints of residue  PATIENT REPORTED OUTCOME MEASURES (PROM): Question Patient's Response  My swallowing problem has caused me to lose weight 0  2.  My swallowing problem interferes with my ability to go out to meals 0  3.  Swallowing liquids takes extra effort 3  4.  Swallowing solids takes extra effort 1  5.  Swallowing pills takes extra effort  1  6.  Swallowing is painful 0  7.  The pleasure of eating is affected by my swallowing 2  8.  When I swallow food sticks in my throat 1  9.  I cough when I eat 0  10.  Swallowing is stressful  2  TOTAL 10  0= No problem 4= Severe problem                                                                                                                         TREATMENT DATE:  02/27/24:  Endorsed recent allergies impacting vocal quality and dryness in throat. Utilized SOVTE and resonant exercises as warm up, in which pt able to complete with mod I. Targeted conversation training/clear speech during oral reading and cognitive naming task. Pt able to maintain clear, projected voice at WNL volume given rare min A. Able to maintain clear vocal quality in subsequent conversation with rare min A. Endorsed improved swallow function with neutral head positioning and use of intent. No overt s/sx of aspiration exhibited with cup sips of thin liquids today. No throat clearing observed as well.   02/20/24: Reviewed yesterday's MBSS results and recommendations. Pt demonstrated use of recommended neutral head posture with rare min A this date. Trialed oral hold with thin liquids for increased bolus control and containment, with some benefit observed. Introduced effortful swallows, supraglottic swallows, and Mendelsohn exercises today. Additional training required for [facility] next session d/t difficulty completing this date. Occasional coughs with thin liquids noted x3 this session. Reported increased awareness of throat clearing (in absence of PO), with pt able to employ alternatives for ~85% accuracy without cues. Reviewed SOVTE exercises with occasional min A for forward projection and labial seal. Trialed resonant voice exercises, with occasional min A required to optimize forward resonance. Increased vocal clarity exhibited following voice exercises. Utilized negative feedback practice at word level to maximize awareness of clear vs hoarse voice, with benefit.   02/14/24: Swallow evaluation completed, see above. Pt with teach back of throat clear alternatives, tells SLP she has increased awareness and has been attempting to implement. SLP led pt through vocal warm up using semi-occluded vocal tract exercises using straw in water  for feedback of airflow. Pt completed sustained phonation, sirens, happy  birthday with model and min-A to optimize breath support. Clear, strong voicing with good breath flow evidenced. Target voice quality using high resistance phonation exercises. Pt completes sustained phonation, glides, and sentence level speech tasks with usual demonstration of clear vocal quality, occasional fry at end of exercises. SLP cues for belly breaths, avoiding speaking on residual capacity and increased effort to optimize exercise completion.   02/07/24: Evaluation completed. Initiated HEP for vocal fold atrophy and glottic insufficiency with high intensity voice exercises (PhORTE) - Loud Ah sustained 7 seconds, averaged 85dB with occasional min A - phonation clear 10/10 reps. Glides required usual mod verbal cues and modeling, mild fry at end of low glide 5/10x, other wise clear phonation. Sustaining knoll  at low, mid and high pitch with rare min A after initial instruction, clear phonation 3/3 trials. Reading 10 high pitch and low pitch sentences with volume with rare min A, clear phonation. Reading in habitual pitch with intent and volume, clear phonation rare min A 10/10. Trained in throat clear alternatives - initially with frequent mod A to Id throat clears, by end of session she ID's and suppressed 2 throat clears with supervision cues. LPR education and ENT rec for alginate review   PATIENT EDUCATION: Education details: HEP for voice, vocal hygiene, throat clear alternatives, reflux precautions Person educated: Patient Education method: Explanation, Demonstration, Verbal cues, and Handouts Education comprehension: verbalized understanding, returned demonstration, verbal cues required, and needs further education  HOME EXERCISE PROGRAM: PhoORTE, SOVTE, resonant voice exercises, flow phonation  GOALS: Goals reviewed with patient? Yes  SHORT TERM GOALS: Target date: 03/13/24  Pt will complete HEP for voice with rare min A Baseline: Goal status: MET  2.  Pt will maintain 72dB  18/20 sentences Baseline:  Goal status: MET  3.  Pt will use throat clear alternatives 3/4 opportunities Baseline:  Goal status: ONGOING  4.  Pt will verbalize 3 reflux precautions Baseline:  Goal status: ONGOING  5.  Pt will maintain clear phonation over 5 minute conversation with rare min A Baseline:  Goal status: MET    LONG TERM GOALS: Target date: 04/17/24  Pt will complete HEP with mod I Baseline:  Goal status: ONGOING  2.  Pt will average 70dB over 15  minute conversation Baseline:  Goal status: ONGOING  3.  Pt will clear her throat no more than 2x a sessions Baseline:  Goal status: ONGOING  4.  Pt will maintain clear phonation over 15 minute conversation Baseline:  Goal status: ONGOING  5.  Pt will improve score on VRQOL Baseline: 23 Goal status: ONGOING   ASSESSMENT:  CLINICAL IMPRESSION: Patient is a 63 y.o. female who was seen today for mild dysphonia. Continued education and instruction of voice exercises, vocal hygiene, and swallow precautions. Good carryover exhibited thus far.  I recommend skilled ST to maximize intelligibility and voice quality for safety to be understood, for QOL and to reduce family burden of requesting her to repeat message.   OBJECTIVE IMPAIRMENTS: include voice disorder and dysphagia. These impairments are limiting patient from effectively communicating at home and in community and safety when swallowing. Factors affecting potential to achieve goals and functional outcome are n/a.SABRA Patient will benefit from skilled SLP services to address above impairments and improve overall function.  REHAB POTENTIAL: Good  PLAN:  SLP FREQUENCY: 1-2x/week  SLP DURATION: 10 weeks  PLANNED INTERVENTIONS: Aspiration precaution training, Pharyngeal strengthening exercises, Diet toleration management , Environmental controls, Trials of upgraded texture/liquids, Cueing hierachy, Internal/external aids, Functional tasks, Multimodal communication  approach, SLP instruction and feedback, Compensatory strategies, Patient/family education, and [id number] Treatment of speech (30 or 45 min)  MBSS    [place] LILLETTE Louder, CCC-SLP 02/27/2024, 2:49 PM

## 2024-02-29 ENCOUNTER — Ambulatory Visit: Attending: Otolaryngology | Admitting: Speech Pathology

## 2024-02-29 DIAGNOSIS — R059 Cough, unspecified: Secondary | ICD-10-CM | POA: Diagnosis present

## 2024-02-29 DIAGNOSIS — R1310 Dysphagia, unspecified: Secondary | ICD-10-CM | POA: Insufficient documentation

## 2024-02-29 DIAGNOSIS — R498 Other voice and resonance disorders: Secondary | ICD-10-CM | POA: Insufficient documentation

## 2024-02-29 NOTE — Therapy (Signed)
 OUTPATIENT SPEECH LANGUAGE PATHOLOGY VOICE TREATMENT   Patient Name: Sarah Garrett MRN: [id number] DOB:Jun 01, 1961, 63 y.o., female Today's Date: 02/29/2024  PCP: Zollie Lowers, MD REFERRING PROVIDER: Okey Burns, MD  END OF SESSION:  End of Session - 02/29/24 0802     Visit Number 5    Number of Visits 12    Date for SLP Re-Evaluation 04/17/24    SLP Start Time 0802    SLP Stop Time  0845    SLP Time Calculation (min) 43 min    Activity Tolerance Patient tolerated treatment well            Past Medical History:  Diagnosis Date   Anemia    Diverticulosis    GERD (gastroesophageal reflux disease)    H/O adenoidectomy    Hemochromatosis 07/07/2011   Hyperlipidemia    Hypertension    Melanoma (HCC)    right shoulder   Thyroid  disease    Past Surgical History:  Procedure Laterality Date   BREAST BIOPSY  07/31/2002   left; benign   COLONOSCOPY WITH PROPOFOL  N/A 06/29/2020   Procedure: COLONOSCOPY WITH PROPOFOL ;  Surgeon: Golda Claudis PENNER, MD;  Location: [facility];  Service: Endoscopy;  Laterality: N/A;   KNEE ARTHROSCOPY W/ MENISCAL REPAIR Right    MELANOMA EXCISION  09/28/2011   right shoulder   TONSILLECTOMY AND ADENOIDECTOMY     Patient Active Problem List   Diagnosis Date Noted   Diverticulitis of intestine with perforation and abscess 03/22/2020   Hypokalemia 03/22/2020   Diverticulitis of large intestine with perforation without bleeding    Morbid obesity (HCC) 09/26/2019   Allergy  to alpha-gal 03/14/2019   Contact dermatitis 03/24/2013   Asthma 03/24/2013   Melanoma of upper arm, right. 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   She has experienced hoarseness and a raspy voice for several years, with episodes severe enough to impede her ability to speak. Her symptoms have persisted due to prioritizing care for her mother, who has breast cancer.   She uses Zyrtec  and Astelin  nasal spray for allergies and occasionally uses albuterol  for breathing issues. Recently, she started using Breztri . She has a history of heartburn and reflux, for which she takes Nexium  daily, although she sometimes forgets to take it.    She experiences frequent choking when swallowing, particularly with water . She recalls an incident at church where she choked on a sip of water  but managed to clear her throat herself. She often feels the need to clear her throat.  PAIN:  Are you having pain? Yes: NPRS scale: 3/10 Pain location: knees Pain description: ache Aggravating factors: movement Relieving factors: seeing a doctor  FALLS: Has patient fallen in last 6 months? No, Number of falls: 0  LIVING ENVIRONMENT: Lives with: lives with their family Lives in: House/apartment  PLOF:Level of assistance: Independent with ADLs, Independent with IADLs Employment: Retired  PATIENT GOALS: Not choking and getting my voice to where I'm not raspy and not losing my voice  OBJECTIVE:  Note: Objective measures were completed at Evaluation unless otherwise noted.  DIAGNOSTIC FINDINGS: The nasal cavity was patent without rhinorrhea or  polyp. The nasopharynx was also patent without mass or lesion. The base of tongue was visualized and was normal. There were no signs of pooling of secretions in the piriform sinuses. The true vocal folds were mobile bilaterally. There were no signs of glottic or supraglottic mucosal lesion or mass. There was moderate interarytenoid pachydermia and post cricoid edema. Flexible scope exam without masses or lesions, but evidence of VF atrophy, glottic insufficiency, post-nasal drainage and GERD LPR. 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  - Refer to speech therapy for voice optimization. - Re-evaluate post-speech therapy for further interventions.  MBSS (02/19/24):  Ms. Rebel presented with intermittent penetration of thin liquids to the level of the vocal folds. There was no aspiration.  Pharyngeal swallow triggered at the level of the pyriform sinuses for most thin liquid boluses - thin liquids tended to spill into the larynx, reaching the vocal folds and prompting throat-clearing or cough.  Pt tended to hold neck in mild extension during testing - when cued to bring chin into neutral position, there were fewer incidents of penetration.  Oral phase was normal. There was sufficient pharyngeal stripping and adequate UES patency (no residue post-swallow). A 13 mm barium pill traveled readily through esophagus.    Recommend that she continue current diet (regular solids/thin liquids).  She will benefit from ongoing SLP tx to address awareness of head posture, preparation for thin bolus arrival, glottal closure prior to bolus arrival.  Swallow Evaluation Recommendations Recommendations: PO diet PO Diet Recommendation: Regular;Thin liquids (Level 0) Liquid Administration via: Cup;Straw Medication Administration: Whole meds with liquid Supervision: Patient able to self-feed Swallowing strategies  : Hold breath before and during swallow (supraglottic swallow);Hold breath during  swallow, followed by a cough (super supraglottic swallow);Swallow hold (Mendelsohn maneuver) (These strategies could be tried during therapy sessions to determine benefit) Oral care recommendations: Oral care BID (2x/day)  DYSPHAGIA EVALUATION: SUBJECTIVE DYSPHAGIA REPORTS:  Date of onset: per pt report, several years  Reported symptoms: coughing with liquids, choking with liquids, globus sensation, xerostomia, regurgitation, hoarseness, and heartburn  Current diet: regular and thin liquids  Co-morbid voice changes: Yes Pill administration: takes with liquids, reports sensation of statis at top of throat   FACTORS WHICH MAY INCREASE RISK OF ADVERSE EVENT IN PRESENCE OF ASPIRATION:  General health: well appearing  Risk factors: GERD or other GI disease    ORAL MOTOR EXAMINATION: Overall status: WFL  CLINICAL SWALLOW ASSESSMENT:   Dentition: adequate natural dentition Vocal quality at baseline: hoarse and harsh Patient directly observed with POs: Yes: thin liquids  Feeding: able to feed self Liquids provided by: cup [facility] Swallow Protocol: Pass Oral phase signs and symptoms: oral holding Pharyngeal phase signs and symptoms: multiple swallows and complaints of residue  PATIENT REPORTED OUTCOME MEASURES (PROM): Question Patient's Response  My swallowing problem has caused me to lose weight 0  2.  My swallowing problem interferes with my ability to go out to meals 0  3.  Swallowing liquids takes extra effort 3  4.  Swallowing solids takes extra effort 1  5.  Swallowing pills takes  extra effort 1  6.  Swallowing is painful 0  7.  The pleasure of eating is affected by my swallowing 2  8.  When I swallow food sticks in my throat 1  9.  I cough when I eat 0  10.  Swallowing is stressful  2  TOTAL 10  0= No problem 4= Severe problem                                                                                                                         TREATMENT DATE:  02/29/24: Led  pt through SOVTE warm-up. Reviewed conversational therapy training techniques with goal of clear, lifted voice. Pt completed paragraph level oral reading with usual hoarseness. SLP revisits rote speech tasks with cues for breath support to obtain improved vocal quality. Repeat paragraph reading with improved clarity. Target use of strategies in conversation with occasional verbal cues needed to use clear voice. Pt endorses targeted effort at reducing throat clearing, much improved per pt report. Discussed upcoming d/c, pt is agreeable.   02/27/24: Endorsed recent allergies impacting vocal quality and dryness in throat. Utilized SOVTE and resonant exercises as warm up, in which pt able to complete with mod I. Targeted conversation training/clear speech during oral reading and cognitive naming task. Pt able to maintain clear, projected voice at WNL volume given rare min A. Able to maintain clear vocal quality in subsequent conversation with rare min A. Endorsed improved swallow function with neutral head positioning and use of intent. No overt s/sx of aspiration exhibited with cup sips of thin liquids today. No throat clearing observed as well.   02/20/24: Reviewed yesterday's MBSS results and recommendations. Pt demonstrated use of recommended neutral head posture with rare min A this date. Trialed oral hold with thin liquids for increased bolus control and containment, with some benefit observed. Introduced effortful swallows, supraglottic swallows, and Mendelsohn exercises today. Additional training required for [facility] next session d/t difficulty completing this date. Occasional coughs with thin liquids noted x3 this session. Reported increased awareness of throat clearing (in absence of PO), with pt able to employ alternatives for ~85% accuracy without cues. Reviewed SOVTE exercises with occasional min A for forward projection and labial seal. Trialed resonant voice exercises, with occasional min A required  to optimize forward resonance. Increased vocal clarity exhibited following voice exercises. Utilized negative feedback practice at word level to maximize awareness of clear vs hoarse voice, with benefit.   02/14/24: Swallow evaluation completed, see above. Pt with teach back of throat clear alternatives, tells SLP she has increased awareness and has been attempting to implement. SLP led pt through vocal warm up using semi-occluded vocal tract exercises using straw in water  for feedback of airflow. Pt completed sustained phonation, sirens, happy birthday with model and min-A to optimize breath support. Clear, strong voicing with good breath flow evidenced. Target voice quality using high resistance phonation exercises. Pt completes sustained phonation, glides, and sentence level speech tasks with usual demonstration of  clear vocal quality, occasional fry at end of exercises. SLP cues for belly breaths, avoiding speaking on residual capacity and increased effort to optimize exercise completion.   02/07/24: Evaluation completed. Initiated HEP for vocal fold atrophy and glottic insufficiency with high intensity voice exercises (PhORTE) - Loud Ah sustained 7 seconds, averaged 85dB with occasional min A - phonation clear 10/10 reps. Glides required usual mod verbal cues and modeling, mild fry at end of low glide 5/10x, other wise clear phonation. Sustaining knoll at low, mid and high pitch with rare min A after initial instruction, clear phonation 3/3 trials. Reading 10 high pitch and low pitch sentences with volume with rare min A, clear phonation. Reading in habitual pitch with intent and volume, clear phonation rare min A 10/10. Trained in throat clear alternatives - initially with frequent mod A to Id throat clears, by end of session she ID's and suppressed 2 throat clears with supervision cues. LPR education and ENT rec for alginate review   PATIENT EDUCATION: Education details: HEP for voice, vocal hygiene,  throat clear alternatives, reflux precautions Person educated: Patient Education method: Explanation, Demonstration, Verbal cues, and Handouts Education comprehension: verbalized understanding, returned demonstration, verbal cues required, and needs further education  HOME EXERCISE PROGRAM: PhoORTE, SOVTE, resonant voice exercises, flow phonation  GOALS: Goals reviewed with patient? Yes  SHORT TERM GOALS: Target date: 03/13/24  Pt will complete HEP for voice with rare min A Baseline: Goal status: MET  2.  Pt will maintain 72dB 18/20 sentences Baseline:  Goal status: MET  3.  Pt will use throat clear alternatives 3/4 opportunities Baseline:  Goal status: ONGOING  4.  Pt will verbalize 3 reflux precautions Baseline:  Goal status: ONGOING  5.  Pt will maintain clear phonation over 5 minute conversation with rare min A Baseline:  Goal status: MET  LONG TERM GOALS: Target date: 04/17/24  Pt will complete HEP with mod I Baseline:  Goal status: ONGOING  2.  Pt will average 70dB over 15  minute conversation Baseline:  Goal status: ONGOING  3.  Pt will clear her throat no more than 2x a sessions Baseline:  Goal status: ONGOING  4.  Pt will maintain clear phonation over 15 minute conversation Baseline:  Goal status: ONGOING  5.  Pt will improve score on VRQOL Baseline: 23 Goal status: ONGOING   ASSESSMENT:  CLINICAL IMPRESSION: Patient is a 63 y.o. female who was seen today for mild dysphonia. Continued education and instruction of voice exercises, vocal hygiene, and swallow precautions. Good carryover exhibited thus far.  I recommend skilled ST to maximize intelligibility and voice quality for safety to be understood, for QOL and to reduce family burden of requesting her to repeat message.   OBJECTIVE IMPAIRMENTS: include voice disorder and dysphagia. These impairments are limiting patient from effectively communicating at home and in community and safety when  swallowing. Factors affecting potential to achieve goals and functional outcome are n/a.SABRA Patient will benefit from skilled SLP services to address above impairments and improve overall function.  REHAB POTENTIAL: Good  PLAN:  SLP FREQUENCY: 1-2x/week  SLP DURATION: 10 weeks  PLANNED INTERVENTIONS: Aspiration precaution training, Pharyngeal strengthening exercises, Diet toleration management , Environmental controls, Trials of upgraded texture/liquids, Cueing hierachy, Internal/external aids, Functional tasks, Multimodal communication approach, SLP instruction and feedback, Compensatory strategies, Patient/family education, and [id number] Treatment of speech (30 or 45 min)  MBSS    Harlene LITTIE Ned, CCC-SLP 02/29/2024, 8:02 AM

## 2024-03-03 NOTE — Therapy (Unsigned)
 OUTPATIENT SPEECH LANGUAGE PATHOLOGY VOICE TREATMENT   Patient Name: Sarah Garrett MRN: [id number] DOB:07-13-1961, 63 y.o., female Today's Date: 03/03/2024  PCP: Zollie Lowers, MD REFERRING PROVIDER: Okey Burns, MD  END OF SESSION:      Past Medical History:  Diagnosis Date   Anemia    Diverticulosis    GERD (gastroesophageal reflux disease)    H/O adenoidectomy    Hemochromatosis 07/07/2011   Hyperlipidemia    Hypertension    Melanoma (HCC)    right shoulder   Thyroid  disease    Past Surgical History:  Procedure Laterality Date   BREAST BIOPSY  07/31/2002   left; benign   COLONOSCOPY WITH PROPOFOL  N/A 06/29/2020   Procedure: COLONOSCOPY WITH PROPOFOL ;  Surgeon: Golda Claudis PENNER, MD;  Location: [facility];  Service: Endoscopy;  Laterality: N/A;   KNEE ARTHROSCOPY W/ MENISCAL REPAIR Right    MELANOMA EXCISION  09/28/2011   right shoulder   TONSILLECTOMY AND ADENOIDECTOMY     Patient Active Problem List   Diagnosis Date Noted   Diverticulitis of intestine with perforation and abscess 03/22/2020   Hypokalemia 03/22/2020   Diverticulitis of large intestine with perforation without bleeding    Morbid obesity (HCC) 09/26/2019   Allergy  to alpha-gal 03/14/2019   Contact dermatitis 03/24/2013   Asthma 03/24/2013   Melanoma of upper arm, right. 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  Rationale for Evaluation and Treatment: Rehabilitation  SUBJECTIVE:   SUBJECTIVE  STATEMENT: continues to endorse raspiness from allergies  Pt accompanied by: self  PERTINENT HISTORY: Sarah Garrett is a 63 year old female who presents with chronic hoarseness and voice changes.   She has experienced hoarseness and a raspy voice for several years, with episodes severe enough to impede her ability to speak. Her symptoms have persisted due to prioritizing care for her mother, who has breast cancer.   She uses Zyrtec  and Astelin  nasal spray for allergies and occasionally uses albuterol  for breathing issues. Recently, she started using Breztri . She has a history of heartburn and reflux, for which she takes Nexium  daily, although she sometimes forgets to take it.    She experiences frequent choking when swallowing, particularly with water . She recalls an incident at church where she choked on a sip of water  but managed to clear her throat herself. She often feels the need to clear her throat.  PAIN:  Are you having pain? Yes: NPRS scale: 3/10 Pain location: knees Pain description: ache Aggravating factors: movement Relieving factors: seeing a doctor  FALLS: Has patient fallen in last 6 months? No, Number of falls: 0  LIVING ENVIRONMENT: Lives with: lives with their family Lives in: House/apartment  PLOF:Level of assistance: Independent with ADLs, Independent with IADLs Employment: Retired  PATIENT GOALS: Not choking and getting my voice to where I'm not raspy and not losing my voice  OBJECTIVE:  Note: Objective measures were completed at Evaluation unless otherwise noted.  DIAGNOSTIC FINDINGS: The nasal cavity was patent without rhinorrhea or polyp. The nasopharynx was also patent without mass or lesion. The base  of tongue was visualized and was normal. There were no signs of pooling of secretions in the piriform sinuses. The true vocal folds were mobile bilaterally. There were no signs of glottic or supraglottic mucosal lesion or mass. There was moderate  interarytenoid pachydermia and post cricoid edema. Flexible scope exam without masses or lesions, but evidence of VF atrophy, glottic insufficiency, post-nasal drainage and GERD LPR. 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  - Refer to speech therapy for voice optimization. - Re-evaluate post-speech therapy for further interventions.  MBSS (02/19/24):  Sarah Garrett presented with intermittent penetration of thin liquids to the level of the vocal folds. There was no aspiration.  Pharyngeal swallow triggered at the level of the pyriform sinuses for most thin liquid boluses - thin liquids tended to spill into the larynx, reaching the vocal folds and prompting throat-clearing or cough.  Pt tended to hold neck in mild extension during testing - when cued to bring chin into neutral position, there were fewer incidents of penetration.  Oral phase was normal. There was sufficient pharyngeal stripping and adequate UES patency (no residue post-swallow). A 13 mm barium pill traveled readily through esophagus.    Recommend that she continue current diet (regular solids/thin liquids).  She will benefit from ongoing SLP tx to address awareness of head posture, preparation for thin bolus arrival, glottal closure prior to bolus arrival.  Swallow Evaluation Recommendations Recommendations: PO diet PO Diet Recommendation: Regular;Thin liquids (Level 0) Liquid Administration via: Cup;Straw Medication Administration: Whole meds with liquid Supervision: Patient able to self-feed Swallowing strategies  : Hold breath before and during swallow (supraglottic swallow);Hold breath during swallow, followed by a cough (super supraglottic swallow);Swallow hold (Mendelsohn maneuver) (These strategies could be tried during therapy sessions to determine benefit) Oral care recommendations: Oral care BID (2x/day)  DYSPHAGIA EVALUATION: SUBJECTIVE DYSPHAGIA REPORTS:  Date of onset: per pt report, several years   Reported symptoms: coughing with liquids, choking with liquids, globus sensation, xerostomia, regurgitation, hoarseness, and heartburn  Current diet: regular and thin liquids  Co-morbid voice changes: Yes Pill administration: takes with liquids, reports sensation of statis at top of throat   FACTORS WHICH MAY INCREASE RISK OF ADVERSE EVENT IN PRESENCE OF ASPIRATION:  General health: well appearing  Risk factors: GERD or other GI disease    ORAL MOTOR EXAMINATION: Overall status: WFL  CLINICAL SWALLOW ASSESSMENT:   Dentition: adequate natural dentition Vocal quality at baseline: hoarse and harsh Patient directly observed with POs: Yes: thin liquids  Feeding: able to feed self Liquids provided by: cup [facility] Swallow Protocol: Pass Oral phase signs and symptoms: oral holding Pharyngeal phase signs and symptoms: multiple swallows and complaints of residue  PATIENT REPORTED OUTCOME MEASURES (PROM): Question Patient's Response  My swallowing problem has caused me to lose weight 0  2.  My swallowing problem interferes with my ability to go out to meals 0  3.  Swallowing liquids takes extra effort 3  4.  Swallowing solids takes extra effort 1  5.  Swallowing pills takes extra effort 1  6.  Swallowing is painful 0  7.  The pleasure of eating is affected by my swallowing 2  8.  When I swallow food sticks in my throat 1  9.  I cough when I eat 0  10.  Swallowing is stressful  2  TOTAL 10  0= No problem 4= Severe problem  TREATMENT DATE:  03/04/24:  02/29/24: Cathrine pt through SOVTE warm-up. Reviewed conversational therapy training techniques with goal of clear, lifted voice. Pt completed paragraph level oral reading with usual hoarseness. SLP revisits rote speech tasks with cues for breath support to obtain improved vocal quality. Repeat paragraph reading with improved  clarity. Target use of strategies in conversation with occasional verbal cues needed to use clear voice. Pt endorses targeted effort at reducing throat clearing, much improved per pt report. Discussed upcoming d/c, pt is agreeable.   02/27/24: Endorsed recent allergies impacting vocal quality and dryness in throat. Utilized SOVTE and resonant exercises as warm up, in which pt able to complete with mod I. Targeted conversation training/clear speech during oral reading and cognitive naming task. Pt able to maintain clear, projected voice at WNL volume given rare min A. Able to maintain clear vocal quality in subsequent conversation with rare min A. Endorsed improved swallow function with neutral head positioning and use of intent. No overt s/sx of aspiration exhibited with cup sips of thin liquids today. No throat clearing observed as well.   02/20/24: Reviewed yesterday's MBSS results and recommendations. Pt demonstrated use of recommended neutral head posture with rare min A this date. Trialed oral hold with thin liquids for increased bolus control and containment, with some benefit observed. Introduced effortful swallows, supraglottic swallows, and Mendelsohn exercises today. Additional training required for Sarah Garrett next session d/t difficulty completing this date. Occasional coughs with thin liquids noted x3 this session. Reported increased awareness of throat clearing (in absence of PO), with pt able to employ alternatives for ~85% accuracy without cues. Reviewed SOVTE exercises with occasional min A for forward projection and labial seal. Trialed resonant voice exercises, with occasional min A required to optimize forward resonance. Increased vocal clarity exhibited following voice exercises. Utilized negative feedback practice at word level to maximize awareness of clear vs hoarse voice, with benefit.   02/14/24: Swallow evaluation completed, see above. Pt with teach back of throat clear alternatives,  tells SLP she has increased awareness and has been attempting to implement. SLP led pt through vocal warm up using semi-occluded vocal tract exercises using straw in water  for feedback of airflow. Pt completed sustained phonation, sirens, happy birthday with model and min-A to optimize breath support. Clear, strong voicing with good breath flow evidenced. Target voice quality using high resistance phonation exercises. Pt completes sustained phonation, glides, and sentence level speech tasks with usual demonstration of clear vocal quality, occasional fry at end of exercises. SLP cues for belly breaths, avoiding speaking on residual capacity and increased effort to optimize exercise completion.   02/07/24: Evaluation completed. Initiated HEP for vocal fold atrophy and glottic insufficiency with high intensity voice exercises (PhORTE) - Loud Ah sustained 7 seconds, averaged 85dB with occasional min A - phonation clear 10/10 reps. Glides required usual mod verbal cues and modeling, mild fry at end of low glide 5/10x, other wise clear phonation. Sustaining knoll at low, mid and high pitch with rare min A after initial instruction, clear phonation 3/3 trials. Reading 10 high pitch and low pitch sentences with volume with rare min A, clear phonation. Reading in habitual pitch with intent and volume, clear phonation rare min A 10/10. Trained in throat clear alternatives - initially with frequent mod A to Id throat clears, by end of session she ID's and suppressed 2 throat clears with supervision cues. LPR education and ENT rec for alginate review   PATIENT EDUCATION: Education details: HEP for voice, vocal hygiene,  throat clear alternatives, reflux precautions Person educated: Patient Education method: Explanation, Demonstration, Verbal cues, and Handouts Education comprehension: verbalized understanding, returned demonstration, verbal cues required, and needs further education  HOME EXERCISE PROGRAM: PhoORTE,  SOVTE, resonant voice exercises, flow phonation  GOALS: Goals reviewed with patient? Yes  SHORT TERM GOALS: Target date: 03/13/24  Pt will complete HEP for voice with rare min A Baseline: Goal status: MET  2.  Pt will maintain 72dB 18/20 sentences Baseline:  Goal status: MET  3.  Pt will use throat clear alternatives 3/4 opportunities Baseline:  Goal status: ONGOING  4.  Pt will verbalize 3 reflux precautions Baseline:  Goal status: ONGOING  5.  Pt will maintain clear phonation over 5 minute conversation with rare min A Baseline:  Goal status: MET  LONG TERM GOALS: Target date: 04/17/24  Pt will complete HEP with mod I Baseline:  Goal status: ONGOING  2.  Pt will average 70dB over 15  minute conversation Baseline:  Goal status: ONGOING  3.  Pt will clear her throat no more than 2x a sessions Baseline:  Goal status: ONGOING  4.  Pt will maintain clear phonation over 15 minute conversation Baseline:  Goal status: ONGOING  5.  Pt will improve score on VRQOL Baseline: 23 Goal status: ONGOING   ASSESSMENT:  CLINICAL IMPRESSION: Patient is a 63 y.o. female who was seen today for mild dysphonia. Continued education and instruction of voice exercises, vocal hygiene, and swallow precautions. Good carryover exhibited thus far.  I recommend skilled ST to maximize intelligibility and voice quality for safety to be understood, for QOL and to reduce family burden of requesting her to repeat message.   OBJECTIVE IMPAIRMENTS: include voice disorder and dysphagia. These impairments are limiting patient from effectively communicating at home and in community and safety when swallowing. Factors affecting potential to achieve goals and functional outcome are n/a.Sarah Garrett Patient will benefit from skilled SLP services to address above impairments and improve overall function.  REHAB POTENTIAL: Good  PLAN:  SLP FREQUENCY: 1-2x/week  SLP DURATION: 10 weeks  PLANNED INTERVENTIONS:  Aspiration precaution training, Pharyngeal strengthening exercises, Diet toleration management , Environmental controls, Trials of upgraded texture/liquids, Cueing hierachy, Internal/external aids, Functional tasks, Multimodal communication approach, SLP instruction and feedback, Compensatory strategies, Patient/family education, and [id number] Treatment of speech (30 or 45 min)  MBSS    Comer LILLETTE Louder, CCC-SLP 03/03/2024, 11:46 AM

## 2024-03-04 ENCOUNTER — Encounter: Payer: Self-pay | Admitting: Internal Medicine

## 2024-03-04 ENCOUNTER — Other Ambulatory Visit: Payer: Self-pay

## 2024-03-04 ENCOUNTER — Ambulatory Visit (INDEPENDENT_AMBULATORY_CARE_PROVIDER_SITE_OTHER): Admitting: Internal Medicine

## 2024-03-04 ENCOUNTER — Other Ambulatory Visit: Payer: Self-pay | Admitting: Internal Medicine

## 2024-03-04 ENCOUNTER — Ambulatory Visit

## 2024-03-04 VITALS — BP 122/88 | HR 77 | Temp 98.2°F | Resp 16 | Ht 64.57 in | Wt 200.9 lb

## 2024-03-04 DIAGNOSIS — K219 Gastro-esophageal reflux disease without esophagitis: Secondary | ICD-10-CM

## 2024-03-04 DIAGNOSIS — J3089 Other allergic rhinitis: Secondary | ICD-10-CM

## 2024-03-04 DIAGNOSIS — R498 Other voice and resonance disorders: Secondary | ICD-10-CM | POA: Diagnosis not present

## 2024-03-04 DIAGNOSIS — J4540 Moderate persistent asthma, uncomplicated: Secondary | ICD-10-CM | POA: Diagnosis not present

## 2024-03-04 DIAGNOSIS — Z91018 Allergy to other foods: Secondary | ICD-10-CM

## 2024-03-04 DIAGNOSIS — R490 Dysphonia: Secondary | ICD-10-CM

## 2024-03-04 DIAGNOSIS — J302 Other seasonal allergic rhinitis: Secondary | ICD-10-CM

## 2024-03-04 MED ORDER — BUDESONIDE 0.5 MG/2ML IN SUSP
RESPIRATORY_TRACT | 1 refills | Status: DC
Start: 2024-03-04 — End: 2024-03-04

## 2024-03-04 NOTE — Patient Instructions (Addendum)
 Moderate Persistent Asthma - Maintenance inhaler: Continue Breztri  160-9-4.68mcg 2 puffs twice daily with spacer. Continue Singulair  10mg  daily  - With respiratory illness or flare ups such as now, start Pulmicort  (Budesonide ) 0.5mg  nebulized twice daily for 1-2 weeks.  - Rescue inhaler: Albuterol  2 puffs via spacer or 1 vial via nebulizer every 4-6 hours as needed for respiratory symptoms of cough, shortness of breath, or wheezing Asthma control goals:  Full participation in all desired activities (may need albuterol  before activity) Albuterol  use two times or less a week on average (not counting use with activity) Cough interfering with sleep two times or less a month Oral steroids no more than once a year No hospitalizations   Allergic Rhinitis: - Positive skin test 11/2022: trees, ragweed, mold, cats, dogs, dust mite, mouse - Use nasal saline rinses before nose sprays such as with Neilmed Sinus Rinse.  Use distilled water .   - Use Flonase  2 sprays each nostril daily. Aim upward and outward. - Use Azelastine  1 spray each nostril twice daily.  Aim upward and outward.   - Use Zyrtec  10 mg daily.  - Use Singulair  10mg  daily. Stop if there are any mood/behavioral changes. - Consider allergy  shots as long term control of your symptoms by teaching your immune system to be more tolerant of your allergy  triggers.  GERD - Continue Nexium  20mg  daily.  Take on empty stomach in the morning.   -Avoid lying down for at least two hours after a meal or after drinking acidic beverages, like soda, or other caffeinated beverages. This can help to prevent stomach contents from flowing back into the esophagus. -Keep your head elevated while you sleep. Using an extra pillow or two can also help to prevent reflux. -Eat smaller and more frequent meals each day instead of a few large meals. This promotes digestion and can aid in preventing heartburn. -Wear loose-fitting clothes to ease pressure on the stomach,  which can worsen heartburn and reflux. -Reduce excess weight around the midsection. This can ease pressure on the stomach. Such pressure can force some stomach contents back up the esophagus.   Alpha Gal Allergy  - please strictly avoid mammalian meat.  - for SKIN only reaction, okay to take Benadryl 25mg  capsules every 6 hours as needed.  - for SKIN + ANY additional symptoms, OR IF concern for LIFE THREATENING reaction = Epipen  Autoinjector EpiPen  0.3 mg. - If using Epinephrine  autoinjector, call 911  or go to the ER.

## 2024-03-04 NOTE — Patient Instructions (Addendum)
 Recommendations: If you are experiencing chronic coughing (I.e., dry coughing), I want you to relax and breathe through it  Sniff-sniff-blow until you can breathe normally OR  Relaxed pursed lip breathing (breathe in and out like a wave) Have a sip of water  to rinse it down  Swallow exercises for 6-8 weeks  Request sooner appointment with ENT if chronic coughing and hoarseness continues

## 2024-03-04 NOTE — Progress Notes (Signed)
 FOLLOW UP Date of Service/Encounter:  03/04/24   Subjective:  Sarah Garrett (DOB: Dec 23, 1960) is a 63 y.o. female who [facility] for follow up for alpha gal, allergic rhinitis, asthma, GERD   History obtained from: chart review and patient. Last visit was with me on 11/20/2023: Asthma: controlled on Breztri  and Singulair  AR: controlled on Flonase , Azelastine , Singulair , Zyrtec  Voice changes/hoarseness: referred to ENT; noted to have VF atrophy, glottic insufficiency, post nasal drainage, GERD.  Age related changes, referred to speech therapy. Continue Nexium , Zyrtec , Azelastine .  Also swallow study for choking/dysphagia.  GERD: Nexium  daily Alpha gal: avoiding red meats   Reports about 1 week ago, was cleaning the linen closet and started with a dry cough.  This has been persistent and worsening.  No fevers, sinus pressure, drainage.  No sick contacts or travel hx.  Cough has been waking her up. Taking Breztri  twice daily and Singulair  daily.  Has been using her Albuterol . No ER/urgent care visits or oral prednisone  use since last visit.  Wondering if she needs antibiotics.   Not too much trouble with allergies in terms of congestion, drainage, runny nose.  Using Flonase , Singulair , Zyrtec  daily; not using Azelastine  regularly.  Doing speech therapy for voice changes/hoarseness and dysphagia.  Reflux is doing well on Nexium . Denies heartburn.   Avoiding mammalian meats.    Past Medical History: Past Medical History:  Diagnosis Date   Anemia    Diverticulosis    GERD (gastroesophageal reflux disease)    H/O adenoidectomy    Hemochromatosis 07/07/2011   Hyperlipidemia    Hypertension    Melanoma (HCC)    right shoulder   Thyroid  disease     Objective:  BP 122/88 (BP Location: Left Wrist, Patient Position: Sitting, Cuff Size: Normal)   Pulse 77   Temp 98.2 F (36.8 C) (Temporal)   Resp 16   Ht 5' 4.57 (1.64 m)   Wt 200 lb  14.4 oz (91.1 kg)   LMP 11/29/2011   SpO2 95%   BMI 33.88 kg/m  Body mass index is 33.88 kg/m. Physical Exam: GEN: alert, well developed HEENT: clear conjunctiva, nose with mild inferior turbinate hypertrophy, pink nasal mucosa, clear rhinorrhea, + cobblestoning HEART: regular rate and rhythm, no murmur LUNGS: clear to auscultation bilaterally, no coughing, unlabored respiration SKIN: no rashes or lesions  Spirometry:  Tracings reviewed. Her effort: Suboptimal effort.  FVC: 2.13L, 73% predicted  FEV1: 1.56L, 68% predicted FEV1/FVC ratio: 73% Interpretation: No obstruction, slightly low FVC, possible restriction.  Please see scanned spirometry results for details.  Assessment:   1. Seasonal and perennial allergic rhinitis   2. Moderate persistent asthma without complication   3. Gastroesophageal reflux disease, unspecified whether esophagitis present   4. Allergy  to alpha-gal   5. Hoarseness     Plan/Recommendations:   Moderate Persistent Asthma - Uncontrolled with recent cough. Spirometry without obstruction. Low suspicion for bacterial infection (no fevers, mucoid drainage, sinus pressure, productive cough).  Will start pulmicort  nebs.   - Maintenance inhaler: Continue Breztri  160-9-4.47mcg 2 puffs twice daily with spacer. Continue Singulair  10mg  daily  - With respiratory illness or flare ups such as now, start Pulmicort  (Budesonide ) 0.5mg  nebulized twice daily for 1-2 weeks.  - Rescue inhaler: Albuterol  2 puffs via spacer or 1 vial via nebulizer every 4-6 hours as needed for respiratory symptoms of cough, shortness of breath, or wheezing Asthma control goals:  Full participation in all desired activities (may need  albuterol  before activity) Albuterol  use two times or less a week on average (not counting use with activity) Cough interfering with sleep two times or less a month Oral steroids no more than once a year No hospitalizations   Allergic Rhinitis: - Controlled   - Positive skin test 11/2022: trees, ragweed, mold, cats, dogs, dust mite, mouse - Use nasal saline rinses before nose sprays such as with Neilmed Sinus Rinse.  Use distilled water .   - Use Flonase  2 sprays each nostril daily. Aim upward and outward. - Use Azelastine  1 spray each nostril twice daily.  Aim upward and outward.   - Use Zyrtec  10 mg daily.  - Use Singulair  10mg  daily. Stop if there are any mood/behavioral changes. - Consider allergy  shots as long term control of your symptoms by teaching your immune system to be more tolerant of your allergy  triggers.  GERD - Controlled  - Continue Nexium  20mg  daily.  Take on empty stomach in the morning.   -Avoid lying down for at least two hours after a meal or after drinking acidic beverages, like soda, or other caffeinated beverages. This can help to prevent stomach contents from flowing back into the esophagus. -Keep your head elevated while you sleep. Using an extra pillow or two can also help to prevent reflux. -Eat smaller and more frequent meals each day instead of a few large meals. This promotes digestion and can aid in preventing heartburn. -Wear loose-fitting clothes to ease pressure on the stomach, which can worsen heartburn and reflux. -Reduce excess weight around the midsection. This can ease pressure on the stomach. Such pressure can force some stomach contents back up the esophagus.   Dysphagia/Voice Changes - Continue follow up with ENT and speech therapy.   Alpha Gal Allergy  - please strictly avoid mammalian meat.  - for SKIN only reaction, okay to take Benadryl 25mg  capsules every 6 hours as needed.  - for SKIN + ANY additional symptoms, OR IF concern for LIFE THREATENING reaction = Epipen  Autoinjector EpiPen  0.3 mg. - If using Epinephrine  autoinjector, call 911  or go to the ER.     Return in about 2 months (around 05/04/2024).  Arleta Blanch, MD [facility] 

## 2024-03-11 ENCOUNTER — Other Ambulatory Visit: Payer: Self-pay | Admitting: Internal Medicine

## 2024-03-13 ENCOUNTER — Other Ambulatory Visit: Payer: Self-pay | Admitting: Internal Medicine

## 2024-03-29 ENCOUNTER — Other Ambulatory Visit: Payer: Self-pay | Admitting: Internal Medicine

## 2024-04-04 ENCOUNTER — Other Ambulatory Visit: Payer: Self-pay | Admitting: Family Medicine

## 2024-04-14 ENCOUNTER — Ambulatory Visit: Admitting: Nurse Practitioner

## 2024-05-05 ENCOUNTER — Ambulatory Visit (INDEPENDENT_AMBULATORY_CARE_PROVIDER_SITE_OTHER): Admitting: Otolaryngology

## 2024-05-05 ENCOUNTER — Telehealth (INDEPENDENT_AMBULATORY_CARE_PROVIDER_SITE_OTHER): Payer: Self-pay

## 2024-05-05 ENCOUNTER — Encounter (INDEPENDENT_AMBULATORY_CARE_PROVIDER_SITE_OTHER): Payer: Self-pay | Admitting: Otolaryngology

## 2024-05-05 VITALS — BP 118/78 | HR 76

## 2024-05-05 DIAGNOSIS — R0981 Nasal congestion: Secondary | ICD-10-CM

## 2024-05-05 DIAGNOSIS — J383 Other diseases of vocal cords: Secondary | ICD-10-CM

## 2024-05-05 DIAGNOSIS — R490 Dysphonia: Secondary | ICD-10-CM

## 2024-05-05 DIAGNOSIS — K219 Gastro-esophageal reflux disease without esophagitis: Secondary | ICD-10-CM

## 2024-05-05 DIAGNOSIS — R0982 Postnasal drip: Secondary | ICD-10-CM | POA: Diagnosis not present

## 2024-05-05 DIAGNOSIS — J3089 Other allergic rhinitis: Secondary | ICD-10-CM

## 2024-05-05 MED ORDER — AZELASTINE HCL 0.1 % NA SOLN: 2.0000 | Freq: Two times a day (BID) | NASAL | 12 refills | Status: DC

## 2024-05-05 MED ORDER — FLUTICASONE PROPIONATE 50 MCG/ACT NA SUSP: 2.0000 | Freq: Every day | NASAL | 5 refills | Status: DC

## 2024-05-05 MED ORDER — CETIRIZINE HCL 10 MG PO TABS: 10.0000 mg | ORAL_TABLET | Freq: Every day | ORAL | 5 refills | Status: DC

## 2024-05-05 NOTE — Telephone Encounter (Signed)
 Called patient to see she could come in earlier. LVM

## 2024-05-05 NOTE — Progress Notes (Signed)
 ENT Progress Note:   Update 05/05/2024  Discussed the use of AI scribe software for clinical note transcription with the patient, who gave verbal consent to proceed.  History of Present Illness Sarah Garrett is a 63 year old female who presents for follow-up after speech therapy.  She has experienced significant improvement in her symptoms following speech therapy. Previously, she would choke on water  due to tilting her head back too far, which caused the flap to open. She has learned to keep her head slightly forward, which has prevented choking episodes. Voice improved as well.     Records Reviewed:  Initial Evaluation  Reason for Consult: hoarseness dysphonia x for years    HPI: Discussed the use of AI scribe software for clinical note transcription with the patient, who gave verbal consent to proceed.  History of Present Illness Sarah Garrett is a 63 year old female who presents with chronic hoarseness and voice changes.  She has experienced hoarseness and a raspy voice for several years, with episodes severe enough to impede her ability to speak. Her symptoms have persisted due to prioritizing care for her mother, who has breast cancer.  She uses Zyrtec  and Astelin  nasal spray for allergies and occasionally uses albuterol  for breathing issues. Recently, she started using Breztri . She has a history of heartburn and reflux, for which she takes Nexium  daily, although she sometimes forgets to take it.   She experiences frequent choking when swallowing, particularly with water . She recalls an incident at church where she choked on a sip of water  but managed to clear her throat herself. She often feels the need to clear her throat.  She has a history of smoking when she was younger but quit long time ago.    Records Reviewed:  Dr [facility] 11/20/23 Montie VEAR Batter (DOB: 03-21-1961) is a 63 y.o. female who [facility] for follow up for alpha gal, allergic rhinitis, asthma, GERD.   History obtained from: chart review and patient. Last visit was with me on 09/21/2023  Asthma/GERD- recent flare up of cough, thought to be GERD related, discussed restarting PPI.  Continue breztri /singulair   AR- uncontrolled, added Azelastine , consider AIT  Alpha gal- avoiding mammalian meat   Reports doing better since last visit.  Not much trouble with cough/wheezing..  Only has had to use Albuterol  twice since last visit, when pollen counts were high. No ER/urgent care/prednisone  use since last visit.   Allergies are doing okay. Uses Zyrtec  and Singulair  daily, nose sprays PRN. Still with raspy voice, no improvement.    Reflux is controlled on Nexium  daily; has not been able to stop it as GERD worsens.     Past Medical History:  Diagnosis Date   Anemia    Diverticulosis    GERD (gastroesophageal reflux disease)    H/O adenoidectomy    Hemochromatosis 07/07/2011   Hyperlipidemia    Hypertension    Melanoma (HCC)    right shoulder   Thyroid  disease     Past Surgical History:  Procedure Laterality Date   BREAST BIOPSY  07/31/2002   left; benign   COLONOSCOPY WITH PROPOFOL  N/A 06/29/2020   Procedure: COLONOSCOPY WITH PROPOFOL ;  Surgeon: Golda Claudis PENNER, MD;  Location: [facility];  Service: Endoscopy;  Laterality: N/A;   KNEE ARTHROSCOPY W/ MENISCAL REPAIR Right    MELANOMA EXCISION  09/28/2011   right shoulder   TONSILLECTOMY AND ADENOIDECTOMY  Family History  Problem Relation Age of Onset   Heart disease Mother        by pass   Breast cancer Mother 1   Colon cancer Neg Hx    Stomach cancer Neg Hx     Social History:  reports that she quit smoking about 46 years ago. Her smoking use included cigarettes. She started smoking about 49 years ago. She has a 0.9 pack-year smoking history. She has been exposed to tobacco smoke. She has never used smokeless tobacco. She reports that she does  not drink alcohol  and does not use drugs.  Allergies:  Allergies  Allergen Reactions   Aspirin Swelling    Swelling of mouth and throat   Ace Inhibitors Other (See Comments)    Per eu:[id number]   Actos [Pioglitazone Hydrochloride] Other (See Comments)    Per pt: unknown   Alpha-Gal Other (See Comments)    unknown   Ciprofloxacin  Other (See Comments)    Unknown reaction   Crestor  [Rosuvastatin  Calcium ] Other (See Comments)    Per pt: unknown   Pneumococcal Vaccines Rash   Sulfonamide Derivatives Other (See Comments)    Swelling of mouth and throat    Medications: I have reviewed the patient's current medications.  The PMH, PSH, Medications, Allergies, and SH were reviewed and updated.  ROS: Constitutional: Negative for fever, weight loss and weight gain. Cardiovascular: Negative for chest pain and dyspnea on exertion. Respiratory: Is not experiencing shortness of breath at rest. Gastrointestinal: Negative for nausea and vomiting. Neurological: Negative for headaches. Psychiatric: The patient is not nervous/anxious  Blood pressure 118/78, pulse 76, last menstrual period 11/29/2011, SpO2 94%. There is no height or weight on file to calculate BMI.  PHYSICAL EXAM:  Exam: General: Well-developed, well-nourished Communication and Voice: raspy Respiratory Respiratory effort: Equal inspiration and expiration without stridor Cardiovascular Peripheral Vascular: Warm extremities with equal color/perfusion Eyes: No nystagmus with equal extraocular motion bilaterally Neuro/Psych/Balance: Patient oriented to person, place, and time; Appropriate mood and affect; Gait is intact with no imbalance; Cranial nerves I-XII are intact Head and Face Inspection: Normocephalic and atraumatic without mass or lesion Palpation: Facial skeleton intact without bony stepoffs Salivary Glands: No mass or tenderness Facial Strength: Facial motility symmetric and full bilaterally ENT Pinna: External  ear intact and fully developed External canal: Canal is patent with intact skin Tympanic Membrane: Clear and mobile External Nose: No scar or anatomic deformity Internal Nose: Septum is deviated to the left. No polyp, or purulence. Mucosal edema and erythema present.  Bilateral inferior turbinate hypertrophy.  Lips, Teeth, and gums: Mucosa and teeth intact and viable TMJ: No pain to palpation with full mobility Oral cavity/oropharynx: No erythema or exudate, no lesions present Neck Neck and Trachea: Midline trachea without mass or lesion Thyroid : No mass or nodularity Lymphatics: No lymphadenopathy  Assessment/Plan: Encounter Diagnoses  Name Primary?   Glottic insufficiency Yes   Dysphonia    Hoarseness    Age-related vocal fold atrophy    Chronic GERD    Chronic nasal congestion    Environmental and seasonal allergies    Post-nasal drip      Assessment and Plan Assessment & Plan Hoarseness for years  Chronic hoarseness, intermittent voice loss. Flexible scope exam without masses or lesions, but evidence of VF atrophy, glottic insufficiency, post-nasal drainage and GERD LPR. 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  - Refer to speech therapy for voice optimization. - Re-evaluate post-speech therapy for further interventions.  Chronic nasal  congestion Environmental allergies and Postnasal drip Postnasal drainage contributing to hoarseness. - Recommend nasal saline rinses. - continue Zyrtec  and Astelin  nasal spray.   Gastroesophageal reflux disease (GERD) Chronic GERD with persistent reflux irritation despite Nexium . - Recommend Reflux Gourmet supplement after meals. - Continue Nexium .  Dysphagia Frequent choking episodes, particularly with water . - Order swallow study. 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 - Consider vocal cord augmentation if sx worsen in the future   Chronic nasal congestion and postnasal drip Continue current regimen - Prescribed Zyrtec . - Prescribed Flonase . - Prescribed Astelin .  Gastroesophageal reflux disease Managed to control factors affecting dysphagia and postnasal drip. - Continue management of reflux as part of overall treatment plan.      Thank you for allowing me to participate in the care of this patient. Please do not hesitate to contact me with any questions or concerns.   Elena Larry, MD Otolaryngology [facility] Phone: [phone] Fax: [phone]    05/05/2024, 11:45 AM

## 2024-05-06 ENCOUNTER — Other Ambulatory Visit: Payer: Self-pay

## 2024-05-06 ENCOUNTER — Ambulatory Visit: Admitting: Internal Medicine

## 2024-05-06 ENCOUNTER — Encounter: Payer: Self-pay | Admitting: Internal Medicine

## 2024-05-06 VITALS — BP 110/80 | HR 66 | Temp 98.3°F | Ht 63.5 in | Wt 196.5 lb

## 2024-05-06 DIAGNOSIS — R490 Dysphonia: Secondary | ICD-10-CM

## 2024-05-06 DIAGNOSIS — Z91018 Allergy to other foods: Secondary | ICD-10-CM

## 2024-05-06 DIAGNOSIS — J4540 Moderate persistent asthma, uncomplicated: Secondary | ICD-10-CM | POA: Diagnosis not present

## 2024-05-06 DIAGNOSIS — J3089 Other allergic rhinitis: Secondary | ICD-10-CM

## 2024-05-06 DIAGNOSIS — J302 Other seasonal allergic rhinitis: Secondary | ICD-10-CM

## 2024-05-06 DIAGNOSIS — K219 Gastro-esophageal reflux disease without esophagitis: Secondary | ICD-10-CM | POA: Diagnosis not present

## 2024-05-06 MED ORDER — FLUTICASONE PROPIONATE 50 MCG/ACT NA SUSP: 2.0000 | Freq: Every day | NASAL | 1 refills | Status: DC

## 2024-05-06 MED ORDER — EPINEPHRINE 0.3 MG/0.3ML IJ SOAJ: 0.3000 mg | INTRAMUSCULAR | 1 refills | Status: AC | PRN

## 2024-05-06 MED ORDER — MONTELUKAST SODIUM 10 MG PO TABS: 10.0000 mg | ORAL_TABLET | Freq: Every day | ORAL | 1 refills | Status: DC

## 2024-05-06 MED ORDER — ALBUTEROL SULFATE (2.5 MG/3ML) 0.083% IN NEBU: 2.5000 mg | INHALATION_SOLUTION | Freq: Four times a day (QID) | RESPIRATORY_TRACT | 1 refills | Status: DC | PRN

## 2024-05-06 MED ORDER — ALBUTEROL SULFATE HFA 108 (90 BASE) MCG/ACT IN AERS: 1.0000 | INHALATION_SPRAY | Freq: Four times a day (QID) | RESPIRATORY_TRACT | 1 refills | Status: DC | PRN

## 2024-05-06 MED ORDER — BREZTRI AEROSPHERE 160-9-4.8 MCG/ACT IN AERO: 2.0000 | INHALATION_SPRAY | Freq: Two times a day (BID) | RESPIRATORY_TRACT | 5 refills | Status: DC

## 2024-05-06 MED ORDER — CETIRIZINE HCL 10 MG PO TABS: 10.0000 mg | ORAL_TABLET | Freq: Every day | ORAL | 1 refills | Status: DC

## 2024-05-06 NOTE — Patient Instructions (Addendum)
 Moderate Persistent Asthma - Maintenance inhaler: Continue Breztri  160-9-4.40mcg 2 puffs twice daily with spacer. Continue Singulair  10mg  daily  - Rescue inhaler: Albuterol  2 puffs via spacer or 1 vial via nebulizer every 4-6 hours as needed for respiratory symptoms of cough, shortness of breath, or wheezing Asthma control goals:  Full participation in all desired activities (may need albuterol  before activity) Albuterol  use two times or less a week on average (not counting use with activity) Cough interfering with sleep two times or less a month Oral steroids no more than once a year No hospitalizations   Allergic Rhinitis: - Positive skin test 11/2022: trees, ragweed, mold, cats, dogs, dust mite, mouse - Use nasal saline rinses before nose sprays such as with Neilmed Sinus Rinse.  Use distilled water .   - Use Flonase  2 sprays each nostril daily. Aim upward and outward. - Use Azelastine  1 spray each nostril twice daily.  Aim upward and outward.   - Use Zyrtec  10 mg daily.  - Use Singulair  10mg  daily. Stop if there are any mood/behavioral changes. - Consider allergy  shots as long term control of your symptoms by teaching your immune system to be more tolerant of your allergy  triggers.  Discuss cost and coverage with insurance company and if interested in starting, please call us  back so we can mix the vials (traditional, 2 vials, 2 shots).  GERD - Continue Nexium  20mg  daily.  Take on empty stomach in the morning.   -Avoid lying down for at least two hours after a meal or after drinking acidic beverages, like soda, or other caffeinated beverages. This can help to prevent stomach contents from flowing back into the esophagus. -Keep your head elevated while you sleep. Using an extra pillow or two can also help to prevent reflux. -Eat smaller and more frequent meals each day instead of a few large meals. This promotes digestion and can aid in preventing heartburn. -Wear loose-fitting clothes to  ease pressure on the stomach, which can worsen heartburn and reflux. -Reduce excess weight around the midsection. This can ease pressure on the stomach. Such pressure can force some stomach contents back up the esophagus.   Alpha Gal Allergy  - please strictly avoid mammalian meat.  - for SKIN only reaction, okay to take Benadryl 25mg  capsules every 6 hours as needed.  - for SKIN + ANY additional symptoms, OR IF concern for LIFE THREATENING reaction = Epipen  Autoinjector EpiPen  0.3 mg. - If using Epinephrine  autoinjector, call 911  or go to the ER.   Dysphagia/Voice Changes/Hoarseness  - Continue speech therapy exercises.  If worsening, ENT will consider vocal cord augmentation.   Motion Sickness - Dramamine Motion Sickness Less Drowsy Tablets

## 2024-05-06 NOTE — Progress Notes (Signed)
 FOLLOW UP Date of Service/Encounter:  05/06/24   Subjective:  Sarah Garrett (DOB: June 04, 1961) is a 63 y.o. female who [facility] for follow up for alpha gal, allergic rhinitis, asthma, GERD   History obtained from: chart review and patient. Last seen on 03/04/2024 with me for worsening cough, started on Pulmicort  nebs for 1-2 weeks.  Also on Breztri , Singulair , Flonase , Azelastine , Zyrtec , Nexium .   She is followed by ENT and speech therapy for voice changes/hoarseness/glottic insufficiency.    She had COVID in September and took Paxlovid and did not have breathing issues.  Still taking Breztri  and Singulair .  No ER/oral prednisone  use since last visit. No albuterol  use since last visit. Doing well but reports flaring up easily throughout the years especially with illness and when allergies flare.  Does note some congestion, drainage on and off but doing okay.  Using Flonase , Azelastine , Zyrtec , Singulair  daily.  Was on AIT in the past many years ago but would reconsider it depending on cost.  Notes improvement in voice/hoarseness with speech therapy. No longer having trouble swallowing.   Reflux is fine too on Nexium .  Denies much heartburn, sour taste in mouth, AM cough.    Continues avoiding mammalian meat, no accidental exposure. Has an Epipen .  Asking about motion sickness treatment while going to [facility] [place] for vacation.   Past Medical History: Past Medical History:  Diagnosis Date   Anemia    Diverticulosis    GERD (gastroesophageal reflux disease)    H/O adenoidectomy    Hemochromatosis 07/07/2011   Hyperlipidemia    Hypertension    Melanoma (HCC)    right shoulder   Thyroid  disease     Objective:  BP 110/80 (BP Location: Left Arm, Patient Position: Sitting, Cuff Size: Normal)   Pulse 66   Temp 98.3 F (36.8 C) (Temporal)   Ht 5' 3.5 (1.613 m)   Wt 196 lb 8 oz (89.1 kg)   LMP 11/29/2011   SpO2 95%   BMI 34.26  kg/m  Body mass index is 34.26 kg/m. Physical Exam: GEN: alert, well developed HEENT: clear conjunctiva, nose with mild inferior turbinate hypertrophy, pink nasal mucosa, + clear rhinorrhea, + cobblestoning HEART: regular rate and rhythm, no murmur LUNGS: clear to auscultation bilaterally, no coughing, unlabored respiration SKIN: no rashes or lesions  Spirometry:  Tracings reviewed. Her effort: Good reproducible efforts. FVC: 2.33L, 80% predicted  FEV1: 1.56L, 69% predicted FEV1/FVC ratio: 67% Interpretation: Spirometry consistent with normal pattern.  Please see scanned spirometry results for details.  Assessment:   1. Seasonal and perennial allergic rhinitis   2. Gastroesophageal reflux disease, unspecified whether esophagitis present   3. Allergy  to alpha-gal   4. Moderate persistent asthma without complication   5. Dysphonia   6. Hoarseness     Plan/Recommendations:  Moderate Persistent Asthma - Controlled but flares up easily when allergies flare.  Spirometry today with  - Maintenance inhaler: Continue Breztri  160-9-4.45mcg 2 puffs twice daily with spacer. Continue Singulair  10mg  daily  - Rescue inhaler: Albuterol  2 puffs via spacer or 1 vial via nebulizer every 4-6 hours as needed for respiratory symptoms of cough, shortness of breath, or wheezing Asthma control goals:  Full participation in all desired activities (may need albuterol  before activity) Albuterol  use two times or less a week on average (not counting use with activity) Cough interfering with sleep two times or less a month Oral steroids no more than once a year No  hospitalizations   Allergic Rhinitis: - Not well controlled, consider AIT.   - Positive skin test 11/2022: trees, ragweed, mold, cats, dogs, dust mite, mouse - Use nasal saline rinses before nose sprays such as with Neilmed Sinus Rinse.  Use distilled water .   - Use Flonase  2 sprays each nostril daily. Aim upward and outward. - Use Azelastine   1 spray each nostril twice daily.  Aim upward and outward.   - Use Zyrtec  10 mg daily.  - Use Singulair  10mg  daily. Stop if there are any mood/behavioral changes. - Consider allergy  shots as long term control of your symptoms by teaching your immune system to be more tolerant of your allergy  triggers.  Discuss cost and coverage with insurance company and if interested in starting, please call us  back so we can mix the vials (traditional, 2 vials, 2 shots).  GERD - Controlled  - Continue Nexium  20mg  daily.  Take on empty stomach in the morning.   -Avoid lying down for at least two hours after a meal or after drinking acidic beverages, like soda, or other caffeinated beverages. This can help to prevent stomach contents from flowing back into the esophagus. -Keep your head elevated while you sleep. Using an extra pillow or two can also help to prevent reflux. -Eat smaller and more frequent meals each day instead of a few large meals. This promotes digestion and can aid in preventing heartburn. -Wear loose-fitting clothes to ease pressure on the stomach, which can worsen heartburn and reflux. -Reduce excess weight around the midsection. This can ease pressure on the stomach. Such pressure can force some stomach contents back up the esophagus.   Alpha Gal Allergy  - please strictly avoid mammalian meat.  - initial rxn: recurrent hives around 2020 that resolved with mammalian meat avoidance  - sIgE 06/2023: alpha gal 0.63 - for SKIN only reaction, okay to take Benadryl 25mg  capsules every 6 hours as needed.  - for SKIN + ANY additional symptoms, OR IF concern for LIFE THREATENING reaction = Epipen  Autoinjector EpiPen  0.3 mg. - If using Epinephrine  autoinjector, call 911  or go to the ER.   Dysphagia/Voice Changes/Hoarseness  - Continue speech therapy exercises.  If worsening, ENT will consider vocal cord augmentation.   Motion Sickness - Dramamine Motion Sickness Less Drowsy Tablets    Return  in about 3 months (around 08/06/2024).  Arleta Blanch, MD [facility] 

## 2024-05-09 ENCOUNTER — Other Ambulatory Visit: Payer: Self-pay | Admitting: Internal Medicine

## 2024-05-09 DIAGNOSIS — J301 Allergic rhinitis due to pollen: Secondary | ICD-10-CM

## 2024-05-09 DIAGNOSIS — J3089 Other allergic rhinitis: Secondary | ICD-10-CM

## 2024-05-09 NOTE — Progress Notes (Signed)
 AIT Rx sent.

## 2024-05-12 DIAGNOSIS — J301 Allergic rhinitis due to pollen: Secondary | ICD-10-CM | POA: Diagnosis not present

## 2024-05-12 DIAGNOSIS — J3081 Allergic rhinitis due to animal (cat) (dog) hair and dander: Secondary | ICD-10-CM | POA: Diagnosis not present

## 2024-05-12 DIAGNOSIS — J3089 Other allergic rhinitis: Secondary | ICD-10-CM | POA: Diagnosis not present

## 2024-05-12 NOTE — Progress Notes (Signed)
 Aeroallergen Immunotherapy  Ordering Provider: priya patel  Patient Details Name: SENORA LACSON MRN: [id number] Date of Birth: 1960/11/16  Order 1 of 2  Vial Label: W,T,DM,C,D  0.3 ml (Volume)  1:20 Concentration -- Ragweed Mix 0.5 ml (Volume)  1:20 Concentration -- Eastern 10 Tree Mix (also Sweet Gum) 0.5 ml (Volume)  1:10 Concentration -- Cat Hair 0.5 ml (Volume)  1:10 Concentration -- Dog Epithelia 0.5 ml (Volume)   AU Concentration -- Mite Mix (DF 5,000 & DP 5,000)   2.3  ml Extract Subtotal 2.7  ml Diluent  5.0  ml Maintenance Total  Schedule:  C Silver Vial (1:1,000,000): Schedule C (5 doses) Blue Vial (1:100,000): Schedule C (5 doses) Yellow Vial (1:10,000): Schedule C (5 doses) Green Vial (1:1,000): Schedule C (5 doses) Red Vial (1:100): Schedule A (10 doses)  Special Instructions: bring epipen 

## 2024-05-12 NOTE — Progress Notes (Signed)
 VIALS MADE ON 05/12/24

## 2024-05-12 NOTE — Progress Notes (Signed)
 Aeroallergen Immunotherapy  Ordering Provider: priya patel  Patient Details Name: SHYASIA FUNCHES MRN: [id number] Date of Birth: 1960/12/06  Order 2 of 2  Vial Label: mold  0.2 ml (Volume)  1:20 Concentration -- Bipolaris sorokiniana 0.2 ml (Volume)  1:20 Concentration -- Drechslera spicifera 0.2 ml (Volume)  1:10 Concentration -- Mucor plumbeus 0.2 ml (Volume)  1:10 Concentration -- Fusarium moniliforme 0.2 ml (Volume)  1:40 Concentration -- Aureobasidium pullulans 0.2 ml (Volume)  1:10 Concentration -- Rhizopus oryzae   1.2  ml Extract Subtotal 3.8  ml Diluent  5.0  ml Maintenance Total  Schedule:  C Silver Vial (1:1,000,000): Schedule C (5 doses) Blue Vial (1:100,000): Schedule C (5 doses) Yellow Vial (1:10,000): Schedule C (5 doses) Green Vial (1:1,000): Schedule C (5 doses) Red Vial (1:100): Schedule A (10 doses)  Special Instructions: bring epipen 

## 2024-05-13 ENCOUNTER — Other Ambulatory Visit: Payer: Self-pay | Admitting: Family Medicine

## 2024-05-13 ENCOUNTER — Telehealth: Payer: Self-pay | Admitting: Family Medicine

## 2024-05-13 DIAGNOSIS — Z1231 Encounter for screening mammogram for malignant neoplasm of breast: Secondary | ICD-10-CM

## 2024-05-13 DIAGNOSIS — J302 Other seasonal allergic rhinitis: Secondary | ICD-10-CM | POA: Diagnosis not present

## 2024-05-13 NOTE — Telephone Encounter (Unsigned)
 Copied from CRM [id number]. Topic: Clinical - Medication Refill >> May 13, 2024 12:19 PM Zebedee SAUNDERS wrote: Medication: scopolamine transdermal system   Has the patient contacted their pharmacy? Yes (Agent: If no, request that the patient contact the pharmacy for the refill. If patient does not wish to contact the pharmacy document the reason why and proceed with request.) (Agent: If yes, when and what did the pharmacy advise?)  This is the patient's preferred pharmacy:  [id number] - [place], [place] - [address] [place] [address] Phone: [phone] Fax: [phone]  Is this the correct pharmacy for this prescription? Yes If no, delete pharmacy and type the correct one.   Has the prescription been filled recently? Yes  Is the patient out of the medication? Yes  Has the patient been seen for an appointment in the last year OR does the patient have an upcoming appointment? Yes  Can we respond through MyChart? Yes  Agent: Please be advised that Rx refills may take up to 3 business days. We ask that you follow-up with your pharmacy.

## 2024-05-15 ENCOUNTER — Other Ambulatory Visit: Payer: Self-pay | Admitting: Family Medicine

## 2024-05-15 ENCOUNTER — Telehealth: Payer: Self-pay | Admitting: Family Medicine

## 2024-05-15 ENCOUNTER — Ambulatory Visit: Admitting: Family Medicine

## 2024-05-15 NOTE — Telephone Encounter (Signed)
 Copied from CRM [id number]. Topic: Clinical - Medication Question >> May 15, 2024  4:55 PM Santiya F wrote: Reason for CRM: Patient is calling in requesting medication to prevent her from getting sick when she goes to [employer]. Patient is requesting scopolamine. Patient is leaving this weekend. She says it's a patch.

## 2024-05-16 MED ORDER — SCOPOLAMINE 1 MG/3DAYS TD PT72: 1.0000 | MEDICATED_PATCH | TRANSDERMAL | 0 refills | Status: AC

## 2024-05-16 NOTE — Telephone Encounter (Signed)
 [facility] to [facility]

## 2024-05-16 NOTE — Telephone Encounter (Signed)
 Pt made aware.

## 2024-05-28 ENCOUNTER — Encounter: Payer: Self-pay | Admitting: Family Medicine

## 2024-05-28 ENCOUNTER — Other Ambulatory Visit: Payer: Self-pay | Admitting: Family Medicine

## 2024-05-28 ENCOUNTER — Ambulatory Visit (INDEPENDENT_AMBULATORY_CARE_PROVIDER_SITE_OTHER): Admitting: Family Medicine

## 2024-05-28 VITALS — BP 130/76 | HR 77 | Temp 97.2°F | Ht 63.5 in | Wt 196.0 lb

## 2024-05-28 DIAGNOSIS — E7800 Pure hypercholesterolemia, unspecified: Secondary | ICD-10-CM

## 2024-05-28 DIAGNOSIS — E039 Hypothyroidism, unspecified: Secondary | ICD-10-CM

## 2024-05-28 DIAGNOSIS — K219 Gastro-esophageal reflux disease without esophagitis: Secondary | ICD-10-CM

## 2024-05-28 DIAGNOSIS — I10 Essential (primary) hypertension: Secondary | ICD-10-CM

## 2024-05-28 DIAGNOSIS — Z23 Encounter for immunization: Secondary | ICD-10-CM

## 2024-05-28 MED ORDER — LOSARTAN POTASSIUM-HCTZ 100-12.5 MG PO TABS: 1.0000 | ORAL_TABLET | Freq: Every day | ORAL | 3 refills | Status: AC

## 2024-05-28 MED ORDER — LEVOTHYROXINE SODIUM 125 MCG PO TABS: 125.0000 ug | ORAL_TABLET | Freq: Every day | ORAL | 3 refills | Status: AC

## 2024-05-28 MED ORDER — ICOSAPENT ETHYL 1 G PO CAPS: 1.0000 g | ORAL_CAPSULE | Freq: Two times a day (BID) | ORAL | 3 refills | Status: AC

## 2024-05-28 MED ORDER — ROSUVASTATIN CALCIUM 20 MG PO TABS: ORAL_TABLET | ORAL | 3 refills | Status: AC

## 2024-05-28 MED ORDER — FUROSEMIDE 40 MG PO TABS: 40.0000 mg | ORAL_TABLET | Freq: Every day | ORAL | 3 refills | Status: AC | PRN

## 2024-05-28 NOTE — Progress Notes (Signed)
 Subjective:  Patient ID: Sarah Garrett, female    DOB: 12-Mar-1961  Age: 63 y.o. MRN: [id number]  CC: Medical Management of Chronic Issues (No concerns/Would like to discuss shingles vaccine)   HPI  Discussed the use of AI scribe software for clinical note transcription with the patient, who gave verbal consent to proceed.  History of Present Illness Sarah Garrett is a 64 year old female who presents for a routine check-up.  She is managing her stress well and has been following up for acid reflux, which is controlled with Nexium  20 mg daily and a non-prescription syrup.  She has a history of hyperthyroidism and hypertension. Her energy levels have been stable since recovering from COVID-19 in September, during which she was very sick. She has resumed her voice therapy exercises after recovering from COVID-19, which had temporarily hindered her progress.  She experienced shingles three to four years ago and a possible recurrence about five months ago, treated with valacyclovir . She is considering the shingles vaccine now that sufficient time has passed since the last episode.  She is taking rosuvastatin  20 mg daily for hyperlipidemia and Vascepa  for cholesterol management, with no issues reported. She also takes potassium supplements and has not experienced bruising or bleeding.  She recently visited [employer] in [place]  and was able to walk for ten to twelve hours, indicating good physical stamina.   follow-up on  thyroid . The patient has a history of hypothyroidism for many years. It has been stable recently. Pt. denies any change in  voice, loss of hair, heat or cold intolerance. Energy level has been adequate to good. Patient denies constipation and diarrhea. No myxedema. Medication is as noted below. Verified that pt is taking it daily on an empty stomach. Well tolerated.         05/28/2024    1:55 PM 05/17/2023   11:12 AM 11/16/2022    4:14 PM  Depression screen  PHQ 2/9  Decreased Interest 0 0 0  Down, Depressed, Hopeless 0 0 0  PHQ - 2 Score 0 0 0  Altered sleeping 0    Tired, decreased energy 0    Change in appetite 0    Feeling bad or failure about yourself  0    Trouble concentrating 0    Moving slowly or fidgety/restless 0    Suicidal thoughts 0    PHQ-9 Score 0    Difficult doing work/chores Not difficult at all      History [name] has a past medical history of Anemia, Diverticulosis, GERD (gastroesophageal reflux disease), H/O adenoidectomy, Hemochromatosis (07/07/2011), Hyperlipidemia, Hypertension, Melanoma (HCC), and Thyroid  disease.   She has a past surgical history that includes Tonsillectomy and adenoidectomy; Breast biopsy (07/31/2002); Melanoma excision (09/28/2011); Colonoscopy with propofol  (N/A, 06/29/2020); and Knee arthroscopy w/ meniscal repair (Right).   Her family history includes Breast cancer (age of onset: 38) in her mother; Heart disease in her mother.She reports that she quit smoking about 46 years ago. Her smoking use included cigarettes. She started smoking about 49 years ago. She has a 0.9 pack-year smoking history. She has been exposed to tobacco smoke. She has never used smokeless tobacco. She reports that she does not drink alcohol  and does not use drugs.    ROS Review of Systems  Constitutional: Negative.   HENT:  Negative for congestion.   Eyes:  Negative for visual disturbance.  Respiratory:  Negative for shortness of breath.   Cardiovascular:  Negative for chest pain.  Gastrointestinal:  Negative for abdominal pain, constipation, diarrhea, nausea and vomiting.  Genitourinary:  Negative for difficulty urinating.  Musculoskeletal:  Negative for arthralgias and myalgias.  Neurological:  Negative for headaches.  Psychiatric/Behavioral:  Negative for sleep disturbance.     Objective:  BP 130/76   Pulse 77   Temp (!) 97.2 F (36.2 C)   Ht 5' 3.5 (1.613 m)   Wt 196 lb (88.9 kg)   LMP 11/29/2011    SpO2 93%   BMI 34.18 kg/m   BP Readings from Last 3 Encounters:  05/28/24 130/76  05/06/24 110/80  05/05/24 118/78    Wt Readings from Last 3 Encounters:  05/28/24 196 lb (88.9 kg)  05/06/24 196 lb 8 oz (89.1 kg)  03/04/24 200 lb 14.4 oz (91.1 kg)     Physical Exam Physical Exam GENERAL: Alert, cooperative, well developed, no acute distress HEENT: Normocephalic, normal oropharynx, moist mucous membranes NECK: No cervical lymphadenopathy CHEST: Clear to auscultation bilaterally, no wheezes, rhonchi, or crackles CARDIOVASCULAR: Normal heart rate and rhythm, S1 and S2 normal without murmurs, heart sounds normal ABDOMEN: Soft, non-tender, non-distended, without organomegaly, normal bowel sounds EXTREMITIES: No cyanosis or edema NEUROLOGICAL: Cranial nerves grossly intact, moves all extremities without gross motor or sensory deficit   Assessment & Plan:  Acquired hypothyroidism -     TSH + free T4 -     CBC with Differential/Platelet  Pure hypercholesterolemia -     Icosapent  Ethyl; Take 1 capsule (1 g total) by mouth 2 (two) times daily.  Dispense: 180 capsule; Refill: 3 -     Rosuvastatin  Calcium ; TAKE 1 TABLET BY MOUTH EVERY DAY  Dispense: 90 tablet; Refill: 3 -     Lipid panel -     Comprehensive metabolic panel with GFR  Essential hypertension -     Furosemide ; Take 1 tablet (40 mg total) by mouth daily as needed.  Dispense: 90 tablet; Refill: 3 -     Losartan  Potassium-HCTZ; Take 1 tablet by mouth daily. as directed  Dispense: 90 tablet; Refill: 3 -     CBC with Differential/Platelet  Gastroesophageal reflux disease, unspecified whether esophagitis present -     CBC with Differential/Platelet  Other orders -     Levothyroxine  Sodium; Take 1 tablet (125 mcg total) by mouth daily.  Dispense: 90 tablet; Refill: 3    Assessment and Plan Assessment & Plan Essential hypertension  Pure hypercholesterolemia   Cholesterol levels are well-managed with rosuvastatin  and  Vascepa , both well-tolerated. Continue rosuvastatin  20 mg orally daily and Vascepa  1 g orally twice daily.  Acquired hypothyroidism   Thyroid  function is stable with consistent energy levels post-COVID recovery. Order blood work to assess thyroid  function.  Gastroesophageal reflux disease (GERD)   GERD is well-controlled with esomeprazole . Continue esomeprazole  20 mg orally daily and consider non-prescription treatment as recommended by Dr. Soldatova.  Shingles, status post possible recurrence   Presumptive diagnosis of shingles occurred approximately five months ago. Administer shingles vaccine as vaccination is now appropriate.       Follow-up: Return in about 6 months (around 11/26/2024) for [facility] physical.  Butler Der, M.D.

## 2024-05-28 NOTE — Addendum Note (Signed)
 Addended by: SANDA REA SQUIBB on: 05/28/2024 02:47 PM   Modules accepted: Orders

## 2024-05-29 ENCOUNTER — Ambulatory Visit: Payer: Self-pay | Admitting: Family Medicine

## 2024-05-29 LAB — TSH+FREE T4
Free T4: 1.5 ng/dL (ref 0.82–1.77)
TSH: 2.5 u[IU]/mL (ref 0.450–4.500)

## 2024-05-29 LAB — CBC WITH DIFFERENTIAL/PLATELET
Basophils Absolute: 0.1 x10E3/uL (ref 0.0–0.2)
Basos: 1 %
EOS (ABSOLUTE): 0.1 x10E3/uL (ref 0.0–0.4)
Eos: 1 %
Hematocrit: 47.8 % — ABNORMAL HIGH (ref 34.0–46.6)
Hemoglobin: 15.8 g/dL (ref 11.1–15.9)
Immature Grans (Abs): 0 x10E3/uL (ref 0.0–0.1)
Immature Granulocytes: 0 %
Lymphocytes Absolute: 2.9 x10E3/uL (ref 0.7–3.1)
Lymphs: 29 %
MCH: 31 pg (ref 26.6–33.0)
MCHC: 33.1 g/dL (ref 31.5–35.7)
MCV: 94 fL (ref 79–97)
Monocytes Absolute: 0.6 x10E3/uL (ref 0.1–0.9)
Monocytes: 6 %
Neutrophils Absolute: 6.4 x10E3/uL (ref 1.4–7.0)
Neutrophils: 63 %
Platelets: 200 x10E3/uL (ref 150–450)
RBC: 5.09 x10E6/uL (ref 3.77–5.28)
RDW: 14.5 % (ref 11.7–15.4)
WBC: 10 x10E3/uL (ref 3.4–10.8)

## 2024-05-29 LAB — COMPREHENSIVE METABOLIC PANEL WITH GFR
ALT: 20 IU/L (ref 0–32)
AST: 19 IU/L (ref 0–40)
Albumin: 4.8 g/dL (ref 3.9–4.9)
Alkaline Phosphatase: 83 IU/L (ref 49–135)
BUN/Creatinine Ratio: 14 (ref 12–28)
BUN: 12 mg/dL (ref 8–27)
Bilirubin Total: 0.7 mg/dL (ref 0.0–1.2)
CO2: 25 mmol/L (ref 20–29)
Calcium: 10 mg/dL (ref 8.7–10.3)
Chloride: 100 mmol/L (ref 96–106)
Creatinine, Ser: 0.83 mg/dL (ref 0.57–1.00)
Globulin, Total: 2.5 g/dL (ref 1.5–4.5)
Glucose: 98 mg/dL (ref 70–99)
Potassium: 3.4 mmol/L — ABNORMAL LOW (ref 3.5–5.2)
Sodium: 143 mmol/L (ref 134–144)
Total Protein: 7.3 g/dL (ref 6.0–8.5)
eGFR: 78 mL/min/1.73 (ref >59)

## 2024-05-29 LAB — LIPID PANEL
Chol/HDL Ratio: 2.9 ratio (ref 0.0–4.4)
Cholesterol, Total: 166 mg/dL (ref 100–199)
HDL: 57 mg/dL (ref >39)
LDL Chol Calc (NIH): 83 mg/dL (ref 0–99)
Triglycerides: 153 mg/dL — ABNORMAL HIGH (ref 0–149)
VLDL Cholesterol Cal: 26 mg/dL (ref 5–40)

## 2024-05-29 NOTE — Progress Notes (Signed)
Hello Sarah Garrett,  Your lab result is normal and/or stable.Some minor variations that are not significant are commonly marked abnormal, but do not represent any medical problem for you.  Best regards, Yahir Tavano, M.D.

## 2024-06-03 ENCOUNTER — Ambulatory Visit (INDEPENDENT_AMBULATORY_CARE_PROVIDER_SITE_OTHER)

## 2024-06-03 DIAGNOSIS — J309 Allergic rhinitis, unspecified: Secondary | ICD-10-CM

## 2024-06-03 NOTE — Progress Notes (Signed)
 Immunotherapy   Patient Details  Name: Sarah Garrett MRN: [id number] Date of Birth: [birth year]  06/03/2024  Sarah Garrett Batter started injections for  Mold and Weed-Tree-Dustmite-Cat-Dog Following schedule: C  Frequency: Twice weekly Epi-Pen:Epi-Pen Available  Consent signed and patient instructions given. Patient received 2 injections and waited in office for 30 minutes with no issues.    Sarah Garrett Shed 06/03/2024, 1:53 PM

## 2024-06-09 ENCOUNTER — Ambulatory Visit (INDEPENDENT_AMBULATORY_CARE_PROVIDER_SITE_OTHER)

## 2024-06-09 DIAGNOSIS — J309 Allergic rhinitis, unspecified: Secondary | ICD-10-CM | POA: Diagnosis not present

## 2024-06-11 ENCOUNTER — Ambulatory Visit

## 2024-06-11 DIAGNOSIS — J309 Allergic rhinitis, unspecified: Secondary | ICD-10-CM | POA: Diagnosis not present

## 2024-06-16 ENCOUNTER — Ambulatory Visit (INDEPENDENT_AMBULATORY_CARE_PROVIDER_SITE_OTHER)

## 2024-06-16 DIAGNOSIS — J309 Allergic rhinitis, unspecified: Secondary | ICD-10-CM | POA: Diagnosis not present

## 2024-06-19 ENCOUNTER — Ambulatory Visit

## 2024-06-19 DIAGNOSIS — J309 Allergic rhinitis, unspecified: Secondary | ICD-10-CM

## 2024-06-25 ENCOUNTER — Ambulatory Visit (INDEPENDENT_AMBULATORY_CARE_PROVIDER_SITE_OTHER)

## 2024-06-25 DIAGNOSIS — J309 Allergic rhinitis, unspecified: Secondary | ICD-10-CM

## 2024-06-30 ENCOUNTER — Ambulatory Visit

## 2024-06-30 DIAGNOSIS — J309 Allergic rhinitis, unspecified: Secondary | ICD-10-CM

## 2024-07-02 ENCOUNTER — Other Ambulatory Visit (HOSPITAL_COMMUNITY): Payer: Self-pay

## 2024-07-03 ENCOUNTER — Ambulatory Visit: Admitting: *Deleted

## 2024-07-03 DIAGNOSIS — J309 Allergic rhinitis, unspecified: Secondary | ICD-10-CM

## 2024-07-03 DIAGNOSIS — J302 Other seasonal allergic rhinitis: Secondary | ICD-10-CM | POA: Diagnosis not present

## 2024-07-07 ENCOUNTER — Encounter: Payer: Self-pay | Admitting: Family Medicine

## 2024-07-08 ENCOUNTER — Ambulatory Visit

## 2024-07-08 DIAGNOSIS — J302 Other seasonal allergic rhinitis: Secondary | ICD-10-CM | POA: Diagnosis not present

## 2024-07-08 DIAGNOSIS — J309 Allergic rhinitis, unspecified: Secondary | ICD-10-CM

## 2024-07-11 ENCOUNTER — Ambulatory Visit

## 2024-07-11 DIAGNOSIS — J302 Other seasonal allergic rhinitis: Secondary | ICD-10-CM | POA: Diagnosis not present

## 2024-07-11 DIAGNOSIS — J309 Allergic rhinitis, unspecified: Secondary | ICD-10-CM

## 2024-07-15 ENCOUNTER — Ambulatory Visit

## 2024-07-15 DIAGNOSIS — J302 Other seasonal allergic rhinitis: Secondary | ICD-10-CM | POA: Diagnosis not present

## 2024-07-15 DIAGNOSIS — J309 Allergic rhinitis, unspecified: Secondary | ICD-10-CM

## 2024-07-18 ENCOUNTER — Ambulatory Visit (INDEPENDENT_AMBULATORY_CARE_PROVIDER_SITE_OTHER)

## 2024-07-18 DIAGNOSIS — J302 Other seasonal allergic rhinitis: Secondary | ICD-10-CM | POA: Diagnosis not present

## 2024-07-18 DIAGNOSIS — J309 Allergic rhinitis, unspecified: Secondary | ICD-10-CM

## 2024-07-22 ENCOUNTER — Ambulatory Visit

## 2024-07-22 DIAGNOSIS — J309 Allergic rhinitis, unspecified: Secondary | ICD-10-CM

## 2024-07-22 DIAGNOSIS — J302 Other seasonal allergic rhinitis: Secondary | ICD-10-CM | POA: Diagnosis not present

## 2024-07-28 ENCOUNTER — Ambulatory Visit

## 2024-07-28 DIAGNOSIS — J302 Other seasonal allergic rhinitis: Secondary | ICD-10-CM | POA: Diagnosis not present

## 2024-07-28 DIAGNOSIS — J309 Allergic rhinitis, unspecified: Secondary | ICD-10-CM

## 2024-08-01 ENCOUNTER — Ambulatory Visit (INDEPENDENT_AMBULATORY_CARE_PROVIDER_SITE_OTHER)

## 2024-08-01 DIAGNOSIS — J302 Other seasonal allergic rhinitis: Secondary | ICD-10-CM

## 2024-08-01 DIAGNOSIS — J309 Allergic rhinitis, unspecified: Secondary | ICD-10-CM

## 2024-08-04 DIAGNOSIS — J302 Other seasonal allergic rhinitis: Secondary | ICD-10-CM

## 2024-08-04 DIAGNOSIS — J309 Allergic rhinitis, unspecified: Secondary | ICD-10-CM

## 2024-08-06 ENCOUNTER — Other Ambulatory Visit: Payer: Self-pay

## 2024-08-06 ENCOUNTER — Other Ambulatory Visit: Payer: Self-pay | Admitting: Internal Medicine

## 2024-08-06 ENCOUNTER — Ambulatory Visit (INDEPENDENT_AMBULATORY_CARE_PROVIDER_SITE_OTHER): Admitting: Internal Medicine

## 2024-08-06 ENCOUNTER — Ambulatory Visit

## 2024-08-06 VITALS — BP 138/78 | HR 75 | Temp 98.0°F | Ht 64.0 in | Wt 195.8 lb

## 2024-08-06 DIAGNOSIS — J302 Other seasonal allergic rhinitis: Secondary | ICD-10-CM

## 2024-08-06 DIAGNOSIS — Z91018 Allergy to other foods: Secondary | ICD-10-CM | POA: Diagnosis not present

## 2024-08-06 DIAGNOSIS — J4540 Moderate persistent asthma, uncomplicated: Secondary | ICD-10-CM | POA: Diagnosis not present

## 2024-08-06 DIAGNOSIS — J3089 Other allergic rhinitis: Secondary | ICD-10-CM

## 2024-08-06 DIAGNOSIS — K219 Gastro-esophageal reflux disease without esophagitis: Secondary | ICD-10-CM

## 2024-08-06 MED ORDER — ALBUTEROL SULFATE HFA 108 (90 BASE) MCG/ACT IN AERS: 1.0000 | INHALATION_SPRAY | Freq: Four times a day (QID) | RESPIRATORY_TRACT | 1 refills | Status: AC | PRN

## 2024-08-06 MED ORDER — AZELASTINE HCL 0.1 % NA SOLN: 2.0000 | Freq: Two times a day (BID) | NASAL | 5 refills | Status: AC | PRN

## 2024-08-06 MED ORDER — ALBUTEROL SULFATE (2.5 MG/3ML) 0.083% IN NEBU: 2.5000 mg | INHALATION_SOLUTION | Freq: Four times a day (QID) | RESPIRATORY_TRACT | 1 refills | Status: DC | PRN

## 2024-08-06 MED ORDER — MONTELUKAST SODIUM 10 MG PO TABS: 10.0000 mg | ORAL_TABLET | Freq: Every day | ORAL | 1 refills | Status: AC

## 2024-08-06 MED ORDER — FLUTICASONE PROPIONATE 50 MCG/ACT NA SUSP: 2.0000 | Freq: Every day | NASAL | 5 refills | Status: AC

## 2024-08-06 MED ORDER — ESOMEPRAZOLE MAGNESIUM 20 MG PO CPDR: 20.0000 mg | DELAYED_RELEASE_CAPSULE | Freq: Every day | ORAL | 1 refills | Status: AC | PRN

## 2024-08-06 MED ORDER — CETIRIZINE HCL 10 MG PO TABS: 10.0000 mg | ORAL_TABLET | Freq: Every day | ORAL | 1 refills | Status: AC

## 2024-08-06 MED ORDER — BREZTRI AEROSPHERE 160-9-4.8 MCG/ACT IN AERO: 2.0000 | INHALATION_SPRAY | Freq: Two times a day (BID) | RESPIRATORY_TRACT | 1 refills | Status: AC

## 2024-08-06 NOTE — Progress Notes (Signed)
 "  FOLLOW UP Date of Service/Encounter:  08/06/2024   Subjective:  Sarah Garrett (DOB: 12-13-1960) is a 64 y.o. female who [facility] for follow up for asthma, allergic rhinitis, alpha gal, GERD.    History obtained from: chart review and patient. Last visit was on 05/06/2024 Asthma- controlled on Breztri  + Singulair  AR- uncontrolled, consider AIT GERD- controlled on daily PPI. Hoarseness/Voice changes- f/u with ENT; speech therapy exercises   Notes asthma has done well since last visit.  Taking Breztri  and Singulair . Has not needed her albuterol . No ER/urgent care/oral prednisone  use.  Allergies are also doing well with not as much congestion, drainage, runny nose. No ocular symptoms.  Doing well with allergy  shots, started in November.  Has an Epipen .    Avoiding mammalian meat, would be interested in reintroduction if alpha gal is negative.  Reflux is doing pretty well. Only has symptoms if she eats things that flare it.  Takes PPI few times a week.   Past Medical History: Past Medical History:  Diagnosis Date   Anemia    Diverticulosis    GERD (gastroesophageal reflux disease)    H/O adenoidectomy    Hemochromatosis 07/07/2011   Hyperlipidemia    Hypertension    Melanoma (HCC)    right shoulder   Thyroid  disease     Objective:  BP 138/78 (BP Location: Right Arm, Patient Position: Sitting, Cuff Size: Normal)   Pulse 75   Temp 98 F (36.7 C) (Temporal)   Ht 5' 4 (1.626 m)   Wt 195 lb 12.8 oz (88.8 kg)   LMP 11/29/2011   SpO2 99%   BMI 33.61 kg/m  Body mass index is 33.61 kg/m. Physical Exam: GEN: alert, well developed HEENT: clear conjunctiva, nose with mild inferior turbinate hypertrophy, pink nasal mucosa, no rhinorrhea, + cobblestoning HEART: regular rate and rhythm, no murmur LUNGS: clear to auscultation bilaterally, no coughing, unlabored respiration SKIN: no rashes or lesions  Spirometry:  Tracings  reviewed. Her effort: Good reproducible efforts. FVC: 2.18L, 72% predicted  FEV1: 1.56L, 67% predicted FEV1/FVC ratio: 72% Interpretation: Spirometry consistent with normal pattern.  Please see scanned spirometry results for details.  Assessment:   1. Seasonal and perennial allergic rhinitis   2. Gastroesophageal reflux disease, unspecified whether esophagitis present   3. Moderate persistent asthma without complication   4. Allergy  to alpha-gal     Plan/Recommendations:   Moderate Persistent Asthma - Well controlled with normal spirometry.  Will consider de-escalation at next visit if still doing well.   - Maintenance inhaler: Continue Breztri  160-9-4.75mcg 2 puffs twice daily with spacer. Continue Singulair  10mg  daily  - Rescue inhaler: Albuterol  2 puffs via spacer or 1 vial via nebulizer every 4-6 hours as needed for respiratory symptoms of cough, shortness of breath, or wheezing Asthma control goals:  Full participation in all desired activities (may need albuterol  before activity) Albuterol  use two times or less a week on average (not counting use with activity) Cough interfering with sleep two times or less a month Oral steroids no more than once a year No hospitalizations   Allergic Rhinitis: - Controlled.  - Positive skin test 11/2022: trees, ragweed, mold, cats, dogs, dust mite, mouse - Use nasal saline rinses before nose sprays such as with Neilmed Sinus Rinse.  Use distilled water .   - If symptoms worsen, use Flonase  2 sprays each nostril daily. Aim upward and outward. - Use Azelastine  1 spray each nostril twice daily as  needed for congestion, runny nose, sneezing.  Aim upward and outward.   - Use Zyrtec  10 mg daily.  - Use Singulair  10mg  daily. Stop if there are any mood/behavioral changes. - Continue allergy  shots.  Keep Epipen  with you for each shot visit.  Initiated 05/2024.   GERD - Controlled. - Continue Nexium  20mg  every other daily.  Take on empty stomach in  the morning and eat a small meal afterwards. -Avoid lying down for at least two hours after a meal or after drinking acidic beverages, like soda, or other caffeinated beverages. This can help to prevent stomach contents from flowing back into the esophagus. -Keep your head elevated while you sleep. Using an extra pillow or two can also help to prevent reflux. -Eat smaller and more frequent meals each day instead of a few large meals. This promotes digestion and can aid in preventing heartburn. -Wear loose-fitting clothes to ease pressure on the stomach, which can worsen heartburn and reflux. -Reduce excess weight around the midsection. This can ease pressure on the stomach. Such pressure can force some stomach contents back up the esophagus.   Alpha Gal Allergy  - please strictly avoid mammalian meat.  Will recheck levels today, if negative will plan for challenge in clinic to help with reintroduction. - initial rxn: recurrent hives around 2020 that resolved with mammalian meat avoidance  - sIgE 06/2023: alpha gal 0.63 - for SKIN only reaction, okay to take Zyrtec  10mg  every 12 hours as needed.  - for SKIN + ANY additional symptoms, OR IF concern for LIFE THREATENING reaction = Epipen  Autoinjector EpiPen  0.3 mg. - If using Epinephrine  autoinjector, call 911  or go to the ER.   Dysphagia/Voice Changes/Hoarseness  - Continue speech therapy exercises.  If worsening, ENT will consider vocal cord augmentation.     Return in about 6 months (around 02/03/2025).  Arleta Blanch, MD [facility]       "

## 2024-08-06 NOTE — Addendum Note (Signed)
 Addended by: Nao Linz on: 08/06/2024 04:31 PM   Modules accepted: Orders

## 2024-08-06 NOTE — Patient Instructions (Addendum)
 Moderate Persistent Asthma - Maintenance inhaler: Continue Breztri  160-9-4.62mcg 2 puffs twice daily with spacer. Continue Singulair  10mg  daily  - Rescue inhaler: Albuterol  2 puffs via spacer or 1 vial via nebulizer every 4-6 hours as needed for respiratory symptoms of cough, shortness of breath, or wheezing Asthma control goals:  Full participation in all desired activities (may need albuterol  before activity) Albuterol  use two times or less a week on average (not counting use with activity) Cough interfering with sleep two times or less a month Oral steroids no more than once a year No hospitalizations   Allergic Rhinitis: - Positive skin test 11/2022: trees, ragweed, mold, cats, dogs, dust mite, mouse - Use nasal saline rinses before nose sprays such as with Neilmed Sinus Rinse.  Use distilled water .   - If symptoms worsen, use Flonase  2 sprays each nostril daily. Aim upward and outward. - Use Azelastine  1 spray each nostril twice daily as needed for congestion, runny nose, sneezing.  Aim upward and outward.   - Use Zyrtec  10 mg daily.  - Use Singulair  10mg  daily. Stop if there are any mood/behavioral changes. - Continue allergy  shots.  Keep Epipen  with you for each shot visit.   GERD - Continue Nexium  20mg  every other daily.  Take on empty stomach in the morning and eat a small meal afterwards. -Avoid lying down for at least two hours after a meal or after drinking acidic beverages, like soda, or other caffeinated beverages. This can help to prevent stomach contents from flowing back into the esophagus. -Keep your head elevated while you sleep. Using an extra pillow or two can also help to prevent reflux. -Eat smaller and more frequent meals each day instead of a few large meals. This promotes digestion and can aid in preventing heartburn. -Wear loose-fitting clothes to ease pressure on the stomach, which can worsen heartburn and reflux. -Reduce excess weight around the midsection. This  can ease pressure on the stomach. Such pressure can force some stomach contents back up the esophagus.   Alpha Gal Allergy  - please strictly avoid mammalian meat.  - for SKIN only reaction, okay to take Zyrtec  10mg  every 12 hours as needed.  - for SKIN + ANY additional symptoms, OR IF concern for LIFE THREATENING reaction = Epipen  Autoinjector EpiPen  0.3 mg. - If using Epinephrine  autoinjector, call 911  or go to the ER.   Dysphagia/Voice Changes/Hoarseness  - Continue speech therapy exercises.  If worsening, ENT will consider vocal cord augmentation.

## 2024-08-08 ENCOUNTER — Ambulatory Visit: Payer: Self-pay | Admitting: Internal Medicine

## 2024-08-08 LAB — ALPHA-GAL PANEL
Allergen Lamb IgE: 2.94 kU/L — AB
Beef IgE: 1.89 kU/L — AB
IgE (Immunoglobulin E), Serum: 2673 [IU]/mL — ABNORMAL HIGH (ref 6–495)
O215-IgE Alpha-Gal: 0.49 kU/L — AB
Pork IgE: 4.7 kU/L — AB

## 2024-08-11 ENCOUNTER — Ambulatory Visit
Admission: RE | Admit: 2024-08-11 | Discharge: 2024-08-11 | Disposition: A | Source: Ambulatory Visit | Attending: Family Medicine | Admitting: Family Medicine

## 2024-08-11 ENCOUNTER — Ambulatory Visit

## 2024-08-11 DIAGNOSIS — Z1231 Encounter for screening mammogram for malignant neoplasm of breast: Secondary | ICD-10-CM

## 2024-08-11 DIAGNOSIS — J302 Other seasonal allergic rhinitis: Secondary | ICD-10-CM

## 2024-08-14 ENCOUNTER — Ambulatory Visit

## 2024-08-14 DIAGNOSIS — J302 Other seasonal allergic rhinitis: Secondary | ICD-10-CM | POA: Diagnosis not present

## 2024-08-18 ENCOUNTER — Ambulatory Visit

## 2024-08-18 DIAGNOSIS — J302 Other seasonal allergic rhinitis: Secondary | ICD-10-CM

## 2024-08-21 ENCOUNTER — Ambulatory Visit

## 2024-08-21 DIAGNOSIS — J302 Other seasonal allergic rhinitis: Secondary | ICD-10-CM

## 2024-08-27 ENCOUNTER — Ambulatory Visit

## 2024-08-27 ENCOUNTER — Encounter: Admitting: Family Medicine

## 2024-08-27 ENCOUNTER — Other Ambulatory Visit: Payer: Self-pay | Admitting: Family Medicine

## 2024-08-27 DIAGNOSIS — J302 Other seasonal allergic rhinitis: Secondary | ICD-10-CM

## 2024-08-27 DIAGNOSIS — E7800 Pure hypercholesterolemia, unspecified: Secondary | ICD-10-CM

## 2024-08-29 ENCOUNTER — Ambulatory Visit

## 2024-08-29 DIAGNOSIS — J302 Other seasonal allergic rhinitis: Secondary | ICD-10-CM | POA: Diagnosis not present

## 2024-09-02 ENCOUNTER — Ambulatory Visit

## 2024-09-02 DIAGNOSIS — J302 Other seasonal allergic rhinitis: Secondary | ICD-10-CM | POA: Diagnosis not present

## 2024-09-16 ENCOUNTER — Encounter: Admitting: Family Medicine

## 2024-11-26 ENCOUNTER — Ambulatory Visit: Admitting: Family Medicine

## 2025-02-03 ENCOUNTER — Ambulatory Visit: Admitting: Allergy and Immunology
